# Patient Record
Sex: Male | Born: 1945
Health system: Southern US, Community
[De-identification: ages and names within clinical notes are randomized; demographics above are authoritative.]

## PROBLEM LIST (undated history)

## (undated) DIAGNOSIS — F419 Anxiety disorder, unspecified: Secondary | ICD-10-CM

## (undated) DIAGNOSIS — J45909 Unspecified asthma, uncomplicated: Secondary | ICD-10-CM

## (undated) DIAGNOSIS — H269 Unspecified cataract: Secondary | ICD-10-CM

## (undated) DIAGNOSIS — T7840XA Allergy, unspecified, initial encounter: Secondary | ICD-10-CM

## (undated) DIAGNOSIS — K219 Gastro-esophageal reflux disease without esophagitis: Secondary | ICD-10-CM

## (undated) DIAGNOSIS — M1990 Unspecified osteoarthritis, unspecified site: Secondary | ICD-10-CM

## (undated) DIAGNOSIS — E039 Hypothyroidism, unspecified: Secondary | ICD-10-CM

## (undated) DIAGNOSIS — I213 ST elevation (STEMI) myocardial infarction of unspecified site: Secondary | ICD-10-CM

## (undated) DIAGNOSIS — I2510 Atherosclerotic heart disease of native coronary artery without angina pectoris: Secondary | ICD-10-CM

## (undated) DIAGNOSIS — E785 Hyperlipidemia, unspecified: Secondary | ICD-10-CM

## (undated) HISTORY — DX: Gastro-esophageal reflux disease without esophagitis: K21.9

## (undated) HISTORY — DX: Unspecified cataract: H26.9

## (undated) HISTORY — PX: OTHER SURGICAL HISTORY: SHX169

## (undated) HISTORY — PX: UPPER GASTROINTESTINAL ENDOSCOPY: SHX188

## (undated) HISTORY — DX: Unspecified osteoarthritis, unspecified site: M19.90

## (undated) HISTORY — PX: POLYPECTOMY: SHX149

## (undated) HISTORY — DX: Anxiety disorder, unspecified: F41.9

## (undated) HISTORY — PX: EYE SURGERY: SHX253

## (undated) HISTORY — PX: SPINE SURGERY: SHX786

## (undated) HISTORY — DX: Unspecified asthma, uncomplicated: J45.909

## (undated) HISTORY — DX: Allergy, unspecified, initial encounter: T78.40XA

## (undated) HISTORY — DX: Hypothyroidism, unspecified: E03.9

## (undated) HISTORY — PX: TONSILLECTOMY: SUR1361

## (undated) HISTORY — PX: SHOULDER SURGERY: SHX246

---

## 2004-05-31 HISTORY — PX: COLONOSCOPY: SHX5424

## 2004-10-25 ENCOUNTER — Ambulatory Visit: Payer: Self-pay | Admitting: Family Medicine

## 2004-10-31 ENCOUNTER — Encounter: Admission: RE | Admit: 2004-10-31 | Discharge: 2004-10-31 | Payer: Self-pay | Admitting: Orthopaedic Surgery

## 2004-11-26 ENCOUNTER — Encounter: Admission: RE | Admit: 2004-11-26 | Discharge: 2005-02-18 | Payer: Self-pay | Admitting: Orthopaedic Surgery

## 2005-09-19 ENCOUNTER — Ambulatory Visit: Payer: Self-pay | Admitting: Family Medicine

## 2005-09-26 ENCOUNTER — Ambulatory Visit: Payer: Self-pay | Admitting: Family Medicine

## 2005-09-30 ENCOUNTER — Ambulatory Visit: Payer: Self-pay | Admitting: Family Medicine

## 2005-10-03 ENCOUNTER — Ambulatory Visit: Payer: Self-pay | Admitting: Family Medicine

## 2005-10-14 ENCOUNTER — Ambulatory Visit: Payer: Self-pay | Admitting: Gastroenterology

## 2005-10-22 ENCOUNTER — Encounter (INDEPENDENT_AMBULATORY_CARE_PROVIDER_SITE_OTHER): Payer: Self-pay | Admitting: *Deleted

## 2005-10-22 ENCOUNTER — Ambulatory Visit: Payer: Self-pay | Admitting: Gastroenterology

## 2005-11-20 ENCOUNTER — Ambulatory Visit: Payer: Self-pay | Admitting: Gastroenterology

## 2005-12-02 ENCOUNTER — Ambulatory Visit (HOSPITAL_COMMUNITY): Admission: RE | Admit: 2005-12-02 | Discharge: 2005-12-02 | Payer: Self-pay | Admitting: Gastroenterology

## 2005-12-16 ENCOUNTER — Ambulatory Visit: Payer: Self-pay | Admitting: Gastroenterology

## 2006-04-23 ENCOUNTER — Ambulatory Visit: Payer: Self-pay | Admitting: Gastroenterology

## 2007-03-11 DIAGNOSIS — E039 Hypothyroidism, unspecified: Secondary | ICD-10-CM | POA: Insufficient documentation

## 2007-03-11 DIAGNOSIS — F418 Other specified anxiety disorders: Secondary | ICD-10-CM | POA: Insufficient documentation

## 2007-03-11 DIAGNOSIS — F411 Generalized anxiety disorder: Secondary | ICD-10-CM

## 2007-03-11 DIAGNOSIS — F39 Unspecified mood [affective] disorder: Secondary | ICD-10-CM | POA: Insufficient documentation

## 2007-03-11 DIAGNOSIS — J309 Allergic rhinitis, unspecified: Secondary | ICD-10-CM | POA: Insufficient documentation

## 2007-03-24 ENCOUNTER — Ambulatory Visit: Payer: Self-pay | Admitting: Family Medicine

## 2007-03-24 LAB — CONVERTED CEMR LAB
ALT: 23 units/L (ref 0–53)
AST: 19 units/L (ref 0–37)
Alkaline Phosphatase: 71 units/L (ref 39–117)
Basophils Absolute: 0 10*3/uL (ref 0.0–0.1)
Basophils Relative: 0.6 % (ref 0.0–1.0)
Bilirubin Urine: NEGATIVE
CO2: 32 meq/L (ref 19–32)
Cholesterol: 172 mg/dL (ref 0–200)
Eosinophils Absolute: 0.1 10*3/uL (ref 0.0–0.6)
Eosinophils Relative: 2.4 % (ref 0.0–5.0)
GFR calc non Af Amer: 91 mL/min
HDL: 36.2 mg/dL — ABNORMAL LOW (ref >39.0)
Hemoglobin, Urine: NEGATIVE
Ketones, ur: NEGATIVE mg/dL
Leukocytes, UA: NEGATIVE
Lymphocytes Relative: 21.4 % (ref 12.0–46.0)
Monocytes Relative: 7.1 % (ref 3.0–11.0)
Neutro Abs: 3.5 10*3/uL (ref 1.4–7.7)
Nitrite: NEGATIVE
Platelets: 243 10*3/uL (ref 150–400)
Potassium: 4.3 meq/L (ref 3.5–5.1)
RDW: 12.2 % (ref 11.5–14.6)
TSH: 2.54 microintl units/mL (ref 0.35–5.50)
Total Bilirubin: 0.7 mg/dL (ref 0.3–1.2)
Total Protein, Urine: NEGATIVE mg/dL
Urine Glucose: NEGATIVE mg/dL
Urobilinogen, UA: 0.2 (ref 0.0–1.0)
VLDL: 15 mg/dL (ref 0–40)

## 2007-04-02 ENCOUNTER — Ambulatory Visit: Payer: Self-pay | Admitting: Family Medicine

## 2007-04-20 ENCOUNTER — Ambulatory Visit: Payer: Self-pay | Admitting: Family Medicine

## 2007-04-20 DIAGNOSIS — D235 Other benign neoplasm of skin of trunk: Secondary | ICD-10-CM

## 2007-04-20 DIAGNOSIS — C4490 Unspecified malignant neoplasm of skin, unspecified: Secondary | ICD-10-CM

## 2007-09-11 ENCOUNTER — Telehealth: Payer: Self-pay | Admitting: Family Medicine

## 2007-09-16 ENCOUNTER — Encounter: Payer: Self-pay | Admitting: Family Medicine

## 2008-01-04 ENCOUNTER — Encounter: Payer: Self-pay | Admitting: Family Medicine

## 2008-01-04 ENCOUNTER — Ambulatory Visit: Payer: Self-pay | Admitting: Family Medicine

## 2008-01-05 DIAGNOSIS — L03319 Cellulitis of trunk, unspecified: Secondary | ICD-10-CM

## 2008-01-05 DIAGNOSIS — L02219 Cutaneous abscess of trunk, unspecified: Secondary | ICD-10-CM

## 2008-01-09 ENCOUNTER — Telehealth: Payer: Self-pay | Admitting: Family Medicine

## 2008-02-11 ENCOUNTER — Telehealth: Payer: Self-pay | Admitting: Family Medicine

## 2008-06-23 ENCOUNTER — Telehealth: Payer: Self-pay | Admitting: Family Medicine

## 2009-02-14 ENCOUNTER — Ambulatory Visit: Payer: Self-pay | Admitting: Family Medicine

## 2009-02-14 LAB — CONVERTED CEMR LAB
Alkaline Phosphatase: 62 units/L (ref 39–117)
Bilirubin Urine: NEGATIVE
Calcium: 9.3 mg/dL (ref 8.4–10.5)
Chloride: 107 meq/L (ref 96–112)
Cholesterol: 189 mg/dL (ref 0–200)
Eosinophils Relative: 2.3 % (ref 0.0–5.0)
GFR calc non Af Amer: 79.87 mL/min (ref >60)
Glucose, Bld: 100 mg/dL — ABNORMAL HIGH (ref 70–99)
HCT: 41.8 % (ref 39.0–52.0)
Hemoglobin, Urine: NEGATIVE
Ketones, ur: NEGATIVE mg/dL
LDL Cholesterol: 129 mg/dL — ABNORMAL HIGH (ref 0–99)
Lymphocytes Relative: 24.8 % (ref 12.0–46.0)
Lymphs Abs: 1.3 10*3/uL (ref 0.7–4.0)
MCHC: 34.8 g/dL (ref 30.0–36.0)
Monocytes Absolute: 0.4 10*3/uL (ref 0.1–1.0)
Neutro Abs: 3.5 10*3/uL (ref 1.4–7.7)
Platelets: 220 10*3/uL (ref 150.0–400.0)
Potassium: 4.4 meq/L (ref 3.5–5.1)
Specific Gravity, Urine: 1.02 (ref 1.000–1.030)
Total Bilirubin: 0.6 mg/dL (ref 0.3–1.2)
Total Protein, Urine: NEGATIVE mg/dL
Triglycerides: 83 mg/dL (ref 0.0–149.0)
Urine Glucose: NEGATIVE mg/dL
Urobilinogen, UA: 0.2 (ref 0.0–1.0)
VLDL: 16.6 mg/dL (ref 0.0–40.0)
pH: 6 (ref 5.0–8.0)

## 2009-02-21 ENCOUNTER — Ambulatory Visit: Payer: Self-pay | Admitting: Family Medicine

## 2009-03-28 ENCOUNTER — Ambulatory Visit: Payer: Self-pay | Admitting: Family Medicine

## 2009-04-18 DIAGNOSIS — L820 Inflamed seborrheic keratosis: Secondary | ICD-10-CM

## 2009-05-12 ENCOUNTER — Telehealth: Payer: Self-pay | Admitting: Family Medicine

## 2009-07-05 ENCOUNTER — Encounter: Payer: Self-pay | Admitting: Family Medicine

## 2009-09-26 ENCOUNTER — Ambulatory Visit: Payer: Self-pay | Admitting: Family Medicine

## 2009-09-26 DIAGNOSIS — J45909 Unspecified asthma, uncomplicated: Secondary | ICD-10-CM | POA: Insufficient documentation

## 2009-10-05 ENCOUNTER — Telehealth: Payer: Self-pay | Admitting: Family Medicine

## 2009-11-03 ENCOUNTER — Ambulatory Visit: Payer: Self-pay | Admitting: Family Medicine

## 2009-11-03 ENCOUNTER — Telehealth: Payer: Self-pay | Admitting: Family Medicine

## 2009-11-09 ENCOUNTER — Ambulatory Visit: Payer: Self-pay | Admitting: Family Medicine

## 2009-11-09 DIAGNOSIS — K219 Gastro-esophageal reflux disease without esophagitis: Secondary | ICD-10-CM | POA: Insufficient documentation

## 2009-12-27 ENCOUNTER — Telehealth: Payer: Self-pay | Admitting: Family Medicine

## 2010-01-24 ENCOUNTER — Ambulatory Visit: Payer: Self-pay | Admitting: Family Medicine

## 2010-01-25 ENCOUNTER — Ambulatory Visit: Payer: Self-pay | Admitting: Family Medicine

## 2010-02-02 ENCOUNTER — Telehealth: Payer: Self-pay | Admitting: Family Medicine

## 2010-03-20 ENCOUNTER — Telehealth: Payer: Self-pay | Admitting: Family Medicine

## 2010-09-02 LAB — CONVERTED CEMR LAB
AST: 24 units/L (ref 0–37)
BUN: 21 mg/dL (ref 6–23)
Creatinine, Ser: 0.9 mg/dL (ref 0.4–1.5)
Eosinophils Absolute: 0.1 10*3/uL (ref 0.0–0.7)
Eosinophils Relative: 2.8 % (ref 0.0–5.0)
HDL: 41.9 mg/dL (ref >39.00)
LDL Cholesterol: 118 mg/dL — ABNORMAL HIGH (ref 0–99)
Lymphocytes Relative: 22.2 % (ref 12.0–46.0)
Lymphs Abs: 1.2 10*3/uL (ref 0.7–4.0)
MCHC: 34.3 g/dL (ref 30.0–36.0)
Monocytes Absolute: 0.4 10*3/uL (ref 0.1–1.0)
Monocytes Relative: 7.6 % (ref 3.0–12.0)
Neutrophils Relative %: 66.5 % (ref 43.0–77.0)
PSA: 1.42 ng/mL (ref 0.10–4.00)
Platelets: 248 10*3/uL (ref 150.0–400.0)
Potassium: 4.8 meq/L (ref 3.5–5.1)
Sodium: 143 meq/L (ref 135–145)
TSH: 2.94 microintl units/mL (ref 0.35–5.50)
Total Bilirubin: 0.8 mg/dL (ref 0.3–1.2)
Total Protein: 6.6 g/dL (ref 6.0–8.3)
Urobilinogen, UA: 0.2
VLDL: 15 mg/dL (ref 0.0–40.0)
WBC Urine, dipstick: NEGATIVE
WBC: 5.2 10*3/uL (ref 4.5–10.5)
pH: 6.5

## 2010-09-04 NOTE — Progress Notes (Signed)
Summary: FYI to Dr Tawanna Cooler  Phone Note Call from Patient Call back at [facility] Phone [phone]   Caller: Spouse----LIVE CALL Reason for Call: Talk to Doctor Summary of Call: Was seen 2 weeks ago and was given 3 rxs. The same 3 were sent to [facility]. [facility] mailed the rxs to the patient and now [facility] will not take them back. Wife said that they are stuck with extra meds and the extra charges. She just wanted to let Dr Tawanna Cooler know about this. Any ?, please call the wife. Thanks. Initial call taken by: Warnell Forester,  October 05, 2009 10:34 AM  Follow-up for Phone Call        [facility] please call [name]......... not the wife..........Marland Kitchen medications have a shelf life for one year plus okay to hold them.  He may need in the future Follow-up by: Roderick Pee MD,  October 05, 2009 12:47 PM  Additional Follow-up for Phone Call Additional follow up Details #1::        spoke with patient and he is aware Additional Follow-up by: Kern Reap CMA Duncan Dull),  October 05, 2009 5:10 PM

## 2010-09-04 NOTE — Assessment & Plan Note (Signed)
Summary: cough/cb   Vital Signs:  Patient profile:   65 year old male Weight:      223 pounds Temp:     98.7 degrees F oral BP sitting:   110 / 80  (left arm) Cuff size:   regular  Vitals Entered By: Kern Reap CMA Duncan Dull) (November 03, 2009 12:49 PM) CC: chest congestion   CC:  chest congestion.  History of Present Illness: Ryan [name] is a 65 year old male, who comes in today for reevaluation of asthma.  We saw him 6 weeks ago with a flare of his asthma.  At that time.  He also has some discolored sputum.  We started him on doxycycline 100 mg b.i.d., prednisone, 40 mg daily x 3 days with the taper and he markedly improved.  However, the cough has never gone completely away.  He has no fever, chills, sputum production, shortness of breath, et Karie Soda.  He also states he thinks his reflexes gotten worse.  He took 14 days and OTC Prilosec and is reflux got better, but it didn't seem to help the cough.  He states when he lies down at night.  It makes her cough worse therefore, there may be an element of reflux-induced cough.  Review of systems otherwise negative  Allergies: 1)  ! Penicillin 2)  ! * Ivp Dye 3)  ! Erythromycin  Past History:  Past medical, surgical, family and social histories (including risk factors) reviewed for relevance to current acute and chronic problems.  Past Medical History: Reviewed history from 04/02/2007 and no changes required. Allergic rhinitis Anxiety Hypothyroidism  Past Surgical History: Reviewed history from 03/11/2007 and no changes required. EDG-10/22/2005 Colonoscopy-05/31/2004  Family History: Reviewed history from 03/11/2007 and no changes required. Family History Diabetes 1st degree relative Family History Lung cancer Fam hx CHF  Social History: Reviewed history from 04/02/2007 and no changes required. Married Regular exercise-yes  Review of Systems      See HPI  Physical Exam  General:  Well-developed,well-nourished,in no  acute distress; alert,appropriate and cooperative throughout examination Head:  Normocephalic and atraumatic without obvious abnormalities. No apparent alopecia or balding. Eyes:  No corneal or conjunctival inflammation noted. EOMI. Perrla. Funduscopic exam benign, without hemorrhages, exudates or papilledema. Vision grossly normal. Ears:  External ear exam shows no significant lesions or deformities.  Otoscopic examination reveals clear canals, tympanic membranes are intact bilaterally without bulging, retraction, inflammation or discharge. Hearing is grossly normal bilaterally. Nose:  External nasal examination shows no deformity or inflammation. Nasal mucosa are pink and moist without lesions or exudates. Mouth:  Oral mucosa and oropharynx without lesions or exudates.  Teeth in good repair. Neck:  No deformities, masses, or tenderness noted. Lungs:  symmetrical breath sounds, late expiratory 1+ wheezing   Impression & Recommendations:  Problem # 1:  ASTHMA (ICD-493.90) Assessment Unchanged  His updated medication list for this problem includes:    Prednisone 20 Mg Tabs (Prednisone) ..... Uad    Qvar 40 Mcg/act Aers (Beclomethasone dipropionate) .Marland Kitchen... 2 ps two times a day  Orders: T-2 View CXR (71020TC)  Complete Medication List: 1)  Synthroid 88 Mcg Tabs (Levothyroxine sodium) .... Take 1 tablet by mouth once a day 2)  Aspirin 325 Mg Tabs (Aspirin) .... Take 1 tablet by mouth once a day 3)  Prednisone 20 Mg Tabs (Prednisone) .... Uad 4)  Doxycycline Hyclate 100 Mg Caps (Doxycycline hyclate) .... Take 1 tablet by mouth two times a day 5)  Qvar 40 Mcg/act Aers (Beclomethasone dipropionate) .Marland KitchenMarland KitchenMarland Kitchen  2 ps two times a day  Patient Instructions: 1)  take OTC Prilosec, one twice daily, nothing to eat or drink for 3 hours before bedtime, except water. 2)  Begin Qvar 2 puffs twice daily........ swish and spit with mouthwash after a use the inhaled steroid. 3)  Go  to the main office now for a  chest x-ray 4)  Return next Thursday for follow-up Prescriptions: QVAR 40 MCG/ACT AERS (BECLOMETHASONE DIPROPIONATE) 2 ps two times a day  #1 x 2   Entered and Authorized by:   Roderick Pee MD   Signed by:   Roderick Pee MD on 11/03/2009   Method used:   Print then Give to Patient   RxID:   [id number] QVAR 40 MCG/ACT AERS (BECLOMETHASONE DIPROPIONATE) 2 ps two times a day  #1 x 2   Entered and Authorized by:   Roderick Pee MD   Signed by:   Roderick Pee MD on 11/03/2009   Method used:   Electronically to        [facility]* (mail-order)             ,          Ph: [phone]       Fax: [phone]   RxID:   [id number]

## 2010-09-04 NOTE — Progress Notes (Signed)
Summary: shingles shot call  Phone Note Outgoing Call   Call placed by: Duard Brady LPN,  March 20, 2010 2:54 PM Call placed to: Patient Summary of Call: spoke with pt r/t shingles vaccice - need to check with ins co about covering the $290 cost. Will have to sign a wavier of liability. call by thursday 5pm to let us know. If we dont hear from him , will remove from list. KIK Initial call taken by: Duard Brady LPN,  March 20, 2010 2:56 PM  Follow-up for Phone Call        pt write out rx for the shingles vax. pt will pick up when ready. Follow-up by: Warnell Forester,  March 20, 2010 4:38 PM    New/Updated Medications: ZOSTAVAX 57846 UNT/0.65ML SOLR (ZOSTER VACCINE LIVE) use as directed Prescriptions: ZOSTAVAX 96295 UNT/0.65ML SOLR (ZOSTER VACCINE LIVE) use as directed  #1 x 0   Entered by:   Kern Reap CMA (AAMA)   Authorized by:   Roderick Pee MD   Signed by:   Kern Reap CMA (AAMA) on 03/21/2010   Method used:   Print then Give to Patient   RxID:   [id number]

## 2010-09-04 NOTE — Miscellaneous (Signed)
Summary: Consent for Mole Removal  Consent for Mole Removal   Imported By: Maryln Gottron 01/29/2010 13:36:26  _____________________________________________________________________  External Attachment:    Type:   Image     Comment:   External Document

## 2010-09-04 NOTE — Assessment & Plan Note (Signed)
Summary: CONGESTION // RS   Vital Signs:  Patient profile:   65 year old male Weight:      226 pounds Temp:     100.7 degrees F oral BP sitting:   142 / 92  (left arm) Cuff size:   regular  Vitals Entered By: Kern Reap CMA Duncan Dull) (September 26, 2009 12:02 PM)  Reason for Visit chest congestion, cough, chills  History of Present Illness: Ryan [name] is a 65 year old, married male, nonsmoker retired x 3 years, but has been around sick grandchildren two weeks ago, who comes in with a 3 day history of head congestion, sore throat, and cough.  Today, he started running fever.  He also feels like he is wheezing.  He said history of asthma in the past when he gets a bad cold.  Allergies: 1)  ! Penicillin 2)  ! * Ivp Dye 3)  ! Erythromycin  Past History:  Past medical, surgical, family and social histories (including risk factors) reviewed for relevance to current acute and chronic problems.  Past Medical History: Reviewed history from 04/02/2007 and no changes required. Allergic rhinitis Anxiety Hypothyroidism  Past Surgical History: Reviewed history from 03/11/2007 and no changes required. EDG-10/22/2005 Colonoscopy-05/31/2004  Family History: Reviewed history from 03/11/2007 and no changes required. Family History Diabetes 1st degree relative Family History Lung cancer Fam hx CHF  Social History: Reviewed history from 04/02/2007 and no changes required. Married Regular exercise-yes  Review of Systems      See HPI  Physical Exam  General:  Well-developed,well-nourished,in no acute distress; alert,appropriate and cooperative throughout examination Head:  Normocephalic and atraumatic without obvious abnormalities. No apparent alopecia or balding. Eyes:  No corneal or conjunctival inflammation noted. EOMI. Perrla. Funduscopic exam benign, without hemorrhages, exudates or papilledema. Vision grossly normal. Ears:  External ear exam shows no significant lesions or  deformities.  Otoscopic examination reveals clear canals, tympanic membranes are intact bilaterally without bulging, retraction, inflammation or discharge. Hearing is grossly normal bilaterally. Nose:  External nasal examination shows no deformity or inflammation. Nasal mucosa are pink and moist without lesions or exudates. Mouth:  Oral mucosa and oropharynx without lesions or exudates.  Teeth in good repair. Neck:  No deformities, masses, or tenderness noted. Chest Wall:  No deformities, masses, tenderness or gynecomastia noted. Lungs:  bilateral wheezing, symmetrical.  Breath sounds.  No crackles   Problems:  Medical Problems Added: 1)  Dx of Asthma  (ICD-493.90) 2)  Dx of Viral Infection-unspec  (ICD-079.99)  Impression & Recommendations:  Problem # 1:  ASTHMA (ICD-493.90) Assessment New  His updated medication list for this problem includes:    Prednisone 20 Mg Tabs (Prednisone) ..... Uad  Problem # 2:  VIRAL INFECTION-UNSPEC (ICD-079.99) Assessment: New  His updated medication list for this problem includes:    Aspirin 325 Mg Tabs (Aspirin) .Marland Kitchen... Take 1 tablet by mouth once a day    Hydromet 5-1.5 Mg/36ml Syrp (Hydrocodone-homatropine) .Marland Kitchen... 1 or 2 tsps three times a day as needed  Complete Medication List: 1)  Synthroid 88 Mcg Tabs (Levothyroxine sodium) .... Take 1 tablet by mouth once a day 2)  Aspirin 325 Mg Tabs (Aspirin) .... Take 1 tablet by mouth once a day 3)  Prednisone 20 Mg Tabs (Prednisone) .... Uad 4)  Doxycycline Hyclate 100 Mg Caps (Doxycycline hyclate) .... Take 1 tablet by mouth two times a day 5)  Hydromet 5-1.5 Mg/53ml Syrp (Hydrocodone-homatropine) .Marland Kitchen.. 1 or 2 tsps three times a day as needed  Patient Instructions: 1)  rest at home, drink, 30 ounces of water daily, run a vaporizer or humidifier in y  bedroom, take one or 2 teaspoons of Hydromet 3 times a day as needed for cough.  Begin doxycycline 100 mg b.i.d. and prednisone two tabs x 3 days, one x 3  days, half x 3 days, then half a tablet Monday, Wednesday, Friday, for a two-week taper.  Return p.r.n. Prescriptions: DOXYCYCLINE HYCLATE 100 MG CAPS (DOXYCYCLINE HYCLATE) Take 1 tablet by mouth two times a day  #20 x 0   Entered and Authorized by:   Roderick Pee MD   Signed by:   Roderick Pee MD on 09/26/2009   Method used:   Print then Give to Patient   RxID:   [id number] PREDNISONE 20 MG TABS (PREDNISONE) UAD  #30 x 0   Entered and Authorized by:   Roderick Pee MD   Signed by:   Roderick Pee MD on 09/26/2009   Method used:   Print then Give to Patient   RxID:   [id number] HYDROMET 5-1.5 MG/5ML SYRP (HYDROCODONE-HOMATROPINE) 1 or 2 tsps three times a day as needed  #8oz x 1   Entered and Authorized by:   Roderick Pee MD   Signed by:   Roderick Pee MD on 09/26/2009   Method used:   Print then Give to Patient   RxID:   [id number] DOXYCYCLINE HYCLATE 100 MG CAPS (DOXYCYCLINE HYCLATE) Take 1 tablet by mouth two times a day  #20 x 0   Entered and Authorized by:   Roderick Pee MD   Signed by:   Roderick Pee MD on 09/26/2009   Method used:   Electronically to        [facility]* (mail-order)             ,          Ph: [phone]       Fax: [phone]   RxID:   [id number] PREDNISONE 20 MG TABS (PREDNISONE) UAD  #30 x 0   Entered and Authorized by:   Roderick Pee MD   Signed by:   Roderick Pee MD on 09/26/2009   Method used:   Electronically to        [facility]* (mail-order)             ,          Ph: [phone]       Fax: [phone]   RxID:   [id number]

## 2010-09-04 NOTE — Assessment & Plan Note (Signed)
Summary: mole removal ok per doc/njr   Procedure Note Last Tetanus: Td (08/05/2005)  Mole Biopsy/Removal: Indication: suspicious lesion Consent signed: yes  Procedure # 1: elliptical incision with 2 mm margin    Size (in cm): 0.8 x 0.8    Region: anterior    Location: leg-lower-right    Instrument used: #15 blade    Anesthesia: 1% lidocaine w/epinephrine    Closure: cautery   Allergies: 1)  ! Penicillin 2)  ! * Ivp Dye 3)  ! Erythromycin   Complete Medication List: 1)  Synthroid 88 Mcg Tabs (Levothyroxine sodium) .... Take 1 tablet by mouth once a day 2)  Aspirin 325 Mg Tabs (Aspirin) .... Take 1 tablet by mouth once a day 3)  Prednisone 20 Mg Tabs (Prednisone) .... Uad 4)  Doxycycline Hyclate 100 Mg Caps (Doxycycline hyclate) .... Take 1 tablet by mouth two times a day 5)  Qvar 40 Mcg/act Aers (Beclomethasone dipropionate) .... 2 ps two times a day  Other Orders: Excise Malig lesion ([facility]) 0.6 - 1.0 cm (74259)

## 2010-09-04 NOTE — Progress Notes (Signed)
  Phone Note Outgoing Call   Summary of Call: called patient chest x-ray.  He normal except for peribronchial thickening consistent with his wheezing Initial call taken by: Roderick Pee MD,  November 03, 2009 5:34 PM

## 2010-09-04 NOTE — Assessment & Plan Note (Signed)
Summary: 6 day rov/njr   Vital Signs:  Patient profile:   65 year old male Weight:      220 pounds Temp:     98.5 degrees F oral BP sitting:   110 / 70  (left arm) Cuff size:   regular  Vitals Entered By: Kern Reap CMA Duncan Dull) (November 09, 2009 10:45 AM) CC: follow-up visit   CC:  follow-up visit.  History of Present Illness: Ryan [name] is a 65 year old, married male, nonsmoker, who comes in today for re-evaluation of asthma.  We had seen him about 6 weeks ago with a flare of his asthma.  We put him on oral prednisone, but it did not resolve his symptoms.  We switched to the inhaled steroid Qvar two puffs b.i.d. and started the anti-reflux program with Prilosec 20 mg b.i.d. and the other dietary restrictions.  He now says his symptoms are about 75% improved.  I therefore think a big part of his asthma with reflux.  He says on occasion.  He said a sensation of food getting stuck in his esophagus.  Allergies: 1)  ! Penicillin 2)  ! * Ivp Dye 3)  ! Erythromycin  Past History:  Past medical, surgical, family and social histories (including risk factors) reviewed for relevance to current acute and chronic problems.  Past Medical History: Reviewed history from 04/02/2007 and no changes required. Allergic rhinitis Anxiety Hypothyroidism  Past Surgical History: Reviewed history from 03/11/2007 and no changes required. EDG-10/22/2005 Colonoscopy-05/31/2004  Family History: Reviewed history from 03/11/2007 and no changes required. Family History Diabetes 1st degree relative Family History Lung cancer Fam hx CHF  Social History: Reviewed history from 04/02/2007 and no changes required. Married Regular exercise-yes  Review of Systems      See HPI  Physical Exam  General:  Well-developed,well-nourished,in no acute distress; alert,appropriate and cooperative throughout examination Head:  Normocephalic and atraumatic without obvious abnormalities. No apparent alopecia or  balding. Eyes:  No corneal or conjunctival inflammation noted. EOMI. Perrla. Funduscopic exam benign, without hemorrhages, exudates or papilledema. Vision grossly normal. Ears:  External ear exam shows no significant lesions or deformities.  Otoscopic examination reveals clear canals, tympanic membranes are intact bilaterally without bulging, retraction, inflammation or discharge. Hearing is grossly normal bilaterally. Nose:  External nasal examination shows no deformity or inflammation. Nasal mucosa are pink and moist without lesions or exudates. Mouth:  Oral mucosa and oropharynx without lesions or exudates.  Teeth in good repair. Lungs:  symmetrical breath sounds a very faint expiratory wheeze   Problems:  Medical Problems Added: 1)  Dx of Gerd  (ICD-530.81)  Impression & Recommendations:  Problem # 1:  ASTHMA (ICD-493.90) Assessment Improved  His updated medication list for this problem includes:    Prednisone 20 Mg Tabs (Prednisone) ..... Uad    Qvar 40 Mcg/act Aers (Beclomethasone dipropionate) .Marland Kitchen... 2 ps two times a day  Problem # 2:  GERD (ICD-530.81) Assessment: Improved  Complete Medication List: 1)  Synthroid 88 Mcg Tabs (Levothyroxine sodium) .... Take 1 tablet by mouth once a day 2)  Aspirin 325 Mg Tabs (Aspirin) .... Take 1 tablet by mouth once a day 3)  Prednisone 20 Mg Tabs (Prednisone) .... Uad 4)  Doxycycline Hyclate 100 Mg Caps (Doxycycline hyclate) .... Take 1 tablet by mouth two times a day 5)  Qvar 40 Mcg/act Aers (Beclomethasone dipropionate) .... 2 ps two times a day  Patient Instructions: 1)  continue the anti-reflux program. 2)  Use one puff of Qvar twice  a day for 3 weeks and stop. 3)  Call GI phone number [phone] request a consult from your gastroenterologist, question endoscopy

## 2010-09-04 NOTE — Progress Notes (Signed)
Summary: REFILL REQUEST (Synthroid)  Phone Note Refill Request Message from:  Patient's wife Eber Jones)  Refills Requested: Medication #1:  SYNTHROID 88 MCG  TABS Take 1 tablet by mouth once a day   Notes: [facility] [facility]   Pts wife adv that they have recently gone on Medicare and switched to [facility] (Pharmacy).... Pts wife is requesting that a new Rx for med (Synthroid) be sent to pharmacy.   Initial call taken by: Debbra Riding,  Dec 27, 2009 11:51 AM  Follow-up for Phone Call        Phone Call Completed----Pts wife was supposed to c/b with fax # for [facility].... However, no one called back to adv what fax # Rx needed to be sent to..... Rx has been prepared and will be left up front for pt to p/u.... Msg left at pts home # advising same.  Follow-up by: Debbra Riding,  Dec 28, 2009 10:34 AM    Prescriptions: SYNTHROID 88 MCG  TABS (LEVOTHYROXINE SODIUM) Take 1 tablet by mouth once a day  #90 Tablet x 3   Entered by:   Kern Reap CMA (AAMA)   Authorized by:   Roderick Pee MD   Signed by:   Kern Reap CMA (AAMA) on 12/28/2009   Method used:   Printed then faxed to ...       [facility]* (mail-order)             ,          Ph: [phone]       Fax: [phone]   RxID:   [id number]   Appended Document: REFILL REQUEST (Synthroid) Pts wife called back to advise that the fax # for [facility] is:  [phone]....Marland KitchenMarland KitchenPts ID# is  [id number].Marland Kitchen... Shipping address: [id number], [place], [place]  [address].  Appended Document: REFILL REQUEST (Synthroid) Faxed Rx to # provided by pts wife.....confirmation received.

## 2010-09-04 NOTE — Assessment & Plan Note (Signed)
Summary: emp--will fast//ccm   Vital Signs:  Patient profile:   65 year old male Height:      72.5 inches Weight:      209 pounds BMI:     28.06 Temp:     98.1 degrees F oral BP sitting:   120 / 84  (left arm) Cuff size:   regular  Vitals Entered By: Kern Reap CMA Duncan Dull) (January 24, 2010 9:15 AM) CC: cpx  Vision Screening:Left eye w/o correction: 20 / 25 Right Eye w/o correction: 20 / 20 Both eyes w/o correction:  20/ 20        Vision Entered By: Kern Reap CMA Duncan Dull) (January 24, 2010 9:20 AM)   CC:  cpx.  History of Present Illness: Ryan [name] is a 65 year old, married male, nonsmoker, who comes in today for evaluation of hypothyroidism, reflux esophagitis, and asthma, and general physical exam.  His hypothyroidism is treated with Synthroid 88 micrograms daily.  Will check TSH level today.  His asthma is treated withQvar 42 puffs b.i.d. however, is not taking his medication.  He feels well  he also takes Prilosec 20 mg OTC p.r.n. for reflux esophagitis and asymptomatic.  He gets routine eye care.  Dental care.  Colonoscopy normal when GI tetanus 2007 seasonal flu 2010 Pneumovax today.  Information given on shingles Here for Medicare AWV:  1.   Risk factors based on Past M, S, F history:..........Marland Kitchenreviewed no changes except is retired and they lived mostly at [employer] 2.   Physical Activities: ..walks daily 3.   Depression/mood: .........mood good.  No depression 4.   Hearing: .......Marland Kitchenhearing normal 5.   ADL's: .........Marland KitchenADLs normal.  No change 6.   Fall Risk: ..........none 7.   Home Safety: .............Marland Kitchenreviewed none 8.   Height, weight, &visual acuity:........Marland Kitchenheight weight, normal.  Vision 2020 bilaterally 9.   Counseling: ..........to continue his current medication and be sure there were sunscreen since he as exposed to a lot of sun damage 10.   Labs ordered based on risk factors: ..........done 11.           Referral  Coordination....................none 12.           Care Plan.......Marland Kitchenreviewed 13.            Cognitive Assessment ..........normal  Allergies: 1)  ! Penicillin 2)  ! * Ivp Dye 3)  ! Erythromycin  Past History:  Past medical, surgical, family and social histories (including risk factors) reviewed, and no changes noted (except as noted below).  Past Medical History: Reviewed history from 04/02/2007 and no changes required. Allergic rhinitis Anxiety Hypothyroidism  Past Surgical History: Reviewed history from 03/11/2007 and no changes required. EDG-10/22/2005 Colonoscopy-05/31/2004  Family History: Reviewed history from 03/11/2007 and no changes required. Family History Diabetes 1st degree relative Family History Lung cancer Fam hx CHF  Social History: Reviewed history from 04/02/2007 and no changes required. Married Regular exercise-yes  Review of Systems      See HPI  Physical Exam  General:  Well-developed,well-nourished,in no acute distress; alert,appropriate and cooperative throughout examination Head:  Normocephalic and atraumatic without obvious abnormalities. No apparent alopecia or balding. Eyes:  No corneal or conjunctival inflammation noted. EOMI. Perrla. Funduscopic exam benign, without hemorrhages, exudates or papilledema. Vision grossly normal. Ears:  External ear exam shows no significant lesions or deformities.  Otoscopic examination reveals clear canals, tympanic membranes are intact bilaterally without bulging, retraction, inflammation or discharge. Hearing is grossly normal bilaterally. Nose:  External nasal examination shows no deformity or inflammation.  Nasal mucosa are pink and moist without lesions or exudates. Mouth:  Oral mucosa and oropharynx without lesions or exudates.  Teeth in good repair. Neck:  No deformities, masses, or tenderness noted. Chest Wall:  No deformities, masses, tenderness or gynecomastia noted. Breasts:  No masses or  gynecomastia noted Lungs:  Normal respiratory effort, chest expands symmetrically. Lungs are clear to auscultation, no crackles or wheezes. Heart:  Normal rate and regular rhythm. S1 and S2 normal without gallop, murmur, click, rub or other extra sounds. Abdomen:  Bowel sounds positive,abdomen soft and non-tender without masses, organomegaly or hernias noted. Rectal:  No external abnormalities noted. Normal sphincter tone. No rectal masses or tenderness. Genitalia:  Testes bilaterally descended without nodularity, tenderness or masses. No scrotal masses or lesions. No penis lesions or urethral discharge. Prostate:  Prostate gland firm and smooth, no enlargement, nodularity, tenderness, mass, asymmetry or induration. Msk:  No deformity or scoliosis noted of thoracic or lumbar spine.   Pulses:  R and L carotid,radial,femoral,dorsalis pedis and posterior tibial pulses are full and equal bilaterally Extremities:  No clubbing, cyanosis, edema, or deformity noted with normal full range of motion of all joints.   Neurologic:  No cranial nerve deficits noted. Station and gait are normal. Plantar reflexes are down-going bilaterally. DTRs are symmetrical throughout. Sensory, motor and coordinative functions appear intact. Skin:  total body skin exam normal except for a red, irritated lesion right lower extremity .Marland Kitchen..remove ASAP Cervical Nodes:  No lymphadenopathy noted Axillary Nodes:  No palpable lymphadenopathy Inguinal Nodes:  No significant adenopathy Psych:  Cognition and judgment appear intact. Alert and cooperative with normal attention span and concentration. No apparent delusions, illusions, hallucinations   Impression & Recommendations:  Problem # 1:  ROUTINE GENERAL MEDICAL EXAM@HEALTH  CARE FACL (ICD-V70.0) Assessment Unchanged  Orders: Prescription Created Electronically [phone]) First annual wellness visit with prevention plan  (U0454) UA Dipstick w/o Micro (automated)   (81003) Venipuncture (09811) EKG w/ Interpretation (93000) TLB-Lipid Panel (80061-LIPID) TLB-BMP (Basic Metabolic Panel-BMET) (80048-METABOL) TLB-CBC Platelet - w/Differential (85025-CBCD) TLB-Hepatic/Liver Function Pnl (80076-HEPATIC) TLB-TSH (Thyroid Stimulating Hormone) (84443-TSH) TLB-PSA (Prostate Specific Antigen) (84153-PSA)  Problem # 2:  GERD (ICD-530.81) Assessment: Improved  Orders: Prescription Created Electronically [id number]) First annual wellness visit with prevention plan  (G9562) UA Dipstick w/o Micro (automated)  (81003) Venipuncture (13086) EKG w/ Interpretation (93000) TLB-Lipid Panel (80061-LIPID) TLB-BMP (Basic Metabolic Panel-BMET) (80048-METABOL) TLB-CBC Platelet - w/Differential (85025-CBCD) TLB-Hepatic/Liver Function Pnl (80076-HEPATIC) TLB-TSH (Thyroid Stimulating Hormone) (84443-TSH) TLB-PSA (Prostate Specific Antigen) (84153-PSA)  Problem # 3:  ASTHMA (ICD-493.90) Assessment: Improved  His updated medication list for this problem includes:    Prednisone 20 Mg Tabs (Prednisone) ..... Uad    Qvar 40 Mcg/act Aers (Beclomethasone dipropionate) .Marland Kitchen... 2 ps two times a day  Orders: Prescription Created Electronically [phone]) First annual wellness visit with prevention plan  (N6295) UA Dipstick w/o Micro (automated)  (81003) Venipuncture (28413) TLB-Lipid Panel (80061-LIPID) TLB-BMP (Basic Metabolic Panel-BMET) (80048-METABOL) TLB-CBC Platelet - w/Differential (85025-CBCD) TLB-Hepatic/Liver Function Pnl (80076-HEPATIC) TLB-TSH (Thyroid Stimulating Hormone) (84443-TSH) TLB-PSA (Prostate Specific Antigen) (84153-PSA)  Problem # 4:  HYPOTHYROIDISM (ICD-244.9) Assessment: Improved  His updated medication list for this problem includes:    Synthroid 88 Mcg Tabs (Levothyroxine sodium) .Marland Kitchen... Take 1 tablet by mouth once a day  Orders: Prescription Created Electronically [phone]) First annual wellness visit with prevention plan  (U2725) UA Dipstick w/o  Micro (automated)  (81003) Venipuncture (36644) EKG w/ Interpretation (93000) TLB-Lipid Panel (80061-LIPID) TLB-BMP (Basic Metabolic Panel-BMET) (80048-METABOL) TLB-CBC Platelet - w/Differential (85025-CBCD) TLB-Hepatic/Liver  Function Pnl (80076-HEPATIC) TLB-TSH (Thyroid Stimulating Hormone) (84443-TSH) TLB-PSA (Prostate Specific Antigen) (84153-PSA)  Complete Medication List: 1)  Synthroid 88 Mcg Tabs (Levothyroxine sodium) .... Take 1 tablet by mouth once a day 2)  Aspirin 325 Mg Tabs (Aspirin) .... Take 1 tablet by mouth once a day 3)  Prednisone 20 Mg Tabs (Prednisone) .... Uad 4)  Doxycycline Hyclate 100 Mg Caps (Doxycycline hyclate) .... Take 1 tablet by mouth two times a day 5)  Qvar 40 Mcg/act Aers (Beclomethasone dipropionate) .... 2 ps two times a day  Other Orders: Pneumococcal Vaccine (16109) Admin 1st Vaccine (60454)  Patient Instructions: 1)  return Thursday afternoon for removal of the lesion on the right lower extremity .......... 4:30 2)  Please schedule a follow-up appointment in 1 year. 3)  Schedule a colonoscopy/sigmoidoscopy to help detect colon cancer. 4)  Take an Aspirin every day. Prescriptions: SYNTHROID 88 MCG  TABS (LEVOTHYROXINE SODIUM) Take 1 tablet by mouth once a day  #100 x 3   Entered and Authorized by:   Roderick Pee MD   Signed by:   Roderick Pee MD on 01/24/2010   Method used:   Print then Give to Patient   RxID:   [id number]    Immunization History:  Influenza Immunization History:    Influenza:  historical (05/05/2009)  Immunizations Administered:  Pneumonia Vaccine:    Vaccine Type: Pneumovax    Site: left deltoid    Mfr: Merck    Dose: 0.5 ml    Route: IM    Given by: Kern Reap CMA (AAMA)    Exp. Date: 05/30/2011    Lot #: [id number]    Physician counseled: yes   Laboratory Results   Urine Tests  Date/Time Recieved: January 24, 2010 11:03 AM  Date/Time Reported: January 24, 2010 11:03 AM   Routine Urinalysis    Color: yellow Appearance: Clear Glucose: negative   (Normal Range: Negative) Bilirubin: negative   (Normal Range: Negative) Ketone: negative   (Normal Range: Negative) Spec. Gravity: 1.025   (Normal Range: 1.003-1.035) Blood: negative   (Normal Range: Negative) pH: 6.5   (Normal Range: 5.0-8.0) Protein: trace   (Normal Range: Negative) Urobilinogen: 0.2   (Normal Range: 0-1) Nitrite: negative   (Normal Range: Negative) Leukocyte Esterace: negative   (Normal Range: Negative)    Comments: Wynona Canes, CMA  January 24, 2010 11:03 AM

## 2010-09-04 NOTE — Progress Notes (Signed)
Summary: Pathology results  Phone Note Call from Patient   Caller: Patient Call For: Roderick Pee MD Summary of Call: Please call pt. regarding pathology results. [phone] Initial call taken by: Lynann Beaver CMA,  February 02, 2010 1:45 PM  Follow-up for Phone Call        spoke with patient  Follow-up by: Kern Reap CMA Duncan Dull),  February 07, 2010 1:08 PM  Additional Follow-up for Phone Call Additional follow up Details #1::        scc ref to bob goodrich Additional Follow-up by: Roderick Pee MD,  February 08, 2010 10:17 AM

## 2010-12-07 ENCOUNTER — Telehealth: Payer: Self-pay | Admitting: *Deleted

## 2010-12-07 MED ORDER — LEVOTHYROXINE SODIUM 88 MCG PO TABS: 88.0000 ug | ORAL_TABLET | Freq: Every day | ORAL | Status: DC

## 2010-12-07 NOTE — Telephone Encounter (Signed)
Pt is in [place] and needs  generic Synthroid called to [name]  [phone]

## 2010-12-18 NOTE — Assessment & Plan Note (Signed)
[facility]                                 ON-CALL NOTE   NAME:[name], Ryan. [name]                   MRN:          [id number]  DATE:01/04/2008                            DOB:          [birth year]    Phone# [phone]   HISTORY OF PRESENT ILLNESS:  The patient was seen by Dr. Tawanna Cooler today and  was placed on Keflex.  Pharmacy flat says to caution or let doctor know  if you are allergic to penicillin.  He is allergic to penicillin, but  today he did not discuss with Dr. Tawanna Cooler today.  Wonders if it is okay to  take as his past reaction to penicillin was in the remote past which  sounds like rash and hives but no respiratory difficulty or anaphylaxis.  Told him the risk of Keflex would be very low with cross reaction, but  be cautious and call back if he has any.     Neta Mends. Panosh, MD  Electronically Signed    WKP/MedQ  DD: 01/04/2008  DT: 01/05/2008  Job #: [id number]

## 2010-12-21 NOTE — Assessment & Plan Note (Signed)
[facility]                           GASTROENTEROLOGY OFFICE NOTE   JACQUES, FIFE                    MRN:          [id number]  DATE:04/23/2006                            DOB:          Jun 15, 1946    PROBLEM:  Abdominal pain.   HISTORY OF PRESENT ILLNESS:  Mr. Schuenemann has returned for scheduled GI  follow up. He continues to have very intermittent mild discomfort in his  right abdomen. It seems to be worsened in certain positions including  crossing his legs or bending over. It is unrelated to eating or bowel  movements. Clinoril and hyoscyamine were unsuccessful in relieving the pain.  He denies nausea, change in bowel habits, fever, melena or hematochezia. He  denies dysuria or urinary frequency.   PHYSICAL EXAMINATION:  VITAL SIGNS:  Pulse 64, blood pressure 122/80, weight  is 220.  There is no CVA tenderness.  ABDOMINAL EXAM:  He has point tenderness in the  right periumbilical area approximately 3 cm to the right of the umbilicus.  Tenderness increases with abdominal wall flexion. There are no abdominal  masses or organomegaly.   IMPRESSION:  Musculoskeletal pain.   RECOMMENDATIONS:  Local heat and NSAIDs. No further GI work up.                                   Barbette Hair. Arlyce Dice, MD,FACG   RDK/MedQ  DD:  04/23/2006  DT:  04/24/2006  Job #:  [id number]   cc:   Tinnie Gens A. Tawanna Cooler, MD

## 2011-02-01 ENCOUNTER — Other Ambulatory Visit: Payer: Self-pay | Admitting: Family Medicine

## 2011-02-14 ENCOUNTER — Encounter: Payer: Self-pay | Admitting: Family Medicine

## 2011-02-19 ENCOUNTER — Encounter: Payer: Self-pay | Admitting: Family Medicine

## 2011-02-19 ENCOUNTER — Ambulatory Visit (INDEPENDENT_AMBULATORY_CARE_PROVIDER_SITE_OTHER): Payer: Medicare Other | Admitting: Family Medicine

## 2011-02-19 VITALS — BP 110/80 | Temp 98.2°F | Ht 72.25 in | Wt 220.0 lb

## 2011-02-19 DIAGNOSIS — J309 Allergic rhinitis, unspecified: Secondary | ICD-10-CM

## 2011-02-19 DIAGNOSIS — Z125 Encounter for screening for malignant neoplasm of prostate: Secondary | ICD-10-CM

## 2011-02-19 DIAGNOSIS — J45909 Unspecified asthma, uncomplicated: Secondary | ICD-10-CM

## 2011-02-19 DIAGNOSIS — Z0000 Encounter for general adult medical examination without abnormal findings: Secondary | ICD-10-CM

## 2011-02-19 DIAGNOSIS — E039 Hypothyroidism, unspecified: Secondary | ICD-10-CM

## 2011-02-19 DIAGNOSIS — Z13220 Encounter for screening for lipoid disorders: Secondary | ICD-10-CM

## 2011-02-19 DIAGNOSIS — Z136 Encounter for screening for cardiovascular disorders: Secondary | ICD-10-CM

## 2011-02-19 DIAGNOSIS — M25519 Pain in unspecified shoulder: Secondary | ICD-10-CM

## 2011-02-19 LAB — CBC WITH DIFFERENTIAL/PLATELET
Basophils Relative: 0.7 % (ref 0.0–3.0)
HCT: 43.8 % (ref 39.0–52.0)
Lymphs Abs: 1.1 10*3/uL (ref 0.7–4.0)
MCV: 95.5 fl (ref 78.0–100.0)
Neutrophils Relative %: 72 % (ref 43.0–77.0)
Platelets: 250 10*3/uL (ref 150.0–400.0)
RBC: 4.58 Mil/uL (ref 4.22–5.81)
RDW: 12.8 % (ref 11.5–14.6)

## 2011-02-19 LAB — BASIC METABOLIC PANEL
CO2: 30 mEq/L (ref 19–32)
Calcium: 9 mg/dL (ref 8.4–10.5)
Chloride: 106 mEq/L (ref 96–112)
Creatinine, Ser: 0.9 mg/dL (ref 0.4–1.5)
GFR: 87.39 mL/min (ref >60.00)
Glucose, Bld: 98 mg/dL (ref 70–99)
Sodium: 139 mEq/L (ref 135–145)

## 2011-02-19 LAB — TSH: TSH: 2.46 u[IU]/mL (ref 0.35–5.50)

## 2011-02-19 LAB — LIPID PANEL
HDL: 40.4 mg/dL (ref >39.00)
Total CHOL/HDL Ratio: 4
Triglycerides: 64 mg/dL (ref 0.0–149.0)
VLDL: 12.8 mg/dL (ref 0.0–40.0)

## 2011-02-19 LAB — HEPATIC FUNCTION PANEL
ALT: 24 U/L (ref 0–53)
Albumin: 4.1 g/dL (ref 3.5–5.2)
Alkaline Phosphatase: 69 U/L (ref 39–117)
Bilirubin, Direct: 0.1 mg/dL (ref 0.0–0.3)
Total Protein: 6.4 g/dL (ref 6.0–8.3)

## 2011-02-19 LAB — POCT URINALYSIS DIPSTICK
Nitrite, UA: NEGATIVE
Spec Grav, UA: 1.02
Urobilinogen, UA: 1

## 2011-02-19 LAB — PSA: PSA: 1.56 ng/mL (ref 0.10–4.00)

## 2011-02-19 MED ORDER — BECLOMETHASONE DIPROPIONATE 40 MCG/ACT IN AERS: 2.0000 | INHALATION_SPRAY | Freq: Two times a day (BID) | RESPIRATORY_TRACT | Status: DC

## 2011-02-19 MED ORDER — LEVOTHYROXINE SODIUM 88 MCG PO TABS: 88.0000 ug | ORAL_TABLET | Freq: Every day | ORAL | Status: DC

## 2011-02-19 NOTE — Progress Notes (Signed)
  Subjective:    Patient ID: Ryan [name], male    DOB: 1946/04/22, 65 y.o.   MRN: [id number]  HPI Ryan [name] is a 64 year old, married male, nonsmoker, who comes in today for Medicare wellness examination because of a history of underlying allergic rhinitis, asthma, hypothyroidism, and a new problem of bilateral shoulder pain.  He takes an inhaled steroid, two puffs b.i.d. To prevent asthma.  He takes Synthroid 80 mcg daily check TSH level today.  He got bilateral shoulder pain.  His been seen in the past, but or so and have him injected to recommend he go back and see him again.  He gets routine eye care, dental care, hearing normal, colonoscopy, normal, activities of daily living.  Normal is not walking every day.  Encouraged to walk.  Home health safety reviewed.  No issues identified.  No guns in the house.  He does have a healthcare power of attorney and living well.  Cognitive function, normal.  Shingles 2011, tetanus, 2007, Pneumovax 2011.   Review of Systems  Constitutional: Negative.   HENT: Negative.   Eyes: Negative.   Respiratory: Negative.   Cardiovascular: Negative.   Gastrointestinal: Negative.   Genitourinary: Negative.   Musculoskeletal: Negative.   Skin: Negative.   Neurological: Negative.   Hematological: Negative.   Psychiatric/Behavioral: Negative.        Objective:   Physical Exam  Constitutional: He is oriented to person, place, and time. He appears well-developed and well-nourished.  HENT:  Head: Normocephalic and atraumatic.  Right Ear: External ear normal.  Left Ear: External ear normal.  Nose: Nose normal.  Mouth/Throat: Oropharynx is clear and moist.  Eyes: Conjunctivae and EOM are normal. Pupils are equal, round, and reactive to light.  Neck: Normal range of motion. Neck supple. No JVD present. No tracheal deviation present. No thyromegaly present.  Cardiovascular: Normal rate, regular rhythm, normal heart sounds and intact distal pulses.  Exam  reveals no gallop and no friction rub.   No murmur heard. Pulmonary/Chest: Effort normal and breath sounds normal. No stridor. No respiratory distress. He has no wheezes. He has no rales. He exhibits no tenderness.  Abdominal: Soft. Bowel sounds are normal. He exhibits no distension and no mass. There is no tenderness. There is no rebound and no guarding.  Genitourinary: Rectum normal, prostate normal and penis normal. Guaiac negative stool. No penile tenderness.  Musculoskeletal: Normal range of motion. He exhibits no edema and no tenderness.  Lymphadenopathy:    He has no cervical adenopathy.  Neurological: He is alert and oriented to person, place, and time. He has normal reflexes. No cranial nerve deficit. He exhibits normal muscle tone.  Skin: Skin is warm and dry. No rash noted. No erythema. No pallor.  Psychiatric: He has a normal mood and affect. His behavior is normal. Judgment and thought content normal.          Assessment & Plan:  Healthy male.  Bilateral shoulder pain referred to Dr. Cleophas Dunker.  Hypothyroidism.  Continue Synthroid.  Asthma.  Continue Qvar two puffs b.i.d., and a baby aspirin daily.  Return in one year, sooner for any problems

## 2011-02-19 NOTE — Patient Instructions (Signed)
Continue your current medications.  Follow-up one year, sooner if any problems.  I would recommend Dr. Norlene Campbell, orthopedist, for your shoulders

## 2011-06-04 ENCOUNTER — Encounter: Payer: Self-pay | Admitting: Gastroenterology

## 2011-08-07 ENCOUNTER — Other Ambulatory Visit: Payer: Self-pay | Admitting: *Deleted

## 2011-08-07 DIAGNOSIS — M25519 Pain in unspecified shoulder: Secondary | ICD-10-CM | POA: Diagnosis not present

## 2011-08-07 DIAGNOSIS — M67919 Unspecified disorder of synovium and tendon, unspecified shoulder: Secondary | ICD-10-CM | POA: Diagnosis not present

## 2011-08-07 DIAGNOSIS — M25619 Stiffness of unspecified shoulder, not elsewhere classified: Secondary | ICD-10-CM | POA: Diagnosis not present

## 2011-08-07 DIAGNOSIS — M75110 Incomplete rotator cuff tear or rupture of unspecified shoulder, not specified as traumatic: Secondary | ICD-10-CM | POA: Diagnosis not present

## 2011-08-07 DIAGNOSIS — M719 Bursopathy, unspecified: Secondary | ICD-10-CM | POA: Diagnosis not present

## 2011-08-07 DIAGNOSIS — E039 Hypothyroidism, unspecified: Secondary | ICD-10-CM

## 2011-08-07 MED ORDER — LEVOTHYROXINE SODIUM 88 MCG PO TABS: 88.0000 ug | ORAL_TABLET | Freq: Every day | ORAL | Status: DC

## 2011-08-09 DIAGNOSIS — M67919 Unspecified disorder of synovium and tendon, unspecified shoulder: Secondary | ICD-10-CM | POA: Diagnosis not present

## 2011-08-09 DIAGNOSIS — M25619 Stiffness of unspecified shoulder, not elsewhere classified: Secondary | ICD-10-CM | POA: Diagnosis not present

## 2011-08-09 DIAGNOSIS — M75110 Incomplete rotator cuff tear or rupture of unspecified shoulder, not specified as traumatic: Secondary | ICD-10-CM | POA: Diagnosis not present

## 2011-08-09 DIAGNOSIS — M25519 Pain in unspecified shoulder: Secondary | ICD-10-CM | POA: Diagnosis not present

## 2011-08-12 DIAGNOSIS — M75110 Incomplete rotator cuff tear or rupture of unspecified shoulder, not specified as traumatic: Secondary | ICD-10-CM | POA: Diagnosis not present

## 2011-08-12 DIAGNOSIS — M25519 Pain in unspecified shoulder: Secondary | ICD-10-CM | POA: Diagnosis not present

## 2011-08-12 DIAGNOSIS — M719 Bursopathy, unspecified: Secondary | ICD-10-CM | POA: Diagnosis not present

## 2011-08-12 DIAGNOSIS — M67919 Unspecified disorder of synovium and tendon, unspecified shoulder: Secondary | ICD-10-CM | POA: Diagnosis not present

## 2011-08-12 DIAGNOSIS — M25619 Stiffness of unspecified shoulder, not elsewhere classified: Secondary | ICD-10-CM | POA: Diagnosis not present

## 2011-08-14 DIAGNOSIS — M25619 Stiffness of unspecified shoulder, not elsewhere classified: Secondary | ICD-10-CM | POA: Diagnosis not present

## 2011-08-14 DIAGNOSIS — M25519 Pain in unspecified shoulder: Secondary | ICD-10-CM | POA: Diagnosis not present

## 2011-08-14 DIAGNOSIS — M67919 Unspecified disorder of synovium and tendon, unspecified shoulder: Secondary | ICD-10-CM | POA: Diagnosis not present

## 2011-08-14 DIAGNOSIS — M75110 Incomplete rotator cuff tear or rupture of unspecified shoulder, not specified as traumatic: Secondary | ICD-10-CM | POA: Diagnosis not present

## 2011-08-16 DIAGNOSIS — M75110 Incomplete rotator cuff tear or rupture of unspecified shoulder, not specified as traumatic: Secondary | ICD-10-CM | POA: Diagnosis not present

## 2011-08-16 DIAGNOSIS — M25519 Pain in unspecified shoulder: Secondary | ICD-10-CM | POA: Diagnosis not present

## 2011-08-16 DIAGNOSIS — M67919 Unspecified disorder of synovium and tendon, unspecified shoulder: Secondary | ICD-10-CM | POA: Diagnosis not present

## 2011-08-16 DIAGNOSIS — M25619 Stiffness of unspecified shoulder, not elsewhere classified: Secondary | ICD-10-CM | POA: Diagnosis not present

## 2011-09-12 ENCOUNTER — Ambulatory Visit (INDEPENDENT_AMBULATORY_CARE_PROVIDER_SITE_OTHER): Payer: Medicare Other | Admitting: Family Medicine

## 2011-09-12 ENCOUNTER — Encounter: Payer: Self-pay | Admitting: Family Medicine

## 2011-09-12 VITALS — BP 140/80 | Temp 98.7°F | Wt 220.0 lb

## 2011-09-12 DIAGNOSIS — E039 Hypothyroidism, unspecified: Secondary | ICD-10-CM

## 2011-09-12 DIAGNOSIS — J45909 Unspecified asthma, uncomplicated: Secondary | ICD-10-CM | POA: Diagnosis not present

## 2011-09-12 MED ORDER — HYDROCODONE-HOMATROPINE 5-1.5 MG/5ML PO SYRP: ORAL_SOLUTION | ORAL | Status: DC

## 2011-09-12 MED ORDER — PREDNISONE 20 MG PO TABS: ORAL_TABLET | ORAL | Status: DC

## 2011-09-12 MED ORDER — SYNTHROID 88 MCG PO TABS: 88.0000 ug | ORAL_TABLET | Freq: Every day | ORAL | Status: DC

## 2011-09-12 NOTE — Patient Instructions (Signed)
Drink lots of water  Run  vaporizer in y  bedroom at night  Prednisone as directed  Hydromet as directed  Return when necessary

## 2011-09-12 NOTE — Progress Notes (Signed)
  Subjective:    Patient ID: Ryan [name], male    DOB: 04-12-1946, 66 y.o.   MRN: [id number]  HPI Ryan [name] is a 66 year old married male who comes in today accompanied by his wife/????????? for evaluation of a cold  He's had a cold for about a week to 10 days. He feels now tightness in the chest. He's had a history of asthma in the past when he gets a bad cold. No fever no sputum production    Review of Systems    general and pulmonary review of systems otherwise negative Objective:   Physical Exam Well-developed well-nourished male in no acute distress HEENT negative neck was supple no adenopathy lungs are clear except for symmetrical leg mild expiratory wheezing on forced expiration       Assessment & Plan:  Viral syndrome with secondary asthma plan prednisone burst and taper return when necessary

## 2011-10-02 DIAGNOSIS — M542 Cervicalgia: Secondary | ICD-10-CM | POA: Diagnosis not present

## 2011-10-03 DIAGNOSIS — M542 Cervicalgia: Secondary | ICD-10-CM | POA: Diagnosis not present

## 2011-10-08 DIAGNOSIS — M542 Cervicalgia: Secondary | ICD-10-CM | POA: Diagnosis not present

## 2011-10-17 DIAGNOSIS — M542 Cervicalgia: Secondary | ICD-10-CM | POA: Diagnosis not present

## 2011-10-23 DIAGNOSIS — M542 Cervicalgia: Secondary | ICD-10-CM | POA: Diagnosis not present

## 2011-10-25 DIAGNOSIS — M542 Cervicalgia: Secondary | ICD-10-CM | POA: Diagnosis not present

## 2011-10-29 DIAGNOSIS — M542 Cervicalgia: Secondary | ICD-10-CM | POA: Diagnosis not present

## 2012-02-03 ENCOUNTER — Other Ambulatory Visit: Payer: Self-pay | Admitting: Dermatology

## 2012-02-03 DIAGNOSIS — L821 Other seborrheic keratosis: Secondary | ICD-10-CM | POA: Diagnosis not present

## 2012-02-03 DIAGNOSIS — L820 Inflamed seborrheic keratosis: Secondary | ICD-10-CM | POA: Diagnosis not present

## 2012-02-03 DIAGNOSIS — D1801 Hemangioma of skin and subcutaneous tissue: Secondary | ICD-10-CM | POA: Diagnosis not present

## 2012-02-03 DIAGNOSIS — D239 Other benign neoplasm of skin, unspecified: Secondary | ICD-10-CM | POA: Diagnosis not present

## 2012-04-07 ENCOUNTER — Telehealth: Payer: Self-pay | Admitting: Family Medicine

## 2012-04-07 DIAGNOSIS — J45909 Unspecified asthma, uncomplicated: Secondary | ICD-10-CM

## 2012-04-07 MED ORDER — HYDROCODONE-HOMATROPINE 5-1.5 MG/5ML PO SYRP: ORAL_SOLUTION | ORAL | Status: DC

## 2012-04-07 NOTE — Telephone Encounter (Signed)
ok 

## 2012-04-07 NOTE — Telephone Encounter (Signed)
Rx called in.  Wife is aware

## 2012-04-07 NOTE — Telephone Encounter (Signed)
Patient spouse called stating [place] and her husband is having respiratory issues and would like a refill of his HYCODAN) 5-1.5 MG/5ML syrup [[id number]]  Called into [facility] on [address], [place] ph [phone]. Please advise/assist.

## 2012-04-29 ENCOUNTER — Ambulatory Visit (INDEPENDENT_AMBULATORY_CARE_PROVIDER_SITE_OTHER): Payer: Medicare Other | Admitting: Family Medicine

## 2012-04-29 ENCOUNTER — Encounter: Payer: Self-pay | Admitting: Family Medicine

## 2012-04-29 VITALS — BP 120/80 | Temp 98.3°F | Ht 72.5 in | Wt 216.0 lb

## 2012-04-29 DIAGNOSIS — N401 Enlarged prostate with lower urinary tract symptoms: Secondary | ICD-10-CM

## 2012-04-29 DIAGNOSIS — Z23 Encounter for immunization: Secondary | ICD-10-CM | POA: Diagnosis not present

## 2012-04-29 DIAGNOSIS — E039 Hypothyroidism, unspecified: Secondary | ICD-10-CM | POA: Diagnosis not present

## 2012-04-29 DIAGNOSIS — E785 Hyperlipidemia, unspecified: Secondary | ICD-10-CM | POA: Diagnosis not present

## 2012-04-29 DIAGNOSIS — Z0000 Encounter for general adult medical examination without abnormal findings: Secondary | ICD-10-CM | POA: Diagnosis not present

## 2012-04-29 DIAGNOSIS — J309 Allergic rhinitis, unspecified: Secondary | ICD-10-CM | POA: Diagnosis not present

## 2012-04-29 DIAGNOSIS — J45909 Unspecified asthma, uncomplicated: Secondary | ICD-10-CM | POA: Diagnosis not present

## 2012-04-29 DIAGNOSIS — R351 Nocturia: Secondary | ICD-10-CM

## 2012-04-29 LAB — CBC WITH DIFFERENTIAL/PLATELET
Basophils Absolute: 0.1 10*3/uL (ref 0.0–0.1)
Eosinophils Absolute: 0.1 10*3/uL (ref 0.0–0.7)
MCHC: 33 g/dL (ref 30.0–36.0)
MCV: 94.8 fl (ref 78.0–100.0)
Monocytes Absolute: 0.5 10*3/uL (ref 0.1–1.0)
Neutrophils Relative %: 70.2 % (ref 43.0–77.0)
Platelets: 259 10*3/uL (ref 150.0–400.0)
RDW: 13.1 % (ref 11.5–14.6)

## 2012-04-29 LAB — HEPATIC FUNCTION PANEL
AST: 20 U/L (ref 0–37)
Alkaline Phosphatase: 79 U/L (ref 39–117)
Bilirubin, Direct: 0.1 mg/dL (ref 0.0–0.3)
Total Bilirubin: 0.9 mg/dL (ref 0.3–1.2)

## 2012-04-29 LAB — POCT URINALYSIS DIPSTICK
Leukocytes, UA: NEGATIVE
Protein, UA: NEGATIVE
Spec Grav, UA: 1.02
Urobilinogen, UA: 0.2

## 2012-04-29 LAB — LIPID PANEL
Total CHOL/HDL Ratio: 5
Triglycerides: 93 mg/dL (ref 0.0–149.0)

## 2012-04-29 LAB — BASIC METABOLIC PANEL
CO2: 29 mEq/L (ref 19–32)
Calcium: 9.5 mg/dL (ref 8.4–10.5)
Creatinine, Ser: 1 mg/dL (ref 0.4–1.5)

## 2012-04-29 LAB — TSH: TSH: 3.43 u[IU]/mL (ref 0.35–5.50)

## 2012-04-29 MED ORDER — PREDNISONE 20 MG PO TABS: ORAL_TABLET | ORAL | Status: DC

## 2012-04-29 MED ORDER — SYNTHROID 88 MCG PO TABS: 88.0000 ug | ORAL_TABLET | Freq: Every day | ORAL | Status: DC

## 2012-04-29 MED ORDER — HYDROCODONE-HOMATROPINE 5-1.5 MG/5ML PO SYRP: ORAL_SOLUTION | ORAL | Status: DC

## 2012-04-29 NOTE — Patient Instructions (Addendum)
Continue your thyroid medication daily  Zyrtec 10 mg plain at bedtime for allergic rhinitis  Short course of prednisone and cough syrup when necessary for wheezing  If however your symptoms get worse I would recommend a consult with Dr. Arlyss Gandy immunologist.  OTC saw palmetto one daily for BPH symptoms. It also helps to stay off of caffeine  Return one year sooner if any problems

## 2012-04-29 NOTE — Progress Notes (Signed)
  Subjective:    Patient ID: Ryan [name], male    DOB: 1945/09/30, 66 y.o.   MRN: [id number]  HPI Ryan [name] is a 66 year old married male nonsmoker who comes in today for a Medicare wellness examination because of a history of allergic rhinitis, asthma, hypothyroidism,  In the last year he's had 4 episodes worries had to take prednisone because of wheezing. Has a history of allergic rhinitis and asthma and was on allergy shots years ago. He also has a number of food allergies.  He takes Synthroid 88 mcg daily for hypothyroidism  He gets routine eye care, dental care, colonoscopy and GI, tetanus 2007, Pneumovax 2011, seasonal flu shot today, information given on shingles  He had surgery on his shoulder in November did well no complications. He does have some BPH with nocturia.   Review of Systems  Constitutional: Negative.   HENT: Negative.   Eyes: Negative.   Respiratory: Negative.   Cardiovascular: Negative.   Gastrointestinal: Negative.   Genitourinary: Negative.   Musculoskeletal: Negative.   Skin: Negative.   Neurological: Negative.   Hematological: Negative.   Psychiatric/Behavioral: Negative.        Objective:   Physical Exam  Constitutional: He is oriented to person, place, and time. He appears well-developed and well-nourished.  HENT:  Head: Normocephalic and atraumatic.  Right Ear: External ear normal.  Left Ear: External ear normal.  Nose: Nose normal.  Mouth/Throat: Oropharynx is clear and moist.  Eyes: Conjunctivae normal and EOM are normal. Pupils are equal, round, and reactive to light.  Neck: Normal range of motion. Neck supple. No JVD present. No tracheal deviation present. No thyromegaly present.  Cardiovascular: Normal rate, regular rhythm, normal heart sounds and intact distal pulses.  Exam reveals no gallop and no friction rub.   No murmur heard. Pulmonary/Chest: Effort normal and breath sounds normal. No stridor. No respiratory distress. He has no  wheezes. He has no rales. He exhibits no tenderness.  Abdominal: Soft. Bowel sounds are normal. He exhibits no distension and no mass. There is no tenderness. There is no rebound and no guarding.  Genitourinary: Rectum normal and penis normal. Guaiac negative stool. No penile tenderness.       Symmetrical 2+ BPH  Musculoskeletal: Normal range of motion. He exhibits no edema and no tenderness.  Lymphadenopathy:    He has no cervical adenopathy.  Neurological: He is alert and oriented to person, place, and time. He has normal reflexes. No cranial nerve deficit. He exhibits normal muscle tone.  Skin: Skin is warm and dry. No rash noted. No erythema. No pallor.  Psychiatric: He has a normal mood and affect. His behavior is normal. Judgment and thought content normal.          Assessment & Plan:  Healthy male  Hypothyroidism continue Synthroid check labs  Allergic rhinitis and asthma recurrent plan Zyrtec each bedtime prednisone once or twice yearly when necessary  BPH with outlet instruction check PSA reassured  Status post surgery on his shoulder.  Chronic sun damage followed by dermatology recent basal cell removed from his leg

## 2012-06-17 DIAGNOSIS — H2510 Age-related nuclear cataract, unspecified eye: Secondary | ICD-10-CM | POA: Diagnosis not present

## 2012-06-17 DIAGNOSIS — H43819 Vitreous degeneration, unspecified eye: Secondary | ICD-10-CM | POA: Diagnosis not present

## 2012-06-17 DIAGNOSIS — H02839 Dermatochalasis of unspecified eye, unspecified eyelid: Secondary | ICD-10-CM | POA: Diagnosis not present

## 2012-08-11 ENCOUNTER — Ambulatory Visit: Payer: No Typology Code available for payment source | Admitting: Family Medicine

## 2012-08-14 ENCOUNTER — Other Ambulatory Visit: Payer: Self-pay | Admitting: Dermatology

## 2012-08-14 DIAGNOSIS — D485 Neoplasm of uncertain behavior of skin: Secondary | ICD-10-CM | POA: Diagnosis not present

## 2012-08-14 DIAGNOSIS — Z85828 Personal history of other malignant neoplasm of skin: Secondary | ICD-10-CM | POA: Diagnosis not present

## 2012-08-14 DIAGNOSIS — L819 Disorder of pigmentation, unspecified: Secondary | ICD-10-CM | POA: Diagnosis not present

## 2012-08-14 DIAGNOSIS — L570 Actinic keratosis: Secondary | ICD-10-CM | POA: Diagnosis not present

## 2012-08-14 DIAGNOSIS — D1801 Hemangioma of skin and subcutaneous tissue: Secondary | ICD-10-CM | POA: Diagnosis not present

## 2012-08-14 DIAGNOSIS — L821 Other seborrheic keratosis: Secondary | ICD-10-CM | POA: Diagnosis not present

## 2012-08-28 ENCOUNTER — Telehealth: Payer: Self-pay | Admitting: Gastroenterology

## 2012-08-28 NOTE — Telephone Encounter (Signed)
Left message for pt to call back.  Discussed with pt that he is not due until 2015 and he is not overdue for his colon. Pt states that mychart stated he was overdue and he just wanted to make sure.

## 2013-03-09 DIAGNOSIS — L819 Disorder of pigmentation, unspecified: Secondary | ICD-10-CM | POA: Diagnosis not present

## 2013-03-09 DIAGNOSIS — Z85828 Personal history of other malignant neoplasm of skin: Secondary | ICD-10-CM | POA: Diagnosis not present

## 2013-03-09 DIAGNOSIS — L570 Actinic keratosis: Secondary | ICD-10-CM | POA: Diagnosis not present

## 2013-03-09 DIAGNOSIS — L821 Other seborrheic keratosis: Secondary | ICD-10-CM | POA: Diagnosis not present

## 2013-04-13 ENCOUNTER — Encounter: Payer: Self-pay | Admitting: Gastroenterology

## 2013-05-13 ENCOUNTER — Ambulatory Visit (INDEPENDENT_AMBULATORY_CARE_PROVIDER_SITE_OTHER): Payer: Medicare Other | Admitting: Family Medicine

## 2013-05-13 ENCOUNTER — Encounter: Payer: Self-pay | Admitting: Family Medicine

## 2013-05-13 VITALS — BP 110/80 | HR 64 | Temp 98.5°F | Ht 72.25 in | Wt 220.0 lb

## 2013-05-13 DIAGNOSIS — L820 Inflamed seborrheic keratosis: Secondary | ICD-10-CM | POA: Diagnosis not present

## 2013-05-13 DIAGNOSIS — C4490 Unspecified malignant neoplasm of skin, unspecified: Secondary | ICD-10-CM | POA: Diagnosis not present

## 2013-05-13 DIAGNOSIS — Z23 Encounter for immunization: Secondary | ICD-10-CM

## 2013-05-13 DIAGNOSIS — E039 Hypothyroidism, unspecified: Secondary | ICD-10-CM

## 2013-05-13 DIAGNOSIS — J309 Allergic rhinitis, unspecified: Secondary | ICD-10-CM

## 2013-05-13 DIAGNOSIS — Z125 Encounter for screening for malignant neoplasm of prostate: Secondary | ICD-10-CM

## 2013-05-13 DIAGNOSIS — Z0000 Encounter for general adult medical examination without abnormal findings: Secondary | ICD-10-CM

## 2013-05-13 DIAGNOSIS — J45909 Unspecified asthma, uncomplicated: Secondary | ICD-10-CM

## 2013-05-13 LAB — LIPID PANEL
HDL: 48 mg/dL (ref >39.00)
Total CHOL/HDL Ratio: 4
Triglycerides: 82 mg/dL (ref 0.0–149.0)
VLDL: 16.4 mg/dL (ref 0.0–40.0)

## 2013-05-13 LAB — POCT URINALYSIS DIPSTICK
Bilirubin, UA: NEGATIVE
Blood, UA: NEGATIVE
Ketones, UA: NEGATIVE
Leukocytes, UA: NEGATIVE
Nitrite, UA: NEGATIVE
Protein, UA: NEGATIVE
Urobilinogen, UA: 0.2
pH, UA: 7

## 2013-05-13 LAB — HEPATIC FUNCTION PANEL
ALT: 26 U/L (ref 0–53)
AST: 19 U/L (ref 0–37)
Alkaline Phosphatase: 68 U/L (ref 39–117)
Bilirubin, Direct: 0.1 mg/dL (ref 0.0–0.3)
Total Bilirubin: 0.7 mg/dL (ref 0.3–1.2)
Total Protein: 6.7 g/dL (ref 6.0–8.3)

## 2013-05-13 LAB — BASIC METABOLIC PANEL
BUN: 19 mg/dL (ref 6–23)
CO2: 28 mEq/L (ref 19–32)
Chloride: 102 mEq/L (ref 96–112)
Creatinine, Ser: 0.9 mg/dL (ref 0.4–1.5)
Glucose, Bld: 96 mg/dL (ref 70–99)
Potassium: 5 mEq/L (ref 3.5–5.1)

## 2013-05-13 LAB — CBC WITH DIFFERENTIAL/PLATELET
Basophils Absolute: 0 10*3/uL (ref 0.0–0.1)
Eosinophils Absolute: 0.1 10*3/uL (ref 0.0–0.7)
Eosinophils Relative: 1.2 % (ref 0.0–5.0)
HCT: 44.2 % (ref 39.0–52.0)
Hemoglobin: 14.9 g/dL (ref 13.0–17.0)
Lymphs Abs: 1.3 10*3/uL (ref 0.7–4.0)
MCHC: 33.6 g/dL (ref 30.0–36.0)
MCV: 92.7 fl (ref 78.0–100.0)
Monocytes Absolute: 0.5 10*3/uL (ref 0.1–1.0)
Monocytes Relative: 7.4 % (ref 3.0–12.0)
Neutro Abs: 4.9 10*3/uL (ref 1.4–7.7)
Neutrophils Relative %: 72.1 % (ref 43.0–77.0)
Platelets: 291 10*3/uL (ref 150.0–400.0)
RDW: 13.1 % (ref 11.5–14.6)
WBC: 6.7 10*3/uL (ref 4.5–10.5)

## 2013-05-13 MED ORDER — SYNTHROID 88 MCG PO TABS: 88.0000 ug | ORAL_TABLET | Freq: Every day | ORAL | Status: DC

## 2013-05-13 NOTE — Progress Notes (Signed)
Subjective:    Patient ID: Ryan [name], male    DOB: 1946/07/31, 67 y.o.   MRN: [id number]  HPI Mr. Nied is a 67 year old married male nonsmoker who comes in today for a Medicare wellness examination  6 he takes Synthroid 88 mcg daily because of a history of hypothyroidism  He takes one aspirin tablet daily  He is albuterol when necessary he has mild intermittent asthma and your albuterol when necessary works well. He uses no more than one canister and 12 month period time 6.  Cognitive function normal he walks on a regular basis home health safety reviewed no issues identified, no guns in the house, he does have a health care power of attorney and living well  He does have light skin and light eyes she's had skin cancer. He sees his dermatologist Dr. Yetta Barre about every 6 months.  He gets routine eye care, dental care, colonoscopy and GI, vaccinations up-to-date for seasonal flu shot given today. Information given on shingles  He does have to with his bowels. He only has about once a week. This is a long-standing problem and says is normal for him over he's been having episodes of feeling bloated recently he's had a recent colonoscopy which is normal. His appointment to see Dr. Oscar La on the 13th of this month  My recommendation would be take some type about program that begins having a bowel movement at least once daily we'll defer to Dr. Oscar La   Review of Systems  Constitutional: Negative.   HENT: Negative.   Eyes: Negative.   Respiratory: Negative.   Cardiovascular: Negative.   Gastrointestinal: Negative.   Genitourinary: Negative.   Musculoskeletal: Negative.   Skin: Negative.   Neurological: Negative.   Psychiatric/Behavioral: Negative.        Objective:   Physical Exam  Constitutional: He is oriented to person, place, and time. He appears well-developed and well-nourished.  HENT:  Head: Normocephalic and atraumatic.  Right Ear: External ear normal.  Left  Ear: External ear normal.  Nose: Nose normal.  Mouth/Throat: Oropharynx is clear and moist.  Eyes: Conjunctivae and EOM are normal. Pupils are equal, round, and reactive to light.  Neck: Normal range of motion. Neck supple. No JVD present. No tracheal deviation present. No thyromegaly present.  Cardiovascular: Normal rate, regular rhythm, normal heart sounds and intact distal pulses.  Exam reveals no gallop and no friction rub.   No murmur heard. No carotid aorta bruits peripheral pulses 2+ and symmetrical  Pulmonary/Chest: Effort normal and breath sounds normal. No stridor. No respiratory distress. He has no wheezes. He has no rales. He exhibits no tenderness.  Abdominal: Soft. Bowel sounds are normal. He exhibits no distension and no mass. There is no tenderness. There is no rebound and no guarding.  Genitourinary: Rectum normal, prostate normal and penis normal. Guaiac negative stool. No penile tenderness.  Musculoskeletal: Normal range of motion. He exhibits no edema and no tenderness.  Lymphadenopathy:    He has no cervical adenopathy.  Neurological: He is alert and oriented to person, place, and time. He has normal reflexes. No cranial nerve deficit. He exhibits normal muscle tone.  Skin: Skin is warm and dry. No rash noted. No erythema. No pallor.  Psychiatric: He has a normal mood and affect. His behavior is normal. Judgment and thought content normal.          Assessment & Plan:  Healthy male  Hypothyroidism check labs  History of light skin and light eyes and  skin cancer meticulous sun prevention followup by dermatology  Constipation consult with Dr. Oscar La I would recommend he start a bowel program so that he has a bowel movement at least once daily. Would defer to Dr. Oscar La  Occasional intermittent mild asthma albuterol when necessary

## 2013-05-13 NOTE — Patient Instructions (Signed)
Continue current medications. Consult with Dr. Arlyce Dice about about program severe having a bowel movement at least once daily  Return in one year sooner if any problems  Consult with your insurance company to find out where you get your cheapest shingles vaccine

## 2013-05-17 ENCOUNTER — Ambulatory Visit (INDEPENDENT_AMBULATORY_CARE_PROVIDER_SITE_OTHER): Payer: Medicare Other | Admitting: Gastroenterology

## 2013-05-17 ENCOUNTER — Encounter: Payer: Self-pay | Admitting: Gastroenterology

## 2013-05-17 VITALS — BP 120/78 | HR 60 | Ht 71.26 in | Wt 220.0 lb

## 2013-05-17 DIAGNOSIS — K5900 Constipation, unspecified: Secondary | ICD-10-CM | POA: Insufficient documentation

## 2013-05-17 NOTE — Patient Instructions (Signed)

## 2013-05-17 NOTE — Progress Notes (Signed)
History of Present Illness: This 67 year old white male referred for evaluation of constipation.  Over the past 3-4 years she developed constipation characterized by a bowel movement every 5-7 days.  When he has a bowel movement it initially is hard following several soft stools.  He has predictable abdominal discomfort following this.  There is no history of rectal bleeding.  He occasionally has rectal discomfort.  Last colonoscopy 2005 was normal.    Past Medical History  Diagnosis Date  . Allergy   . Anxiety   . Hypothyroidism   . Arthritis   . Asthma    Past Surgical History  Procedure Laterality Date  . Electrocardiogram    . Colonoscopy  10.27.2005  . Shoulder surgery Right   . Tonsillectomy     family history includes Diabetes in his mother; Diverticulosis in his mother and sister; Heart disease in his sister; Heart failure in his mother; Irritable bowel syndrome in his mother and sister; Lung cancer in his father. Current Outpatient Prescriptions  Medication Sig Dispense Refill  . aspirin 325 MG tablet Take 325 mg by mouth daily.        Marland Kitchen FISH OIL-KRILL OIL PO Take 1 tablet by mouth daily.      . Multiple Vitamin (MULTIVITAMIN) tablet Take 1 tablet by mouth daily.      Marland Kitchen SYNTHROID 88 MCG tablet Take 1 tablet (88 mcg total) by mouth daily.  100 tablet  3   No current facility-administered medications for this visit.   Allergies as of 05/17/2013 - Review Complete 05/17/2013  Allergen Reaction Noted  . Erythromycin    . Penicillins      reports that he has never smoked. He has never used smokeless tobacco. He reports that he drinks alcohol. He reports that he does not use illicit drugs.     Review of Systems: Pertinent positive and negative review of systems were noted in the above HPI section. All other review of systems were otherwise negative.  Vital signs were reviewed in today's medical record Physical Exam: General: Well developed , well nourished, no acute  distress Skin: anicteric Head: Normocephalic and atraumatic Eyes:  sclerae anicteric, EOMI Ears: Normal auditory acuity Mouth: No deformity or lesions Neck: Supple, no masses or thyromegaly Lungs: Clear throughout to auscultation Heart: Regular rate and rhythm; no murmurs, rubs or bruits Abdomen: Soft, non tender and non distended. No masses, hepatosplenomegaly or hernias noted. Normal Bowel sounds Rectal:deferred Musculoskeletal: Symmetrical with no gross deformities  Skin: No lesions on visible extremities Pulses:  Normal pulses noted Extremities: No clubbing, cyanosis, edema or deformities noted Neurological: Alert oriented x 4, grossly nonfocal Cervical Nodes:  No significant cervical adenopathy Inguinal Nodes: No significant inguinal adenopathy Psychological:  Alert and cooperative. Normal mood and affect

## 2013-05-17 NOTE — Assessment & Plan Note (Signed)
Constipation is probably functional.  There has been a change over the past 3-4 years raising the question of a structural abnormality.  Recommendations #1 fiber supplementation daily; if unsuccessful will try Linzess #2 colonoscopy

## 2013-05-21 ENCOUNTER — Telehealth: Payer: Self-pay | Admitting: Gastroenterology

## 2013-05-21 MED ORDER — NA SULFATE-K SULFATE-MG SULF 17.5-3.13-1.6 GM/177ML PO SOLN: 1.0000 | Freq: Once | ORAL | Status: DC

## 2013-05-21 NOTE — Telephone Encounter (Signed)
Called pt to inform suprep just sent

## 2013-05-26 ENCOUNTER — Encounter: Payer: Self-pay | Admitting: Gastroenterology

## 2013-05-26 ENCOUNTER — Ambulatory Visit (AMBULATORY_SURGERY_CENTER): Payer: Medicare Other | Admitting: Gastroenterology

## 2013-05-26 VITALS — BP 119/76 | HR 49 | Temp 97.0°F | Resp 15 | Ht 71.0 in | Wt 220.0 lb

## 2013-05-26 DIAGNOSIS — R198 Other specified symptoms and signs involving the digestive system and abdomen: Secondary | ICD-10-CM | POA: Diagnosis not present

## 2013-05-26 DIAGNOSIS — K5900 Constipation, unspecified: Secondary | ICD-10-CM

## 2013-05-26 DIAGNOSIS — J45909 Unspecified asthma, uncomplicated: Secondary | ICD-10-CM | POA: Diagnosis not present

## 2013-05-26 DIAGNOSIS — K648 Other hemorrhoids: Secondary | ICD-10-CM | POA: Diagnosis not present

## 2013-05-26 DIAGNOSIS — D126 Benign neoplasm of colon, unspecified: Secondary | ICD-10-CM

## 2013-05-26 DIAGNOSIS — E039 Hypothyroidism, unspecified: Secondary | ICD-10-CM | POA: Diagnosis not present

## 2013-05-26 MED ORDER — SODIUM CHLORIDE 0.9 % IV SOLN: 500.0000 mL | INTRAVENOUS | Status: DC

## 2013-05-26 NOTE — Patient Instructions (Signed)
Colon polyps removed today, hemorrhoids seen. Handouts given on polyps,hemorrhoids, and high fiber diet.  Try to follow high fiber diet with liberal fluids intake. Resume current medications. Call us with any questions or concerns. Thank you!!  YOU HAD AN ENDOSCOPIC PROCEDURE TODAY AT THE [facility]: Refer to the procedure report that was given to you for any specific questions about what was found during the examination.  If the procedure report does not answer your questions, please call your gastroenterologist to clarify.  If you requested that your care partner not be given the details of your procedure findings, then the procedure report has been included in a sealed envelope for you to review at your convenience later.  YOU SHOULD EXPECT: Some feelings of bloating in the abdomen. Passage of more gas than usual.  Walking can help get rid of the air that was put into your GI tract during the procedure and reduce the bloating. If you had a lower endoscopy (such as a colonoscopy or flexible sigmoidoscopy) you may notice spotting of blood in your stool or on the toilet paper. If you underwent a bowel prep for your procedure, then you may not have a normal bowel movement for a few days.  DIET: Your first meal following the procedure should be a light meal and then it is ok to progress to your normal diet.  A half-sandwich or bowl of soup is an example of a good first meal.  Heavy or fried foods are harder to digest and may make you feel nauseous or bloated.  Likewise meals heavy in dairy and vegetables can cause extra gas to form and this can also increase the bloating.  Drink plenty of fluids but you should avoid alcoholic beverages for 24 hours.  ACTIVITY: Your care partner should take you home directly after the procedure.  You should plan to take it easy, moving slowly for the rest of the day.  You can resume normal activity the day after the procedure however you should NOT DRIVE or use  heavy machinery for 24 hours (because of the sedation medicines used during the test).    SYMPTOMS TO REPORT IMMEDIATELY: A gastroenterologist can be reached at any hour.  During normal business hours, 8:30 AM to 5:00 PM Monday through Friday, call [phone].  After hours and on weekends, please call the GI answering service at [phone] who will take a message and have the physician on call contact you.   Following lower endoscopy (colonoscopy or flexible sigmoidoscopy):  Excessive amounts of blood in the stool  Significant tenderness or worsening of abdominal pains  Swelling of the abdomen that is new, acute  Fever of 100F or higher  Following upper endoscopy (EGD)  Vomiting of blood or coffee ground material  New chest pain or pain under the shoulder blades  Painful or persistently difficult swallowing  New shortness of breath  Fever of 100F or higher  Black, tarry-looking stools  FOLLOW UP: If any biopsies were taken you will be contacted by phone or by letter within the next 1-3 weeks.  Call your gastroenterologist if you have not heard about the biopsies in 3 weeks.  Our staff will call the home number listed on your records the next business day following your procedure to check on you and address any questions or concerns that you may have at that time regarding the information given to you following your procedure. This is a courtesy call and so if there is no  answer at the home number and we have not heard from you through the emergency physician on call, we will assume that you have returned to your regular daily activities without incident.  SIGNATURES/CONFIDENTIALITY: You and/or your care partner have signed paperwork which will be entered into your electronic medical record.  These signatures attest to the fact that that the information above on your After Visit Summary has been reviewed and is understood.  Full responsibility of the confidentiality of this  discharge information lies with you and/or your care-partner.

## 2013-05-26 NOTE — Progress Notes (Signed)
Called to room to assist during endoscopic procedure.  Patient ID and intended procedure confirmed with present staff. Received instructions for my participation in the procedure from the performing physician.  

## 2013-05-26 NOTE — Op Note (Signed)
[facility] [address] [place], [address]   COLONOSCOPY PROCEDURE REPORT  PATIENT: Ryan, [name]  MR#: [id number] BIRTHDATE: 1945/08/26 , 67  yrs. old GENDER: Male ENDOSCOPIST: Louis Meckel, MD REFERRED BY: PROCEDURE DATE:  05/26/2013 PROCEDURE:   Colonoscopy with snare polypectomy and Colonoscopy with cold biopsy polypectomy First Screening Colonoscopy - Avg.  risk and is 50 yrs.  old or older - No.  Prior Negative Screening - Now for repeat screening. 10 or more years since last screening  History of Adenoma - Now for follow-up colonoscopy & has been > or = to 3 yrs.  N/A  Polyps Removed Today? Yes. ASA CLASS:   Class II INDICATIONS:Change in bowel habits. MEDICATIONS: MAC sedation, administered by CRNA and propofol (Diprivan) 400mg  IV  DESCRIPTION OF PROCEDURE:   After the risks benefits and alternatives of the procedure were thoroughly explained, informed consent was obtained.  A digital rectal exam revealed no abnormalities of the rectum.   The LB [id number]  endoscope was introduced through the anus and advanced to the cecum, which was identified by both the appendix and ileocecal valve. No adverse events experienced.   The quality of the prep was Suprep adequate The instrument was then slowly withdrawn as the colon was fully examined.      COLON FINDINGS: A sessile polyp measuring 2 mm in size was found at the cecum.  A polypectomy was performed with cold forceps.   A flat polyp measuring 3 mm in size was found in the sigmoid colon.  A polypectomy was performed with a cold snare.  The resection was complete and the polyp tissue was completely retrieved.   Internal hemorrhoids were found.  Retroflexed views revealed no abnormalities. The time to cecum=9 minutes 56 seconds.  Withdrawal time=16 minutes .5 seconds.  The scope was withdrawn and the procedure completed. COMPLICATIONS: There were no complications.  ENDOSCOPIC  IMPRESSION: 1.   Sessile polyp measuring 2 mm in size was found at the cecum; polypectomy was performed with cold forceps 2.   Flat polyp measuring 3 mm in size was found in the sigmoid colon; polypectomy was performed with a cold snare 3.   Internal hemorrhoids  RECOMMENDATIONS: 1.  If the polyp(s) removed today are proven to be adenomatous (pre-cancerous) polyps, you will need a repeat colonoscopy in 5 years.  Otherwise you should continue to follow colorectal cancer screening guidelines for "routine risk" patients with colonoscopy in 10 years.  You will receive a letter within 1-2 weeks with the results of your biopsy as well as final recommendations.  Please call my office if you have not received a letter after 3 weeks. 2.  High fiber diet with liberal fluid intake.   eSigned:  Louis Meckel, MD 05/26/2013 9:06 AM   cc: Roderick Pee, MD and Ancil Boozer MD   PATIENT NAME:  Ryan, [name] MR#: [id number]

## 2013-05-26 NOTE — Progress Notes (Signed)
Report to pacu RN, vss, bbs=clear, Patient preop 0800 had 3 puffs of inaler for c/o asthma, bbs=clear. Dr. Arlyce Dice at AM meeting will be here [phone]

## 2013-05-26 NOTE — Progress Notes (Signed)
Patient did not experience any of the following events: a burn prior to discharge; a fall within the facility; wrong site/side/patient/procedure/implant event; or a hospital transfer or hospital admission upon discharge from the facility. (G8907) Patient did not have preoperative order for IV antibiotic SSI prophylaxis. (G8918)  

## 2013-05-27 ENCOUNTER — Telehealth: Payer: Self-pay

## 2013-05-27 NOTE — Telephone Encounter (Signed)
  Follow up Call-  Call back number 05/26/2013  Post procedure Call Back phone  # [phone]  Permission to leave phone message Yes     Patient questions:  Do you have a fever, pain , or abdominal swelling? no Pain Score  0 *  Have you tolerated food without any problems? yes  Have you been able to return to your normal activities? yes  Do you have any questions about your discharge instructions: Diet   no Medications  no Follow up visit  no  Do you have questions or concerns about your Care? no  Actions: * If pain score is 4 or above: No action needed, pain <4.  Per the pt, "everyone was great yesterday".  No problems noted. Maw

## 2013-05-28 ENCOUNTER — Ambulatory Visit (INDEPENDENT_AMBULATORY_CARE_PROVIDER_SITE_OTHER): Payer: Medicare Other | Admitting: *Deleted

## 2013-05-28 DIAGNOSIS — Z23 Encounter for immunization: Secondary | ICD-10-CM | POA: Diagnosis not present

## 2013-05-28 DIAGNOSIS — Z2911 Encounter for prophylactic immunotherapy for respiratory syncytial virus (RSV): Secondary | ICD-10-CM

## 2013-06-01 ENCOUNTER — Encounter: Payer: Self-pay | Admitting: Gastroenterology

## 2013-07-14 DIAGNOSIS — H2589 Other age-related cataract: Secondary | ICD-10-CM | POA: Diagnosis not present

## 2013-07-14 DIAGNOSIS — H43819 Vitreous degeneration, unspecified eye: Secondary | ICD-10-CM | POA: Diagnosis not present

## 2013-07-14 DIAGNOSIS — H5319 Other subjective visual disturbances: Secondary | ICD-10-CM | POA: Diagnosis not present

## 2013-07-20 DIAGNOSIS — H43819 Vitreous degeneration, unspecified eye: Secondary | ICD-10-CM | POA: Diagnosis not present

## 2013-07-20 DIAGNOSIS — H2589 Other age-related cataract: Secondary | ICD-10-CM | POA: Diagnosis not present

## 2013-07-21 ENCOUNTER — Other Ambulatory Visit: Payer: Self-pay | Admitting: Family Medicine

## 2013-07-21 NOTE — Telephone Encounter (Signed)
Wife states that pt needs refill of HYDROcodone-homatropine [facility]) They are traveling to new [place].  Pt is NOT sick, but they want to take this med w/ them just in case. pls advise

## 2013-08-03 ENCOUNTER — Telehealth: Payer: Self-pay | Admitting: Family Medicine

## 2013-08-03 NOTE — Telephone Encounter (Signed)
Patient Information:  Caller Name: Ryan [name]  Phone: [phone]  Patient: Ryan, [name]  Gender: Male  DOB: 06/23/1946  Age: 67 Years  PCP: Kelle Darting [facility])  Office Follow Up:  Does the office need to follow up with this patient?: Yes  Instructions For The Office: No appts. available at any [place] location. Spouse states patient declines to be seen in the Urgent Care. Please return call to patient at [phone] regarding work in appt.  Patient uses Transport planner at [facility] at [phone].  RN Note:  Spouse states patient developed sore throat, fever, cough, chest congestion. Onset 08/02/13. Patient had Aspirin 650mg . at 1100 08/03/13. Temp. 100.8 orally at 1200 08/03/13. Patient taking fluids well. Patient has been taking Hydrocodone for cough with improvement. Patient is expectorating green sputum. No wheezing noted. Spouse states she called office 12/29/214 and patient was advised to go to Urgent Care. Spouse states patient declined to be seen in the Urgent Care 08/02/13.  Care advice given per guidelines. Call back parameters reviewed. Spouse verbalizes understanding.  No appts. available at any [place] location. Spouse states patient declines to be seen in the Urgent Care. Please return call to patient at [phone] regarding work in appt.  Patient uses Transport planner at [facility] at [phone].  Symptoms  Reason For Call & Symptoms: Sore Throat, cough, chest congestion, fever  Reviewed Health History In EMR: Yes  Reviewed Medications In EMR: Yes  Reviewed Allergies In EMR: Yes  Reviewed Surgeries / Procedures: Yes  Date of Onset of Symptoms: 08/02/2013  Treatments Tried: Hydrocodone, ASA  Treatments Tried Worked: Yes  Any Fever: Yes  Fever Taken: Oral  Fever Time Of Reading: 12:00:00  Fever Last Reading: 100.8  Guideline(s) Used:  Cough  Disposition Per Guideline:   Go to Office Now  Reason For Disposition Reached:   Fever >  100.5 F (38.1 C) and over 46 years of age  Advice Given:  Coughing Spasms:  Drink warm fluids. Inhale warm mist (Reason: both relax the airway and loosen up the phlegm).  Suck on cough drops or hard candy to coat the irritated throat.  Prevent Dehydration:  Drink adequate liquids.  This will help soothe an irritated or dry throat and loosen up the phlegm.  Avoid Tobacco Smoke:  Smoking or being exposed to smoke makes coughs much worse.  Call Back If:  Difficulty breathing  You become worse.  Patient Will Follow Care Advice:  YES

## 2013-08-04 NOTE — Telephone Encounter (Signed)
Spoke with wife and she states that "he is on the upswing.  I believe we are doing better".

## 2013-09-01 DIAGNOSIS — H5319 Other subjective visual disturbances: Secondary | ICD-10-CM | POA: Diagnosis not present

## 2013-09-01 DIAGNOSIS — H2589 Other age-related cataract: Secondary | ICD-10-CM | POA: Diagnosis not present

## 2013-09-01 DIAGNOSIS — H43819 Vitreous degeneration, unspecified eye: Secondary | ICD-10-CM | POA: Diagnosis not present

## 2014-03-15 DIAGNOSIS — L820 Inflamed seborrheic keratosis: Secondary | ICD-10-CM | POA: Diagnosis not present

## 2014-03-15 DIAGNOSIS — L739 Follicular disorder, unspecified: Secondary | ICD-10-CM | POA: Diagnosis not present

## 2014-03-15 DIAGNOSIS — L908 Other atrophic disorders of skin: Secondary | ICD-10-CM | POA: Diagnosis not present

## 2014-03-15 DIAGNOSIS — Z85828 Personal history of other malignant neoplasm of skin: Secondary | ICD-10-CM | POA: Diagnosis not present

## 2014-03-15 DIAGNOSIS — D1801 Hemangioma of skin and subcutaneous tissue: Secondary | ICD-10-CM | POA: Diagnosis not present

## 2014-03-15 DIAGNOSIS — L819 Disorder of pigmentation, unspecified: Secondary | ICD-10-CM | POA: Diagnosis not present

## 2014-03-15 DIAGNOSIS — L570 Actinic keratosis: Secondary | ICD-10-CM | POA: Diagnosis not present

## 2014-03-15 DIAGNOSIS — L821 Other seborrheic keratosis: Secondary | ICD-10-CM | POA: Diagnosis not present

## 2014-03-15 DIAGNOSIS — L918 Other hypertrophic disorders of the skin: Secondary | ICD-10-CM | POA: Diagnosis not present

## 2014-04-30 DIAGNOSIS — Z23 Encounter for immunization: Secondary | ICD-10-CM | POA: Diagnosis not present

## 2014-05-17 ENCOUNTER — Encounter: Payer: Self-pay | Admitting: Family Medicine

## 2014-05-17 ENCOUNTER — Ambulatory Visit (INDEPENDENT_AMBULATORY_CARE_PROVIDER_SITE_OTHER): Payer: Medicare Other | Admitting: Family Medicine

## 2014-05-17 ENCOUNTER — Other Ambulatory Visit: Payer: Self-pay | Admitting: *Deleted

## 2014-05-17 VITALS — BP 110/80 | Temp 97.5°F | Ht 72.25 in | Wt 220.0 lb

## 2014-05-17 DIAGNOSIS — N401 Enlarged prostate with lower urinary tract symptoms: Secondary | ICD-10-CM | POA: Insufficient documentation

## 2014-05-17 DIAGNOSIS — R351 Nocturia: Secondary | ICD-10-CM

## 2014-05-17 DIAGNOSIS — E038 Other specified hypothyroidism: Secondary | ICD-10-CM

## 2014-05-17 DIAGNOSIS — E031 Congenital hypothyroidism without goiter: Secondary | ICD-10-CM | POA: Diagnosis not present

## 2014-05-17 DIAGNOSIS — E039 Hypothyroidism, unspecified: Secondary | ICD-10-CM

## 2014-05-17 DIAGNOSIS — J4520 Mild intermittent asthma, uncomplicated: Secondary | ICD-10-CM

## 2014-05-17 DIAGNOSIS — E032 Hypothyroidism due to medicaments and other exogenous substances: Secondary | ICD-10-CM

## 2014-05-17 DIAGNOSIS — Z23 Encounter for immunization: Secondary | ICD-10-CM

## 2014-05-17 DIAGNOSIS — E033 Postinfectious hypothyroidism: Secondary | ICD-10-CM

## 2014-05-17 DIAGNOSIS — J302 Other seasonal allergic rhinitis: Secondary | ICD-10-CM

## 2014-05-17 DIAGNOSIS — L820 Inflamed seborrheic keratosis: Secondary | ICD-10-CM

## 2014-05-17 LAB — CBC WITH DIFFERENTIAL/PLATELET
BASOS ABS: 0 10*3/uL (ref 0.0–0.1)
Basophils Relative: 0.4 % (ref 0.0–3.0)
Eosinophils Absolute: 0.1 10*3/uL (ref 0.0–0.7)
Eosinophils Relative: 1.7 % (ref 0.0–5.0)
HCT: 45.5 % (ref 39.0–52.0)
Hemoglobin: 14.9 g/dL (ref 13.0–17.0)
LYMPHS PCT: 22.5 % (ref 12.0–46.0)
Lymphs Abs: 1.4 10*3/uL (ref 0.7–4.0)
MCHC: 32.8 g/dL (ref 30.0–36.0)
MCV: 94.2 fl (ref 78.0–100.0)
Monocytes Absolute: 0.5 10*3/uL (ref 0.1–1.0)
Monocytes Relative: 7.7 % (ref 3.0–12.0)
NEUTROS ABS: 4.2 10*3/uL (ref 1.4–7.7)
Neutrophils Relative %: 67.7 % (ref 43.0–77.0)
PLATELETS: 270 10*3/uL (ref 150.0–400.0)
RBC: 4.82 Mil/uL (ref 4.22–5.81)
RDW: 13 % (ref 11.5–15.5)
WBC: 6.2 10*3/uL (ref 4.0–10.5)

## 2014-05-17 LAB — LIPID PANEL
CHOLESTEROL: 209 mg/dL — AB (ref 0–200)
HDL: 39.2 mg/dL (ref >39.00)
LDL Cholesterol: 150 mg/dL — ABNORMAL HIGH (ref 0–99)
NonHDL: 169.8
TRIGLYCERIDES: 99 mg/dL (ref 0.0–149.0)
Total CHOL/HDL Ratio: 5
VLDL: 19.8 mg/dL (ref 0.0–40.0)

## 2014-05-17 LAB — BASIC METABOLIC PANEL
BUN: 26 mg/dL — AB (ref 6–23)
CALCIUM: 9.7 mg/dL (ref 8.4–10.5)
CO2: 26 mEq/L (ref 19–32)
Chloride: 103 mEq/L (ref 96–112)
Creatinine, Ser: 1.1 mg/dL (ref 0.4–1.5)
GFR: 73.49 mL/min (ref >60.00)
Glucose, Bld: 90 mg/dL (ref 70–99)
Potassium: 5.5 mEq/L — ABNORMAL HIGH (ref 3.5–5.1)
SODIUM: 139 meq/L (ref 135–145)

## 2014-05-17 LAB — POCT URINALYSIS DIPSTICK
Bilirubin, UA: NEGATIVE
Blood, UA: NEGATIVE
GLUCOSE UA: NEGATIVE
Ketones, UA: NEGATIVE
Leukocytes, UA: NEGATIVE
NITRITE UA: NEGATIVE
Spec Grav, UA: 1.02
UROBILINOGEN UA: 1
pH, UA: 6.5

## 2014-05-17 LAB — HEPATIC FUNCTION PANEL
ALK PHOS: 65 U/L (ref 39–117)
ALT: 21 U/L (ref 0–53)
AST: 19 U/L (ref 0–37)
Albumin: 3.6 g/dL (ref 3.5–5.2)
Bilirubin, Direct: 0.1 mg/dL (ref 0.0–0.3)
Total Bilirubin: 0.9 mg/dL (ref 0.2–1.2)
Total Protein: 7.1 g/dL (ref 6.0–8.3)

## 2014-05-17 LAB — TSH: TSH: 4.49 u[IU]/mL (ref 0.35–4.50)

## 2014-05-17 LAB — PSA: PSA: 2.62 ng/mL (ref 0.10–4.00)

## 2014-05-17 MED ORDER — SYNTHROID 88 MCG PO TABS: 88.0000 ug | ORAL_TABLET | Freq: Every day | ORAL | Status: DC

## 2014-05-17 NOTE — Patient Instructions (Signed)
Continue thyroid medication daily  Begin a walking or swimming program daily  If you want to pursue treatment for your back pain please call and we will get you set up to see Marlowe Kays [place]ree physical therapist  Return in one year sooner if any problems  Labs today

## 2014-05-17 NOTE — Progress Notes (Signed)
   Subjective:    Patient ID: Ryan [name], male    DOB: 1946/03/13, 68 y.o.   MRN: [id number]  HPI Ryan [name] is a 68 year old married male nonsmoker who comes in today for evaluation of 3 problems  He has a history of hypothyroidism and takes Synthroid 88 mcg daily. He feels like he has no energy. He is taking his medication daily. However his not walking or doing any exercise except playing golf intermittently.  He's had problems with his back for many many years. I recommend we begin a program starting with physical therapy he wants to think about it  He takes albuterol when necessary for asthma which is only once or twice a year when he gets a bad cold.  He gets routine eye care, dental care, colonoscopy and GI, and update of vaccinations by Apolonio Schneiders  Cognitive function normal he does not exercise home health safety reviewed no issues identified, no guns in the house, he does have a health care power of attorney and living will   Review of Systems  Constitutional: Negative.   HENT: Negative.   Eyes: Negative.   Respiratory: Negative.   Cardiovascular: Negative.   Gastrointestinal: Negative.   Endocrine: Negative.   Genitourinary: Negative.   Musculoskeletal: Negative.   Skin: Negative.   Allergic/Immunologic: Negative.   Neurological: Negative.   Hematological: Negative.   Psychiatric/Behavioral: Negative.        Objective:   Physical Exam  Nursing note and vitals reviewed. Constitutional: He is oriented to person, place, and time. He appears well-developed and well-nourished.  HENT:  Head: Normocephalic and atraumatic.  Right Ear: External ear normal.  Left Ear: External ear normal.  Nose: Nose normal.  Mouth/Throat: Oropharynx is clear and moist.  Eyes: Conjunctivae and EOM are normal. Pupils are equal, round, and reactive to light.  Neck: Normal range of motion. Neck supple. No JVD present. No tracheal deviation present. No thyromegaly present.    Cardiovascular: Normal rate, regular rhythm, normal heart sounds and intact distal pulses.  Exam reveals no gallop and no friction rub.   No murmur heard. No carotid nor aortic bruits peripheral pulses 2+ and symmetrical  Pulmonary/Chest: Effort normal and breath sounds normal. No stridor. No respiratory distress. He has no wheezes. He has no rales. He exhibits no tenderness.  Abdominal: Soft. Bowel sounds are normal. He exhibits no distension and no mass. There is no tenderness. There is no rebound and no guarding.  Genitourinary: Rectum normal and penis normal. Guaiac negative stool. No penile tenderness.  2+ symmetrical nonnodular BPH  Musculoskeletal: Normal range of motion. He exhibits no edema and no tenderness.  Lymphadenopathy:    He has no cervical adenopathy.  Neurological: He is alert and oriented to person, place, and time. He has normal reflexes. No cranial nerve deficit. He exhibits normal muscle tone.  Skin: Skin is warm and dry. No rash noted. No erythema. No pallor.  Total body skin exam normal  Psychiatric: He has a normal mood and affect. His behavior is normal. Judgment and thought content normal.          Assessment & Plan:  Healthy male  Hypothyroidism,,,,,,,, continue Synthroid 88 mcg check labs,,,,,,,,,,,, intermittent asthma albuterol when necessary  Chronic low back pain,,,,,,,,,, recommended treatment program beginning with physical therapy patient will think about it,

## 2014-05-17 NOTE — Progress Notes (Signed)
Pre visit review using our clinic review tool, if applicable. No additional management support is needed unless otherwise documented below in the visit note. 

## 2014-06-07 ENCOUNTER — Encounter: Payer: Medicare Other | Admitting: Family Medicine

## 2014-09-26 DIAGNOSIS — H01025 Squamous blepharitis left lower eyelid: Secondary | ICD-10-CM | POA: Diagnosis not present

## 2014-09-26 DIAGNOSIS — H01024 Squamous blepharitis left upper eyelid: Secondary | ICD-10-CM | POA: Diagnosis not present

## 2014-09-26 DIAGNOSIS — H02831 Dermatochalasis of right upper eyelid: Secondary | ICD-10-CM | POA: Diagnosis not present

## 2014-09-26 DIAGNOSIS — H02834 Dermatochalasis of left upper eyelid: Secondary | ICD-10-CM | POA: Diagnosis not present

## 2014-09-26 DIAGNOSIS — H01022 Squamous blepharitis right lower eyelid: Secondary | ICD-10-CM | POA: Diagnosis not present

## 2014-09-26 DIAGNOSIS — H01021 Squamous blepharitis right upper eyelid: Secondary | ICD-10-CM | POA: Diagnosis not present

## 2014-09-26 DIAGNOSIS — H25813 Combined forms of age-related cataract, bilateral: Secondary | ICD-10-CM | POA: Diagnosis not present

## 2015-03-07 DIAGNOSIS — M5442 Lumbago with sciatica, left side: Secondary | ICD-10-CM | POA: Diagnosis not present

## 2015-03-07 DIAGNOSIS — M9903 Segmental and somatic dysfunction of lumbar region: Secondary | ICD-10-CM | POA: Diagnosis not present

## 2015-03-07 DIAGNOSIS — M5136 Other intervertebral disc degeneration, lumbar region: Secondary | ICD-10-CM | POA: Diagnosis not present

## 2015-03-07 DIAGNOSIS — M5032 Other cervical disc degeneration, mid-cervical region: Secondary | ICD-10-CM | POA: Diagnosis not present

## 2015-03-07 DIAGNOSIS — M9901 Segmental and somatic dysfunction of cervical region: Secondary | ICD-10-CM | POA: Diagnosis not present

## 2015-03-07 DIAGNOSIS — M9902 Segmental and somatic dysfunction of thoracic region: Secondary | ICD-10-CM | POA: Diagnosis not present

## 2015-03-08 DIAGNOSIS — M5442 Lumbago with sciatica, left side: Secondary | ICD-10-CM | POA: Diagnosis not present

## 2015-03-08 DIAGNOSIS — M9901 Segmental and somatic dysfunction of cervical region: Secondary | ICD-10-CM | POA: Diagnosis not present

## 2015-03-08 DIAGNOSIS — M9902 Segmental and somatic dysfunction of thoracic region: Secondary | ICD-10-CM | POA: Diagnosis not present

## 2015-03-08 DIAGNOSIS — M5032 Other cervical disc degeneration, mid-cervical region: Secondary | ICD-10-CM | POA: Diagnosis not present

## 2015-03-08 DIAGNOSIS — M5136 Other intervertebral disc degeneration, lumbar region: Secondary | ICD-10-CM | POA: Diagnosis not present

## 2015-03-08 DIAGNOSIS — M9903 Segmental and somatic dysfunction of lumbar region: Secondary | ICD-10-CM | POA: Diagnosis not present

## 2015-04-17 DIAGNOSIS — L821 Other seborrheic keratosis: Secondary | ICD-10-CM | POA: Diagnosis not present

## 2015-04-17 DIAGNOSIS — Z85828 Personal history of other malignant neoplasm of skin: Secondary | ICD-10-CM | POA: Diagnosis not present

## 2015-04-17 DIAGNOSIS — L820 Inflamed seborrheic keratosis: Secondary | ICD-10-CM | POA: Diagnosis not present

## 2015-04-17 DIAGNOSIS — L812 Freckles: Secondary | ICD-10-CM | POA: Diagnosis not present

## 2015-04-17 DIAGNOSIS — D1801 Hemangioma of skin and subcutaneous tissue: Secondary | ICD-10-CM | POA: Diagnosis not present

## 2015-04-17 DIAGNOSIS — L570 Actinic keratosis: Secondary | ICD-10-CM | POA: Diagnosis not present

## 2015-04-17 DIAGNOSIS — L720 Epidermal cyst: Secondary | ICD-10-CM | POA: Diagnosis not present

## 2015-04-17 DIAGNOSIS — L919 Hypertrophic disorder of the skin, unspecified: Secondary | ICD-10-CM | POA: Diagnosis not present

## 2015-05-26 DIAGNOSIS — Z23 Encounter for immunization: Secondary | ICD-10-CM | POA: Diagnosis not present

## 2015-05-30 ENCOUNTER — Encounter: Payer: No Typology Code available for payment source | Admitting: Family Medicine

## 2015-06-05 ENCOUNTER — Ambulatory Visit (INDEPENDENT_AMBULATORY_CARE_PROVIDER_SITE_OTHER): Payer: Medicare Other | Admitting: Family Medicine

## 2015-06-05 ENCOUNTER — Encounter: Payer: Self-pay | Admitting: Family Medicine

## 2015-06-05 VITALS — BP 120/88 | Temp 98.1°F | Ht 72.5 in | Wt 224.1 lb

## 2015-06-05 DIAGNOSIS — E032 Hypothyroidism due to medicaments and other exogenous substances: Secondary | ICD-10-CM

## 2015-06-05 DIAGNOSIS — Z0000 Encounter for general adult medical examination without abnormal findings: Secondary | ICD-10-CM | POA: Diagnosis not present

## 2015-06-05 DIAGNOSIS — L820 Inflamed seborrheic keratosis: Secondary | ICD-10-CM | POA: Diagnosis not present

## 2015-06-05 DIAGNOSIS — N401 Enlarged prostate with lower urinary tract symptoms: Secondary | ICD-10-CM

## 2015-06-05 DIAGNOSIS — E039 Hypothyroidism, unspecified: Secondary | ICD-10-CM

## 2015-06-05 DIAGNOSIS — R351 Nocturia: Secondary | ICD-10-CM | POA: Diagnosis not present

## 2015-06-05 DIAGNOSIS — J302 Other seasonal allergic rhinitis: Secondary | ICD-10-CM | POA: Diagnosis not present

## 2015-06-05 DIAGNOSIS — E038 Other specified hypothyroidism: Secondary | ICD-10-CM

## 2015-06-05 DIAGNOSIS — Z23 Encounter for immunization: Secondary | ICD-10-CM | POA: Diagnosis not present

## 2015-06-05 LAB — POCT URINALYSIS DIPSTICK
Bilirubin, UA: NEGATIVE
Blood, UA: NEGATIVE
Glucose, UA: NEGATIVE
Ketones, UA: NEGATIVE
LEUKOCYTES UA: NEGATIVE
NITRITE UA: NEGATIVE
PH UA: 7
Spec Grav, UA: 1.02
UROBILINOGEN UA: 1

## 2015-06-05 LAB — BASIC METABOLIC PANEL
BUN: 20 mg/dL (ref 6–23)
CHLORIDE: 103 meq/L (ref 96–112)
CO2: 30 mEq/L (ref 19–32)
Calcium: 10.3 mg/dL (ref 8.4–10.5)
Creatinine, Ser: 0.95 mg/dL (ref 0.40–1.50)
GFR: 83.14 mL/min (ref >60.00)
Glucose, Bld: 100 mg/dL — ABNORMAL HIGH (ref 70–99)
POTASSIUM: 5.2 meq/L — AB (ref 3.5–5.1)
Sodium: 141 mEq/L (ref 135–145)

## 2015-06-05 LAB — CBC WITH DIFFERENTIAL/PLATELET
BASOS ABS: 0 10*3/uL (ref 0.0–0.1)
Basophils Relative: 0.6 % (ref 0.0–3.0)
EOS ABS: 0.1 10*3/uL (ref 0.0–0.7)
EOS PCT: 1.6 % (ref 0.0–5.0)
HCT: 45.5 % (ref 39.0–52.0)
HEMOGLOBIN: 15.1 g/dL (ref 13.0–17.0)
Lymphocytes Relative: 14.8 % (ref 12.0–46.0)
Lymphs Abs: 1 10*3/uL (ref 0.7–4.0)
MCHC: 33.2 g/dL (ref 30.0–36.0)
MCV: 94.4 fl (ref 78.0–100.0)
MONO ABS: 0.4 10*3/uL (ref 0.1–1.0)
Monocytes Relative: 6.5 % (ref 3.0–12.0)
Neutro Abs: 5.1 10*3/uL (ref 1.4–7.7)
Neutrophils Relative %: 76.5 % (ref 43.0–77.0)
Platelets: 289 10*3/uL (ref 150.0–400.0)
RBC: 4.82 Mil/uL (ref 4.22–5.81)
RDW: 12.9 % (ref 11.5–15.5)
WBC: 6.7 10*3/uL (ref 4.0–10.5)

## 2015-06-05 LAB — HEPATIC FUNCTION PANEL
ALBUMIN: 4.1 g/dL (ref 3.5–5.2)
ALK PHOS: 73 U/L (ref 39–117)
ALT: 23 U/L (ref 0–53)
AST: 15 U/L (ref 0–37)
BILIRUBIN DIRECT: 0.1 mg/dL (ref 0.0–0.3)
BILIRUBIN TOTAL: 0.6 mg/dL (ref 0.2–1.2)
TOTAL PROTEIN: 6.8 g/dL (ref 6.0–8.3)

## 2015-06-05 LAB — LIPID PANEL
CHOLESTEROL: 217 mg/dL — AB (ref 0–200)
HDL: 49.4 mg/dL (ref >39.00)
LDL CALC: 144 mg/dL — AB (ref 0–99)
NonHDL: 167.47
Total CHOL/HDL Ratio: 4
Triglycerides: 117 mg/dL (ref 0.0–149.0)
VLDL: 23.4 mg/dL (ref 0.0–40.0)

## 2015-06-05 LAB — TSH: TSH: 4.66 u[IU]/mL — AB (ref 0.35–4.50)

## 2015-06-05 LAB — PSA: PSA: 2.88 ng/mL (ref 0.10–4.00)

## 2015-06-05 MED ORDER — HYDROCODONE-HOMATROPINE 5-1.5 MG/5ML PO SYRP: 5.0000 mL | ORAL_SOLUTION | Freq: Three times a day (TID) | ORAL | Status: DC | PRN

## 2015-06-05 MED ORDER — SYNTHROID 88 MCG PO TABS: 88.0000 ug | ORAL_TABLET | Freq: Every day | ORAL | Status: DC

## 2015-06-05 NOTE — Progress Notes (Signed)
   Subjective:    Patient ID: Ryan [name], male    DOB: 08-Jan-1946, 69 y.o.   MRN: [id number]  HPI Mr. Sarli is a 69 year old married male nonsmoker who comes in today for general physical examination because of a history of hypothyroidism  He takes Synthroid 88 g daily we'll check labs today  He gets routine eye care, dental care, colonoscopy 2014 showed some polyps. He was advised to go back every 5 years  Vaccination history up-to-date Pneumovax 13 given today  Cognitive function normal he walks daily home health safety reviewed no issues identified, no guns in the house, he does have a healthcare power of attorney and living well  For the past 5 years she's had trouble with left-sided back pain. He describes as sharp and radiates down the dorsum of his left foot. He's been to orthopedics and chiropractic with no improvement. I recommend he see a neurosurgeon.   Review of Systems  Constitutional: Negative.   HENT: Negative.   Eyes: Negative.   Respiratory: Negative.   Cardiovascular: Negative.   Gastrointestinal: Negative.   Endocrine: Negative.   Genitourinary: Negative.   Musculoskeletal: Negative.   Skin: Negative.   Allergic/Immunologic: Negative.   Neurological: Negative.   Hematological: Negative.   Psychiatric/Behavioral: Negative.        Objective:   Physical Exam  Constitutional: He is oriented to person, place, and time. He appears well-developed and well-nourished.  HENT:  Head: Normocephalic and atraumatic.  Right Ear: External ear normal.  Left Ear: External ear normal.  Nose: Nose normal.  Mouth/Throat: Oropharynx is clear and moist.  Eyes: Conjunctivae and EOM are normal. Pupils are equal, round, and reactive to light.  Neck: Normal range of motion. Neck supple. No JVD present. No tracheal deviation present. No thyromegaly present.  Cardiovascular: Normal rate, regular rhythm, normal heart sounds and intact distal pulses.  Exam reveals no gallop  and no friction rub.   No murmur heard. No carotid neurologic bruits for full pulses 2+ and symmetrical  Pulmonary/Chest: Effort normal and breath sounds normal. No stridor. No respiratory distress. He has no wheezes. He has no rales. He exhibits no tenderness.  Abdominal: Soft. Bowel sounds are normal. He exhibits no distension and no mass. There is no tenderness. There is no rebound and no guarding.  Genitourinary: Rectum normal. Guaiac negative stool. No penile tenderness.  2+ nonnodular BPH  Musculoskeletal: Normal range of motion. He exhibits no edema or tenderness.  Lymphadenopathy:    He has no cervical adenopathy.  Neurological: He is alert and oriented to person, place, and time. He has normal reflexes. No cranial nerve deficit. He exhibits normal muscle tone.  Skin: Skin is warm and dry. No rash noted. No erythema. No pallor.  Psychiatric: He has a normal mood and affect. His behavior is normal. Judgment and thought content normal.  Nursing note and vitals reviewed.         Assessment & Plan:  Healthy male  Hypothyroidism..... Renew medication check labs  BPH with nocturia....... check labs  Lumbar disc disease..........Marland Kitchen refer to neurosurgery for further evaluation

## 2015-06-05 NOTE — Patient Instructions (Signed)
Continue current medications  Call the neurosurgical office,,,,,,,, asked to see Dr. Ellene Route or Dr. Cyndy Freeze for evaluation of your back pain  [facility]......... and [place]..... Are 2 new adult nurse practitioner's who will be taking over for me.   When you schedule your physical exam in July make it with one of those 2 folks

## 2015-06-05 NOTE — Progress Notes (Signed)
Pre visit review using our clinic review tool, if applicable. No additional management support is needed unless otherwise documented below in the visit note. 

## 2015-08-25 ENCOUNTER — Other Ambulatory Visit: Payer: Self-pay | Admitting: Neurosurgery

## 2015-08-25 DIAGNOSIS — M5416 Radiculopathy, lumbar region: Secondary | ICD-10-CM

## 2015-08-25 DIAGNOSIS — Z6830 Body mass index (BMI) 30.0-30.9, adult: Secondary | ICD-10-CM | POA: Diagnosis not present

## 2015-08-25 DIAGNOSIS — M545 Low back pain: Secondary | ICD-10-CM | POA: Diagnosis not present

## 2015-08-29 ENCOUNTER — Ambulatory Visit
Admission: RE | Admit: 2015-08-29 | Discharge: 2015-08-29 | Disposition: A | Discharge status: Home / Self Care | Payer: Medicare Other | Source: Ambulatory Visit | Attending: Neurosurgery | Admitting: Neurosurgery

## 2015-08-29 DIAGNOSIS — M5416 Radiculopathy, lumbar region: Secondary | ICD-10-CM

## 2015-08-29 DIAGNOSIS — M4806 Spinal stenosis, lumbar region: Secondary | ICD-10-CM | POA: Diagnosis not present

## 2015-09-14 DIAGNOSIS — M5416 Radiculopathy, lumbar region: Secondary | ICD-10-CM | POA: Diagnosis not present

## 2015-09-14 DIAGNOSIS — M545 Low back pain: Secondary | ICD-10-CM | POA: Diagnosis not present

## 2015-09-14 DIAGNOSIS — Z6831 Body mass index (BMI) 31.0-31.9, adult: Secondary | ICD-10-CM | POA: Diagnosis not present

## 2015-10-02 DIAGNOSIS — H25813 Combined forms of age-related cataract, bilateral: Secondary | ICD-10-CM | POA: Diagnosis not present

## 2015-10-02 DIAGNOSIS — H01024 Squamous blepharitis left upper eyelid: Secondary | ICD-10-CM | POA: Diagnosis not present

## 2015-10-02 DIAGNOSIS — H01022 Squamous blepharitis right lower eyelid: Secondary | ICD-10-CM | POA: Diagnosis not present

## 2015-10-02 DIAGNOSIS — H01021 Squamous blepharitis right upper eyelid: Secondary | ICD-10-CM | POA: Diagnosis not present

## 2015-10-02 DIAGNOSIS — H04123 Dry eye syndrome of bilateral lacrimal glands: Secondary | ICD-10-CM | POA: Diagnosis not present

## 2015-10-02 DIAGNOSIS — H01025 Squamous blepharitis left lower eyelid: Secondary | ICD-10-CM | POA: Diagnosis not present

## 2016-02-20 DIAGNOSIS — H01024 Squamous blepharitis left upper eyelid: Secondary | ICD-10-CM | POA: Diagnosis not present

## 2016-02-20 DIAGNOSIS — H01025 Squamous blepharitis left lower eyelid: Secondary | ICD-10-CM | POA: Diagnosis not present

## 2016-02-20 DIAGNOSIS — H01021 Squamous blepharitis right upper eyelid: Secondary | ICD-10-CM | POA: Diagnosis not present

## 2016-02-20 DIAGNOSIS — H04123 Dry eye syndrome of bilateral lacrimal glands: Secondary | ICD-10-CM | POA: Diagnosis not present

## 2016-02-20 DIAGNOSIS — H01022 Squamous blepharitis right lower eyelid: Secondary | ICD-10-CM | POA: Diagnosis not present

## 2016-02-20 DIAGNOSIS — H43393 Other vitreous opacities, bilateral: Secondary | ICD-10-CM | POA: Diagnosis not present

## 2016-02-20 DIAGNOSIS — H25813 Combined forms of age-related cataract, bilateral: Secondary | ICD-10-CM | POA: Diagnosis not present

## 2016-02-26 DIAGNOSIS — H2511 Age-related nuclear cataract, right eye: Secondary | ICD-10-CM | POA: Diagnosis not present

## 2016-02-26 DIAGNOSIS — H2512 Age-related nuclear cataract, left eye: Secondary | ICD-10-CM | POA: Diagnosis not present

## 2016-02-26 DIAGNOSIS — H269 Unspecified cataract: Secondary | ICD-10-CM | POA: Diagnosis not present

## 2016-03-04 DIAGNOSIS — H2512 Age-related nuclear cataract, left eye: Secondary | ICD-10-CM | POA: Diagnosis not present

## 2016-03-05 HISTORY — PX: OTHER SURGICAL HISTORY: SHX169

## 2016-03-11 DIAGNOSIS — H2512 Age-related nuclear cataract, left eye: Secondary | ICD-10-CM | POA: Diagnosis not present

## 2016-03-11 DIAGNOSIS — H25012 Cortical age-related cataract, left eye: Secondary | ICD-10-CM | POA: Diagnosis not present

## 2016-04-03 ENCOUNTER — Telehealth: Payer: Self-pay | Admitting: Family Medicine

## 2016-04-03 NOTE — Telephone Encounter (Signed)
Pt needs hep c blood work is this ok please put order in

## 2016-04-05 NOTE — Telephone Encounter (Signed)
Will verify with Dr.Todd and contact pt accordingly.

## 2016-04-09 NOTE — Telephone Encounter (Signed)
Called and spoke with pt. Pt expressed that he really needs a physical exam. I explained to him that Dr.Todd's schedule is full until next year. I informed pt that he can wait and be seen by Dr. Sherren Mocha or to make an appointment to establish with a new PCP her in the office. Pt verbalized understanding and will contact the office upon his return to [place].

## 2016-04-16 DIAGNOSIS — Z23 Encounter for immunization: Secondary | ICD-10-CM | POA: Diagnosis not present

## 2016-04-18 ENCOUNTER — Encounter: Payer: Self-pay | Admitting: Emergency Medicine

## 2016-04-30 DIAGNOSIS — L812 Freckles: Secondary | ICD-10-CM | POA: Diagnosis not present

## 2016-04-30 DIAGNOSIS — Z85828 Personal history of other malignant neoplasm of skin: Secondary | ICD-10-CM | POA: Diagnosis not present

## 2016-04-30 DIAGNOSIS — L821 Other seborrheic keratosis: Secondary | ICD-10-CM | POA: Diagnosis not present

## 2016-04-30 DIAGNOSIS — D485 Neoplasm of uncertain behavior of skin: Secondary | ICD-10-CM | POA: Diagnosis not present

## 2016-04-30 DIAGNOSIS — D1801 Hemangioma of skin and subcutaneous tissue: Secondary | ICD-10-CM | POA: Diagnosis not present

## 2016-04-30 DIAGNOSIS — C44519 Basal cell carcinoma of skin of other part of trunk: Secondary | ICD-10-CM | POA: Diagnosis not present

## 2016-04-30 DIAGNOSIS — L570 Actinic keratosis: Secondary | ICD-10-CM | POA: Diagnosis not present

## 2016-06-27 ENCOUNTER — Other Ambulatory Visit: Payer: Self-pay | Admitting: Family Medicine

## 2016-06-27 DIAGNOSIS — J302 Other seasonal allergic rhinitis: Secondary | ICD-10-CM

## 2016-06-27 DIAGNOSIS — E038 Other specified hypothyroidism: Secondary | ICD-10-CM

## 2016-06-27 DIAGNOSIS — E039 Hypothyroidism, unspecified: Secondary | ICD-10-CM

## 2016-06-27 DIAGNOSIS — Z23 Encounter for immunization: Secondary | ICD-10-CM

## 2016-06-27 DIAGNOSIS — L820 Inflamed seborrheic keratosis: Secondary | ICD-10-CM

## 2016-06-27 DIAGNOSIS — E032 Hypothyroidism due to medicaments and other exogenous substances: Secondary | ICD-10-CM

## 2016-08-28 ENCOUNTER — Encounter: Payer: Self-pay | Admitting: Family Medicine

## 2016-08-28 ENCOUNTER — Ambulatory Visit (INDEPENDENT_AMBULATORY_CARE_PROVIDER_SITE_OTHER): Payer: Medicare Other | Admitting: Family Medicine

## 2016-08-28 VITALS — BP 128/90 | HR 60 | Temp 97.8°F | Ht 72.0 in | Wt 226.2 lb

## 2016-08-28 DIAGNOSIS — Z23 Encounter for immunization: Secondary | ICD-10-CM

## 2016-08-28 DIAGNOSIS — J301 Allergic rhinitis due to pollen: Secondary | ICD-10-CM

## 2016-08-28 DIAGNOSIS — E038 Other specified hypothyroidism: Secondary | ICD-10-CM

## 2016-08-28 DIAGNOSIS — Z85828 Personal history of other malignant neoplasm of skin: Secondary | ICD-10-CM | POA: Insufficient documentation

## 2016-08-28 DIAGNOSIS — E039 Hypothyroidism, unspecified: Secondary | ICD-10-CM

## 2016-08-28 DIAGNOSIS — N401 Enlarged prostate with lower urinary tract symptoms: Secondary | ICD-10-CM | POA: Diagnosis not present

## 2016-08-28 DIAGNOSIS — L820 Inflamed seborrheic keratosis: Secondary | ICD-10-CM

## 2016-08-28 DIAGNOSIS — R351 Nocturia: Secondary | ICD-10-CM

## 2016-08-28 DIAGNOSIS — J302 Other seasonal allergic rhinitis: Secondary | ICD-10-CM

## 2016-08-28 DIAGNOSIS — E032 Hypothyroidism due to medicaments and other exogenous substances: Secondary | ICD-10-CM

## 2016-08-28 LAB — CBC WITH DIFFERENTIAL/PLATELET
BASOS ABS: 0 10*3/uL (ref 0.0–0.1)
Basophils Relative: 0.5 % (ref 0.0–3.0)
EOS ABS: 0.1 10*3/uL (ref 0.0–0.7)
Eosinophils Relative: 1.5 % (ref 0.0–5.0)
HEMATOCRIT: 44.9 % (ref 39.0–52.0)
HEMOGLOBIN: 15.1 g/dL (ref 13.0–17.0)
LYMPHS PCT: 17.4 % (ref 12.0–46.0)
Lymphs Abs: 1.1 10*3/uL (ref 0.7–4.0)
MCHC: 33.6 g/dL (ref 30.0–36.0)
MCV: 92.8 fl (ref 78.0–100.0)
Monocytes Absolute: 0.4 10*3/uL (ref 0.1–1.0)
Monocytes Relative: 7.1 % (ref 3.0–12.0)
Neutro Abs: 4.7 10*3/uL (ref 1.4–7.7)
Neutrophils Relative %: 73.5 % (ref 43.0–77.0)
Platelets: 274 10*3/uL (ref 150.0–400.0)
RBC: 4.84 Mil/uL (ref 4.22–5.81)
RDW: 13.1 % (ref 11.5–15.5)
WBC: 6.4 10*3/uL (ref 4.0–10.5)

## 2016-08-28 LAB — POCT URINALYSIS DIPSTICK
Blood, UA: NEGATIVE
GLUCOSE UA: NEGATIVE
Ketones, UA: NEGATIVE
LEUKOCYTES UA: NEGATIVE
NITRITE UA: NEGATIVE
Spec Grav, UA: 1.02
UROBILINOGEN UA: 1
pH, UA: 7

## 2016-08-28 LAB — BASIC METABOLIC PANEL
BUN: 20 mg/dL (ref 6–23)
CHLORIDE: 103 meq/L (ref 96–112)
CO2: 30 meq/L (ref 19–32)
Calcium: 9.6 mg/dL (ref 8.4–10.5)
Creatinine, Ser: 1.02 mg/dL (ref 0.40–1.50)
GFR: 76.33 mL/min (ref >60.00)
Glucose, Bld: 96 mg/dL (ref 70–99)
Potassium: 4.8 mEq/L (ref 3.5–5.1)
SODIUM: 138 meq/L (ref 135–145)

## 2016-08-28 LAB — HEPATIC FUNCTION PANEL
ALT: 22 U/L (ref 0–53)
AST: 15 U/L (ref 0–37)
Albumin: 4.2 g/dL (ref 3.5–5.2)
Alkaline Phosphatase: 70 U/L (ref 39–117)
Bilirubin, Direct: 0.1 mg/dL (ref 0.0–0.3)
TOTAL PROTEIN: 6.8 g/dL (ref 6.0–8.3)
Total Bilirubin: 0.6 mg/dL (ref 0.2–1.2)

## 2016-08-28 LAB — LIPID PANEL
CHOL/HDL RATIO: 5
Cholesterol: 222 mg/dL — ABNORMAL HIGH (ref 0–200)
HDL: 44.4 mg/dL (ref >39.00)
LDL CALC: 157 mg/dL — AB (ref 0–99)
NonHDL: 177.28
Triglycerides: 103 mg/dL (ref 0.0–149.0)
VLDL: 20.6 mg/dL (ref 0.0–40.0)

## 2016-08-28 LAB — PSA: PSA: 2.22 ng/mL (ref 0.10–4.00)

## 2016-08-28 LAB — TSH: TSH: 4.63 u[IU]/mL — AB (ref 0.35–4.50)

## 2016-08-28 MED ORDER — TAMSULOSIN HCL 0.4 MG PO CAPS: 0.4000 mg | ORAL_CAPSULE | Freq: Every day | ORAL | 4 refills | Status: DC

## 2016-08-28 MED ORDER — SYNTHROID 88 MCG PO TABS: 88.0000 ug | ORAL_TABLET | Freq: Every day | ORAL | 4 refills | Status: DC

## 2016-08-28 MED ORDER — ALBUTEROL SULFATE HFA 108 (90 BASE) MCG/ACT IN AERS: 2.0000 | INHALATION_SPRAY | Freq: Four times a day (QID) | RESPIRATORY_TRACT | 1 refills | Status: DC | PRN

## 2016-08-28 NOTE — Patient Instructions (Signed)
Continue current medications  Sugar free diet  Walk 30 minutes daily  If you decide you would like that hip evaluated I would recommend Dr. Adriana Mccallum or Dr. Pilar Plate a,,,,,,,, at [place] 400 mg twice daily with food

## 2016-08-28 NOTE — Progress Notes (Signed)
Ryan [name] is a delightful 71 year old married male nonsmoker who comes in today for evaluation of a few issues  He has a history of hypothyroidism and takes Synthroid 88 g daily. Labs today to check TSH  He takes an aspirin daily for cardiac protection  He has a history of allergic rhinitis and takes over-the-counter antihistamine when necessary and albuterol when necessary when he has contact with something make some wheeze. Using it's allergy. In 2017 he used albuterol for 2 or 3 days and in the entire 12 month period of time. Therefore I don't take any further evaluation is necessary  He gets routine eye care, dental care, colonoscopy 2014 showed some polyps. He's due to go back in 2019.  He's had pain in his right anterior groin for the last 6 months. He states winging a golf club doesn't bother him but walking does. He's been to see neurosurgery in the past and was told he had spinal stenosis and bulging disks. With time and physical therapy that has seemed to gotten better.  Vaccinations he is doing Pneumovax 23 and a tetanus booster.  He's got BPH and nocturia 2. His urinary stream is getting weaker. We discussed beginning Flomax.  Cognitive function normal he walks daily home health safety reviewed no issues identified, no guns in the house, he does have a healthcare power of attorney and living well  Social history he is married lives here in [place].  He's had a history of skin cancer.  Weight is 226 pounds. We advised to continue daily walking but: Sugar free.  14 point review of systems she's you otherwise negative  EKG was done because of his age. EKG was unchanged from previous cardiograms  BP 128/90 (BP Location: Left Arm, Patient Position: Sitting, Cuff Size: Normal)   Pulse 60   Temp 97.8 F (36.6 C) (Oral)   Ht 6' (1.829 m)   Wt 226 lb 3.2 oz (102.6 kg)   BMI 30.68 kg/m  Examination of the HEENT were negative neck was supple no  adenopathy thyroid normal no carotid bruits cardiopulmonary exam normal abdominal exam normal genitalia normal circumcised male. Rectal normal stool guaiac-negative prostate smooth nonnodular 1-2+ symmetrical BPH  Extremities normal skin normal peripheral pulses normal  Orthopedic examination shows 75 of external rotation left hip only 15 on the right and also external rotation elicits severe pain.  #1 hypothyroidism........... continue Synthroid  #2 degenerative joint disease right hip....... discussed options....... patient elects conservative therapy at this time which is exercise and Motrin 400 mg twice a day. Declines orthopedic consult at this juncture  #3 allergic rhinitis........ continue over-the-counter medicine  #4 occasional allergy asthma..... Albuterol when necessary  #5 history of skin cancer....... normal skin exam today  #6 BPH.......Marland Kitchen begin Flomax.

## 2016-08-28 NOTE — Progress Notes (Signed)
Pre visit review using our clinic review tool, if applicable. No additional management support is needed unless otherwise documented below in the visit note. 

## 2016-12-08 ENCOUNTER — Inpatient Hospital Stay (HOSPITAL_COMMUNITY)
Admission: EM | Admit: 2016-12-08 | Discharge: 2016-12-10 | DRG: 247 | Disposition: A | Discharge status: Home / Self Care | Payer: Medicare Other | Attending: Cardiovascular Disease | Admitting: Cardiovascular Disease

## 2016-12-08 ENCOUNTER — Emergency Department (HOSPITAL_COMMUNITY): Payer: Medicare Other

## 2016-12-08 ENCOUNTER — Inpatient Hospital Stay (HOSPITAL_COMMUNITY)
Admission: EM | Disposition: A | Discharge status: Home / Self Care | Payer: Self-pay | Source: Home / Self Care | Attending: Cardiovascular Disease

## 2016-12-08 ENCOUNTER — Encounter (HOSPITAL_COMMUNITY): Payer: Self-pay | Admitting: Nurse Practitioner

## 2016-12-08 DIAGNOSIS — E7800 Pure hypercholesterolemia, unspecified: Secondary | ICD-10-CM

## 2016-12-08 DIAGNOSIS — J45909 Unspecified asthma, uncomplicated: Secondary | ICD-10-CM | POA: Diagnosis present

## 2016-12-08 DIAGNOSIS — Z955 Presence of coronary angioplasty implant and graft: Secondary | ICD-10-CM

## 2016-12-08 DIAGNOSIS — R072 Precordial pain: Secondary | ICD-10-CM | POA: Diagnosis not present

## 2016-12-08 DIAGNOSIS — I2510 Atherosclerotic heart disease of native coronary artery without angina pectoris: Secondary | ICD-10-CM

## 2016-12-08 DIAGNOSIS — Z91041 Radiographic dye allergy status: Secondary | ICD-10-CM

## 2016-12-08 DIAGNOSIS — Z9089 Acquired absence of other organs: Secondary | ICD-10-CM

## 2016-12-08 DIAGNOSIS — Z8249 Family history of ischemic heart disease and other diseases of the circulatory system: Secondary | ICD-10-CM | POA: Diagnosis not present

## 2016-12-08 DIAGNOSIS — F419 Anxiety disorder, unspecified: Secondary | ICD-10-CM | POA: Diagnosis present

## 2016-12-08 DIAGNOSIS — I2121 ST elevation (STEMI) myocardial infarction involving left circumflex coronary artery: Secondary | ICD-10-CM | POA: Diagnosis not present

## 2016-12-08 DIAGNOSIS — R001 Bradycardia, unspecified: Secondary | ICD-10-CM | POA: Diagnosis present

## 2016-12-08 DIAGNOSIS — Z801 Family history of malignant neoplasm of trachea, bronchus and lung: Secondary | ICD-10-CM

## 2016-12-08 DIAGNOSIS — Z881 Allergy status to other antibiotic agents status: Secondary | ICD-10-CM | POA: Diagnosis not present

## 2016-12-08 DIAGNOSIS — Z9841 Cataract extraction status, right eye: Secondary | ICD-10-CM

## 2016-12-08 DIAGNOSIS — Z9842 Cataract extraction status, left eye: Secondary | ICD-10-CM

## 2016-12-08 DIAGNOSIS — E039 Hypothyroidism, unspecified: Secondary | ICD-10-CM | POA: Diagnosis present

## 2016-12-08 DIAGNOSIS — Z880 Allergy status to penicillin: Secondary | ICD-10-CM

## 2016-12-08 DIAGNOSIS — M1990 Unspecified osteoarthritis, unspecified site: Secondary | ICD-10-CM | POA: Diagnosis present

## 2016-12-08 DIAGNOSIS — Z7982 Long term (current) use of aspirin: Secondary | ICD-10-CM | POA: Diagnosis not present

## 2016-12-08 DIAGNOSIS — Z833 Family history of diabetes mellitus: Secondary | ICD-10-CM | POA: Diagnosis not present

## 2016-12-08 DIAGNOSIS — Z9889 Other specified postprocedural states: Secondary | ICD-10-CM

## 2016-12-08 DIAGNOSIS — Z85828 Personal history of other malignant neoplasm of skin: Secondary | ICD-10-CM | POA: Diagnosis not present

## 2016-12-08 DIAGNOSIS — I213 ST elevation (STEMI) myocardial infarction of unspecified site: Secondary | ICD-10-CM

## 2016-12-08 DIAGNOSIS — Z79899 Other long term (current) drug therapy: Secondary | ICD-10-CM | POA: Diagnosis not present

## 2016-12-08 DIAGNOSIS — Z79890 Hormone replacement therapy (postmenopausal): Secondary | ICD-10-CM | POA: Diagnosis not present

## 2016-12-08 DIAGNOSIS — I2119 ST elevation (STEMI) myocardial infarction involving other coronary artery of inferior wall: Secondary | ICD-10-CM | POA: Diagnosis not present

## 2016-12-08 DIAGNOSIS — Z79891 Long term (current) use of opiate analgesic: Secondary | ICD-10-CM

## 2016-12-08 DIAGNOSIS — N400 Enlarged prostate without lower urinary tract symptoms: Secondary | ICD-10-CM | POA: Diagnosis present

## 2016-12-08 DIAGNOSIS — R0789 Other chest pain: Secondary | ICD-10-CM | POA: Diagnosis not present

## 2016-12-08 DIAGNOSIS — R079 Chest pain, unspecified: Secondary | ICD-10-CM | POA: Diagnosis present

## 2016-12-08 HISTORY — DX: Atherosclerotic heart disease of native coronary artery without angina pectoris: I25.10

## 2016-12-08 HISTORY — DX: ST elevation (STEMI) myocardial infarction of unspecified site: I21.3

## 2016-12-08 HISTORY — PX: LEFT HEART CATH AND CORONARY ANGIOGRAPHY: CATH118249

## 2016-12-08 LAB — CBC
HCT: 43.4 % (ref 39.0–52.0)
HEMATOCRIT: 41.7 % (ref 39.0–52.0)
HEMOGLOBIN: 14.8 g/dL (ref 13.0–17.0)
Hemoglobin: 14.2 g/dL (ref 13.0–17.0)
MCH: 31.5 pg (ref 26.0–34.0)
MCH: 31.3 pg (ref 26.0–34.0)
MCHC: 34.1 g/dL (ref 30.0–36.0)
MCHC: 34.1 g/dL (ref 30.0–36.0)
MCV: 92.3 fL (ref 78.0–100.0)
MCV: 91.9 fL (ref 78.0–100.0)
PLATELETS: 295 10*3/uL (ref 150–400)
PLATELETS: 274 10*3/uL (ref 150–400)
RBC: 4.7 MIL/uL (ref 4.22–5.81)
RBC: 4.54 MIL/uL (ref 4.22–5.81)
RDW: 12.7 % (ref 11.5–15.5)
RDW: 12.8 % (ref 11.5–15.5)
WBC: 9.3 10*3/uL (ref 4.0–10.5)
WBC: 11.4 10*3/uL — AB (ref 4.0–10.5)

## 2016-12-08 LAB — BASIC METABOLIC PANEL
ANION GAP: 6 (ref 5–15)
BUN: 24 mg/dL — ABNORMAL HIGH (ref 6–20)
CHLORIDE: 104 mmol/L (ref 101–111)
CO2: 28 mmol/L (ref 22–32)
CREATININE: 1.12 mg/dL (ref 0.61–1.24)
Calcium: 9.2 mg/dL (ref 8.9–10.3)
GFR calc non Af Amer: >60 mL/min (ref >60)
Glucose, Bld: 112 mg/dL — ABNORMAL HIGH (ref 65–99)
Potassium: 3.9 mmol/L (ref 3.5–5.1)
SODIUM: 138 mmol/L (ref 135–145)

## 2016-12-08 LAB — APTT: aPTT: 26 seconds (ref 24–36)

## 2016-12-08 LAB — I-STAT TROPONIN, ED: TROPONIN I, POC: 1.15 ng/mL — AB (ref 0.00–0.08)

## 2016-12-08 LAB — PROTIME-INR
INR: 0.92
PROTHROMBIN TIME: 12.4 s (ref 11.4–15.2)

## 2016-12-08 LAB — TROPONIN I: Troponin I: 1.01 ng/mL (ref <0.03)

## 2016-12-08 SURGERY — LEFT HEART CATH AND CORONARY ANGIOGRAPHY
Anesthesia: LOCAL

## 2016-12-08 MED ORDER — LEVOTHYROXINE SODIUM 88 MCG PO TABS
88.0000 ug | ORAL_TABLET | Freq: Every day | ORAL | Status: DC
  Administered 2016-12-09 – 2016-12-10 (×2): 88 ug via ORAL
  Filled 2016-12-08 (×2): qty 1

## 2016-12-08 MED ORDER — MIDAZOLAM HCL 2 MG/2ML IJ SOLN
INTRAMUSCULAR | Status: AC
  Filled 2016-12-08: qty 2

## 2016-12-08 MED ORDER — LIDOCAINE HCL (PF) 1 % IJ SOLN
INTRAMUSCULAR | Status: AC
  Filled 2016-12-08: qty 30

## 2016-12-08 MED ORDER — TIROFIBAN HCL IN NACL 5-0.9 MG/100ML-% IV SOLN
INTRAVENOUS | Status: AC
  Filled 2016-12-08: qty 100

## 2016-12-08 MED ORDER — MIDAZOLAM HCL 2 MG/2ML IJ SOLN
INTRAMUSCULAR | Status: DC | PRN
  Administered 2016-12-08: 2 mg via INTRAVENOUS

## 2016-12-08 MED ORDER — SODIUM CHLORIDE 0.9 % IV SOLN
INTRAVENOUS | Status: DC
  Administered 2016-12-08: 21:00:00 via INTRAVENOUS

## 2016-12-08 MED ORDER — METHYLPREDNISOLONE SODIUM SUCC 125 MG IJ SOLR
INTRAMUSCULAR | Status: DC | PRN
  Administered 2016-12-08: 125 mg via INTRAVENOUS

## 2016-12-08 MED ORDER — FENTANYL CITRATE (PF) 100 MCG/2ML IJ SOLN
INTRAMUSCULAR | Status: DC | PRN
  Administered 2016-12-08: 25 ug via INTRAVENOUS

## 2016-12-08 MED ORDER — ASPIRIN EC 81 MG PO TBEC
81.0000 mg | DELAYED_RELEASE_TABLET | Freq: Every day | ORAL | Status: DC
  Administered 2016-12-09 – 2016-12-10 (×2): 81 mg via ORAL
  Filled 2016-12-08 (×2): qty 1

## 2016-12-08 MED ORDER — NITROGLYCERIN 1 MG/10 ML FOR IR/CATH LAB
INTRA_ARTERIAL | Status: AC
  Filled 2016-12-08: qty 10

## 2016-12-08 MED ORDER — SODIUM CHLORIDE 0.9% FLUSH
3.0000 mL | Freq: Two times a day (BID) | INTRAVENOUS | Status: DC
  Administered 2016-12-09 (×2): 3 mL via INTRAVENOUS

## 2016-12-08 MED ORDER — LABETALOL HCL 5 MG/ML IV SOLN: 10.0000 mg | INTRAVENOUS | Status: AC | PRN

## 2016-12-08 MED ORDER — DIPHENHYDRAMINE HCL 50 MG/ML IJ SOLN
INTRAMUSCULAR | Status: AC
  Filled 2016-12-08: qty 1

## 2016-12-08 MED ORDER — ATORVASTATIN CALCIUM 80 MG PO TABS
80.0000 mg | ORAL_TABLET | Freq: Every day | ORAL | Status: DC
Start: 2016-12-08 — End: 2016-12-10
  Administered 2016-12-08 – 2016-12-09 (×2): 80 mg via ORAL
  Filled 2016-12-08 (×2): qty 1

## 2016-12-08 MED ORDER — ONDANSETRON HCL 4 MG/2ML IJ SOLN: 4.0000 mg | Freq: Four times a day (QID) | INTRAMUSCULAR | Status: DC | PRN

## 2016-12-08 MED ORDER — HEPARIN (PORCINE) IN NACL 2-0.9 UNIT/ML-% IJ SOLN
INTRAMUSCULAR | Status: DC | PRN
  Administered 2016-12-08: 1000 mL

## 2016-12-08 MED ORDER — IOPAMIDOL (ISOVUE-370) INJECTION 76%
INTRAVENOUS | Status: DC | PRN
  Administered 2016-12-08: 135 mL via INTRA_ARTERIAL

## 2016-12-08 MED ORDER — METHYLPREDNISOLONE SODIUM SUCC 125 MG IJ SOLR
INTRAMUSCULAR | Status: AC
  Filled 2016-12-08: qty 2

## 2016-12-08 MED ORDER — FENTANYL CITRATE (PF) 100 MCG/2ML IJ SOLN
INTRAMUSCULAR | Status: AC
  Filled 2016-12-08: qty 2

## 2016-12-08 MED ORDER — TAMSULOSIN HCL 0.4 MG PO CAPS
0.4000 mg | ORAL_CAPSULE | Freq: Every day | ORAL | Status: DC
  Administered 2016-12-09 – 2016-12-10 (×2): 0.4 mg via ORAL
  Filled 2016-12-08 (×2): qty 1

## 2016-12-08 MED ORDER — TICAGRELOR 90 MG PO TABS
ORAL_TABLET | ORAL | Status: AC
  Filled 2016-12-08: qty 2

## 2016-12-08 MED ORDER — HEPARIN SODIUM (PORCINE) 1000 UNIT/ML IJ SOLN
INTRAMUSCULAR | Status: DC | PRN
  Administered 2016-12-08: 6000 [IU] via INTRAVENOUS
  Administered 2016-12-08: 2000 [IU] via INTRAVENOUS

## 2016-12-08 MED ORDER — ENOXAPARIN SODIUM 40 MG/0.4ML ~~LOC~~ SOLN
40.0000 mg | SUBCUTANEOUS | Status: DC
  Administered 2016-12-09: 40 mg via SUBCUTANEOUS
  Filled 2016-12-08 (×2): qty 0.4

## 2016-12-08 MED ORDER — TICAGRELOR 90 MG PO TABS
ORAL_TABLET | ORAL | Status: DC | PRN
  Administered 2016-12-08: 180 mg via ORAL

## 2016-12-08 MED ORDER — ACETAMINOPHEN 325 MG PO TABS
650.0000 mg | ORAL_TABLET | ORAL | Status: DC | PRN
  Administered 2016-12-09: 650 mg via ORAL
  Filled 2016-12-08: qty 2

## 2016-12-08 MED ORDER — NITROGLYCERIN 0.4 MG SL SUBL
SUBLINGUAL_TABLET | SUBLINGUAL | Status: AC
  Administered 2016-12-08: 0.4 mg via SUBLINGUAL
  Filled 2016-12-08: qty 1

## 2016-12-08 MED ORDER — TIROFIBAN HCL IN NACL 5-0.9 MG/100ML-% IV SOLN
0.1500 ug/kg/min | INTRAVENOUS | Status: AC
  Administered 2016-12-08: 0.15 ug/kg/min via INTRAVENOUS

## 2016-12-08 MED ORDER — METOPROLOL TARTRATE 12.5 MG HALF TABLET
12.5000 mg | ORAL_TABLET | Freq: Two times a day (BID) | ORAL | Status: DC
  Administered 2016-12-08: 12.5 mg via ORAL
  Filled 2016-12-08: qty 1

## 2016-12-08 MED ORDER — SODIUM CHLORIDE 0.9 % IV SOLN
INTRAVENOUS | Status: DC | PRN
  Administered 2016-12-08: 10 mL/h via INTRAVENOUS

## 2016-12-08 MED ORDER — NITROGLYCERIN 1 MG/10 ML FOR IR/CATH LAB
INTRA_ARTERIAL | Status: DC | PRN
  Administered 2016-12-08: 150 ug via INTRACORONARY

## 2016-12-08 MED ORDER — HEPARIN (PORCINE) IN NACL 2-0.9 UNIT/ML-% IJ SOLN
INTRAMUSCULAR | Status: DC | PRN
  Administered 2016-12-08: 21:00:00 via INTRA_ARTERIAL

## 2016-12-08 MED ORDER — HEPARIN (PORCINE) IN NACL 2-0.9 UNIT/ML-% IJ SOLN
INTRAMUSCULAR | Status: AC
  Filled 2016-12-08: qty 1000

## 2016-12-08 MED ORDER — HYDRALAZINE HCL 20 MG/ML IJ SOLN: 5.0000 mg | INTRAMUSCULAR | Status: AC | PRN

## 2016-12-08 MED ORDER — TIROFIBAN (AGGRASTAT) BOLUS VIA INFUSION
INTRAVENOUS | Status: DC | PRN
  Administered 2016-12-08: 2495 ug via INTRAVENOUS

## 2016-12-08 MED ORDER — SODIUM CHLORIDE 0.9% FLUSH: 3.0000 mL | INTRAVENOUS | Status: DC | PRN

## 2016-12-08 MED ORDER — SODIUM CHLORIDE 0.9 % WEIGHT BASED INFUSION
1.0000 mL/kg/h | INTRAVENOUS | Status: AC
  Administered 2016-12-08: 1 mL/kg/h via INTRAVENOUS

## 2016-12-08 MED ORDER — NITROGLYCERIN 0.4 MG SL SUBL
0.4000 mg | SUBLINGUAL_TABLET | SUBLINGUAL | Status: DC | PRN
  Administered 2016-12-08 (×4): 0.4 mg via SUBLINGUAL
  Filled 2016-12-08: qty 1

## 2016-12-08 MED ORDER — HEPARIN SODIUM (PORCINE) 5000 UNIT/ML IJ SOLN
4000.0000 [IU] | INTRAMUSCULAR | Status: AC
  Administered 2016-12-08: 4000 [IU] via INTRAVENOUS
  Filled 2016-12-08: qty 0.8

## 2016-12-08 MED ORDER — IOPAMIDOL (ISOVUE-370) INJECTION 76%
INTRAVENOUS | Status: AC
  Filled 2016-12-08: qty 50

## 2016-12-08 MED ORDER — DIPHENHYDRAMINE HCL 50 MG/ML IJ SOLN
INTRAMUSCULAR | Status: DC | PRN
  Administered 2016-12-08: 25 mg via INTRAVENOUS

## 2016-12-08 MED ORDER — TICAGRELOR 90 MG PO TABS
90.0000 mg | ORAL_TABLET | Freq: Two times a day (BID) | ORAL | Status: DC
  Administered 2016-12-09 – 2016-12-10 (×3): 90 mg via ORAL
  Filled 2016-12-08 (×3): qty 1

## 2016-12-08 MED ORDER — TIROFIBAN HCL IN NACL 5-0.9 MG/100ML-% IV SOLN: INTRAVENOUS | Status: DC | PRN

## 2016-12-08 MED ORDER — SODIUM CHLORIDE 0.9 % IV SOLN: 250.0000 mL | INTRAVENOUS | Status: DC | PRN

## 2016-12-08 SURGICAL SUPPLY — 16 items
BALLN MOZEC 2.50X14 (BALLOONS) ×2
BALLOON MOZEC 2.50X14 (BALLOONS) ×1 IMPLANT
CATH 5FR JL3.5 JR4 ANG PIG MP (CATHETERS) ×2 IMPLANT
CATH LAUNCHER 6FR EBU3.5 (CATHETERS) ×2 IMPLANT
DEVICE RAD COMP TR BAND LRG (VASCULAR PRODUCTS) ×2 IMPLANT
GLIDESHEATH SLEND SS 6F .021 (SHEATH) ×2 IMPLANT
GUIDEWIRE INQWIRE 1.5J.035X260 (WIRE) ×1 IMPLANT
INQWIRE 1.5J .035X260CM (WIRE) ×2
KIT ENCORE 26 ADVANTAGE (KITS) ×2 IMPLANT
KIT HEART LEFT (KITS) ×2 IMPLANT
PACK CARDIAC CATHETERIZATION (CUSTOM PROCEDURE TRAY) ×2 IMPLANT
STENT PROMUS PREM MR 3.5X24 (Permanent Stent) ×2 IMPLANT
SYR MEDRAD MARK V 150ML (SYRINGE) ×2 IMPLANT
TRANSDUCER W/STOPCOCK (MISCELLANEOUS) ×2 IMPLANT
TUBING CIL FLEX 10 FLL-RA (TUBING) ×2 IMPLANT
WIRE COUGAR XT STRL 190CM (WIRE) ×2 IMPLANT

## 2016-12-08 NOTE — H&P (Signed)
History and Physical  Patient ID: Ryan [name] MRN: [id number], SOB: 1946/04/05 71 y.o. Date of Encounter: 12/08/2016, 9:00 PM  Primary Physician: Dorena Cookey, MD Primary Cardiologist: new  Chief Complaint: Indigestion/chest pain  HPI: 71 y.o. male who presented to [facility] on 12/08/2016 transferred from [facility] as a Code STEMI. The patient has no past cardiac problems.  The patient experienced a feeling of indigestion and heartburn about 72 hours ago. This lasted for a few hours. He had a more severe episode this evening prompted him to go to the emergency room by private vehicle. On arrival here he still complains of 4/10 chest discomfort. This feels like a sensation of heartburn radiating to the neck. There is no associated shortness of breath, diaphoresis, nausea, or vomiting. He has no past history of similar symptoms.  Past Medical History:  Diagnosis Date  . Allergy   . Anxiety   . Arthritis   . Asthma   . Cataract   . Hypothyroidism      Surgical History:  Past Surgical History:  Procedure Laterality Date  . cataract Bilateral 03/2016  . COLONOSCOPY  10.27.2005  . SHOULDER SURGERY Right   . TONSILLECTOMY       Home Meds: Prior to Admission medications   Medication Sig Start Date End Date Taking? Authorizing Provider  albuterol (PROVENTIL HFA;VENTOLIN HFA) 108 (90 Base) MCG/ACT inhaler Inhale 2 puffs into the lungs every 6 (six) hours as needed for wheezing. 08/28/16   Dorena Cookey, MD  aspirin 325 MG tablet Take 325 mg by mouth daily.      [provider]  FISH OIL-KRILL OIL PO Take 1 tablet by mouth daily.    [provider]  HYDROcodone-homatropine (HYCODAN) 5-1.5 MG/5ML syrup Take 5 mLs by mouth every 8 (eight) hours as needed. 06/05/15   Dorena Cookey, MD  Multiple Vitamin (MULTIVITAMIN) tablet Take 1 tablet by mouth daily.    [provider]  SYNTHROID 88 MCG tablet Take 1 tablet (88 mcg total) by mouth  daily. 08/28/16   Dorena Cookey, MD  tamsulosin (FLOMAX) 0.4 MG CAPS capsule Take 1 capsule (0.4 mg total) by mouth daily. 08/28/16   Dorena Cookey, MD    Allergies:  Allergies  Allergen Reactions  . Erythromycin     REACTION: Nausea Vomiting  . Penicillins     REACTION: Hives    Social History   Social History  . Marital status: Married    Spouse name: N/A  . Number of children: 2  . Years of education: N/A   Occupational History  . retired    Social History Main Topics  . Smoking status: Never Smoker  . Smokeless tobacco: Never Used  . Alcohol use Yes     Comment: less than 1 per day  . Drug use: No  . Sexual activity: Not on file   Other Topics Concern  . Not on file   Social History Narrative  . No narrative on file     Family History  Problem Relation Age of Onset  . Diabetes Mother   . Heart failure Mother   . Irritable bowel syndrome Mother   . Diverticulosis Mother   . Lung cancer Father   . Heart disease Sister   . Irritable bowel syndrome Sister   . Diverticulosis Sister     Review of Systems: General: negative for chills, fever, night sweats or weight changes.  ENT: negative for rhinorrhea  or epistaxis Cardiovascular: see HPI Dermatological: negative for rash Respiratory: negative for cough. Positive for wheezing GI: negative for nausea, vomiting, diarrhea, bright red blood per rectum, melena, or hematemesis GU: no hematuria, urgency, or frequency Neurologic: negative for visual changes, syncope, headache, or dizziness Heme: no easy bruising or bleeding Endo: negative for excessive thirst, thyroid disorder, or flushing Musculoskeletal: negative for joint pain or swelling, negative for myalgias All other systems reviewed and are otherwise negative except as noted above.  Physical Exam: Blood pressure (!) 156/74, pulse 73, temperature 98.7 F (37.1 C), temperature source Oral, resp. rate (!) 22, height 6' (1.829 m), weight 220 lb (99.8  kg), SpO2 93 %. General: Well developed, well nourished, alert and oriented, in no acute distress. HEENT: Normocephalic, atraumatic, sclera anicteric Neck: Supple. Carotids 2+ without bruits. JVP normal Lungs: Clear bilaterally to auscultation without wheezes, rales, or rhonchi. Breathing is unlabored. Heart: RRR with normal S1 and S2. No murmurs, rubs, or gallops appreciated. Abdomen: Soft, non-tender, non-distended with normoactive bowel sounds. No hepatomegaly. No rebound/guarding. No obvious abdominal masses. Back: No CVA tenderness Msk:  Strength and tone appear normal for age. Extremities: No clubbing, cyanosis, or edema.  Distal pedal pulses are 2+ and equal bilaterally. Neuro: CNII-XII intact, moves all extremities spontaneously. Psych:  Responds to questions appropriately with a normal affect. Skin: warm and dry without rash   Labs:   Lab Results  Component Value Date   WBC 9.3 12/08/2016   HGB 14.8 12/08/2016   HCT 43.4 12/08/2016   MCV 92.3 12/08/2016   PLT 295 12/08/2016    Recent Labs Lab 12/08/16 1956  NA 138  K 3.9  CL 104  CO2 28  BUN 24*  CREATININE 1.12  CALCIUM 9.2  GLUCOSE 112*   No results for input(s): CKTOTAL, CKMB, TROPONINI in the last 72 hours. Lab Results  Component Value Date   CHOL 222 (H) 08/28/2016   HDL 44.40 08/28/2016   LDLCALC 157 (H) 08/28/2016   TRIG 103.0 08/28/2016   No results found for: DDIMER  Radiology/Studies:  Dg Chest 2 View  Result Date: 12/08/2016 CLINICAL DATA:  Chest pain EXAM: CHEST  2 VIEW COMPARISON:  11/03/2009 FINDINGS: Normal heart size and stable mediastinal contours. Mild scar-like opacity at the left base, similar to prior. There is no edema, consolidation, effusion, or pneumothorax. IMPRESSION: No evidence of active disease. Electronically Signed   By: Monte Fantasia M.D.   On: 12/08/2016 20:22     EKG: NSR with inferoposterior injury, acute  CARDIAC STUDIES: pending  ASSESSMENT AND PLAN:  1. Acute  inferoposterior STEMI: borderline ST segments but typical history, elevated troponin, and injury pattern consistent with this diagnosis. Patient received heparin 4000 units intravenously and aspirin 324 mg. Plan emergency cardiac catheterization and primary PCI. Emergency implied consent is obtained. I reviewed the risks, indications, and alternatives with the patient at length.  2. Contrast allergy: remote reaction but required epi injection with IVP dye. Will premedicate with solumedrol 125 mg IV and benadryl 25 mg IV.    Deatra Gurinder MD 12/08/2016, 9:00 PM

## 2016-12-08 NOTE — ED Notes (Signed)
EKG given to Dr. Long 

## 2016-12-08 NOTE — ED Notes (Signed)
[facility] here for transport to [facility]

## 2016-12-08 NOTE — ED Triage Notes (Signed)
Pt is c/o 6/10 chest pain with a sudden onset about an hour ago. He is pin pointing it to the mid chest and states he was eating his dinner salad with blue cheese dressing when the pain started. Adds that he noticed that his BP was in the 366'Y systolic when he took at home and he does not have a known hx of HTN.

## 2016-12-08 NOTE — Progress Notes (Signed)
CRITICAL VALUE ALERT  Critical value received:  Troponin 1.01  Date of notification:  12/08/16  Time of notification:  2135  Critical value read back: yes  Nurse who received alert:  Cedric Fishman RN, [facility]  MD notified (1st page):  Lab tried calling RN scrubbed in cath lab  Time of first page:   MD notified (2nd page):  Time of second page:  Responding MD:    Time MD responded:

## 2016-12-08 NOTE — ED Provider Notes (Signed)
Emergency Department Provider Note   I have reviewed the triage vital signs and the nursing notes.   HISTORY  Chief Complaint Chest Pain   HPI Ryan [name] is a 71 y.o. male presents to the ED by private vehicle for "indigestion" pain for the last 1.5 hours. 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 Patient denies any history of similar pain or ACS. Does have a family history of ACS but no other risk factors. Pain is moderate, nagging, persistent, and non-exertional. Also notes some belching with pain. No dyspnea or diaphoresis.   Past Medical History:  Diagnosis Date  . Allergy   . Anxiety   . Arthritis   . Asthma   . Cataract   . Hypothyroidism     Patient Active Problem List   Diagnosis Date Noted  . Acute inferoposterior myocardial infarction ([place]) 12/08/2016  . STEMI involving left circumflex coronary artery ([place]) 12/08/2016  . History of skin cancer 08/28/2016  . BPH associated with nocturia 05/17/2014  . Hypothyroidism 03/11/2007    Past Surgical History:  Procedure Laterality Date  . cataract Bilateral 03/2016  . COLONOSCOPY  10.27.2005  . LEFT HEART CATH AND CORONARY ANGIOGRAPHY N/A 12/08/2016   Procedure: Left Heart Cath and Coronary Angiography;  Surgeon: Sherren Mocha, MD;  Location: [facility];  Service: Cardiovascular;  Laterality: N/A;  . SHOULDER SURGERY Right   . TONSILLECTOMY        Allergies Erythromycin and Penicillins  Family History  Problem Relation Age of Onset  . Diabetes Mother   . Heart failure Mother   . Irritable bowel syndrome Mother   . Diverticulosis Mother   . Lung cancer Father   . Heart disease Sister   . Irritable bowel syndrome Sister   . Diverticulosis Sister     Social History Social History  Substance Use Topics  . Smoking status: Never Smoker  . Smokeless tobacco: Never Used  . Alcohol use Yes     Comment: less than 1 per day     Review of Systems  Constitutional: No fever/chills Eyes: No visual changes. ENT: No sore throat. Cardiovascular: Positive chest pain. Respiratory: Denies shortness of breath. Gastrointestinal: No abdominal pain.  No nausea, no vomiting.  No diarrhea.  No constipation. Genitourinary: Negative for dysuria. Musculoskeletal: Negative for back pain. Skin: Negative for rash. Neurological: Negative for headaches, focal weakness or numbness.  10-point ROS otherwise negative.  ____________________________________________   PHYSICAL EXAM:  VITAL SIGNS: ED Triage Vitals  Enc Vitals Group     BP 12/08/16 1952 (!) 163/97     Pulse Rate 12/08/16 1952 (!) 56     Resp 12/08/16 1952 18     Temp 12/08/16 1952 98.7 F (37.1 C)     Temp Source 12/08/16 1952 Oral     SpO2 12/08/16 1952 98 %     Weight 12/08/16 1953 220 lb (99.8 kg)     Height 12/08/16 1953 6' (1.829 m)     Pain Score 12/08/16 1952 6   Constitutional: Alert and oriented. Well appearing and in no acute distress. Eyes: Conjunctivae are normal.  Head: Atraumatic. Nose: No congestion/rhinnorhea. Mouth/Throat: Mucous membranes are moist. Neck: No stridor.  Cardiovascular: Normal rate, regular rhythm. Good peripheral circulation. Grossly normal heart sounds.   Respiratory: Normal respiratory effort.  No retractions. Lungs CTAB. Gastrointestinal: Soft and nontender. No distention.  Musculoskeletal: No lower extremity tenderness nor edema. No gross  deformities of extremities. Neurologic:  Normal speech and language. No gross focal neurologic deficits are appreciated.  Skin:  Skin is warm, dry and intact. No rash noted.  ____________________________________________   LABS (all labs ordered are listed, but only abnormal results are displayed)  Labs Reviewed  BASIC METABOLIC PANEL - Abnormal; Notable for the following:       Result Value   Glucose, Bld 112 (*)    BUN 24 (*)    All other components within normal limits   TROPONIN I - Abnormal; Notable for the following:    Troponin I 1.01 (*)    All other components within normal limits  LIPID PANEL - Abnormal; Notable for the following:    Cholesterol 206 (*)    LDL Cholesterol 144 (*)    All other components within normal limits  CBC - Abnormal; Notable for the following:    WBC 11.4 (*)    All other components within normal limits  TROPONIN I - Abnormal; Notable for the following:    Troponin I 0.97 (*)    All other components within normal limits  TROPONIN I - Abnormal; Notable for the following:    Troponin I 2.32 (*)    All other components within normal limits  BASIC METABOLIC PANEL - Abnormal; Notable for the following:    Glucose, Bld 172 (*)    All other components within normal limits  I-STAT TROPOININ, ED - Abnormal; Notable for the following:    Troponin i, poc 1.15 (*)    All other components within normal limits  MRSA PCR SCREENING  CBC  PROTIME-INR  APTT  CREATININE, SERUM  CBC  TROPONIN I   ____________________________________________  EKG   EKG Interpretation  Date/Time:  Sunday Dec 08 2016 19:47:40 EDT Ventricular Rate:  59 PR Interval:    QRS Duration: 100 QT Interval:  440 QTC Calculation: 436 R Axis:   -31 Text Interpretation:  Sinus rhythm Abnormal R-wave progression, late transition Inferior infarct, old No STEMI. No old for comparison Confirmed by Ying Blankenhorn MD, Edoardo Laforte [phone]) on 12/08/2016 8:18:40 PM       ____________________________________________  RADIOLOGY  Dg Chest 2 View  Result Date: 12/08/2016 CLINICAL DATA:  Chest pain EXAM: CHEST  2 VIEW COMPARISON:  11/03/2009 FINDINGS: Normal heart size and stable mediastinal contours. Mild scar-like opacity at the left base, similar to prior. There is no edema, consolidation, effusion, or pneumothorax. IMPRESSION: No evidence of active disease. Electronically Signed   By: Monte Fantasia M.D.   On: 12/08/2016 20:22     ____________________________________________   PROCEDURES  Procedure(s) performed:   Procedures  CRITICAL CARE Performed by: Margette Fast Total critical care time: 30 minutes Critical care time was exclusive of separately billable procedures and treating other patients. Critical care was necessary to treat or prevent imminent or life-threatening deterioration. Critical care was time spent personally by me on the following activities: development of treatment plan with patient and/or surrogate as well as nursing, discussions with consultants, evaluation of patient's response to treatment, examination of patient, obtaining history from patient or surrogate, ordering and performing treatments and interventions, ordering and review of laboratory studies, ordering and review of radiographic studies, pulse oximetry and re-evaluation of patient's condition.  Nanda Quinton, MD Emergency Medicine  ____________________________________________   INITIAL IMPRESSION / ASSESSMENT AND PLAN / ED COURSE  Pertinent labs & imaging results that were available during my care of the patient were reviewed by me and considered in my medical decision making (  see chart for details).  Patient arrives to the ED with indigestion type pain with elevated troponin. Evaluated immediately with continued pain and repeat EKG. Repeat EKG concerning for slight ST elevation in the inferior leads and possible ST depression in V1 not appreciated on arrival EKG. CODE STEMI was activated and the patient was started on Heparin. He took ASA 325 mg PTA. Will give Nitro for CP and discuss EKG and presentation with STEMI provider on call.   8:34 PM Spoke with Dr. Burt Knack regarding STEMI call out. Will activate STEMI and cath emergently. Advises heparin and immediate transfer to [facility].   08:45 PM Awaiting transport. Patient pain improved after SL nitroglycerine. Some bradycardia noted after Nitro. Normal BP. No pacing indication at  this time. Patient remains awake and alert. Discussed plan for transfer for cath lab in detail.   ____________________________________________  FINAL CLINICAL IMPRESSION(S) / ED DIAGNOSES  Final diagnoses:  ST elevation myocardial infarction (STEMI), unspecified artery (HCC)  Precordial chest pain  Bradycardia     MEDICATIONS GIVEN DURING THIS VISIT:  Medications  0.9 %  sodium chloride infusion ( Intravenous New Bag/Given 12/08/16 2036)  nitroGLYCERIN (NITROSTAT) SL tablet 0.4 mg (0.4 mg Sublingual Given 12/08/16 2313)  levothyroxine (SYNTHROID, LEVOTHROID) tablet 88 mcg (88 mcg Oral Given 12/09/16 1002)  tamsulosin (FLOMAX) capsule 0.4 mg (0.4 mg Oral Given 12/09/16 1003)  aspirin EC tablet 81 mg (81 mg Oral Given 12/09/16 1003)  acetaminophen (TYLENOL) tablet 650 mg (not administered)  ondansetron (ZOFRAN) injection 4 mg (not administered)  enoxaparin (LOVENOX) injection 40 mg (40 mg Subcutaneous Given 12/09/16 1003)  ticagrelor (BRILINTA) tablet 90 mg (90 mg Oral Given 12/09/16 1002)  atorvastatin (LIPITOR) tablet 80 mg (80 mg Oral Given 12/08/16 2316)  labetalol (NORMODYNE,TRANDATE) injection 10 mg (not administered)  hydrALAZINE (APRESOLINE) injection 5 mg (not administered)  0.9% sodium chloride infusion (0 mL/kg/hr  99.8 kg Intravenous Stopped 12/09/16 0849)  sodium chloride flush (NS) 0.9 % injection 3 mL (3 mLs Intravenous Given 12/09/16 0800)  sodium chloride flush (NS) 0.9 % injection 3 mL (not administered)  0.9 %  sodium chloride infusion (not administered)  tirofiban (AGGRASTAT) infusion 50 mcg/mL 100 mL (0 mcg/kg/min  101.7 kg Intravenous Stopped 12/09/16 0530)  heparin injection 4,000 Units (4,000 Units Intravenous Given 12/08/16 2036)     NEW OUTPATIENT MEDICATIONS STARTED DURING THIS VISIT:  None   Note:  This document was prepared using Dragon voice recognition software and may include unintentional dictation errors.  Nanda Quinton, MD Emergency Medicine   Mikena Masoner, [facility],  MD 12/09/16 1020

## 2016-12-09 ENCOUNTER — Encounter (HOSPITAL_COMMUNITY): Payer: Self-pay | Admitting: Cardiovascular Disease

## 2016-12-09 LAB — CBC
HCT: 42.6 % (ref 39.0–52.0)
HEMOGLOBIN: 14.5 g/dL (ref 13.0–17.0)
MCH: 30.9 pg (ref 26.0–34.0)
MCHC: 34 g/dL (ref 30.0–36.0)
MCV: 90.8 fL (ref 78.0–100.0)
PLATELETS: 281 10*3/uL (ref 150–400)
RBC: 4.69 MIL/uL (ref 4.22–5.81)
RDW: 12.6 % (ref 11.5–15.5)
WBC: 10.1 10*3/uL (ref 4.0–10.5)

## 2016-12-09 LAB — CREATININE, SERUM
Creatinine, Ser: 1.04 mg/dL (ref 0.61–1.24)
GFR calc Af Amer: >60 mL/min (ref >60)
GFR calc non Af Amer: >60 mL/min (ref >60)

## 2016-12-09 LAB — BASIC METABOLIC PANEL
ANION GAP: 8 (ref 5–15)
BUN: 20 mg/dL (ref 6–20)
CO2: 22 mmol/L (ref 22–32)
CREATININE: 0.94 mg/dL (ref 0.61–1.24)
Calcium: 8.9 mg/dL (ref 8.9–10.3)
Chloride: 107 mmol/L (ref 101–111)
Glucose, Bld: 172 mg/dL — ABNORMAL HIGH (ref 65–99)
Potassium: 3.7 mmol/L (ref 3.5–5.1)
SODIUM: 137 mmol/L (ref 135–145)

## 2016-12-09 LAB — LIPID PANEL
CHOL/HDL RATIO: 4.7 ratio
Cholesterol: 206 mg/dL — ABNORMAL HIGH (ref 0–200)
HDL: 44 mg/dL (ref >40)
LDL CALC: 144 mg/dL — AB (ref 0–99)
Triglycerides: 92 mg/dL (ref <150)
VLDL: 18 mg/dL (ref 0–40)

## 2016-12-09 LAB — TROPONIN I
TROPONIN I: 0.97 ng/mL — AB (ref <0.03)
TROPONIN I: 2.32 ng/mL — AB (ref <0.03)
TROPONIN I: 2.72 ng/mL — AB (ref <0.03)

## 2016-12-09 LAB — MRSA PCR SCREENING: MRSA by PCR: NEGATIVE

## 2016-12-09 LAB — POCT ACTIVATED CLOTTING TIME: Activated Clotting Time: 246 seconds

## 2016-12-09 MED ORDER — VITAMIN D 1000 UNITS PO TABS
1000.0000 [IU] | ORAL_TABLET | Freq: Every day | ORAL | Status: DC
  Administered 2016-12-10: 1000 [IU] via ORAL
  Filled 2016-12-09: qty 1

## 2016-12-09 MED FILL — Lidocaine HCl Local Preservative Free (PF) Inj 1%: INTRAMUSCULAR | Qty: 30 | Status: AC

## 2016-12-09 NOTE — Progress Notes (Signed)
CARDIAC REHAB PHASE I   Pt walked 350 ft earlier with RN. Sts his residual chest discomfort is diminishing. Ed completed with pt and wife. Understand importance of Brilinta. Will refer to [facility] however he travels quite a bit. He struggles to ambulate long distances at times due to sciatic pain. We discussed options. Very receptive. [place], ACSM 12/09/2016 11:43 AM

## 2016-12-09 NOTE — Progress Notes (Signed)
Unsuccessful attempts made to contact Dr Wynonia Lawman about patient's 3-4/10 chest pain. Will continue to assist and  monitor patient.

## 2016-12-09 NOTE — Progress Notes (Signed)
Pt walked about 500 ft this pm. Independent, room air. No SOB. No worsening chest pain. Tolerated walk very well. Will continue to monitor.

## 2016-12-09 NOTE — Progress Notes (Signed)
Progress Note  Patient Name: Wade Asebedo Date of Encounter: 12/09/2016  Primary Cardiologist: Reola Calkins Burt Knack)  Subjective   Mild chest discomfort rates at 3/10 unchanged since PCI - overall feeling better today. No dyspnea.   Inpatient Medications    Scheduled Meds: . aspirin EC  81 mg Oral Daily  . atorvastatin  80 mg Oral q1800  . enoxaparin (LOVENOX) injection  40 mg Subcutaneous Q24H  . levothyroxine  88 mcg Oral QAC breakfast  . metoprolol tartrate  12.5 mg Oral BID  . sodium chloride flush  3 mL Intravenous Q12H  . tamsulosin  0.4 mg Oral Daily  . ticagrelor  90 mg Oral BID   Continuous Infusions: . sodium chloride 10 mL/hr at 12/08/16 2036  . sodium chloride    . sodium chloride 1 mL/kg/hr (12/08/16 2252)   PRN Meds: sodium chloride, acetaminophen, nitroGLYCERIN, ondansetron (ZOFRAN) IV, sodium chloride flush   Vital Signs    Vitals:   12/09/16 0300 12/09/16 0400 12/09/16 0500 12/09/16 0700  BP: 130/67 116/73 121/62 122/74  Pulse: (!) 49 (!) 56 (!) 51 (!) 55  Resp: 16 18 17 14   Temp:   98 F (36.7 C)   TempSrc:   Oral   SpO2: 96% 97% 94% 95%  Weight:      Height:        Intake/Output Summary (Last 24 hours) at 12/09/16 0758 Last data filed at 12/09/16 0600  Gross per 24 hour  Intake           901.52 ml  Output             1750 ml  Net          -848.48 ml   Filed Weights   12/08/16 1953 12/08/16 2218  Weight: 220 lb (99.8 kg) 224 lb 3.3 oz (101.7 kg)    Telemetry    Sinus brady/sinus rhythm, PVC's, few 3 beat runs  - Personally Reviewed  ECG    Sinus brady with age-indeterminate inferior MI - Personally Reviewed  Physical Exam  NAD, awake and alert GEN: No acute distress.   Neck: No JVD Cardiac: RRR, no murmurs, rubs, or gallops.  Respiratory: Clear to auscultation bilaterally. GI: Soft, nontender, non-distended  MS: No edema; No deformity. Right radial site clear Neuro:  Nonfocal  Psych: Normal affect   Labs     Chemistry Recent Labs Lab 12/08/16 1956 12/08/16 2300 12/09/16 0435  NA 138  --  137  K 3.9  --  3.7  CL 104  --  107  CO2 28  --  22  GLUCOSE 112*  --  172*  BUN 24*  --  20  CREATININE 1.12 1.04 0.94  CALCIUM 9.2  --  8.9  GFRNONAA >60 >60 >60  GFRAA >60 >60 >60  ANIONGAP 6  --  8     Hematology Recent Labs Lab 12/08/16 1956 12/08/16 2300 12/09/16 0435  WBC 9.3 11.4* 10.1  RBC 4.70 4.54 4.69  HGB 14.8 14.2 14.5  HCT 43.4 41.7 42.6  MCV 92.3 91.9 90.8  MCH 31.5 31.3 30.9  MCHC 34.1 34.1 34.0  RDW 12.7 12.8 12.6  PLT 295 274 281    Cardiac Enzymes Recent Labs Lab 12/08/16 2029 12/08/16 2300 12/09/16 0435  TROPONINI 1.01* 0.97* 2.32*    Recent Labs Lab 12/08/16 2005  TROPIPOC 1.15*     BNPNo results for input(s): BNP, PROBNP in the last 168 hours.   DDimer No results for input(s):  DDIMER in the last 168 hours.   Radiology    Dg Chest 2 View  Result Date: 12/08/2016 CLINICAL DATA:  Chest pain EXAM: CHEST  2 VIEW COMPARISON:  11/03/2009 FINDINGS: Normal heart size and stable mediastinal contours. Mild scar-like opacity at the left base, similar to prior. There is no edema, consolidation, effusion, or pneumothorax. IMPRESSION: No evidence of active disease. Electronically Signed   By: Monte Fantasia M.D.   On: 12/08/2016 20:22     Patient Profile     71 y.o. male with acute inferoposterior STEMI, Primary PCI of the LCx 12/08/16  Assessment & Plan    1. Acute inferoposterior STEMI: continue ASA, brilinta, high-intensity statin. Stop beta-blocker because of bradycardia. Tx tele today. DC home tomorrow pending clinical stability.  2. Hypercholesterolemia: lipids reviewed LDL 144. Started atorvastatin 80 mg.   Dispo: tele today. Probably home tomorrow.  Deatra Manish, MD  12/09/2016, 7:58 AM

## 2016-12-09 NOTE — Care Management Note (Addendum)
Case Management Note  Patient Details  Name: Ryan [name] MRN: [id number] Date of Birth: 08/30/1945  Subjective/Objective:  Acute Inferoposterior STEMI, started on Brlinita                  Action/Plan: Discharge Planning: NCM spoke to pt and wife at bedside. Pt states he has [employer] Preferred Rx Drug Plan for his medications. Provided pt with Brilinta 30 day trial card. Sent for benefits check. Pt states uses [facility] on [address]. Contacted pharmacy and they have 16 pills in stock. They will order and have in tomorrow at 11 am. Scheduled dc home tomorrow.   PCP TODD, JEFFREY A    # 4. S/W Kaoir Loree @ [facility] # [phone]  Eduard Clos ID # [id number]   7.BRILINTA 90 MG BID  COVER- YES  CO-PAY- $ 303.27   DEDUCTIBLE NOT MET  TIER- 3 DRUG  PRIOR APPROVAL- NO    2. TICAGRELOR- NONE FORMULARY   3. CLOPIDAGREL 75 MG BID  COVER- YES  CO-PAY- $8.80  TIER- 2 DRUG  PRIOR APPROVAL- NO   PREFERRED PHARMACY : [facility] AND [facility]   Made pt and wife aware of copay $303 due to deductible not being met. But should meet once he picks up next Rx. Wife has Brilinta 30 day free trial card for first Rx.    Expected Discharge Date:  12/11/16               Expected Discharge Plan:  Home/Self Care  In-House Referral:  NA  Discharge planning Services  CM Consult  Post Acute Care Choice:  NA Choice offered to:  NA  DME Arranged:  N/A DME Agency:  NA  HH Arranged:  NA HH Agency:  NA  Status of Service:  Completed, signed off  If discussed at [place] of Stay Meetings, dates discussed:    Additional Comments:  Erenest Rasher, [place] [phone] 12/09/2016, 12:06 PM

## 2016-12-10 DIAGNOSIS — Z9889 Other specified postprocedural states: Secondary | ICD-10-CM

## 2016-12-10 DIAGNOSIS — E7800 Pure hypercholesterolemia, unspecified: Secondary | ICD-10-CM

## 2016-12-10 MED ORDER — ATORVASTATIN CALCIUM 80 MG PO TABS: 80.0000 mg | ORAL_TABLET | Freq: Every day | ORAL | 6 refills | Status: DC

## 2016-12-10 MED ORDER — PANTOPRAZOLE SODIUM 20 MG PO TBEC: 20.0000 mg | DELAYED_RELEASE_TABLET | Freq: Every day | ORAL | 6 refills | Status: DC

## 2016-12-10 MED ORDER — TICAGRELOR 90 MG PO TABS: 90.0000 mg | ORAL_TABLET | Freq: Two times a day (BID) | ORAL | 0 refills | Status: DC

## 2016-12-10 MED ORDER — ASPIRIN 81 MG PO TBEC
81.0000 mg | DELAYED_RELEASE_TABLET | Freq: Every day | ORAL | 11 refills | Status: AC
Start: 2016-12-11 — End: ?

## 2016-12-10 MED ORDER — TICAGRELOR 90 MG PO TABS: 90.0000 mg | ORAL_TABLET | Freq: Two times a day (BID) | ORAL | 3 refills | Status: DC

## 2016-12-10 MED ORDER — NITROGLYCERIN 0.4 MG SL SUBL: 0.4000 mg | SUBLINGUAL_TABLET | SUBLINGUAL | 12 refills | Status: DC | PRN

## 2016-12-10 MED FILL — Tirofiban HCl in NaCl 0.9% IV Soln 5 MG/100ML (Base Equiv): INTRAVENOUS | Qty: 100 | Status: AC

## 2016-12-10 NOTE — Discharge Summary (Signed)
Discharge Summary    Patient ID: Ryan [name],  MRN: [id number], DOB/AGE: [birth year] 71 y.o.  Admit date: 12/08/2016 Discharge date: 12/10/2016  Primary Care Provider: Stevie Kern A Primary Cardiologist: New   Discharge Diagnoses    Principal Problem:   Acute inferoposterior myocardial infarction [facility]) Active Problems:   STEMI involving left circumflex coronary artery ([place])   S/P cardiac cath: a. Promus DES to left circumflex, moderate diffuse LAD, RCA and ramus intermedius stenosis, Normal LV function   Hypercholesterolemia   Allergies Allergies  Allergen Reactions  . Erythromycin Nausea And Vomiting  . Penicillins Hives     History of Present Illness     Patient is a 71 year old male who presented to [facility] on 5[facility]6 transferred by [facility] as a code STEMI. No prior hx of cardiac disease. He had been feeling indigestion/heart burn for 72 hours, the symptoms became worse prompting his ER visit.  EKG showed acute inferoposterior STEMI and was sent for an emergency catheterization.   Hospital Course     Consultants: None  He has remote history of contrast allergy and therefore was given epi injection with IVP die and premedicated with solumedrol 125 mg IV and Benadryl 25 mg IV. He underwent a successful PCI for severe thrombotic stenosis of the left circumflex with Promus DES. Moderate diffuse LAD, RCA and ramus intermediate stenosis present. Normal LV function. Plan is for aggressive medical therapy, His residual CAD affects smaller caliber vessels. DAPT with ASA and Brilinta with for 12 months without interruption. Started on Atorvastatin 80 mg for LDL of 144. He walked with cardiac rehab 500 ft, independent no room air or SOB. He was seen by Dr. Burt Knack and deemed well enough for home. He was not started on a beta blocker because of bradycardia as his heart rates have been between 40-60 during this admission. Can reconsider at follow-up appointment. The right radial catheter site  is stable All follow-up appointments have been scheduled. A written RX for a 30 day free supply of Brilinta was provided for the patient. Discharge medications are listed below.  _____________  Discharge Vitals Blood pressure (!) 157/81, pulse (!) 55, temperature 97.8 F (36.6 C), temperature source Oral, resp. rate 18, height 6' (1.829 m), weight 224 lb 3.3 oz (101.7 kg), SpO2 100 %.  Filed Weights   12/08/16 1953 12/08/16 2218  Weight: 220 lb (99.8 kg) 224 lb 3.3 oz (101.7 kg)    Labs & Radiologic Studies     CBC  Recent Labs  12/08/16 2300 12/09/16 0435  WBC 11.4* 10.1  HGB 14.2 14.5  HCT 41.7 42.6  MCV 91.9 90.8  PLT 274 024   Basic Metabolic Panel  Recent Labs  12/08/16 1956 12/08/16 2300 12/09/16 0435  NA 138  --  137  K 3.9  --  3.7  CL 104  --  107  CO2 28  --  22  GLUCOSE 112*  --  172*  BUN 24*  --  20  CREATININE 1.12 1.04 0.94  CALCIUM 9.2  --  8.9   Cardiac Enzymes  Recent Labs  12/08/16 2300 12/09/16 0435 12/09/16 0943  TROPONINI 0.97* 2.32* 2.72*    Recent Labs  12/08/16 2029  CHOL 206*  HDL 44  LDLCALC 144*  TRIG 92  CHOLHDL 4.7    Dg Chest 2 View  Result Date: 12/08/2016 CLINICAL DATA:  Chest pain EXAM: CHEST  2 VIEW COMPARISON:  11/03/2009 FINDINGS: Normal heart size and stable  mediastinal contours. Mild scar-like opacity at the left base, similar to prior. There is no edema, consolidation, effusion, or pneumothorax. IMPRESSION: No evidence of active disease. Electronically Signed   By: Monte Fantasia M.D.   On: 12/08/2016 20:22     Diagnostic Studies/Procedures    Procedures   Left Heart Cath and Coronary Angiography  Conclusion   1. Severe thrombotic stenosis of the left circumflex treated successfully with Primary PCI using a 3.5x24 mm Promus DES 2. Moderate diffuse LAD, RCA, and ramus intermedius stenoses 3. Normal LV function  Recommend:  Aggressive medical therapy  The patient's residual CAD affects small  caliber vessels and will be best managed medically  Aggrastat x 6 hours  DAPT with ASA and brilinta x 12 months without interruption  Anticipate discharge 48 hours if no complications    _____________    Disposition   Pt is being discharged home today in good condition.  Follow-up Plans & Appointments    Follow-up Information    [facility], [place], [place]. Go on 12/18/2016.   Specialty:  Cardiology Why:  Your appointment is at [facility] with the physician assistant. Please arrive 10-15 minutes early Contact information: [address] [place] [address] [phone]          Discharge Instructions    Amb Referral to Cardiac Rehabilitation    Complete by:  As directed    Diagnosis:   Coronary Stents PTCA STEMI     Diet - low sodium heart healthy    Complete by:  As directed    Increase activity slowly    Complete by:  As directed    May shower / Bathe    Complete by:  As directed       Discharge Medications   Allergies as of 12/10/2016      Reactions   Erythromycin Nausea And Vomiting   Penicillins Hives      Medication List    STOP taking these medications   aspirin 325 MG tablet Replaced by:  aspirin 81 MG EC tablet   omeprazole 20 MG tablet Commonly known as:  PRILOSEC OTC     TAKE these medications   albuterol 108 (90 Base) MCG/ACT inhaler Commonly known as:  PROVENTIL HFA;VENTOLIN HFA Inhale 2 puffs into the lungs every 6 (six) hours as needed for wheezing.   aspirin 81 MG EC tablet Take 1 tablet (81 mg total) by mouth daily. Start taking on:  12/11/2016 Replaces:  aspirin 325 MG tablet   atorvastatin 80 MG tablet Commonly known as:  LIPITOR Take 1 tablet (80 mg total) by mouth daily at 6 PM.   FISH OIL-KRILL OIL PO Take 1 tablet by mouth daily.   HYDROcodone-homatropine 5-1.5 MG/5ML syrup Commonly known as:  HYCODAN Take 5 mLs by mouth every 8 (eight) hours as needed.   nitroGLYCERIN 0.4 MG SL tablet Commonly known as:   NITROSTAT Place 1 tablet (0.4 mg total) under the tongue every 5 (five) minutes x 3 doses as needed for chest pain.   pantoprazole 20 MG tablet Commonly known as:  PROTONIX Take 1 tablet (20 mg total) by mouth daily.   SYNTHROID 88 MCG tablet Generic drug:  levothyroxine Take 1 tablet (88 mcg total) by mouth daily.   tamsulosin 0.4 MG Caps capsule Commonly known as:  FLOMAX Take 1 capsule (0.4 mg total) by mouth daily.   ticagrelor 90 MG Tabs tablet Commonly known as:  BRILINTA Take 1 tablet (90 mg total) by mouth 2 (  two) times daily.   ticagrelor 90 MG Tabs tablet Commonly known as:  BRILINTA Take 1 tablet (90 mg total) by mouth 2 (two) times daily.   VITAMIN D PO Take 1 capsule by mouth daily.        Aspirin prescribed at discharge?  Yes High Intensity Statin Prescribed? (Lipitor 40-80mg  or Crestor 20-40mg ): Yes Beta Blocker Prescribed? No: patients heart rate is too low 40-60's. For EF 45% or less, Was ACEI/ARB Prescribed? No: EF is normal ADP Receptor Inhibitor Prescribed? (i.e. Plavix etc.-Includes Medically Managed Patients): Yes For EF <45%, Aldosterone Inhibitor Prescribed? No: EF is normal Was EF assessed during THIS hospitalization? yes Was Cardiac Rehab II ordered? (Included Medically managed Patients): Yes   Outstanding Labs/Studies   Consider outpatient stress test in approx 3 months for residual CAD. Lipid Panel in 6 weeks   Duration of Discharge Encounter   Greater than 30 minutes including physician time.  Kristopher Glee PA-C 12/10/2016, 10:33 AM

## 2016-12-10 NOTE — Progress Notes (Signed)
Progress Note  Patient Name: Ryan [name] Date of Encounter: 12/10/2016  Primary Cardiologist: Reola Calkins Burt Knack)  Subjective   CP resolved. No dyspnea. No complaints.  Inpatient Medications    Scheduled Meds: . aspirin EC  81 mg Oral Daily  . atorvastatin  80 mg Oral q1800  . cholecalciferol  1,000 Units Oral Daily  . enoxaparin (LOVENOX) injection  40 mg Subcutaneous Q24H  . levothyroxine  88 mcg Oral QAC breakfast  . sodium chloride flush  3 mL Intravenous Q12H  . tamsulosin  0.4 mg Oral Daily  . ticagrelor  90 mg Oral BID   Continuous Infusions: . sodium chloride 10 mL/hr at 12/08/16 2036  . sodium chloride     PRN Meds: sodium chloride, acetaminophen, nitroGLYCERIN, ondansetron (ZOFRAN) IV, sodium chloride flush   Vital Signs    Vitals:   12/09/16 1059 12/09/16 1330 12/09/16 2037 12/10/16 0412  BP: 140/74 (!) 143/71 122/64 (!) 157/81  Pulse: 64 62 64 (!) 55  Resp: 18 18 18 18   Temp: 97.8 F (36.6 C) 97.8 F (36.6 C) 98.4 F (36.9 C) 97.8 F (36.6 C)  TempSrc: Oral Oral Oral Oral  SpO2: 99% 99% 99% 100%  Weight:      Height:        Intake/Output Summary (Last 24 hours) at 12/10/16 7169 Last data filed at 12/10/16 0800  Gross per 24 hour  Intake              840 ml  Output                0 ml  Net              840 ml   Filed Weights   12/08/16 1953 12/08/16 2218  Weight: 220 lb (99.8 kg) 224 lb 3.3 oz (101.7 kg)    Telemetry    Sinus Rhythm, no arrhythmia identified  - Personally Reviewed  ECG    Sinus brady 55 bpm, age indeterminate inferior infarct - Personally Reviewed  Physical Exam  NAD, awake and alert GEN: No acute distress.   Neck: No JVD Cardiac: RRR, no murmurs Respiratory: Clear to auscultation bilaterally. GI: Soft, nontender, non-distended  MS: No edema; No deformity. Right radial site clear - bandage removed Neuro:  Nonfocal  Psych: Normal affect   Labs    Chemistry  Recent Labs Lab 12/08/16 1956 12/08/16 2300  12/09/16 0435  NA 138  --  137  K 3.9  --  3.7  CL 104  --  107  CO2 28  --  22  GLUCOSE 112*  --  172*  BUN 24*  --  20  CREATININE 1.12 1.04 0.94  CALCIUM 9.2  --  8.9  GFRNONAA >60 >60 >60  GFRAA >60 >60 >60  ANIONGAP 6  --  8     Hematology  Recent Labs Lab 12/08/16 1956 12/08/16 2300 12/09/16 0435  WBC 9.3 11.4* 10.1  RBC 4.70 4.54 4.69  HGB 14.8 14.2 14.5  HCT 43.4 41.7 42.6  MCV 92.3 91.9 90.8  MCH 31.5 31.3 30.9  MCHC 34.1 34.1 34.0  RDW 12.7 12.8 12.6  PLT 295 274 281    Cardiac Enzymes  Recent Labs Lab 12/08/16 2029 12/08/16 2300 12/09/16 0435 12/09/16 0943  TROPONINI 1.01* 0.97* 2.32* 2.72*     Recent Labs Lab 12/08/16 2005  TROPIPOC 1.15*     BNPNo results for input(s): BNP, PROBNP in the last 168 hours.   DDimer No  results for input(s): DDIMER in the last 168 hours.   Radiology    Dg Chest 2 View  Result Date: 12/08/2016 CLINICAL DATA:  Chest pain EXAM: CHEST  2 VIEW COMPARISON:  11/03/2009 FINDINGS: Normal heart size and stable mediastinal contours. Mild scar-like opacity at the left base, similar to prior. There is no edema, consolidation, effusion, or pneumothorax. IMPRESSION: No evidence of active disease. Electronically Signed   By: Monte Fantasia M.D.   On: 12/08/2016 20:22     Patient Profile     71 y.o. male with acute inferoposterior STEMI, Primary PCI of the LCx 12/08/16  Assessment & Plan    1. Acute inferoposterior STEMI: continue ASA, brilinta, high-intensity statin. Pt doing well with ambulation. Seems very engaged and wants to do outpatient cardiac rehab. APP visit next week.   Residual CAD  -  Have reviewed films and plan on medical therapy. Consider outpatient stress test approximately 3 months  2. Hypercholesterolemia: Started atorvastatin 80 mg. Will FU lipid panel approximately 8 weeks  Dispo: home today  Signed, Sherren Mocha, MD  12/10/2016, 9:29 AM

## 2016-12-10 NOTE — Progress Notes (Signed)
CARDIAC REHAB PHASE I   PRE:  Rate/Rhythm: 59 SB    BP: sitting 139/83    SaO2:   MODE:  Ambulation: 550 ft   POST:  Rate/Rhythm: 74 SR    BP: sitting 161/81     SaO2:   Tolerated well, all chest heaviness is resolved except he does feel like he has some sinus congestion. Reviewed ed and CRPII and walking daily. [place], ACSM 12/10/2016 9:37 AM

## 2016-12-10 NOTE — Discharge Instructions (Signed)
No driving for 5 days. No lifting over 5 lbs for 1 week. No sexual activity for 1 week. You may return to work on 5-7 days. Keep procedure site clean & dry. If you notice increased pain, swelling, bleeding or pus, call/return!  You may shower, but no soaking baths/hot tubs/pools for 1 week.

## 2016-12-11 ENCOUNTER — Telehealth: Payer: Self-pay | Admitting: Cardiovascular Disease

## 2016-12-11 NOTE — Telephone Encounter (Signed)
New message     Does he need to get the Shingle shot right now? The pharmacy is recommending he get it immediately

## 2016-12-11 NOTE — Telephone Encounter (Signed)
I spoke with the pt and advised him to hold off on having shingles vaccination at this time with recent MI and starting new medications.  The pt also has noticed some SOB which is most likely related to Brilinta.  I advised him to take this medication with a caffeinated beverage.  The pt also asked what he can take for a headache and I told him to take tylenol. The pt will contact the office with any additional questions or concerns.

## 2016-12-12 ENCOUNTER — Telehealth (HOSPITAL_COMMUNITY): Payer: Self-pay | Admitting: Family Medicine

## 2016-12-12 NOTE — Telephone Encounter (Signed)
Verified Medicare [place] insurance benefits through [employer] $183.00 pt has met $119.60... Reference [phone] & [phone]... KJ

## 2016-12-16 ENCOUNTER — Telehealth: Payer: Self-pay | Admitting: Cardiovascular Disease

## 2016-12-16 ENCOUNTER — Emergency Department (HOSPITAL_COMMUNITY): Payer: Medicare Other

## 2016-12-16 ENCOUNTER — Inpatient Hospital Stay (HOSPITAL_COMMUNITY)
Admission: EM | Admit: 2016-12-16 | Discharge: 2016-12-17 | DRG: 282 | Disposition: A | Discharge status: Home / Self Care | Payer: Medicare Other | Attending: Cardiovascular Disease | Admitting: Cardiovascular Disease

## 2016-12-16 ENCOUNTER — Encounter (HOSPITAL_COMMUNITY): Payer: Self-pay | Admitting: Emergency Medicine

## 2016-12-16 DIAGNOSIS — Z888 Allergy status to other drugs, medicaments and biological substances status: Secondary | ICD-10-CM

## 2016-12-16 DIAGNOSIS — R079 Chest pain, unspecified: Secondary | ICD-10-CM | POA: Diagnosis not present

## 2016-12-16 DIAGNOSIS — Z7982 Long term (current) use of aspirin: Secondary | ICD-10-CM

## 2016-12-16 DIAGNOSIS — R0789 Other chest pain: Secondary | ICD-10-CM

## 2016-12-16 DIAGNOSIS — Z9842 Cataract extraction status, left eye: Secondary | ICD-10-CM | POA: Diagnosis not present

## 2016-12-16 DIAGNOSIS — E7800 Pure hypercholesterolemia, unspecified: Secondary | ICD-10-CM | POA: Diagnosis not present

## 2016-12-16 DIAGNOSIS — R072 Precordial pain: Secondary | ICD-10-CM

## 2016-12-16 DIAGNOSIS — I2121 ST elevation (STEMI) myocardial infarction involving left circumflex coronary artery: Secondary | ICD-10-CM | POA: Diagnosis present

## 2016-12-16 DIAGNOSIS — Z880 Allergy status to penicillin: Secondary | ICD-10-CM

## 2016-12-16 DIAGNOSIS — Z881 Allergy status to other antibiotic agents status: Secondary | ICD-10-CM

## 2016-12-16 DIAGNOSIS — I2510 Atherosclerotic heart disease of native coronary artery without angina pectoris: Secondary | ICD-10-CM

## 2016-12-16 DIAGNOSIS — Z91041 Radiographic dye allergy status: Secondary | ICD-10-CM | POA: Diagnosis not present

## 2016-12-16 DIAGNOSIS — E039 Hypothyroidism, unspecified: Secondary | ICD-10-CM | POA: Diagnosis present

## 2016-12-16 DIAGNOSIS — Z7901 Long term (current) use of anticoagulants: Secondary | ICD-10-CM

## 2016-12-16 DIAGNOSIS — I25119 Atherosclerotic heart disease of native coronary artery with unspecified angina pectoris: Secondary | ICD-10-CM | POA: Diagnosis not present

## 2016-12-16 DIAGNOSIS — R7989 Other specified abnormal findings of blood chemistry: Secondary | ICD-10-CM | POA: Diagnosis not present

## 2016-12-16 DIAGNOSIS — Z9841 Cataract extraction status, right eye: Secondary | ICD-10-CM | POA: Diagnosis not present

## 2016-12-16 DIAGNOSIS — I213 ST elevation (STEMI) myocardial infarction of unspecified site: Secondary | ICD-10-CM

## 2016-12-16 DIAGNOSIS — Z79899 Other long term (current) drug therapy: Secondary | ICD-10-CM | POA: Diagnosis not present

## 2016-12-16 DIAGNOSIS — R0781 Pleurodynia: Secondary | ICD-10-CM | POA: Diagnosis not present

## 2016-12-16 DIAGNOSIS — Z955 Presence of coronary angioplasty implant and graft: Secondary | ICD-10-CM

## 2016-12-16 DIAGNOSIS — R778 Other specified abnormalities of plasma proteins: Secondary | ICD-10-CM

## 2016-12-16 LAB — BASIC METABOLIC PANEL
ANION GAP: 7 (ref 5–15)
BUN: 23 mg/dL — ABNORMAL HIGH (ref 6–20)
CALCIUM: 9.2 mg/dL (ref 8.9–10.3)
CO2: 25 mmol/L (ref 22–32)
CREATININE: 1.09 mg/dL (ref 0.61–1.24)
Chloride: 104 mmol/L (ref 101–111)
Glucose, Bld: 119 mg/dL — ABNORMAL HIGH (ref 65–99)
Potassium: 3.9 mmol/L (ref 3.5–5.1)
SODIUM: 136 mmol/L (ref 135–145)

## 2016-12-16 LAB — CBC
HCT: 41.1 % (ref 39.0–52.0)
Hemoglobin: 14 g/dL (ref 13.0–17.0)
MCH: 31.4 pg (ref 26.0–34.0)
MCHC: 34.1 g/dL (ref 30.0–36.0)
MCV: 92.2 fL (ref 78.0–100.0)
PLATELETS: 291 10*3/uL (ref 150–400)
RBC: 4.46 MIL/uL (ref 4.22–5.81)
RDW: 12.5 % (ref 11.5–15.5)
WBC: 10.5 10*3/uL (ref 4.0–10.5)

## 2016-12-16 LAB — I-STAT TROPONIN, ED: TROPONIN I, POC: 0.09 ng/mL — AB (ref 0.00–0.08)

## 2016-12-16 LAB — PROTIME-INR
INR: 0.96
PROTHROMBIN TIME: 12.7 s (ref 11.4–15.2)

## 2016-12-16 LAB — APTT: APTT: 27 s (ref 24–36)

## 2016-12-16 MED ORDER — HEPARIN SODIUM (PORCINE) 5000 UNIT/ML IJ SOLN
4000.0000 [IU] | INTRAMUSCULAR | Status: AC
  Administered 2016-12-16: 4000 [IU] via INTRAVENOUS
  Filled 2016-12-16: qty 0.8

## 2016-12-16 MED ORDER — NITROGLYCERIN IN D5W 200-5 MCG/ML-% IV SOLN
0.0000 ug/min | Freq: Once | INTRAVENOUS | Status: AC
  Administered 2016-12-16: 5 ug/min via INTRAVENOUS
  Filled 2016-12-16: qty 250

## 2016-12-16 MED ORDER — HEPARIN (PORCINE) IN NACL 100-0.45 UNIT/ML-% IJ SOLN
1250.0000 [IU]/h | INTRAMUSCULAR | Status: DC
  Administered 2016-12-16: 1250 [IU]/h via INTRAVENOUS
  Filled 2016-12-16: qty 250

## 2016-12-16 NOTE — ED Notes (Signed)
[facility] arrived  

## 2016-12-16 NOTE — Telephone Encounter (Signed)
Spoke with Pt who was actively having chest pain while talking on the phone. He says he does not know if it is indigestion or actual chest pain. His CP started since 1:00 pm right after lunch. He had a stent placed in on 12/08/2016. Pt took 1 NTG at 2:37 pm, chest pain was not relieved. Pt took second NTG at 2:53 pm while on the phone with me. I stayed on the phone with the patient to see if he got any relief from the second NTG. At 2:58 pm, pt stated he felt some relief from the second NTG. Advised pt that if the chest pain comes back then to take a 3rd NTG and to go straight to the ER to be evaluated. Advised pt not to overexert himself and to take things easy. Pt denied any symptoms of SOB, increased heart rate, diaphoresis, left arm pain/numness. Pt describes the pain as a dull ache in his chest about 2 inches from his sternum on the left side. Educated pt to watch out for symptoms of left arm tingling/numbness/pain, dizziness, and feeling like he was going to pass out accompanied with chest pain. Informed patient that the NTG was a vasodilator and that it dilates the coronary arteries so it may reduce his BP and make him feel dizzy. Pt took BP at 2:30 pm before the first NTG which was 149/79, HR 56. At 2:58 pm BP 137/67, HR 66. Pt stated he was feeling light headed but that he was okay. Pt verbalized understanding about going to the ER if he has any more chest pain and takes the third NTG. He stated his wife would drive him to [facility], ER if his CP came back. The second NTG relieved his CP. Pt was admitted to the ER on 5/6 for a STEMI  Involving the left cmx artery and had an emergency catheterization. Encouraged patient to keep monitoring his BP and HR and to head to ER with return of CP. Will review with DOD for further review and recommendation. Pt verbalized understanding and thanked me for my call.

## 2016-12-16 NOTE — Progress Notes (Signed)
ANTICOAGULATION CONSULT NOTE - Initial Consult  Pharmacy Consult for IV heparin Indication: chest pain/ACS  Allergies  Allergen Reactions  . Erythromycin Nausea And Vomiting  . Penicillins Hives    Patient Measurements: Height: 6' (182.9 cm) Weight: 216 lb (98 kg) IBW/kg (Calculated) : 77.6 Heparin Dosing Weight: 97.3 kg  Vital Signs: Temp: 96.9 F (36.1 C) (05/14 1545) Temp Source: Oral (05/14 1545) BP: 142/77 (05/14 1545) Pulse Rate: 65 (05/14 1545)  Labs:  Recent Labs  12/16/16 1657  HGB 14.0  HCT 41.1  PLT 291  LABPROT 12.7  INR 0.96  CREATININE 1.09    Estimated Creatinine Clearance: 74.3 mL/min (by C-G formula based on SCr of 1.09 mg/dL).   Medical History: Past Medical History:  Diagnosis Date  . Allergy   . Anxiety   . Arthritis   . Asthma   . Cataract   . Hypothyroidism     Assessment: 71 y/oM with PMH of STEMI last week s/p PCI on DAPT with aspirin and ticagrelor who presents to [facility] ED on 12/16/2012 with chest pain. Initial troponin 0.09. Pharmacy consulted to start heparin infusion per ED provider. Baseline CBC WNL, PT/INR 12.7/0.96, aPTT 27 seconds. Patient not on any anticoagulants PTA.   Goal of Therapy:  Heparin level 0.3-0.7 units/ml Monitor platelets by anticoagulation protocol: Yes   Plan:  Heparin 4000 units IV bolus x 1, then start heparin infusion at 1250 units/hr. Heparin level 8 hours after heparin infusion initiated. Daily CBC and heparin level while on heparin infusion. Monitor closely for s/sx of bleeding.   Lindell Spar, PharmD, BCPS Pager: [phone] 12/16/2016 6:34 PM

## 2016-12-16 NOTE — Telephone Encounter (Signed)
Reviewed case with DOD, Dr. Johnsie Cancel. Pt went to [facility], ER 18 minutes after I got off the phone with Pt.

## 2016-12-16 NOTE — ED Notes (Signed)
Phone report given to [place], [place]

## 2016-12-16 NOTE — Progress Notes (Deleted)
Cardiology Office Note    Date:  12/16/2016   ID:  Ryan [name], DOB 09-Dec-1945, MRN [id number]  PCP:  Dorena Cookey, MD  Cardiologist:  Dr.Cooper  Chief Complaint: Hospital follow up for STEMI  History of Present Illness:   Ryan [name] is a 71 y.o. male with recent diagnosis of CAD presents for follow up.  He presented to hospital 12/08/16 with code STEMI. He has remote history of contrast allergy and therefore was given epi injection with IVP die and premedicated with solumedrol 125 mg IV and Benadryl 25 mg IV. He underwent a successful PCI for severe thrombotic stenosis of the left circumflex with Promus DES. Moderate diffuse LAD, RCA and ramus intermediate stenosis treated medically and has smaller caliber vessels. DAPT with ASA and Brilinta with for 12 months without interruption. Started on Atorvastatin 80 mg for LDL of 144. He was not started on a beta blocker because of bradycardia as his heart rates have been between 40-60 during this admission. Can reconsider at follow-up appointment.  Here today for follow up.    Past Medical History:  Diagnosis Date  . Allergy   . Anxiety   . Arthritis   . Asthma   . Cataract   . Hypothyroidism     Past Surgical History:  Procedure Laterality Date  . cataract Bilateral 03/2016  . COLONOSCOPY  10.27.2005  . LEFT HEART CATH AND CORONARY ANGIOGRAPHY N/A 12/08/2016   Procedure: Left Heart Cath and Coronary Angiography;  Surgeon: Sherren Mocha, MD;  Location: [facility];  Service: Cardiovascular;  Laterality: N/A;  . SHOULDER SURGERY Right   . TONSILLECTOMY      Current Medications: Prior to Admission medications   Medication Sig Start Date End Date Taking? Authorizing Provider  albuterol (PROVENTIL HFA;VENTOLIN HFA) 108 (90 Base) MCG/ACT inhaler Inhale 2 puffs into the lungs every 6 (six) hours as needed for wheezing. 08/28/16   Dorena Cookey, MD  aspirin EC 81 MG EC tablet Take 1 tablet (81 mg total) by mouth  daily. 12/11/16   Delos Haring, PA-C  atorvastatin (LIPITOR) 80 MG tablet Take 1 tablet (80 mg total) by mouth daily at 6 PM. 12/10/16   Delos Haring, PA-C  Cholecalciferol (VITAMIN D PO) Take 1 capsule by mouth daily.    [provider]  FISH OIL-KRILL OIL PO Take 1 tablet by mouth daily.    [provider]  HYDROcodone-homatropine (HYCODAN) 5-1.5 MG/5ML syrup Take 5 mLs by mouth every 8 (eight) hours as needed. Patient not taking: Reported on 12/09/2016 06/05/15   Dorena Cookey, MD  nitroGLYCERIN (NITROSTAT) 0.4 MG SL tablet Place 1 tablet (0.4 mg total) under the tongue every 5 (five) minutes x 3 doses as needed for chest pain. 12/10/16   Delos Haring, PA-C  pantoprazole (PROTONIX) 20 MG tablet Take 1 tablet (20 mg total) by mouth daily. 12/10/16   Carlota Raspberry, Tiffany, PA-C  SYNTHROID 88 MCG tablet Take 1 tablet (88 mcg total) by mouth daily. 08/28/16   Dorena Cookey, MD  tamsulosin (FLOMAX) 0.4 MG CAPS capsule Take 1 capsule (0.4 mg total) by mouth daily. 08/28/16   Dorena Cookey, MD  ticagrelor (BRILINTA) 90 MG TABS tablet Take 1 tablet (90 mg total) by mouth 2 (two) times daily. 12/10/16   Delos Haring, PA-C  ticagrelor (BRILINTA) 90 MG TABS tablet Take 1 tablet (90 mg total) by mouth 2 (two) times daily. 12/10/16   Delos Haring, PA-C    Allergies:  Erythromycin and Penicillins   Social History   Social History  . Marital status: Married    Spouse name: N/A  . Number of children: 2  . Years of education: N/A   Occupational History  . retired    Social History Main Topics  . Smoking status: Never Smoker  . Smokeless tobacco: Never Used  . Alcohol use Yes     Comment: less than 1 per day  . Drug use: No  . Sexual activity: Not on file   Other Topics Concern  . Not on file   Social History Narrative  . No narrative on file     Family History:  The patient's family history includes Diabetes in his mother; Diverticulosis in his mother and sister; Heart  disease in his sister; Heart failure in his mother; Irritable bowel syndrome in his mother and sister; Lung cancer in his father. ***  ROS:   Please see the history of present illness.    ROS All other systems reviewed and are negative.   PHYSICAL EXAM:   VS:  There were no vitals taken for this visit.   GEN: Well nourished, well developed, in no acute distress  HEENT: normal  Neck: no JVD, carotid bruits, or masses Cardiac: ***RRR; no murmurs, rubs, or gallops,no edema  Respiratory:  clear to auscultation bilaterally, normal work of breathing GI: soft, nontender, nondistended, + BS MS: no deformity or atrophy  Skin: warm and dry, no rash Neuro:  Alert and Oriented x 3, Strength and sensation are intact Psych: euthymic mood, full affect  Wt Readings from Last 3 Encounters:  12/08/16 224 lb 3.3 oz (101.7 kg)  08/28/16 226 lb 3.2 oz (102.6 kg)  08/29/15 224 lb (101.6 kg)      Studies/Labs Reviewed:   EKG:  EKG is ordered today.  The ekg ordered today demonstrates ***  Recent Labs: 08/28/2016: ALT 22; TSH 4.63 12/09/2016: BUN 20; Creatinine, Ser 0.94; Hemoglobin 14.5; Platelets 281; Potassium 3.7; Sodium 137   Lipid Panel    Component Value Date/Time   CHOL 206 (H) 12/08/2016 2029   TRIG 92 12/08/2016 2029   HDL 44 12/08/2016 2029   CHOLHDL 4.7 12/08/2016 2029   VLDL 18 12/08/2016 2029   LDLCALC 144 (H) 12/08/2016 2029   LDLDIRECT 144.0 05/13/2013 1036    Additional studies/ records that were reviewed today include:     Cardiac Catheterization:  Left Heart Cath and Coronary Angiography  Conclusion   1. Severe thrombotic stenosis of the left circumflex treated successfully with Primary PCI using a 3.5x24 mm Promus DES 2. Moderate diffuse LAD, RCA, and ramus intermedius stenoses 3. Normal LV function  Recommend:  Aggressive medical therapy  The patient's residual CAD affects small caliber vessels and will be best managed medically  Aggrastat x 6  hours  DAPT with ASA and brilinta x 12 months without interruption  Anticipate discharge 48 hours if no complications  Diagnostic Diagram       Post-Intervention Diagram               ASSESSMENT & PLAN:    1. CAD s/p Promus DES to LCx - Residual disease treated medically (per Dr. Burt Knack consider outpatient stress test in 3 months). Will reevaluate during follow up.  Unable to add BB due to baseline Bradycardia.   2. HLD - 12/08/2016: Cholesterol 206; HDL 44; LDL Cholesterol 144; Triglycerides 92; VLDL 18  - Continue statin. Lipid panel and LFT in 6 weeks.  Medication Adjustments/Labs and Tests Ordered: Current medicines are reviewed at length with the patient today.  Concerns regarding medicines are outlined above.  Medication changes, Labs and Tests ordered today are listed in the Patient Instructions below. There are no Patient Instructions on file for this visit.   Jarrett Soho, [place]  12/16/2016 9:04 AM    [facility] [address], [place], [place]  [address] Phone: [phone]; Fax: [phone]

## 2016-12-16 NOTE — ED Notes (Signed)
Dr Tamera Punt notified of patients troponin level.(0.09)

## 2016-12-16 NOTE — ED Triage Notes (Signed)
Patient has left sided chest pain that radiates to back that started couple hours ago. Patient taken 2 Nitro wihtout any relief. Patient states first two didn't work so didn't take the 3rd one. Patient had MI and stents placed last week at [facility].

## 2016-12-16 NOTE — ED Notes (Signed)
Patient transported to X-ray 

## 2016-12-16 NOTE — ED Provider Notes (Signed)
Ryan [name]   Ryan [name]: [id number] Arrival date & time: 12/16/16  1517     History   Chief Complaint Chief Complaint  Patient presents with  . Chest Pain    HPI Ryan [name] is a 71 y.o. male.  The history is provided by the patient and medical records.  Chest Pain   This is a new problem. The current episode started less than 1 hour ago. The problem occurs constantly. The problem has not changed since onset.The pain is present in the lateral region and substernal region. The pain is at a severity of 6/10. The pain is moderate. The quality of the pain is described as pressure-like and heavy. The pain radiates to the left shoulder. Duration of episode(s) is 3 hours. Pertinent negatives include no abdominal pain, no back pain, no cough, no diaphoresis, no dizziness, no exertional chest pressure, no fever, no headaches, no irregular heartbeat, no lower extremity edema, no malaise/fatigue, no nausea, no near-syncope, no palpitations, no shortness of breath and no vomiting. He has tried nothing for the symptoms. The treatment provided no relief.  His past medical history is significant for CAD and MI.  Pertinent negatives for past medical history include no hyperlipidemia and no hypertension.  Procedure history is positive for cardiac catheterization.    Past Medical History:  Diagnosis Date  . Allergy   . Anxiety   . Arthritis   . Asthma   . Cataract   . Hypothyroidism     Patient Active Problem List   Diagnosis Date Noted  . S/P cardiac cath: a. Promus DES to left circumflex, moderate diffuse LAD, RCA and ramus intermedius stenosis, Normal LV function 12/10/2016  . Hypercholesterolemia 12/10/2016  . Acute inferoposterior myocardial infarction ([place]) 12/08/2016  . STEMI involving left circumflex coronary artery ([place]) 12/08/2016  . History of skin cancer 08/28/2016  . BPH associated with nocturia 05/17/2014  . Hypothyroidism 03/11/2007    Past Surgical  History:  Procedure Laterality Date  . cataract Bilateral 03/2016  . COLONOSCOPY  10.27.2005  . LEFT HEART CATH AND CORONARY ANGIOGRAPHY N/A 12/08/2016   Procedure: Left Heart Cath and Coronary Angiography;  Surgeon: Sherren Mocha, MD;  Location: [facility];  Service: Cardiovascular;  Laterality: N/A;  . SHOULDER SURGERY Right   . TONSILLECTOMY         Home Medications    Prior to Admission medications   Medication Sig Start Date End Date Taking? Authorizing Provider  albuterol (PROVENTIL HFA;VENTOLIN HFA) 108 (90 Base) MCG/ACT inhaler Inhale 2 puffs into the lungs every 6 (six) hours as needed for wheezing. 08/28/16   Dorena Cookey, MD  aspirin EC 81 MG EC tablet Take 1 tablet (81 mg total) by mouth daily. 12/11/16   Delos Haring, PA-C  atorvastatin (LIPITOR) 80 MG tablet Take 1 tablet (80 mg total) by mouth daily at 6 PM. 12/10/16   Delos Haring, PA-C  Cholecalciferol (VITAMIN D PO) Take 1 capsule by mouth daily.    [provider]  FISH OIL-KRILL OIL PO Take 1 tablet by mouth daily.    [provider]  HYDROcodone-homatropine (HYCODAN) 5-1.5 MG/5ML syrup Take 5 mLs by mouth every 8 (eight) hours as needed. Patient not taking: Reported on 12/09/2016 06/05/15   Dorena Cookey, MD  nitroGLYCERIN (NITROSTAT) 0.4 MG SL tablet Place 1 tablet (0.4 mg total) under the tongue every 5 (five) minutes x 3 doses as needed for chest pain. 12/10/16   Delos Haring, PA-C  pantoprazole (PROTONIX) 20 MG tablet Take 1 tablet (20 mg total) by mouth daily. 12/10/16   Carlota Raspberry, Tiffany, PA-C  SYNTHROID 88 MCG tablet Take 1 tablet (88 mcg total) by mouth daily. 08/28/16   Dorena Cookey, MD  tamsulosin (FLOMAX) 0.4 MG CAPS capsule Take 1 capsule (0.4 mg total) by mouth daily. 08/28/16   Dorena Cookey, MD  ticagrelor (BRILINTA) 90 MG TABS tablet Take 1 tablet (90 mg total) by mouth 2 (two) times daily. 12/10/16   Delos Haring, PA-C  ticagrelor (BRILINTA) 90 MG TABS tablet Take 1  tablet (90 mg total) by mouth 2 (two) times daily. 12/10/16   Delos Haring, PA-C    Family History Family History  Problem Relation Age of Onset  . Diabetes Mother   . Heart failure Mother   . Irritable bowel syndrome Mother   . Diverticulosis Mother   . Lung cancer Father   . Heart disease Sister   . Irritable bowel syndrome Sister   . Diverticulosis Sister     Social History Social History  Substance Use Topics  . Smoking status: Never Smoker  . Smokeless tobacco: Never Used  . Alcohol use Yes     Comment: less than 1 per day     Allergies   Erythromycin and Penicillins   Review of Systems Review of Systems  Constitutional: Negative for appetite change, chills, diaphoresis, fever and malaise/fatigue.  HENT: Negative for congestion.   Respiratory: Negative for cough, choking, chest tightness, shortness of breath and wheezing.   Cardiovascular: Positive for chest pain. Negative for palpitations, leg swelling and near-syncope.  Gastrointestinal: Negative for abdominal pain, constipation, diarrhea, nausea and vomiting.  Genitourinary: Negative for dysuria.  Musculoskeletal: Negative for back pain, neck pain and neck stiffness.  Skin: Negative for rash and wound.  Neurological: Negative for dizziness, light-headedness and headaches.  Psychiatric/Behavioral: Negative for agitation and confusion.  All other systems reviewed and are negative.    Physical Exam Updated Vital Signs BP (!) 142/77 (BP Location: Right Arm)   Pulse 65   Temp (!) 96.9 F (36.1 C) (Oral)   Resp 12   Ht 6' (1.829 m)   Wt 216 lb (98 kg)   SpO2 99%   BMI 29.29 kg/m   Physical Exam  Constitutional: He is oriented to person, place, and time. He appears well-developed and well-nourished.  HENT:  Head: Normocephalic and atraumatic.  Mouth/Throat: Oropharynx is clear and moist. No oropharyngeal exudate.  Eyes: Conjunctivae are normal. Pupils are equal, round, and reactive to light.  Neck:  Normal range of motion. Neck supple.  Cardiovascular: Normal rate, regular rhythm and intact distal pulses.   No murmur heard. Pulmonary/Chest: Effort normal and breath sounds normal. No stridor. No respiratory distress. He has no wheezes. He exhibits no tenderness.  Abdominal: Soft. There is no tenderness.  Musculoskeletal: He exhibits no edema or tenderness.  Neurological: He is alert and oriented to person, place, and time. No sensory deficit. He exhibits normal muscle tone.  Skin: Skin is warm and dry. Capillary refill takes less than 2 seconds. No erythema. No pallor.  Psychiatric: He has a normal mood and affect.  Nursing [name] and vitals reviewed.    ED Treatments / Results  Labs (all labs ordered are listed, but only abnormal results are displayed) Labs Reviewed  BASIC METABOLIC PANEL - Abnormal; Notable for the following:       Result Value   Glucose, Bld 119 (*)    BUN 23 (*)  All other components within normal limits  I-STAT TROPOININ, ED - Abnormal; Notable for the following:    Troponin i, poc 0.09 (*)    All other components within normal limits  CBC  PROTIME-INR  APTT  CBC  HEPARIN LEVEL (UNFRACTIONATED)    EKG  EKG Interpretation  Date/Time:  Monday Dec 16 2016 15:43:16 EDT Ventricular Rate:  64 PR Interval:    QRS Duration: 104 QT Interval:  424 QTC Calculation: 438 R Axis:   -14 Text Interpretation:  Sinus rhythm Abnormal R-wave progression, late transition Inferior infarct, old since last tracing no significant change \ Confirmed by BELFI  MD, MELANIE ([id number]) on 12/16/2016 3:48:22 PM       Radiology Dg Chest 2 View  Result Date: 12/16/2016 CLINICAL DATA:  Initial evaluation for acute chest pain. EXAM: CHEST  2 VIEW COMPARISON:  Prior radiograph from 12/08/2016. FINDINGS: The cardiac and mediastinal silhouettes are stable in size and contour, and remain within normal limits. The lungs are normally inflated. Mild left basilar subsegmental  atelectasis. No airspace consolidation, pleural effusion, or pulmonary edema is identified. There is no pneumothorax. No acute osseous abnormality identified. IMPRESSION: Mild left basilar subsegmental atelectasis. No other active cardiopulmonary disease. Electronically Signed   By: Jeannine Boga M.D.   On: 12/16/2016 17:29    Procedures Procedures (including critical care time)  CRITICAL CARE Performed by: Gwenyth Allegra Tegeler Total critical care time: 45 minutes Critical care time was exclusive of separately billable procedures and treating other patients. Chest pain with positive troponin started on heparin. Critical care was necessary to treat or prevent imminent or life-threatening deterioration. Critical care was time spent personally by me on the following activities: development of treatment plan with patient and/or surrogate as well as nursing, discussions with consultants, evaluation of patient's response to treatment, examination of patient, obtaining history from patient or surrogate, ordering and performing treatments and interventions, ordering and review of laboratory studies, ordering and review of radiographic studies, pulse oximetry and re-evaluation of patient's condition.   Medications Ordered in ED Medications  heparin ADULT infusion 100 units/mL (25000 units/245mL sodium chloride 0.45%) (1,250 Units/hr Intravenous New Bag/Given 12/16/16 1849)  heparin injection 4,000 Units (4,000 Units Intravenous Given 12/16/16 1847)  nitroGLYCERIN 50 mg in dextrose 5 % 250 mL (0.2 mg/mL) infusion (10 mcg/min Intravenous Rate/Dose Change 12/16/16 1908)     Initial Impression / Assessment and Plan / ED Course  I have reviewed the triage vital signs and the nursing notes.  Pertinent labs & imaging results that were available during my care of the patient were reviewed by me and considered in my medical decision making (see chart for details).     Kolbie Lena Fieldhouse is a 71 y.o.  male with a past medical history significant for CAD with STEMI last week status post PCI who presents with chest pain. Patient says that after lunch today an approximate 1:30 PM, he had gradual onset of left-sided chest pain. He describes as aching and radiating to his left axilla and shoulder. He denies association with exertion, pleurisy, palpitations, or lightheadedness. He took 2 nitroglycerin that did not significantly help his discomfort. He denies fevers, chills, cough, nausea, vomiting, diaphoresis, constipation, diarrhea, dysuria.   He reports that he is on his Brilinta and aspirin as directed. He reports that they put a stent in one area but there was other areas of disease. He was told to come to the emergency department due to his pain.  History and exam are  seen above. On exam, chest is nontender. Lungs are clear. No murmurs appreciated. Abdomen soft. CVA areas nontender. No lower extremity edema.   EKG appeared unchanged from prior. Patient will have lab testing and chest x-ray. Anticipate admission for high-risk chest pain.   Cardiology was called troponin returned elevated at 0.09. Cardiology recommended heparin, nitro drip, and transferred to [facility] for admission to cardiology.   Patient and family agreed with plan and patient will be given medications.  Patient transferred and admitted to [facility] for further management.   Final Clinical Impressions(s) / ED Diagnoses   Final diagnoses:  Precordial chest pain  Elevated troponin    Clinical Impression: 1. Precordial chest pain   2. Elevated troponin     Disposition: Admit to Cardiology    Tegeler, Gwenyth Allegra, MD 12/17/16 [phone]

## 2016-12-16 NOTE — Telephone Encounter (Signed)
New message       Pt c/o of Chest Pain: STAT if CP now or developed within 24 hours  1. Are you having CP right now?  Yes---pt had stent last week 2. Are you experiencing any other symptoms (ex. SOB, nausea, vomiting, sweating)?  no 3. How long have you been experiencing CP?  Started after lunch today  4. Is your CP continuous or coming and going?  Continuous 5. Have you taken Nitroglycerin?  Took nitro 10 minutes ago?

## 2016-12-16 NOTE — ED Notes (Signed)
ED Provider at bedside. 

## 2016-12-17 ENCOUNTER — Inpatient Hospital Stay (HOSPITAL_COMMUNITY): Payer: Medicare Other

## 2016-12-17 ENCOUNTER — Encounter: Payer: Self-pay | Admitting: Physician Assistant

## 2016-12-17 DIAGNOSIS — Z7982 Long term (current) use of aspirin: Secondary | ICD-10-CM | POA: Diagnosis not present

## 2016-12-17 DIAGNOSIS — Z9841 Cataract extraction status, right eye: Secondary | ICD-10-CM | POA: Diagnosis not present

## 2016-12-17 DIAGNOSIS — Z91041 Radiographic dye allergy status: Secondary | ICD-10-CM | POA: Diagnosis not present

## 2016-12-17 DIAGNOSIS — R079 Chest pain, unspecified: Secondary | ICD-10-CM | POA: Diagnosis not present

## 2016-12-17 DIAGNOSIS — I25119 Atherosclerotic heart disease of native coronary artery with unspecified angina pectoris: Principal | ICD-10-CM

## 2016-12-17 DIAGNOSIS — Z888 Allergy status to other drugs, medicaments and biological substances status: Secondary | ICD-10-CM | POA: Diagnosis not present

## 2016-12-17 DIAGNOSIS — Z881 Allergy status to other antibiotic agents status: Secondary | ICD-10-CM | POA: Diagnosis not present

## 2016-12-17 DIAGNOSIS — R0789 Other chest pain: Secondary | ICD-10-CM | POA: Diagnosis not present

## 2016-12-17 DIAGNOSIS — I2121 ST elevation (STEMI) myocardial infarction involving left circumflex coronary artery: Secondary | ICD-10-CM | POA: Diagnosis not present

## 2016-12-17 DIAGNOSIS — Z880 Allergy status to penicillin: Secondary | ICD-10-CM | POA: Diagnosis not present

## 2016-12-17 DIAGNOSIS — I249 Acute ischemic heart disease, unspecified: Secondary | ICD-10-CM | POA: Insufficient documentation

## 2016-12-17 DIAGNOSIS — I213 ST elevation (STEMI) myocardial infarction of unspecified site: Secondary | ICD-10-CM

## 2016-12-17 DIAGNOSIS — Z7901 Long term (current) use of anticoagulants: Secondary | ICD-10-CM | POA: Diagnosis not present

## 2016-12-17 DIAGNOSIS — Z955 Presence of coronary angioplasty implant and graft: Secondary | ICD-10-CM | POA: Diagnosis not present

## 2016-12-17 DIAGNOSIS — R072 Precordial pain: Secondary | ICD-10-CM | POA: Diagnosis not present

## 2016-12-17 DIAGNOSIS — Z9842 Cataract extraction status, left eye: Secondary | ICD-10-CM | POA: Diagnosis not present

## 2016-12-17 DIAGNOSIS — I2510 Atherosclerotic heart disease of native coronary artery without angina pectoris: Secondary | ICD-10-CM

## 2016-12-17 DIAGNOSIS — Z79899 Other long term (current) drug therapy: Secondary | ICD-10-CM | POA: Diagnosis not present

## 2016-12-17 LAB — BASIC METABOLIC PANEL
Anion gap: 8 (ref 5–15)
BUN: 17 mg/dL (ref 6–20)
CHLORIDE: 106 mmol/L (ref 101–111)
CO2: 23 mmol/L (ref 22–32)
Calcium: 8.9 mg/dL (ref 8.9–10.3)
Creatinine, Ser: 0.93 mg/dL (ref 0.61–1.24)
GFR calc Af Amer: >60 mL/min (ref >60)
GFR calc non Af Amer: >60 mL/min (ref >60)
Glucose, Bld: 97 mg/dL (ref 65–99)
Potassium: 3.7 mmol/L (ref 3.5–5.1)
SODIUM: 137 mmol/L (ref 135–145)

## 2016-12-17 LAB — PROTIME-INR
INR: 1.04
PROTHROMBIN TIME: 13.6 s (ref 11.4–15.2)

## 2016-12-17 LAB — TROPONIN I: Troponin I: 0.06 ng/mL (ref <0.03)

## 2016-12-17 LAB — MRSA PCR SCREENING: MRSA by PCR: NEGATIVE

## 2016-12-17 LAB — HEPARIN LEVEL (UNFRACTIONATED)
HEPARIN UNFRACTIONATED: 0.35 [IU]/mL (ref 0.30–0.70)
HEPARIN UNFRACTIONATED: 0.45 [IU]/mL (ref 0.30–0.70)

## 2016-12-17 MED ORDER — METHYLPREDNISOLONE SODIUM SUCC 125 MG IJ SOLR
125.0000 mg | INTRAMUSCULAR | Status: AC
  Administered 2016-12-17: 125 mg via INTRAVENOUS
  Filled 2016-12-17: qty 2

## 2016-12-17 MED ORDER — NITROGLYCERIN 0.4 MG SL SUBL: 0.4000 mg | SUBLINGUAL_TABLET | SUBLINGUAL | Status: DC | PRN

## 2016-12-17 MED ORDER — LEVOTHYROXINE SODIUM 88 MCG PO TABS
88.0000 ug | ORAL_TABLET | Freq: Every day | ORAL | Status: DC
  Administered 2016-12-17: 88 ug via ORAL
  Filled 2016-12-17: qty 1

## 2016-12-17 MED ORDER — DIPHENHYDRAMINE HCL 50 MG/ML IJ SOLN
25.0000 mg | Freq: Once | INTRAMUSCULAR | Status: AC
Start: 2016-12-17 — End: 2016-12-17
  Administered 2016-12-17: 25 mg via INTRAVENOUS
  Filled 2016-12-17: qty 1

## 2016-12-17 MED ORDER — ACETAMINOPHEN 325 MG PO TABS: 325.0000 mg | ORAL_TABLET | Freq: Four times a day (QID) | ORAL | Status: DC | PRN

## 2016-12-17 MED ORDER — ONDANSETRON HCL 4 MG/2ML IJ SOLN: 4.0000 mg | Freq: Four times a day (QID) | INTRAMUSCULAR | Status: DC | PRN

## 2016-12-17 MED ORDER — ASPIRIN 81 MG PO TBEC: 81.0000 mg | DELAYED_RELEASE_TABLET | Freq: Every day | ORAL | Status: DC

## 2016-12-17 MED ORDER — TICAGRELOR 90 MG PO TABS
90.0000 mg | ORAL_TABLET | Freq: Two times a day (BID) | ORAL | Status: DC
  Administered 2016-12-17: 90 mg via ORAL
  Filled 2016-12-17: qty 1

## 2016-12-17 MED ORDER — ATORVASTATIN CALCIUM 80 MG PO TABS: 80.0000 mg | ORAL_TABLET | Freq: Every day | ORAL | Status: DC

## 2016-12-17 MED ORDER — ALBUTEROL SULFATE (2.5 MG/3ML) 0.083% IN NEBU: 3.0000 mL | INHALATION_SOLUTION | Freq: Four times a day (QID) | RESPIRATORY_TRACT | Status: DC | PRN

## 2016-12-17 MED ORDER — IOPAMIDOL (ISOVUE-370) INJECTION 76%
INTRAVENOUS | Status: AC
  Administered 2016-12-17: 100 mL via INTRAVENOUS
  Filled 2016-12-17: qty 100

## 2016-12-17 MED ORDER — ASPIRIN EC 81 MG PO TBEC
81.0000 mg | DELAYED_RELEASE_TABLET | Freq: Every day | ORAL | Status: DC
Start: 2016-12-17 — End: 2016-12-17
  Administered 2016-12-17: 81 mg via ORAL
  Filled 2016-12-17: qty 1

## 2016-12-17 MED ORDER — TAMSULOSIN HCL 0.4 MG PO CAPS
0.4000 mg | ORAL_CAPSULE | Freq: Every day | ORAL | Status: DC
  Administered 2016-12-17: 0.4 mg via ORAL
  Filled 2016-12-17: qty 1

## 2016-12-17 MED ORDER — ACETAMINOPHEN 325 MG PO TABS
650.0000 mg | ORAL_TABLET | ORAL | Status: DC | PRN
  Administered 2016-12-17: 650 mg via ORAL
  Filled 2016-12-17: qty 2

## 2016-12-17 MED ORDER — PANTOPRAZOLE SODIUM 20 MG PO TBEC
20.0000 mg | DELAYED_RELEASE_TABLET | Freq: Every day | ORAL | Status: DC
  Administered 2016-12-17: 20 mg via ORAL
  Filled 2016-12-17: qty 1

## 2016-12-17 MED ORDER — ALPRAZOLAM 0.5 MG PO TABS
0.5000 mg | ORAL_TABLET | ORAL | Status: AC
  Administered 2016-12-17: 0.5 mg via ORAL
  Filled 2016-12-17: qty 1

## 2016-12-17 NOTE — Progress Notes (Signed)
Dr. Acie Fredrickson and Dr. Kathlene Cote agreed patient could have 125mg  solumedrol IV and 50mg  benadryl. The patient would be scanned 2 hours post receiving solumedrol. CTA aorta scan ?dissection.

## 2016-12-17 NOTE — Discharge Summary (Signed)
Discharge Summary    Patient ID: Ryan [name],  MRN: [id number], DOB/AGE: [birth year] 71 y.o.  Admit date: 12/16/2016 Discharge date: 12/17/2016  Primary Care Provider: Stevie Kern A Primary Cardiologist: Jerilynn Mages. Burt Knack, MD   Discharge Diagnoses    Principal Problem:   Midsternal chest pain Active Problems:   ST elevation myocardial infarction (STEMI), subsequent episode of care [facility])  **s/p recent DES to LCX.   CAD (coronary artery disease)   Hypothyroidism   Hypercholesterolemia  Allergies Allergies  Allergen Reactions  . Contrast Media [Iodinated Diagnostic Agents] Shortness Of Breath and Swelling    Swelling of the throat   . Erythromycin Nausea And Vomiting  . Niacin And Related Swelling  . Penicillins Hives    Has patient had a PCN reaction causing immediate rash, facial/tongue/throat swelling, SOB or lightheadedness with hypotension: Y Has patient had a PCN reaction causing severe rash involving mucus membranes or skin necrosis: No Has patient had a PCN reaction that required hospitalization: No Has patient had a PCN reaction occurring within the last 10 years: No If all of the above answers are "NO", then may proceed with Cephalosporin use.    Diagnostic Studies/Procedures    None _____________   History of Present Illness     71 y/o ? with a recent h/o inferoposterior STEMI and LCX stenting on 12/08/2016.  He was subsequently discharged on 5/8 and initially did well but on 5/14, he developed chest and upper back/left scapular pain while eating lunch. This persisted despite sl ntg and so he presented to the [facility] ED for evaluation.  There, ECG was non-acute.  Point of care troponin was mildly elevated.  He was transferred to [facility] for further eval.  Hospital Course     Consultants: None   Patient had no further chest pain overnight and his troponin continued to trend down from prior event.  We obtained CTA of the chest to r/o dissection and this was negative.   He has since been ambulating without difficulty and will be discharged home today in good condition with plan for oupatient follow-up next week. _____________  Discharge Vitals Blood pressure 121/69, pulse 65, temperature 97.9 F (36.6 C), temperature source Oral, resp. rate 16, height 6' (1.829 m), weight 214 lb 11.2 oz (97.4 kg), SpO2 97 %.  Filed Weights   12/16/16 1547 12/17/16 0000 12/17/16 0615  Weight: 216 lb (98 kg) 214 lb 8 oz (97.3 kg) 214 lb 11.2 oz (97.4 kg)    Labs & Radiologic Studies    CBC  Recent Labs  12/16/16 1657  WBC 10.5  HGB 14.0  HCT 41.1  MCV 92.2  PLT 947   Basic Metabolic Panel  Recent Labs  12/16/16 1657 12/17/16 0545  NA 136 137  K 3.9 3.7  CL 104 106  CO2 25 23  GLUCOSE 119* 97  BUN 23* 17  CREATININE 1.09 0.93  CALCIUM 9.2 8.9   Cardiac Enzymes  Recent Labs  12/17/16 0545  TROPONINI 0.06*  _____________  Dg Chest 2 View  Result Date: 12/16/2016 CLINICAL DATA:  Initial evaluation for acute chest pain. EXAM: CHEST  2 VIEW COMPARISON:  Prior radiograph from 12/08/2016. FINDINGS: The cardiac and mediastinal silhouettes are stable in size and contour, and remain within normal limits. The lungs are normally inflated. Mild left basilar subsegmental atelectasis. No airspace consolidation, pleural effusion, or pulmonary edema is identified. There is no pneumothorax. No acute osseous abnormality identified. IMPRESSION: Mild left basilar subsegmental atelectasis. No  other active cardiopulmonary disease. Electronically Signed   By: Jeannine Boga M.D.   On: 12/16/2016 17:29   Ct Angio Chest/abd/pel For Dissection W And/or W/wo  Result Date: 12/17/2016 CLINICAL DATA:  71 year old male with chest pain. Recent MI and coronary intervention. EXAM: CT ANGIOGRAPHY CHEST, ABDOMEN AND PELVIS IMPRESSION: 1. No aortic aneurysm or dissection. Mostly infrarenal aortic atherosclerosis including some calcified plaque. Central pulmonary arteries are  patent. Calcified coronary artery atherosclerosis. 2. No acute or inflammatory process identified in the chest, abdomen, or pelvis. Possible hepatic steatosis. Electronically Signed   By: Genevie Ann M.D.   On: 12/17/2016 13:31   Disposition   Pt is being discharged home today in good condition.  Follow-up Plans & Appointments    Follow-up Information    [place], Crista Luria, [place] Follow up on 12/24/2016.   Specialty:  Cardiology Why:  11:00 AM (we rescheduled your 5/16 appt to this date). Contact information: [address] [address] [phone]           Discharge Medications   Current Discharge Medication List    CONTINUE these medications which have NOT CHANGED   Details  acetaminophen (TYLENOL) 325 MG tablet Take 325-650 mg by mouth every 6 (six) hours as needed for mild pain or moderate pain.    albuterol (PROVENTIL HFA;VENTOLIN HFA) 108 (90 Base) MCG/ACT inhaler Inhale 2 puffs into the lungs every 6 (six) hours as needed for wheezing. Qty: 1 Inhaler, Refills: 1    aspirin EC 81 MG EC tablet Take 1 tablet (81 mg total) by mouth daily. Qty: 30 tablet, Refills: 11    atorvastatin (LIPITOR) 80 MG tablet Take 1 tablet (80 mg total) by mouth daily at 6 PM. Qty: 30 tablet, Refills: 6    FISH OIL-KRILL OIL PO Take 1 tablet by mouth daily.    Multiple Vitamin (MULTIVITAMIN WITH MINERALS) TABS tablet Take 1 tablet by mouth daily.    nitroGLYCERIN (NITROSTAT) 0.4 MG SL tablet Place 1 tablet (0.4 mg total) under the tongue every 5 (five) minutes x 3 doses as needed for chest pain. Qty: 12 tablet, Refills: 12    pantoprazole (PROTONIX) 20 MG tablet Take 1 tablet (20 mg total) by mouth daily. Qty: 30 tablet, Refills: 6    SYNTHROID 88 MCG tablet Take 1 tablet (88 mcg total) by mouth daily. Qty: 100 tablet, Refills: 4   Associated Diagnoses: Hypothyroidism, unspecified type; Need for prophylactic vaccination and inoculation against influenza; Hypothyroidism  due to non-medication exogenous substances; Inflamed seborrheic keratosis; Other specified hypothyroidism; Other seasonal allergic rhinitis    tamsulosin (FLOMAX) 0.4 MG CAPS capsule Take 1 capsule (0.4 mg total) by mouth daily. Qty: 100 capsule, Refills: 4    ticagrelor (BRILINTA) 90 MG TABS tablet Take 1 tablet (90 mg total) by mouth 2 (two) times daily. Qty: 60 tablet, Refills: 3      STOP taking these medications     HYDROcodone-homatropine (HYCODAN) 5-1.5 MG/5ML syrup          Outstanding Labs/Studies   None  Duration of Discharge Encounter   Greater than 30 minutes including physician time.  Signed, Murray Hodgkins NP 12/17/2016, 2:16 PM   Attending Note:   The patient was seen and examined.  Agree with assessment and plan as noted above.  Changes made to the above note as needed.  Patient seen and independently examined with Ignacia Bayley, NP .   We discussed all aspects of the encounter. I agree with the assessment  and plan as stated above.  1. Chest pain :   Troponin trend is downward.  Most c/w the expected gradual decline from his peak last week CT angio was negative for aortic dissecton OK for discharge   I have spent a total of 40 minutes with patient reviewing hospital  notes , telemetry, EKGs, labs and examining patient as well as establishing an assessment and plan that was discussed with the patient. > 50% of time was spent in direct patient care.    Thayer Headings, Brooke Bonito., MD, [facility] 12/17/2016, 2:40 PM [address],  [place] Pager [phone]

## 2016-12-17 NOTE — H&P (Signed)
.   History & Physical    Patient ID: Ryan [name] MRN: [id number], DOB/AGE: 72-21-1947   Admit date: 12/16/2016   Primary Physician: Dorena Cookey, MD Primary Cardiologist: Sherren Mocha MD   Past Medical History    Past Medical History:  Diagnosis Date  . Allergy   . Anxiety   . Arthritis   . Asthma   . Cataract   . Hypothyroidism     Past Surgical History:  Procedure Laterality Date  . cataract Bilateral 03/2016  . COLONOSCOPY  10.27.2005  . LEFT HEART CATH AND CORONARY ANGIOGRAPHY N/A 12/08/2016   Procedure: Left Heart Cath and Coronary Angiography;  Surgeon: Sherren Mocha, MD;  Location: [place] [facility];  Service: Cardiovascular;  Laterality: N/A;  . SHOULDER SURGERY Right   . TONSILLECTOMY       Allergies  Allergies  Allergen Reactions  . Contrast Media [Iodinated Diagnostic Agents] Shortness Of Breath and Swelling    Swelling of the throat   . Erythromycin Nausea And Vomiting  . Niacin And Related Swelling  . Penicillins Hives    Has patient had a PCN reaction causing immediate rash, facial/tongue/throat swelling, SOB or lightheadedness with hypotension: Y Has patient had a PCN reaction causing severe rash involving mucus membranes or skin necrosis: No Has patient had a PCN reaction that required hospitalization: No Has patient had a PCN reaction occurring within the last 10 years: No If all of the above answers are "NO", then may proceed with Cephalosporin use.    History of Present Illness    Ryan [name] is a  71 y/oM with PMH of STEMI last week s/p PCI  To LCX on DAPT with aspirin and ticagrelor who presents to [facility] ED on 12/16/2012 with chest pain. CP started after a meal. Radiating to shoulder. Scale 6/10. Nothing alleviated the pain. He has not missed any meds. He has started to walk and felt ok with that. BP and HR were stable at home. No new stressors.   Initial troponin 0.09. Started on Heparin and nitro drip and transferred to [facility].     Home Medications    Prior to Admission medications   Medication Sig Start Date End Date Taking? Authorizing Provider  acetaminophen (TYLENOL) 325 MG tablet Take 325-650 mg by mouth every 6 (six) hours as needed for mild pain or moderate pain.   Yes [provider]  albuterol (PROVENTIL HFA;VENTOLIN HFA) 108 (90 Base) MCG/ACT inhaler Inhale 2 puffs into the lungs every 6 (six) hours as needed for wheezing. 08/28/16  Yes Dorena Cookey, MD  aspirin EC 81 MG EC tablet Take 1 tablet (81 mg total) by mouth daily. 12/11/16  Yes Carlota Raspberry, Tiffany, PA-C  atorvastatin (LIPITOR) 80 MG tablet Take 1 tablet (80 mg total) by mouth daily at 6 PM. 12/10/16  Yes Carlota Raspberry, Tiffany, PA-C  FISH OIL-KRILL OIL PO Take 1 tablet by mouth daily.   Yes [provider]  Multiple Vitamin (MULTIVITAMIN WITH MINERALS) TABS tablet Take 1 tablet by mouth daily.   Yes [provider]  nitroGLYCERIN (NITROSTAT) 0.4 MG SL tablet Place 1 tablet (0.4 mg total) under the tongue every 5 (five) minutes x 3 doses as needed for chest pain. 12/10/16  Yes Carlota Raspberry, Tiffany, PA-C  pantoprazole (PROTONIX) 20 MG tablet Take 1 tablet (20 mg total) by mouth daily. 12/10/16  Yes Greene, Tiffany, PA-C  SYNTHROID 88 MCG tablet Take 1 tablet (88 mcg total) by mouth daily. 08/28/16  Yes Stevie Kern  A, MD  tamsulosin (FLOMAX) 0.4 MG CAPS capsule Take 1 capsule (0.4 mg total) by mouth daily. 08/28/16  Yes Dorena Cookey, MD  ticagrelor (BRILINTA) 90 MG TABS tablet Take 1 tablet (90 mg total) by mouth 2 (two) times daily. 12/10/16  Yes Carlota Raspberry, Tiffany, PA-C  HYDROcodone-homatropine (HYCODAN) 5-1.5 MG/5ML syrup Take 5 mLs by mouth every 8 (eight) hours as needed. Patient not taking: Reported on 12/09/2016 06/05/15   Dorena Cookey, MD  ticagrelor (BRILINTA) 90 MG TABS tablet Take 1 tablet (90 mg total) by mouth 2 (two) times daily. Patient not taking: Reported on 12/16/2016 12/10/16   Delos Haring, PA-C    Family History    Family  History  Problem Relation Age of Onset  . Diabetes Mother   . Heart failure Mother   . Irritable bowel syndrome Mother   . Diverticulosis Mother   . Lung cancer Father   . Heart disease Sister   . Irritable bowel syndrome Sister   . Diverticulosis Sister     Social History    Social History   Social History  . Marital status: Married    Spouse name: N/A  . Number of children: 2  . Years of education: N/A   Occupational History  . retired    Social History Main Topics  . Smoking status: Never Smoker  . Smokeless tobacco: Never Used  . Alcohol use Yes     Comment: less than 1 per day  . Drug use: No  . Sexual activity: Not on file   Other Topics Concern  . Not on file   Social History Narrative  . No narrative on file     Review of Systems    General:  No chills, fever, night sweats or weight changes.  Cardiovascular:  No chest pain, dyspnea on exertion, edema, orthopnea, palpitations, paroxysmal nocturnal dyspnea. Dermatological: No rash, lesions/masses Respiratory: No cough, dyspnea Urologic: No hematuria, dysuria Abdominal:   No nausea, vomiting, diarrhea, bright red blood per rectum, melena, or hematemesis Neurologic:  No visual changes, wkns, changes in mental status. All other systems reviewed and are otherwise negative except as noted above.  Physical Exam    Blood pressure 136/81, pulse (!) 55, temperature (!) 96.9 F (36.1 C), temperature source Oral, resp. rate 16, height 6' (1.829 m), weight 97.3 kg (214 lb 8 oz), SpO2 96 %.  General: Pleasant, NAD Psych: Normal affect. Neuro: Alert and oriented X 3. Moves all extremities spontaneously. HEENT: Normal  Neck: Supple without bruits or JVD. Lungs:  Resp regular and unlabored, CTA. Heart: RRR no s3, s4, or murmurs.mSome pain with compression of chest above pain point Abdomen: Soft, non-tender, non-distended, BS + x 4.  Extremities: No clubbing, cyanosis or edema. DP/PT/Radials 2+ and equal  bilaterally.  Labs    Troponin (Point of Care Test)  Recent Labs  12/16/16 1706  TROPIPOC 0.09*   No results for input(s): CKTOTAL, CKMB, TROPONINI in the last 72 hours. Lab Results  Component Value Date   WBC 10.5 12/16/2016   HGB 14.0 12/16/2016   HCT 41.1 12/16/2016   MCV 92.2 12/16/2016   PLT 291 12/16/2016    Recent Labs Lab 12/16/16 1657  NA 136  K 3.9  CL 104  CO2 25  BUN 23*  CREATININE 1.09  CALCIUM 9.2  GLUCOSE 119*   Lab Results  Component Value Date   CHOL 206 (H) 12/08/2016   HDL 44 12/08/2016   LDLCALC 144 (H) 12/08/2016  TRIG 92 12/08/2016   No results found for: [facility]   Radiology Studies    Dg Chest 2 View  Result Date: 12/16/2016 CLINICAL DATA:  Initial evaluation for acute chest pain. EXAM: CHEST  2 VIEW COMPARISON:  Prior radiograph from 12/08/2016. FINDINGS: The cardiac and mediastinal silhouettes are stable in size and contour, and remain within normal limits. The lungs are normally inflated. Mild left basilar subsegmental atelectasis. No airspace consolidation, pleural effusion, or pulmonary edema is identified. There is no pneumothorax. No acute osseous abnormality identified. IMPRESSION: Mild left basilar subsegmental atelectasis. No other active cardiopulmonary disease. Electronically Signed   By: Jeannine Boga M.D.   On: 12/16/2016 17:29   Dg Chest 2 View  Result Date: 12/08/2016 CLINICAL DATA:  Chest pain EXAM: CHEST  2 VIEW COMPARISON:  11/03/2009 FINDINGS: Normal heart size and stable mediastinal contours. Mild scar-like opacity at the left base, similar to prior. There is no edema, consolidation, effusion, or pneumothorax. IMPRESSION: No evidence of active disease. Electronically Signed   By: Monte Fantasia M.D.   On: 12/08/2016 20:22    ECG & Cardiac Imaging   sinu brady, no acute ischemic changes  Assessment & Plan    Ryan [name] had a STEMI last week. Now with some anginal pain. Different in nature from STEMI related  pain. Somewhat atypical. No ischemic changes on ECG. Trop is much lower than d/c top of above 1.   Plan: - Will keep on hep - trend trop - Will titrate nitro as needed.  - Would benefit from po anginal meds if trop is downtrending and this turns out to be NOT a NSTEMI but rather post PCI angina  Signed, Cristina Gong, MD 12/17/2016, 1:31 AM

## 2016-12-17 NOTE — Plan of Care (Signed)
Problem: Education: Goal: Knowledge of [facility] Education information/materials will improve Outcome: Progressing Patient educated/oriented to floor.   Problem: Pain Managment: Goal: General experience of comfort will improve Outcome: Progressing Tylenol given for headache. Nitro drip stopped per cardiology.

## 2016-12-17 NOTE — Discharge Instructions (Signed)
***  PLEASE REMEMBER TO BRING ALL OF YOUR MEDICATIONS TO EACH OF YOUR FOLLOW-UP OFFICE VISITS.  

## 2016-12-17 NOTE — Progress Notes (Signed)
Progress Note  Patient Name: Ryan [name] Date of Encounter: 12/17/2016  Primary Cardiologist: Jerilynn Mages. Burt Knack, MD   Subjective   He continued to have mild left scapular discomfort until early this am.  It recurred when he sat up in bed.  He now thinks that maybe it's MSK.  Symptoms yesterday initially felt like indigestion - occurred while eating.  Troponin has trended down.  Inpatient Medications    Scheduled Meds: . aspirin EC  81 mg Oral Daily  . atorvastatin  80 mg Oral q1800  . levothyroxine  88 mcg Oral QAC breakfast  . pantoprazole  20 mg Oral Daily  . tamsulosin  0.4 mg Oral Daily  . ticagrelor  90 mg Oral BID   Continuous Infusions: . heparin 1,250 Units/hr (12/16/16 1849)   PRN Meds: acetaminophen, albuterol, nitroGLYCERIN, ondansetron (ZOFRAN) IV   Vital Signs    Vitals:   12/17/16 0000 12/17/16 0015 12/17/16 0453 12/17/16 0615  BP:  136/81 105/77   Pulse:   (!) 57   Resp:  16    Temp:   97.7 F (36.5 C)   TempSrc:   Oral   SpO2:  96% 97%   Weight: 214 lb 8 oz (97.3 kg)   214 lb 11.2 oz (97.4 kg)  Height: 6' (1.829 m)       Intake/Output Summary (Last 24 hours) at 12/17/16 0813 Last data filed at 12/17/16 0600  Gross per 24 hour  Intake           160.42 ml  Output              600 ml  Net          -439.58 ml   Filed Weights   12/16/16 1547 12/17/16 0000 12/17/16 0615  Weight: 216 lb (98 kg) 214 lb 8 oz (97.3 kg) 214 lb 11.2 oz (97.4 kg)    Physical Exam   GEN: Well nourished, well developed, in no acute distress.  HEENT: Grossly normal.  Neck: Supple, no JVD, carotid bruits, or masses. Cardiac: RRR, no murmurs, rubs, or gallops. No clubbing, cyanosis, edema.  Radials/DP/PT 2+ and equal bilaterally. R wrist cath site w/o bleeding/bruit/hematoma. Respiratory:  Respirations regular and unlabored, clear to auscultation bilaterally. GI: Soft, nontender, nondistended, BS + x 4. MS: no deformity or atrophy. Skin: warm and dry, no rash. Neuro:   Strength and sensation are intact. Psych: AAOx3.  Normal affect.  Labs    Chemistry Recent Labs Lab 12/16/16 1657 12/17/16 0545  NA 136 137  K 3.9 3.7  CL 104 106  CO2 25 23  GLUCOSE 119* 97  BUN 23* 17  CREATININE 1.09 0.93  CALCIUM 9.2 8.9  GFRNONAA >60 >60  GFRAA >60 >60  ANIONGAP 7 8     Hematology Recent Labs Lab 12/16/16 1657  WBC 10.5  RBC 4.46  HGB 14.0  HCT 41.1  MCV 92.2  MCH 31.4  MCHC 34.1  RDW 12.5  PLT 291    Cardiac Enzymes Recent Labs Lab 12/17/16 0545  TROPONINI 0.06*    Recent Labs Lab 12/16/16 1706  TROPIPOC 0.09*     Radiology    Dg Chest 2 View  Result Date: 12/16/2016 CLINICAL DATA:  Initial evaluation for acute chest pain. EXAM: CHEST  2 VIEW COMPARISON:  Prior radiograph from 12/08/2016. FINDINGS: The cardiac and mediastinal silhouettes are stable in size and contour, and remain within normal limits. The lungs are normally inflated. Mild left basilar subsegmental atelectasis. No  airspace consolidation, pleural effusion, or pulmonary edema is identified. There is no pneumothorax. No acute osseous abnormality identified. IMPRESSION: Mild left basilar subsegmental atelectasis. No other active cardiopulmonary disease. Electronically Signed   By: Jeannine Boga M.D.   On: 12/16/2016 17:29   Telemetry    RSR - Personally Reviewed  ECG    5/14 - RSR, 64, inferior infarct, no acute changes - Personally Reviewed 5/15 - RSR, arm lead reversal.  Cardiac Studies   Cardiac Catheterization and Percutaneous Coronary Intervention 5.6.2018  Coronary Findings   Dominance: Right  Left Main  Vessel is angiographically normal.  Left Anterior Descending  Vessel is small.  Prox LAD to Mid LAD lesion, 50% stenosed.  Ramus Intermedius  Vessel is small.  Ramus lesion, 80% stenosed.  Left Circumflex  Prox Cx lesion, 95% stenosed. The lesion is eccentric.  Angioplasty: Lesion crossed with guidewire using a WIRE COUGAR XT ST. 190CM.  Pre-stent angioplasty was performed using a BALLOON MOZEC 2.50X14. A STENT PROMUS PREM MR 3.5X24 drug eluting stent was successfully placed. Post-stent angioplasty was not performed. The pre-interventional distal flow is normal (TIMI 3). The post-interventional distal flow is normal (TIMI 3). The intervention was successful . No complications occurred at this lesion. An EBU guide is used. Heparin and aggrastat are used for anticoagulation. A therapeutic ACT is achieved. The lesion is dilated with a 2.5 mm balloon, then stented with a 3.5x24 mm Promus DES.  There is no residual stenosis post intervention.  Right Coronary Artery  Mid RCA lesion, 50% stenosed.  Dist RCA lesion, 40% stenosed.   Left Heart   Left Ventricle The left ventricular size is normal. The left ventricular systolic function is normal. LV end diastolic pressure is normal. The left ventricular ejection fraction is 55-65% by visual estimate. No regional wall motion abnormalities.     Patient Profile     71 y.o. male s/p inferoposterior stemi on 5/6 w/ DES  LCX, otw nonobs dzs and nl EF, who presented to the [facility] ED on 5/14 with c/p radiating to his shoulder and down-trending troponins.  Assessment & Plan    1.  STEMI, subsequent episode of care/CAD/midsternal chest and left scapular pain:  Pt s/p recent inferoposterior STEMI on 5/6 s/p PCI/DES to the LCX.  Otw nonobs dzs and nl EF.  He has been compliant with his meds @ home.  Developed c/p and left scapular pain while eating lunch yesterday.  This was not initially nitrate responsive.  Pain persisted several hrs prior to presenting to ED.  There ECG non-acute.  Initial POC trop 0.09.  F/u trop this AM  0.06.  Chest and back pain completely resolved earlier this am.  He did have some recurrence of scapular pain when sitting up this AM.  With somewhat atypical Ss and downtrending troponin, this does not appear to represent ACS.  I will d/c heparin.  F/u ecg this am.  Ambulate.  Cont  asa, statin, brilinta. Not prev on  blocker due to baseline bradycardia.  BPs also soft.  Can consider low dose long-acting nitrate, though may not tolerate.  Possible d/c later today.  2.  HL:  LDL 144 on 5/6.  Cont high potency statin.  3.  Hypothyroidism:  TSH very mildly elevated in January.  Cont synthroid. F/u as outpt.  Signed, Murray Hodgkins, NP  12/17/2016, 8:13 AM    Attending Note:   The patient was seen and examined.  Agree with assessment and plan as noted above.  Changes  made to the above note as needed.  Patient seen and independently examined with Ignacia Bayley, NP.   We discussed all aspects of the encounter. I agree with the assessment and plan as stated above.  1.   CAD,  troponins are minimally elevated but are much lower than they were last week and continue to trend downward. No evidence of ACS .  Pain is better .  It was different from his presenting pain last week Seemed to go through to his back  Will get a CT angio to rule out aortic dissection.  Anticipate DC later today  Encouraged him to ambulate   I have spent a total of 40 minutes with patient reviewing hospital  notes , telemetry, EKGs, labs and examining patient as well as establishing an assessment and plan that was discussed with the patient. > 50% of time was spent in direct patient care.    Thayer Headings, Brooke Bonito., MD, [facility] 12/17/2016, 10:06 AM [address]  [place] Pager [phone]

## 2016-12-18 ENCOUNTER — Ambulatory Visit: Payer: Medicare Other | Admitting: Physician Assistant

## 2016-12-21 NOTE — Progress Notes (Signed)
Cardiology Office Note    Date:  12/24/2016   ID:  Ryan [name], DOB 19-Jul-1946, MRN [id number]  PCP:  Dorena Cookey, MD  Cardiologist: Dr. Burt Knack  Chief Complaint: Hospital follow up for chest pain   History of Present Illness:   Ryan [name] is a 71 y.o. male with hx of CAD presents for hospital follow up.   Hx of recent  inferoposterior STEMI and LCX stenting on 12/08/2016.  He was subsequently discharged on 5/8 and initially did well but on 5/14, he developed chest and upper back/left scapular pain while eating lunch. This persisted despite sl ntg and so he presented to the [facility] ED for evaluation.  There, ECG was non-acute. Admitted to [facility] for observation. No overnight chest pain. Troponin trending down from prior event. No dissection on CTA of chest. Ambulated without chest pain and discharged.   Here today for follow up. No episode of chest pain/indigestion since discharge 5/15. He is walking 5 minutes at a time without any dyspnea or chest pain. This morning he had a indigestion/chest pain around 5:00 that woke him up. Resolved in less than 1 minute with sitting up. Hx of hiatal hernia. He takes Protonix 20mg  qd.   Past Medical History:  Diagnosis Date  . Allergy   . Anxiety   . Arthritis   . Asthma   . Cataract   . Hypothyroidism     Past Surgical History:  Procedure Laterality Date  . cataract Bilateral 03/2016  . COLONOSCOPY  10.27.2005  . LEFT HEART CATH AND CORONARY ANGIOGRAPHY N/A 12/08/2016   Procedure: Left Heart Cath and Coronary Angiography;  Surgeon: Sherren Mocha, MD;  Location: [place] [facility];  Service: Cardiovascular;  Laterality: N/A;  . SHOULDER SURGERY Right   . TONSILLECTOMY      Current Medications: Prior to Admission medications   Medication Sig Start Date End Date Taking? Authorizing Provider  acetaminophen (TYLENOL) 325 MG tablet Take 325-650 mg by mouth every 6 (six) hours as needed for mild pain or moderate pain.    [provider]  albuterol (PROVENTIL HFA;VENTOLIN HFA) 108 (90 Base) MCG/ACT inhaler Inhale 2 puffs into the lungs every 6 (six) hours as needed for wheezing. 08/28/16   Dorena Cookey, MD  aspirin EC 81 MG EC tablet Take 1 tablet (81 mg total) by mouth daily. 12/11/16   Delos Haring, PA-C  atorvastatin (LIPITOR) 80 MG tablet Take 1 tablet (80 mg total) by mouth daily at 6 PM. 12/10/16   Carlota Raspberry, Tiffany, PA-C  FISH OIL-KRILL OIL PO Take 1 tablet by mouth daily.    [provider]  Multiple Vitamin (MULTIVITAMIN WITH MINERALS) TABS tablet Take 1 tablet by mouth daily.    [provider]  nitroGLYCERIN (NITROSTAT) 0.4 MG SL tablet Place 1 tablet (0.4 mg total) under the tongue every 5 (five) minutes x 3 doses as needed for chest pain. 12/10/16   Delos Haring, PA-C  pantoprazole (PROTONIX) 20 MG tablet Take 1 tablet (20 mg total) by mouth daily. 12/10/16   Carlota Raspberry, Tiffany, PA-C  SYNTHROID 88 MCG tablet Take 1 tablet (88 mcg total) by mouth daily. 08/28/16   Dorena Cookey, MD  tamsulosin (FLOMAX) 0.4 MG CAPS capsule Take 1 capsule (0.4 mg total) by mouth daily. 08/28/16   Dorena Cookey, MD  ticagrelor (BRILINTA) 90 MG TABS tablet Take 1 tablet (90 mg total) by mouth 2 (two) times daily. 12/10/16   Delos Haring, PA-C  Allergies:   Contrast media [iodinated diagnostic agents]; Erythromycin; Niacin and related; and Penicillins   Social History   Social History  . Marital status: Married    Spouse name: N/A  . Number of children: 2  . Years of education: N/A   Occupational History  . retired    Social History Main Topics  . Smoking status: Never Smoker  . Smokeless tobacco: Never Used  . Alcohol use Yes     Comment: less than 1 per day  . Drug use: No  . Sexual activity: Not Asked   Other Topics Concern  . None   Social History Narrative  . None     Family History:  The patient's family history includes Diabetes in his mother; Diverticulosis in his mother and  sister; Heart disease in his sister; Heart failure in his mother; Irritable bowel syndrome in his mother and sister; Lung cancer in his father.   ROS:   Please see the history of present illness.    ROS All other systems reviewed and are negative.   PHYSICAL EXAM:   VS:  BP 116/88   Pulse 61   Ht 6' (1.829 m)   Wt 216 lb 12.8 oz (98.3 kg)   BMI 29.40 kg/m    GEN: Well nourished, well developed, in no acute distress  HEENT: normal  Neck: no JVD, carotid bruits, or masses Cardiac: RRR; no murmurs, rubs, or gallops,no edema  Respiratory:  clear to auscultation bilaterally, normal work of breathing GI: soft, nontender, nondistended, + BS MS: no deformity or atrophy  Skin: warm and dry, no rash Neuro:  Alert and Oriented x 3, Strength and sensation are intact Psych: euthymic mood, full affect  Wt Readings from Last 3 Encounters:  12/24/16 216 lb 12.8 oz (98.3 kg)  12/23/16 216 lb (98 kg)  12/17/16 214 lb 11.2 oz (97.4 kg)      Studies/Labs Reviewed:   EKG:  EKG is ordered today.  The ekg ordered today demonstrates NSR  Recent Labs: 08/28/2016: ALT 22; TSH 4.63 12/16/2016: Hemoglobin 14.0; Platelets 291 12/17/2016: BUN 17; Creatinine, Ser 0.93; Potassium 3.7; Sodium 137   Lipid Panel    Component Value Date/Time   CHOL 206 (H) 12/08/2016 2029   TRIG 92 12/08/2016 2029   HDL 44 12/08/2016 2029   CHOLHDL 4.7 12/08/2016 2029   VLDL 18 12/08/2016 2029   LDLCALC 144 (H) 12/08/2016 2029   LDLDIRECT 144.0 05/13/2013 1036    Additional studies/ records that were reviewed today include:   Left Heart Cath and Coronary Angiography   12/08/16  Conclusion   1. Severe thrombotic stenosis of the left circumflex treated successfully with Primary PCI using a 3.5x24 mm Promus DES 2. Moderate diffuse LAD, RCA, and ramus intermedius stenoses 3. Normal LV function  Recommend:  Aggressive medical therapy  The patient's residual CAD affects small caliber vessels and will be best  managed medically  Aggrastat x 6 hours  DAPT with ASA and brilinta x 12 months without interruption  Anticipate discharge 48 hours if no complications     ASSESSMENT & PLAN:    1. CAD with recent STEMI s/p DES to Lcx 12/08/16 - Moderate diffuse LAD, RCA and ramus intermediate stenosis--> medical management due to smaller caliber vessels. - Not on BB due to bradycardia. Episode of chest pain this morning could be due to GERD or CAD. Increase Protonix to 40mg  qd. Hx of hiatal hernia.  Reassuring EKG. If he required multiple SL nitro he  will give [place] a call. Will stat Imdur 30mg  qd at that time. He will enroll in Cardiac rehab phase II.  - Continue ASA, Brillinta and statin.   2. HLD - 12/08/2016: Cholesterol 206; HDL 44; LDL Cholesterol 144; Triglycerides 92; VLDL 18  - Continue statin. Lipid panel and LFT in 4 weeks.    Medication Adjustments/Labs and Tests Ordered: Current medicines are reviewed at length with the patient today.  Concerns regarding medicines are outlined above.  Medication changes, Labs and Tests ordered today are listed in the Patient Instructions below. Patient Instructions  Medication Instructions:  Your physician has recommended you make the following change in your medication:  1.  INCREASE the Protonix to 40 mg daily   Labwork: 4 WEEKS:  FASTING LIPID & LFT  Testing/Procedures: None ordered  Follow-Up: Your physician recommends that you schedule a follow-up appointment in: [place]  Any Other Special Instructions Will Be Listed Below (If Applicable).     If you need a refill on your cardiac medications before your next appointment, please call your pharmacy.      Jarrett Soho, [place]  12/24/2016 11:30 AM    [facility] [place] [place], [place], [place]  [address] Phone: [phone]; Fax: [phone]

## 2016-12-23 ENCOUNTER — Encounter: Payer: Self-pay | Admitting: Family Medicine

## 2016-12-23 ENCOUNTER — Ambulatory Visit (INDEPENDENT_AMBULATORY_CARE_PROVIDER_SITE_OTHER): Payer: Medicare Other | Admitting: Family Medicine

## 2016-12-23 VITALS — BP 100/64 | Temp 98.3°F | Wt 216.0 lb

## 2016-12-23 DIAGNOSIS — R0789 Other chest pain: Secondary | ICD-10-CM

## 2016-12-23 DIAGNOSIS — I2121 ST elevation (STEMI) myocardial infarction involving left circumflex coronary artery: Secondary | ICD-10-CM | POA: Diagnosis not present

## 2016-12-23 DIAGNOSIS — I2119 ST elevation (STEMI) myocardial infarction involving other coronary artery of inferior wall: Secondary | ICD-10-CM | POA: Diagnosis not present

## 2016-12-23 NOTE — Patient Instructions (Addendum)
Continue current medication  We'll get you set up a consult in GI to evaluate your swallowing.  Follow-up with cardiology as outlined  : September to set up your annual physical examination  the third week in January 2019

## 2016-12-23 NOTE — Progress Notes (Signed)
Ryan [name] is a delightful 71 year old married male nonsmoker who comes in today for follow-up having had ACS with angioplasty on 12/08/2016.  That day he played golf and had some substernal chest pain that lasted for 3 or 4 holes. When he stopped walking the pain went away. He then drove back to [facility] and subsequently went to the hospital later on that night. He first went to [place] was transferred to count.  Angiogram showed a 95% circumflex lesion with a 50% mid right 40% distal right an 80% LAD lesion. The circumflex lesion was angioplastied and stented. He tolerated the procedure no complications.  On May 14 2 back to the hospital because of severe chest pain unrelieved by nitroglycerin. At the time he was eating a cucumber.  He's had no cardiac risk factors in the past except his age and slight elevation of LDL.......... also positive family history on his mother's side of coronary artery disease  He's on 80 mg of Lipitor along with proton X and brelinta twice a day.  Asymptomatic  s BP 100/64 (BP Location: Left Arm)   Temp 98.3 F (36.8 C) (Oral)   Wt 216 lb (98 kg)   BMI 29.29 kg/m  General he is well-developed well-nourished male no acute distress cardiopulmonary exam normal  . #1 acute MI may 6........... angioplasty to 95% circumflex lesion with lesions as noted above  #2 symptoms of reflux esophagitis question esophageal stricture......... GI consult

## 2016-12-24 ENCOUNTER — Encounter: Payer: Self-pay | Admitting: Physician Assistant

## 2016-12-24 ENCOUNTER — Ambulatory Visit (INDEPENDENT_AMBULATORY_CARE_PROVIDER_SITE_OTHER): Payer: Medicare Other | Admitting: Physician Assistant

## 2016-12-24 VITALS — BP 116/88 | HR 61 | Ht 72.0 in | Wt 216.8 lb

## 2016-12-24 DIAGNOSIS — E785 Hyperlipidemia, unspecified: Secondary | ICD-10-CM | POA: Diagnosis not present

## 2016-12-24 DIAGNOSIS — Z955 Presence of coronary angioplasty implant and graft: Secondary | ICD-10-CM | POA: Diagnosis not present

## 2016-12-24 DIAGNOSIS — I2129 ST elevation (STEMI) myocardial infarction involving other sites: Secondary | ICD-10-CM

## 2016-12-24 DIAGNOSIS — R072 Precordial pain: Secondary | ICD-10-CM

## 2016-12-24 DIAGNOSIS — I213 ST elevation (STEMI) myocardial infarction of unspecified site: Secondary | ICD-10-CM | POA: Diagnosis not present

## 2016-12-24 DIAGNOSIS — I2121 ST elevation (STEMI) myocardial infarction involving left circumflex coronary artery: Secondary | ICD-10-CM | POA: Diagnosis not present

## 2016-12-24 MED ORDER — PANTOPRAZOLE SODIUM 40 MG PO TBEC: 40.0000 mg | DELAYED_RELEASE_TABLET | Freq: Every day | ORAL | 1 refills | Status: DC

## 2016-12-24 NOTE — Patient Instructions (Addendum)
Medication Instructions:  Your physician has recommended you make the following change in your medication:  1.  INCREASE the Protonix to 40 mg daily   Labwork: 4 WEEKS:  FASTING LIPID & LFT  Testing/Procedures: None ordered  Follow-Up: Your physician recommends that you schedule a follow-up appointment in: [place]  Any Other Special Instructions Will Be Listed Below (If Applicable).     If you need a refill on your cardiac medications before your next appointment, please call your pharmacy.

## 2016-12-26 ENCOUNTER — Other Ambulatory Visit: Payer: Self-pay | Admitting: *Deleted

## 2016-12-26 MED ORDER — TICAGRELOR 90 MG PO TABS: 90.0000 mg | ORAL_TABLET | Freq: Two times a day (BID) | ORAL | 3 refills | Status: DC

## 2016-12-27 ENCOUNTER — Other Ambulatory Visit: Payer: Self-pay | Admitting: Family Medicine

## 2016-12-27 ENCOUNTER — Other Ambulatory Visit: Payer: Self-pay

## 2016-12-27 DIAGNOSIS — E032 Hypothyroidism due to medicaments and other exogenous substances: Secondary | ICD-10-CM

## 2016-12-27 DIAGNOSIS — E039 Hypothyroidism, unspecified: Secondary | ICD-10-CM

## 2016-12-27 DIAGNOSIS — E038 Other specified hypothyroidism: Secondary | ICD-10-CM

## 2016-12-27 DIAGNOSIS — Z23 Encounter for immunization: Secondary | ICD-10-CM

## 2016-12-27 DIAGNOSIS — L820 Inflamed seborrheic keratosis: Secondary | ICD-10-CM

## 2016-12-27 DIAGNOSIS — J302 Other seasonal allergic rhinitis: Secondary | ICD-10-CM

## 2016-12-27 MED ORDER — ATORVASTATIN CALCIUM 80 MG PO TABS: 80.0000 mg | ORAL_TABLET | Freq: Every day | ORAL | 3 refills | Status: DC

## 2016-12-27 MED ORDER — SYNTHROID 88 MCG PO TABS: 88.0000 ug | ORAL_TABLET | Freq: Every day | ORAL | 1 refills | Status: DC

## 2016-12-27 MED ORDER — TAMSULOSIN HCL 0.4 MG PO CAPS: 0.4000 mg | ORAL_CAPSULE | Freq: Every day | ORAL | 1 refills | Status: DC

## 2016-12-27 MED ORDER — PANTOPRAZOLE SODIUM 40 MG PO TBEC: 40.0000 mg | DELAYED_RELEASE_TABLET | Freq: Every day | ORAL | 3 refills | Status: DC

## 2016-12-27 NOTE — Telephone Encounter (Signed)
Sent to the pharmacy by e-scribe. Was sent to local pharmacy for 6 months 08/2016.

## 2017-01-06 ENCOUNTER — Other Ambulatory Visit: Payer: Self-pay | Admitting: *Deleted

## 2017-01-06 DIAGNOSIS — J302 Other seasonal allergic rhinitis: Secondary | ICD-10-CM

## 2017-01-06 DIAGNOSIS — Z23 Encounter for immunization: Secondary | ICD-10-CM

## 2017-01-06 DIAGNOSIS — E038 Other specified hypothyroidism: Secondary | ICD-10-CM

## 2017-01-06 DIAGNOSIS — E039 Hypothyroidism, unspecified: Secondary | ICD-10-CM

## 2017-01-06 DIAGNOSIS — L820 Inflamed seborrheic keratosis: Secondary | ICD-10-CM

## 2017-01-06 DIAGNOSIS — E032 Hypothyroidism due to medicaments and other exogenous substances: Secondary | ICD-10-CM

## 2017-01-06 MED ORDER — TAMSULOSIN HCL 0.4 MG PO CAPS: 0.4000 mg | ORAL_CAPSULE | Freq: Every day | ORAL | 1 refills | Status: DC

## 2017-01-06 MED ORDER — SYNTHROID 88 MCG PO TABS: 88.0000 ug | ORAL_TABLET | Freq: Every day | ORAL | 1 refills | Status: DC

## 2017-01-09 ENCOUNTER — Telehealth (HOSPITAL_COMMUNITY): Payer: Self-pay

## 2017-01-10 NOTE — Telephone Encounter (Signed)
Cardiac Rehab - Pharmacy Resident Documentation   Patient unable to be reached after three call attempts. Please complete allergy verification and medication review during patient's cardiac rehab appointment.     

## 2017-01-15 ENCOUNTER — Telehealth (HOSPITAL_COMMUNITY): Payer: Self-pay

## 2017-01-15 NOTE — Telephone Encounter (Addendum)
*  updated insurance information* Medicare A/B - no co-pay, deductible $183.00/$183.00 has been met, 20% co-insurance, no out of pocket and no pre-authorization. Passport/reference [phone]. [employer] supplement- follows medicare guidelines, no co-payment, no deductible, no co-insurance, no out of pocket and no pre-authorization. Passport/reference [phone].

## 2017-01-16 ENCOUNTER — Encounter (HOSPITAL_COMMUNITY): Payer: Self-pay

## 2017-01-16 ENCOUNTER — Ambulatory Visit (INDEPENDENT_AMBULATORY_CARE_PROVIDER_SITE_OTHER): Payer: Medicare Other | Admitting: Gastroenterology

## 2017-01-16 ENCOUNTER — Encounter (HOSPITAL_COMMUNITY)
Admission: RE | Admit: 2017-01-16 | Discharge: 2017-01-16 | Disposition: A | Discharge status: Home / Self Care | Payer: Medicare Other | Source: Ambulatory Visit | Attending: Cardiovascular Disease | Admitting: Cardiovascular Disease

## 2017-01-16 ENCOUNTER — Encounter: Payer: Self-pay | Admitting: Gastroenterology

## 2017-01-16 VITALS — BP 124/80 | HR 65 | Ht 72.0 in | Wt 214.1 lb

## 2017-01-16 VITALS — BP 100/60 | HR 64 | Ht 72.0 in | Wt 214.0 lb

## 2017-01-16 DIAGNOSIS — Z955 Presence of coronary angioplasty implant and graft: Secondary | ICD-10-CM | POA: Insufficient documentation

## 2017-01-16 DIAGNOSIS — I2121 ST elevation (STEMI) myocardial infarction involving left circumflex coronary artery: Secondary | ICD-10-CM

## 2017-01-16 DIAGNOSIS — I213 ST elevation (STEMI) myocardial infarction of unspecified site: Secondary | ICD-10-CM | POA: Diagnosis not present

## 2017-01-16 DIAGNOSIS — K625 Hemorrhage of anus and rectum: Secondary | ICD-10-CM

## 2017-01-16 DIAGNOSIS — I2129 ST elevation (STEMI) myocardial infarction involving other sites: Secondary | ICD-10-CM | POA: Diagnosis not present

## 2017-01-16 DIAGNOSIS — R142 Eructation: Secondary | ICD-10-CM | POA: Diagnosis not present

## 2017-01-16 DIAGNOSIS — K219 Gastro-esophageal reflux disease without esophagitis: Secondary | ICD-10-CM | POA: Diagnosis not present

## 2017-01-16 DIAGNOSIS — K5901 Slow transit constipation: Secondary | ICD-10-CM | POA: Diagnosis not present

## 2017-01-16 DIAGNOSIS — R0789 Other chest pain: Secondary | ICD-10-CM

## 2017-01-16 HISTORY — DX: Hyperlipidemia, unspecified: E78.5

## 2017-01-16 HISTORY — DX: ST elevation (STEMI) myocardial infarction of unspecified site: I21.3

## 2017-01-16 HISTORY — DX: Atherosclerotic heart disease of native coronary artery without angina pectoris: I25.10

## 2017-01-16 NOTE — Progress Notes (Signed)
Cardiac Rehab Medication Review by a Pharmacist  Does the patient  feel that his/her medications are working for him/her?  YES   Has the patient been experiencing any side effects to the medications prescribed?  Yes  Does the patient measure his/her own blood pressure or blood glucose at home?  NA  Does the patient have any problems obtaining medications due to transportation or finances?   YES  Understanding of regimen: yes Understanding of indications: yesPotential of compliance: yes    Nurse comments, Ryan [name] has had some belching and saw the Gastroenterologist today. Ryan [name] will continue to take his medications as prescribed and monitor his symptoms.    Harrell Gave RN BSN 01/16/2017 2:01 PM

## 2017-01-16 NOTE — Patient Instructions (Addendum)
Constipation:  Citrucel or metamucil fiber supplement  - one heaping tablespoon in a glass of water daily.  If that does not seem to be helping after a couple of weeks, change to Miralax powder, one-half to one capful in a glass of any liquid once daily.  Adjust dose as needed.  __________________________________________________________________________   If you are age 72 or older, your body mass index should be between 23-30. Your Body mass index is 29.02 kg/m. If this is out of the aforementioned range listed, please consider follow up with your Primary Care Provider.  If you are age 42 or younger, your body mass index should be between 19-25. Your Body mass index is 29.02 kg/m. If this is out of the aformentioned range listed, please consider follow up with your Primary Care Provider.    Gastroesophageal Reflux Disease, Adult Normally, food travels down the esophagus and stays in the stomach to be digested. If a person has gastroesophageal reflux disease (GERD), food and stomach acid move back up into the esophagus. When this happens, the esophagus becomes sore and swollen (inflamed). Over time, GERD can make small holes (ulcers) in the lining of the esophagus. Follow these instructions at home: Diet  Follow a diet as told by your doctor. You may need to avoid foods and drinks such as: ? Coffee and tea (with or without caffeine). ? Drinks that contain alcohol. ? Energy drinks and sports drinks. ? Carbonated drinks or sodas. ? Chocolate and cocoa. ? Peppermint and mint flavorings. ? Garlic and onions. ? Horseradish. ? Spicy and acidic foods, such as peppers, chili powder, curry powder, vinegar, hot sauces, and BBQ sauce. ? Citrus fruit juices and citrus fruits, such as oranges, lemons, and limes. ? Tomato-based foods, such as red sauce, chili, salsa, and pizza with red sauce. ? Fried and fatty foods, such as donuts, french fries, potato chips, and high-fat dressings. ? High-fat  meats, such as hot dogs, rib eye steak, sausage, ham, and bacon. ? High-fat dairy items, such as whole milk, butter, and cream cheese.  Eat small meals often. Avoid eating large meals.  Avoid drinking large amounts of liquid with your meals.  Avoid eating meals during the 2-3 hours before bedtime.  Avoid lying down right after you eat.  Do not exercise right after you eat. General instructions  Pay attention to any changes in your symptoms.  Take over-the-counter and prescription medicines only as told by your doctor. Do not take aspirin, ibuprofen, or other NSAIDs unless your doctor says it is okay.  Do not use any tobacco products, including cigarettes, chewing tobacco, and e-cigarettes. If you need help quitting, ask your doctor.  Wear loose clothes. Do not wear anything tight around your waist.  Raise (elevate) the head of your bed about 6 inches (15 cm).  Try to lower your stress. If you need help doing this, ask your doctor.  If you are overweight, lose an amount of weight that is healthy for you. Ask your doctor about a safe weight loss goal.  Keep all follow-up visits as told by your doctor. This is important. Contact a doctor if:  You have new symptoms.  You lose weight and you do not know why it is happening.  You have trouble swallowing, or it hurts to swallow.  You have wheezing or a cough that keeps happening.  Your symptoms do not get better with treatment.  You have a hoarse voice. Get help right away if:  You have pain in  your arms, neck, jaw, teeth, or back.  You feel sweaty, dizzy, or light-headed.  You have chest pain or shortness of breath.  You throw up (vomit) and your throw up looks like blood or coffee grounds.  You pass out (faint).  Your poop (stool) is bloody or black.  You cannot swallow, drink, or eat. This information is not intended to replace advice given to you by your health care provider. Make sure you discuss any questions  you have with your health care provider. Document Released: 01/08/2008 Document Revised: 12/28/2015 Document Reviewed: 11/16/2014 Elsevier Interactive Patient Education  2018 [place] for Gastroesophageal Reflux Disease, Adult When you have gastroesophageal reflux disease (GERD), the foods you eat and your eating habits are very important. Choosing the right foods can help ease your discomfort. What guidelines do I need to follow?  Choose fruits, vegetables, whole grains, and low-fat dairy products.  Choose low-fat meat, fish, and poultry.  Limit fats such as oils, salad dressings, butter, nuts, and avocado.  Keep a food diary. This helps you identify foods that cause symptoms.  Avoid foods that cause symptoms. These may be different for everyone.  Eat small meals often instead of 3 large meals a day.  Eat your meals slowly, in a place where you are relaxed.  Limit fried foods.  Cook foods using methods other than frying.  Avoid drinking alcohol.  Avoid drinking large amounts of liquids with your meals.  Avoid bending over or lying down until 2-3 hours after eating. What foods are not recommended? These are some foods and drinks that may make your symptoms worse: Vegetables Tomatoes. Tomato juice. Tomato and spaghetti sauce. Chili peppers. Onion and garlic. Horseradish. Fruits Oranges, grapefruit, and lemon (fruit and juice). Meats High-fat meats, fish, and poultry. This includes hot dogs, ribs, ham, sausage, salami, and bacon. Dairy Whole milk and chocolate milk. Sour cream. Cream. Butter. Ice cream. Cream cheese. Drinks Coffee and tea. Bubbly (carbonated) drinks or energy drinks. Condiments Hot sauce. Barbecue sauce. Sweets/Desserts Chocolate and cocoa. Donuts. Peppermint and spearmint. Fats and Oils High-fat foods. This includes [place] fries and potato chips. Other Vinegar. Strong spices. This includes black pepper, white pepper, red pepper,  cayenne, curry powder, cloves, ginger, and chili powder. The items listed above may not be a complete list of foods and drinks to avoid. Contact your dietitian for more information. This information is not intended to replace advice given to you by your health care provider. Make sure you discuss any questions you have with your health care provider. Document Released: 01/21/2012 Document Revised: 12/28/2015 Document Reviewed: 05/26/2013 Elsevier Interactive Patient Education  2017 [place].   Thank you for choosing [facility]   Dr Wilfrid Lund III

## 2017-01-16 NOTE — Progress Notes (Signed)
[facility] Consult Note:  History: Ryan [name] 01/16/2017  Referring physician: Dorena Cookey, MD  Reason for consult/chief complaint: Chest Pain (midsternal chest pain starting in May; thought was indigestion but had MI; continues with midsternal chest pain, belching; now on pantoprazole 40 mg daily) and Constipation (with some rectal bleeding and fairly significant rectal pain)   Subjective  HPI:  This is a 71 year old man referred to [place] by primary care for chest pain and reflux symptoms. He reports perhaps a year of intermittent pyrosis, belching and bloating. He felt that he was having a more severe episode of this last month, and was in fact admitted with an acute inferior-posterior STEMI. Review of the May 6 cardiac catheterization report reveals "severe thrombotic stenosis" of the left circumflex artery treated with a drug-eluting stent. He was also noted to have moderate diffuse LAD, RCA and ramus intermedius stenoses. These were felt to affect small caliber vessels and would best be managed with medical therapy. He is to continue aspirin and Brilinta for 12 months without interruption.  He has better symptom control of his pyrosis and belching on a prescription dose of PPI. He still has some intermittent symptoms, is most bothered by the burping and belching. He denies dysphagia, his appetite is dropped off a little since the recent illness and he may have lost a few pounds as a result. He had not been having weight loss prior to the recent MI.  In addition, he has tended toward constipation for many years. The stool is quite firm and he has to sit and strain for a while. If he has not had a good BM for several days he will start to develop rectal pain and bleeding. He does not recall bleeding having been an issue in the past, but he was put on 2 antiplatelet agents after this recent stent placement.  He saw Dr. Deatra Ina in October 2014 for chronic  constipation. I reviewed that report, which revealed internal hemorrhoids as well as a diminutive adenomatous polyp and a diminutive hyperplastic polyp. The patient was scheduled to be recalled in 5 years.  Overall, Ryan [name] is concerned because he has these persistent upper GI symptoms and doesn't know if any of it could be cardiac in nature.  ROS:  Review of Systems  Constitutional: Negative for appetite change and unexpected weight change.  HENT: Negative for mouth sores and voice change.   Eyes: Negative for pain and redness.  Respiratory: Negative for cough and shortness of breath.   Cardiovascular: Positive for chest pain. Negative for palpitations.  Genitourinary: Negative for dysuria and hematuria.  Musculoskeletal: Negative for arthralgias and myalgias.  Skin: Negative for pallor and rash.  Neurological: Negative for weakness and headaches.  Hematological: Negative for adenopathy.   He does not recall having had any exertional chest pain prior to the MI, but in retrospect feels as if he was probably short of breath at times with exertion. He still seems to feel that way, and thinks perhaps he is just become deconditioned. He is planning to start cardiac rehabilitation tomorrow.  Past Medical History: Past Medical History:  Diagnosis Date  . Allergy   . Anxiety   . Arthritis   . Asthma   . Cataract   . Hypothyroidism      Past Surgical History: Past Surgical History:  Procedure Laterality Date  . cataract Bilateral 03/2016  . COLONOSCOPY  10.27.2005  . LEFT HEART CATH AND CORONARY ANGIOGRAPHY N/A 12/08/2016   Procedure:  Left Heart Cath and Coronary Angiography;  Surgeon: Sherren Mocha, MD;  Location: [facility] [place];  Service: Cardiovascular;  Laterality: N/A;  . SHOULDER SURGERY Right   . TONSILLECTOMY       Family History: Family History  Problem Relation Age of Onset  . Diabetes Mother   . Heart failure Mother   . Irritable bowel syndrome Mother   .  Diverticulosis Mother   . Lung cancer Father   . Heart disease Sister   . Irritable bowel syndrome Sister   . Diverticulosis Sister   . Colon cancer Neg Hx   . Esophageal cancer Neg Hx   . Pancreatic cancer Neg Hx   . Stomach cancer Neg Hx     Social History: Social History   Social History  . Marital status: Married    Spouse name: N/A  . Number of children: 2  . Years of education: N/A   Occupational History  . retired    Social History Main Topics  . Smoking status: Never Smoker  . Smokeless tobacco: Never Used  . Alcohol use Yes     Comment: less than 1 per day  . Drug use: No  . Sexual activity: Not Asked   Other Topics Concern  . None   Social History Narrative  . None    Allergies: Allergies  Allergen Reactions  . Contrast Media [Iodinated Diagnostic Agents] Shortness Of Breath and Swelling    Swelling of the throat   . Erythromycin Nausea And Vomiting  . Niacin And Related Swelling  . Penicillins Hives    Has patient had a PCN reaction causing immediate rash, facial/tongue/throat swelling, SOB or lightheadedness with hypotension: Y Has patient had a PCN reaction causing severe rash involving mucus membranes or skin necrosis: No Has patient had a PCN reaction that required hospitalization: No Has patient had a PCN reaction occurring within the last 10 years: No If all of the above answers are "NO", then may proceed with Cephalosporin use.    Outpatient Meds: Current Outpatient Prescriptions  Medication Sig Dispense Refill  . acetaminophen (TYLENOL) 325 MG tablet Take 325-650 mg by mouth every 6 (six) hours as needed for mild pain or moderate pain.    Marland Kitchen albuterol (PROVENTIL HFA;VENTOLIN HFA) 108 (90 Base) MCG/ACT inhaler Inhale 2 puffs into the lungs every 6 (six) hours as needed for wheezing. 1 Inhaler 1  . aspirin EC 81 MG EC tablet Take 1 tablet (81 mg total) by mouth daily. 30 tablet 11  . atorvastatin (LIPITOR) 80 MG tablet Take 1 tablet (80 mg  total) by mouth daily at 6 PM. 90 tablet 3  . bisacodyl (DULCOLAX) 5 MG EC tablet Take 5 mg by mouth daily as needed for moderate constipation.    . Cholecalciferol (VITAMIN D3) 5000 units CAPS Take 1 capsule by mouth every morning.    Marland Kitchen FISH OIL-KRILL OIL PO Take 1 tablet by mouth daily.    . nitroGLYCERIN (NITROSTAT) 0.4 MG SL tablet Place 1 tablet (0.4 mg total) under the tongue every 5 (five) minutes x 3 doses as needed for chest pain. 12 tablet 12  . pantoprazole (PROTONIX) 40 MG tablet Take 1 tablet (40 mg total) by mouth daily. 90 tablet 3  . SYNTHROID 88 MCG tablet Take 1 tablet (88 mcg total) by mouth daily. 90 tablet 1  . tamsulosin (FLOMAX) 0.4 MG CAPS capsule Take 1 capsule (0.4 mg total) by mouth daily. 90 capsule 1  . ticagrelor (BRILINTA) 90 MG  TABS tablet Take 1 tablet (90 mg total) by mouth 2 (two) times daily. 180 tablet 3   No current facility-administered medications for this visit.       ___________________________________________________________________ Objective   Exam:  BP 100/60   Pulse 64   Ht 6' (1.829 m)   Wt 214 lb (97.1 kg)   BMI 29.02 kg/m    General: this is a(n) Well-appearing man with normal vocal quality good muscle mass   Eyes: sclera anicteric, no redness  ENT: oral mucosa moist without lesions, no cervical or supraclavicular lymphadenopathy, good dentition  CV: RRR without murmur, S1/S2, no JVD, no peripheral edema  Resp: clear to auscultation bilaterally, normal RR and effort noted  GI: soft, no tenderness, with active bowel sounds. No guarding or palpable organomegaly noted.  Skin; warm and dry, no rash or jaundice noted  Neuro: awake, alert and oriented x 3. Normal gross motor function and fluent speech Rectal: Normal external, normal sphincter tone, no fissure or palpable internal lesion. He has a large amount of firm stool in the rectal vault.  Labs:  CBC Latest Ref Rng & Units 12/16/2016 12/09/2016 12/08/2016  WBC 4.0 - 10.5 K/uL  10.5 10.1 11.4(H)  Hemoglobin 13.0 - 17.0 g/dL 14.0 14.5 14.2  Hematocrit 39.0 - 52.0 % 41.1 42.6 41.7  Platelets 150 - 400 K/uL 291 281 274   CMP Latest Ref Rng & Units 12/17/2016 12/16/2016 12/09/2016  Glucose 65 - 99 mg/dL 97 119(H) 172(H)  BUN 6 - 20 mg/dL 17 23(H) 20  Creatinine 0.61 - 1.24 mg/dL 0.93 1.09 0.94  Sodium 135 - 145 mmol/L 137 136 137  Potassium 3.5 - 5.1 mmol/L 3.7 3.9 3.7  Chloride 101 - 111 mmol/L 106 104 107  CO2 22 - 32 mmol/L 23 25 22   Calcium 8.9 - 10.3 mg/dL 8.9 9.2 8.9  Total Protein 6.0 - 8.3 g/dL - - -  Total Bilirubin 0.2 - 1.2 mg/dL - - -  Alkaline Phos 39 - 117 U/L - - -  AST 0 - 37 U/L - - -  ALT 0 - 53 U/L - - -     Radiologic Studies: Cardiac cath report as reviewed above  Assessment: Encounter Diagnoses  Name Primary?  . Midsternal chest pain Yes  . Gastroesophageal reflux disease, esophagitis presence not specified   . Belching   . Slow transit constipation   . Rectal bleeding   . STEMI involving left circumflex coronary artery [facility])   He seems to have uncompensated GERD symptoms. Although there can certainly be overlap in perceived symptoms of angina and GERD, he does not seem to have any exertional component to this current chest discomfort. It has a burning quality consistent with reflux.    We had a long discussion about the nature of GERD and the nonpharmacologic treatments, which can be equally as important as acid suppression. Written information about this was also given to him.  He appears to have many years of chronic constipation for which I recommended a daily fiber supplement. If he does not find that helpful after a couple of weeks, he will start once daily MiraLAX, one half to one capful per day. I believe that will help relieve the constipation and thereby the rectal pain and hemorrhoidal bleeding.  I do not feel he needs any endoscopic procedures at this point.  I would like him to remain on the current dose of PPI, and I will  see him back in about 2 months for  follow-up.  Thank you for the courtesy of this consult.  Please call me with any questions or concerns.  Nelida Meuse III  CC: Dorena Cookey, MD  Sherren Mocha, M.D.   [facility]

## 2017-01-16 NOTE — Progress Notes (Signed)
Cardiac Individual Treatment Plan  Patient Details  Name: Ryan [name] MRN: [id number] Date of Birth: October 03, 1945 Referring Provider:     CARDIAC REHAB PHASE II ORIENTATION from 01/16/2017 in [place]  Referring Provider  Sherren Mocha MD      Initial Encounter Date:    CARDIAC REHAB PHASE II ORIENTATION from 01/16/2017 in [place]  Date  01/16/17  Referring Provider  Sherren Mocha MD      Visit Diagnosis: S/P drug eluting coronary stent placement  ST elevation myocardial infarction involving left circumflex coronary artery ([place])  Patient's Home Medications on Admission:  Current Outpatient Prescriptions:  .  acetaminophen (TYLENOL) 325 MG tablet, Take 325-650 mg by mouth every 6 (six) hours as needed for mild pain or moderate pain., Disp: , Rfl:  .  albuterol (PROVENTIL HFA;VENTOLIN HFA) 108 (90 Base) MCG/ACT inhaler, Inhale 2 puffs into the lungs every 6 (six) hours as needed for wheezing., Disp: 1 Inhaler, Rfl: 1 .  aspirin EC 81 MG EC tablet, Take 1 tablet (81 mg total) by mouth daily., Disp: 30 tablet, Rfl: 11 .  atorvastatin (LIPITOR) 80 MG tablet, Take 1 tablet (80 mg total) by mouth daily at 6 PM., Disp: 90 tablet, Rfl: 3 .  bisacodyl (DULCOLAX) 5 MG EC tablet, Take 5 mg by mouth daily as needed for moderate constipation., Disp: , Rfl:  .  Cholecalciferol (VITAMIN D3) 5000 units CAPS, Take 1 capsule by mouth every morning., Disp: , Rfl:  .  FISH OIL-KRILL OIL PO, Take 1 tablet by mouth daily., Disp: , Rfl:  .  nitroGLYCERIN (NITROSTAT) 0.4 MG SL tablet, Place 1 tablet (0.4 mg total) under the tongue every 5 (five) minutes x 3 doses as needed for chest pain., Disp: 12 tablet, Rfl: 12 .  pantoprazole (PROTONIX) 40 MG tablet, Take 1 tablet (40 mg total) by mouth daily., Disp: 90 tablet, Rfl: 3 .  SYNTHROID 88 MCG tablet, Take 1 tablet (88 mcg total) by mouth daily., Disp: 90 tablet, Rfl: 1 .  tamsulosin  (FLOMAX) 0.4 MG CAPS capsule, Take 1 capsule (0.4 mg total) by mouth daily., Disp: 90 capsule, Rfl: 1 .  ticagrelor (BRILINTA) 90 MG TABS tablet, Take 1 tablet (90 mg total) by mouth 2 (two) times daily., Disp: 180 tablet, Rfl: 3  Past Medical History: Past Medical History:  Diagnosis Date  . Allergy   . Anxiety   . Arthritis   . Asthma   . Cataract   . Coronary artery disease 12/08/2016   STEMI, DES Circumflex  . Hyperlipidemia   . Hypothyroidism   . STEMI (ST elevation myocardial infarction) ([place]) 12/08/2016    Tobacco Use: History  Smoking Status  . Never Smoker  Smokeless Tobacco  . Never Used    Labs: Recent Review Flowsheet Data    Labs for ITP Cardiac and Pulmonary Rehab Latest Ref Rng & Units 05/13/2013 05/17/2014 06/05/2015 08/28/2016 12/08/2016   Cholestrol 0 - 200 mg/dL 206(H) 209(H) 217(H) 222(H) 206(H)   LDLCALC 0 - 99 mg/dL - 150(H) 144(H) 157(H) 144(H)   LDLDIRECT mg/dL 144.0 - - - -   HDL >40 mg/dL 48.00 39.20 49.40 44.40 44   Trlycerides <150 mg/dL 82.0 99.0 117.0 103.0 92      Capillary Blood Glucose: No results found for: GLUCAP   Exercise Target Goals: Date: 01/16/17  Exercise Program Goal: Individual exercise prescription set with THRR, safety & activity barriers. Participant demonstrates ability to understand and  report RPE using BORG scale, to self-measure pulse accurately, and to acknowledge the importance of the exercise prescription.  Exercise Prescription Goal: Starting with aerobic activity 30 plus minutes a day, 3 days per week for initial exercise prescription. Provide home exercise prescription and guidelines that participant acknowledges understanding prior to discharge.  Activity Barriers & Risk Stratification:     Activity Barriers & Cardiac Risk Stratification - 01/16/17 1556      Activity Barriers & Cardiac Risk Stratification   Activity Barriers None   Cardiac Risk Stratification High      6 Minute Walk:     6 Minute  Walk    Row Name 01/16/17 1450         6 Minute Walk   Phase Initial     Distance 1615 feet     Walk Time 6 minutes     # of Rest Breaks 0     MPH 3.1     METS 3     RPE 9     VO2 Peak 10.4     Symptoms No     Resting HR 65 bpm     Resting BP 124/80     Max Ex. HR 83 bpm     Max Ex. BP 100/70     2 Minute Post BP 118/60        Oxygen Initial Assessment:   Oxygen Re-Evaluation:   Oxygen Discharge (Final Oxygen Re-Evaluation):   Initial Exercise Prescription:     Initial Exercise Prescription - 01/16/17 1400      Date of Initial Exercise RX and Referring Provider   Date 01/16/17   Referring Provider Sherren Mocha MD     Treadmill   MPH 2.5   Grade 0   Minutes 10   METs 3     Bike   Level 0.8   Minutes 10   METs 3     NuStep   Level 3   SPM 85   Minutes 10   METs 3     Prescription Details   Frequency (times per week) 3   Duration Progress to 45 minutes of aerobic exercise without signs/symptoms of physical distress     Intensity   THRR 40-80% of Max Heartrate 59-118   Ratings of Perceived Exertion 11-13   Perceived Dyspnea 0-4     Progression   Progression Continue to progress workloads to maintain intensity without signs/symptoms of physical distress.     Resistance Training   Training Prescription Yes   Weight 3   Reps 10-15      Perform Capillary Blood Glucose checks as needed.  Exercise Prescription Changes:   Exercise Comments:   Exercise Goals and Review:      Exercise Goals    Row Name 01/16/17 1420             Exercise Goals   Increase Physical Activity Yes       Intervention Provide advice, education, support and counseling about physical activity/exercise needs.;Develop an individualized exercise prescription for aerobic and resistive training based on initial evaluation findings, risk stratification, comorbidities and participant's personal goals.       Expected Outcomes Achievement of increased  cardiorespiratory fitness and enhanced flexibility, muscular endurance and strength shown through measurements of functional capacity and personal statement of participant.       Increase Strength and Stamina Yes       Intervention Provide advice, education, support and counseling about physical activity/exercise needs.;Develop an individualized exercise prescription  for aerobic and resistive training based on initial evaluation findings, risk stratification, comorbidities and participant's personal goals.       Expected Outcomes Achievement of increased cardiorespiratory fitness and enhanced flexibility, muscular endurance and strength shown through measurements of functional capacity and personal statement of participant.          Exercise Goals Re-Evaluation :    Discharge Exercise Prescription (Final Exercise Prescription Changes):   Nutrition:  Target Goals: Understanding of nutrition guidelines, daily intake of sodium 1500mg , cholesterol 200mg , calories 30% from fat and 7% or less from saturated fats, daily to have 5 or more servings of fruits and vegetables.  Biometrics:     Pre Biometrics - 01/16/17 1547      Pre Biometrics   Height 6' (1.829 m)   Weight 214 lb 1.1 oz (97.1 kg)   Waist Circumference 43.25 inches   Hip Circumference 43.75 inches   Waist to Hip Ratio 0.99 %   BMI (Calculated) 29.1   Triceps Skinfold 26 mm   % Body Fat 31.6 %   Grip Strength 51 kg   Flexibility 10 in   Single Leg Stand 30 seconds       Nutrition Therapy Plan and Nutrition Goals:   Nutrition Discharge: Nutrition Scores:   Nutrition Goals Re-Evaluation:   Nutrition Goals Re-Evaluation:   Nutrition Goals Discharge (Final Nutrition Goals Re-Evaluation):   Psychosocial: Target Goals: Acknowledge presence or absence of significant depression and/or stress, maximize coping skills, provide positive support system. Participant is able to verbalize types and ability to use  techniques and skills needed for reducing stress and depression.  Initial Review & Psychosocial Screening:     Initial Psych Review & Screening - 01/16/17 1544      Initial Review   Current issues with None Identified     Family Dynamics   Good Support System? Yes     Barriers   Psychosocial barriers to participate in program There are no identifiable barriers or psychosocial needs.     Screening Interventions   Interventions Encouraged to exercise      Quality of Life Scores:     Quality of Life - 01/16/17 1503      Quality of Life Scores   Health/Function Pre 23.1 %   Socioeconomic Pre 25.93 %   Psych/Spiritual Pre 25.71 %   Family Pre 28.8 %   GLOBAL Pre 25.06 %      PHQ-9: Recent Review Flowsheet Data    Depression screen [facility] 2/9 08/28/2016 06/05/2015 05/17/2014 05/13/2013   Decreased Interest 0 0 0 0   Down, Depressed, Hopeless 0 0 0 0   PHQ - 2 Score 0 0 0 0     Interpretation of Total Score  Total Score Depression Severity:  1-4 = Minimal depression, 5-9 = Mild depression, 10-14 = Moderate depression, 15-19 = Moderately severe depression, 20-27 = Severe depression   Psychosocial Evaluation and Intervention:   Psychosocial Re-Evaluation:   Psychosocial Discharge (Final Psychosocial Re-Evaluation):   Vocational Rehabilitation: Provide vocational rehab assistance to qualifying candidates.   Vocational Rehab Evaluation & Intervention:     Vocational Rehab - 01/16/17 1544      Initial Vocational Rehab Evaluation & Intervention   Assessment shows need for Vocational Rehabilitation No      Education: Education Goals: Education classes will be provided on a weekly basis, covering required topics. Participant will state understanding/return demonstration of topics presented.  Learning Barriers/Preferences:     Learning Barriers/Preferences - 01/16/17 1543  Learning Barriers/Preferences   Learning Barriers None   Learning Preferences  Skilled Demonstration;Verbal Instruction;Written Material;Audio      Education Topics: Count Your Pulse:  -Group instruction provided by verbal instruction, demonstration, patient participation and written materials to support subject.  Instructors address importance of being able to find your pulse and how to count your pulse when at home without a heart monitor.  Patients get hands on experience counting their pulse with staff help and individually.   Heart Attack, Angina, and Risk Factor Modification:  -Group instruction provided by verbal instruction, video, and written materials to support subject.  Instructors address signs and symptoms of angina and heart attacks.    Also discuss risk factors for heart disease and how to make changes to improve heart health risk factors.   Functional Fitness:  -Group instruction provided by verbal instruction, demonstration, patient participation, and written materials to support subject.  Instructors address safety measures for doing things around the house.  Discuss how to get up and down off the floor, how to pick things up properly, how to safely get out of a chair without assistance, and balance training.   Meditation and Mindfulness:  -Group instruction provided by verbal instruction, patient participation, and written materials to support subject.  Instructor addresses importance of mindfulness and meditation practice to help reduce stress and improve awareness.  Instructor also leads participants through a meditation exercise.    Stretching for Flexibility and Mobility:  -Group instruction provided by verbal instruction, patient participation, and written materials to support subject.  Instructors lead participants through series of stretches that are designed to increase flexibility thus improving mobility.  These stretches are additional exercise for major muscle groups that are typically performed during regular warm up and cool  down.   Hands Only CPR:  -Group verbal, video, and participation provides a basic overview of AHA guidelines for community CPR. Role-play of emergencies allow participants the opportunity to practice calling for help and chest compression technique with discussion of AED use.   Hypertension: -Group verbal and written instruction that provides a basic overview of hypertension including the most recent diagnostic guidelines, risk factor reduction with self-care instructions and medication management.    Nutrition I class: Heart Healthy Eating:  -Group instruction provided by PowerPoint slides, verbal discussion, and written materials to support subject matter. The instructor gives an explanation and review of the Therapeutic Lifestyle Changes diet recommendations, which includes a discussion on lipid goals, dietary fat, sodium, fiber, plant stanol/sterol esters, sugar, and the components of a well-balanced, healthy diet.   Nutrition II class: Lifestyle Skills:  -Group instruction provided by PowerPoint slides, verbal discussion, and written materials to support subject matter. The instructor gives an explanation and review of label reading, grocery shopping for heart health, heart healthy recipe modifications, and ways to make healthier choices when eating out.   Diabetes Question & Answer:  -Group instruction provided by PowerPoint slides, verbal discussion, and written materials to support subject matter. The instructor gives an explanation and review of diabetes co-morbidities, pre- and post-prandial blood glucose goals, pre-exercise blood glucose goals, signs, symptoms, and treatment of hypoglycemia and hyperglycemia, and foot care basics.   Diabetes Blitz:  -Group instruction provided by PowerPoint slides, verbal discussion, and written materials to support subject matter. The instructor gives an explanation and review of the physiology behind type 1 and type 2 diabetes, diabetes  medications and rational behind using different medications, pre- and post-prandial blood glucose recommendations and Hemoglobin A1c goals, diabetes diet,  and exercise including blood glucose guidelines for exercising safely.    Portion Distortion:  -Group instruction provided by PowerPoint slides, verbal discussion, written materials, and food models to support subject matter. The instructor gives an explanation of serving size versus portion size, changes in portions sizes over the last 20 years, and what consists of a serving from each food group.   Stress Management:  -Group instruction provided by verbal instruction, video, and written materials to support subject matter.  Instructors review role of stress in heart disease and how to cope with stress positively.     Exercising on Your Own:  -Group instruction provided by verbal instruction, power point, and written materials to support subject.  Instructors discuss benefits of exercise, components of exercise, frequency and intensity of exercise, and end points for exercise.  Also discuss use of nitroglycerin and activating EMS.  Review options of places to exercise outside of rehab.  Review guidelines for sex with heart disease.   Cardiac Drugs I:  -Group instruction provided by verbal instruction and written materials to support subject.  Instructor reviews cardiac drug classes: antiplatelets, anticoagulants, beta blockers, and statins.  Instructor discusses reasons, side effects, and lifestyle considerations for each drug class.   Cardiac Drugs II:  -Group instruction provided by verbal instruction and written materials to support subject.  Instructor reviews cardiac drug classes: angiotensin converting enzyme inhibitors (ACE-I), angiotensin II receptor blockers (ARBs), nitrates, and calcium channel blockers.  Instructor discusses reasons, side effects, and lifestyle considerations for each drug class.   Anatomy and Physiology of the  Circulatory System:  Group verbal and written instruction and models provide basic cardiac anatomy and physiology, with the coronary electrical and arterial systems. Review of: AMI, Angina, Valve disease, Heart Failure, Peripheral Artery Disease, Cardiac Arrhythmia, Pacemakers, and the ICD.   Other Education:  -Group or individual verbal, written, or video instructions that support the educational goals of the cardiac rehab program.   Knowledge Questionnaire Score:     Knowledge Questionnaire Score - 01/16/17 1419      Knowledge Questionnaire Score   Pre Score 19/24      Core Components/Risk Factors/Patient Goals at Admission:     Personal Goals and Risk Factors at Admission - 01/16/17 1606      Core Components/Risk Factors/Patient Goals on Admission    Weight Management Yes;Weight Loss   Intervention Weight Management: Develop a combined nutrition and exercise program designed to reach desired caloric intake, while maintaining appropriate intake of nutrient and fiber, sodium and fats, and appropriate energy expenditure required for the weight goal.   Expected Outcomes Short Term: Continue to assess and modify interventions until short term weight is achieved;Long Term: Adherence to nutrition and physical activity/exercise program aimed toward attainment of established weight goal;Weight Maintenance: Understanding of the daily nutrition guidelines, which includes 25-35% calories from fat, 7% or less cal from saturated fats, less than 200mg  cholesterol, less than 1.5gm of sodium, & 5 or more servings of fruits and vegetables daily;Weight Loss: Understanding of general recommendations for a balanced deficit meal plan, which promotes 1-2 lb weight loss per week and includes a negative energy balance of [phone] kcal/d;Understanding recommendations for meals to include 15-35% energy as protein, 25-35% energy from fat, 35-60% energy from carbohydrates, less than 200mg  of dietary cholesterol,  20-35 gm of total fiber daily;Understanding of distribution of calorie intake throughout the day with the consumption of 4-5 meals/snacks   Lipids Yes   Intervention Provide education and support for participant on nutrition &  aerobic/resistive exercise along with prescribed medications to achieve LDL 70mg , HDL >40mg .   Expected Outcomes Short Term: Participant states understanding of desired cholesterol values and is compliant with medications prescribed. Participant is following exercise prescription and nutrition guidelines.;Long Term: Cholesterol controlled with medications as prescribed, with individualized exercise RX and with personalized nutrition plan. Value goals: LDL < 70mg , HDL > 40 mg.   Personal Goal Other Yes   Personal Goal To loose 20 lbs      Core Components/Risk Factors/Patient Goals Review:    Core Components/Risk Factors/Patient Goals at Discharge (Final Review):    ITP Comments:     ITP Comments    Row Name 01/16/17 1420 01/16/17 1422         ITP Comments Fransico Him MD Fransico Him MD, Medical Director         Comments: Ronalee Belts  attended orientation from 1330 to 1530 to review rules and guidelines for program. Completed 6 minute walk test, Intitial ITP, and exercise prescription.  VSS. Telemetry-Sinus Rhythm. Ronalee Belts did report experiencing some belching after his walk test. Ronalee Belts saw the Gastroenterologist earlier today and reports that his symptoms are getting better. Will continue to monitor the patient throughout  the program.Reymundo Winship Venetia Maxon, RN,BSN 01/16/2017 4:06 PM

## 2017-01-20 ENCOUNTER — Encounter (HOSPITAL_COMMUNITY)
Admission: RE | Admit: 2017-01-20 | Discharge: 2017-01-20 | Disposition: A | Discharge status: Home / Self Care | Payer: Medicare Other | Source: Ambulatory Visit | Attending: Cardiovascular Disease | Admitting: Cardiovascular Disease

## 2017-01-20 DIAGNOSIS — Z955 Presence of coronary angioplasty implant and graft: Secondary | ICD-10-CM

## 2017-01-20 DIAGNOSIS — I2121 ST elevation (STEMI) myocardial infarction involving left circumflex coronary artery: Secondary | ICD-10-CM

## 2017-01-20 DIAGNOSIS — I213 ST elevation (STEMI) myocardial infarction of unspecified site: Secondary | ICD-10-CM | POA: Diagnosis not present

## 2017-01-20 NOTE — Progress Notes (Signed)
Cardiac Individual Treatment Plan  Patient Details  Name: Ryan [name] MRN: [id number] Date of Birth: 1945/08/29 Referring Provider:     CARDIAC REHAB PHASE II ORIENTATION from 01/16/2017 in [place]  Referring Provider  Sherren Mocha MD      Initial Encounter Date:    CARDIAC REHAB PHASE II ORIENTATION from 01/16/2017 in [place]  Date  01/16/17  Referring Provider  Sherren Mocha MD      Visit Diagnosis: S/P drug eluting coronary stent placement  ST elevation myocardial infarction involving left circumflex coronary artery ([place])  Patient's Home Medications on Admission:  Current Outpatient Prescriptions:  .  acetaminophen (TYLENOL) 325 MG tablet, Take 325-650 mg by mouth every 6 (six) hours as needed for mild pain or moderate pain., Disp: , Rfl:  .  albuterol (PROVENTIL HFA;VENTOLIN HFA) 108 (90 Base) MCG/ACT inhaler, Inhale 2 puffs into the lungs every 6 (six) hours as needed for wheezing., Disp: 1 Inhaler, Rfl: 1 .  aspirin EC 81 MG EC tablet, Take 1 tablet (81 mg total) by mouth daily., Disp: 30 tablet, Rfl: 11 .  atorvastatin (LIPITOR) 80 MG tablet, Take 1 tablet (80 mg total) by mouth daily at 6 PM., Disp: 90 tablet, Rfl: 3 .  bisacodyl (DULCOLAX) 5 MG EC tablet, Take 5 mg by mouth daily as needed for moderate constipation., Disp: , Rfl:  .  Cholecalciferol (VITAMIN D3) 5000 units CAPS, Take 1 capsule by mouth every morning., Disp: , Rfl:  .  FISH OIL-KRILL OIL PO, Take 1 tablet by mouth daily., Disp: , Rfl:  .  nitroGLYCERIN (NITROSTAT) 0.4 MG SL tablet, Place 1 tablet (0.4 mg total) under the tongue every 5 (five) minutes x 3 doses as needed for chest pain., Disp: 12 tablet, Rfl: 12 .  pantoprazole (PROTONIX) 40 MG tablet, Take 1 tablet (40 mg total) by mouth daily., Disp: 90 tablet, Rfl: 3 .  SYNTHROID 88 MCG tablet, Take 1 tablet (88 mcg total) by mouth daily., Disp: 90 tablet, Rfl: 1 .  tamsulosin  (FLOMAX) 0.4 MG CAPS capsule, Take 1 capsule (0.4 mg total) by mouth daily., Disp: 90 capsule, Rfl: 1 .  ticagrelor (BRILINTA) 90 MG TABS tablet, Take 1 tablet (90 mg total) by mouth 2 (two) times daily., Disp: 180 tablet, Rfl: 3  Past Medical History: Past Medical History:  Diagnosis Date  . Allergy   . Anxiety   . Arthritis   . Asthma   . Cataract   . Coronary artery disease 12/08/2016   STEMI, DES Circumflex  . Hyperlipidemia   . Hypothyroidism   . STEMI (ST elevation myocardial infarction) ([place]) 12/08/2016    Tobacco Use: History  Smoking Status  . Never Smoker  Smokeless Tobacco  . Never Used    Labs: Recent Review Flowsheet Data    Labs for ITP Cardiac and Pulmonary Rehab Latest Ref Rng & Units 05/17/2014 06/05/2015 08/28/2016 12/08/2016 01/21/2017   Cholestrol 100 - 199 mg/dL 209(H) 217(H) 222(H) 206(H) 89(L)   LDLCALC 0 - 99 mg/dL 150(H) 144(H) 157(H) 144(H) 48   LDLDIRECT mg/dL - - - - -   HDL >39 mg/dL 39.20 49.40 44.40 44 31(L)   Trlycerides 0 - 149 mg/dL 99.0 117.0 103.0 92 50      Capillary Blood Glucose: No results found for: GLUCAP   Exercise Target Goals:    Exercise Program Goal: Individual exercise prescription set with THRR, safety & activity barriers. Participant demonstrates ability to  understand and report RPE using BORG scale, to self-measure pulse accurately, and to acknowledge the importance of the exercise prescription.  Exercise Prescription Goal: Starting with aerobic activity 30 plus minutes a day, 3 days per week for initial exercise prescription. Provide home exercise prescription and guidelines that participant acknowledges understanding prior to discharge.  Activity Barriers & Risk Stratification:     Activity Barriers & Cardiac Risk Stratification - 01/16/17 1556      Activity Barriers & Cardiac Risk Stratification   Activity Barriers None   Cardiac Risk Stratification High      6 Minute Walk:     6 Minute Walk    Row Name  01/16/17 1450         6 Minute Walk   Phase Initial     Distance 1615 feet     Walk Time 6 minutes     # of Rest Breaks 0     MPH 3.1     METS 3     RPE 9     VO2 Peak 10.4     Symptoms No     Resting HR 65 bpm     Resting BP 124/80     Max Ex. HR 83 bpm     Max Ex. BP 100/70     2 Minute Post BP 118/60        Oxygen Initial Assessment:   Oxygen Re-Evaluation:   Oxygen Discharge (Final Oxygen Re-Evaluation):   Initial Exercise Prescription:     Initial Exercise Prescription - 01/16/17 1400      Date of Initial Exercise RX and Referring Provider   Date 01/16/17   Referring Provider Sherren Mocha MD     Treadmill   MPH 2.5   Grade 0   Minutes 10   METs 3     Bike   Level 0.8   Minutes 10   METs 3     NuStep   Level 3   SPM 85   Minutes 10   METs 3     Prescription Details   Frequency (times per week) 3   Duration Progress to 45 minutes of aerobic exercise without signs/symptoms of physical distress     Intensity   THRR 40-80% of Max Heartrate 59-118   Ratings of Perceived Exertion 11-13   Perceived Dyspnea 0-4     Progression   Progression Continue to progress workloads to maintain intensity without signs/symptoms of physical distress.     Resistance Training   Training Prescription Yes   Weight 3   Reps 10-15      Perform Capillary Blood Glucose checks as needed.  Exercise Prescription Changes:   Exercise Comments:   Exercise Goals and Review:      Exercise Goals    Row Name 01/16/17 1420             Exercise Goals   Increase Physical Activity Yes       Intervention Provide advice, education, support and counseling about physical activity/exercise needs.;Develop an individualized exercise prescription for aerobic and resistive training based on initial evaluation findings, risk stratification, comorbidities and participant's personal goals.       Expected Outcomes Achievement of increased cardiorespiratory fitness and  enhanced flexibility, muscular endurance and strength shown through measurements of functional capacity and personal statement of participant.       Increase Strength and Stamina Yes       Intervention Provide advice, education, support and counseling about physical activity/exercise needs.;Develop an individualized  exercise prescription for aerobic and resistive training based on initial evaluation findings, risk stratification, comorbidities and participant's personal goals.       Expected Outcomes Achievement of increased cardiorespiratory fitness and enhanced flexibility, muscular endurance and strength shown through measurements of functional capacity and personal statement of participant.          Exercise Goals Re-Evaluation :    Discharge Exercise Prescription (Final Exercise Prescription Changes):   Nutrition:  Target Goals: Understanding of nutrition guidelines, daily intake of sodium 1500mg , cholesterol 200mg , calories 30% from fat and 7% or less from saturated fats, daily to have 5 or more servings of fruits and vegetables.  Biometrics:     Pre Biometrics - 01/16/17 1547      Pre Biometrics   Height 6' (1.829 m)   Weight 214 lb 1.1 oz (97.1 kg)   Waist Circumference 43.25 inches   Hip Circumference 43.75 inches   Waist to Hip Ratio 0.99 %   BMI (Calculated) 29.1   Triceps Skinfold 26 mm   % Body Fat 31.6 %   Grip Strength 51 kg   Flexibility 10 in   Single Leg Stand 30 seconds       Nutrition Therapy Plan and Nutrition Goals:   Nutrition Discharge: Nutrition Scores:   Nutrition Goals Re-Evaluation:   Nutrition Goals Re-Evaluation:   Nutrition Goals Discharge (Final Nutrition Goals Re-Evaluation):   Psychosocial: Target Goals: Acknowledge presence or absence of significant depression and/or stress, maximize coping skills, provide positive support system. Participant is able to verbalize types and ability to use techniques and skills needed for  reducing stress and depression.  Initial Review & Psychosocial Screening:     Initial Psych Review & Screening - 01/16/17 1544      Initial Review   Current issues with None Identified     Family Dynamics   Good Support System? Yes     Barriers   Psychosocial barriers to participate in program There are no identifiable barriers or psychosocial needs.     Screening Interventions   Interventions Encouraged to exercise      Quality of Life Scores:     Quality of Life - 01/16/17 1503      Quality of Life Scores   Health/Function Pre 23.1 %   Socioeconomic Pre 25.93 %   Psych/Spiritual Pre 25.71 %   Family Pre 28.8 %   GLOBAL Pre 25.06 %      PHQ-9: Recent Review Flowsheet Data    Depression screen [facility] 2/9 01/20/2017 08/28/2016 06/05/2015 05/17/2014 05/13/2013   Decreased Interest 0 0 0 0 0   Down, Depressed, Hopeless 0 0 0 0 0   PHQ - 2 Score 0 0 0 0 0     Interpretation of Total Score  Total Score Depression Severity:  1-4 = Minimal depression, 5-9 = Mild depression, 10-14 = Moderate depression, 15-19 = Moderately severe depression, 20-27 = Severe depression   Psychosocial Evaluation and Intervention:   Psychosocial Re-Evaluation:   Psychosocial Discharge (Final Psychosocial Re-Evaluation):   Vocational Rehabilitation: Provide vocational rehab assistance to qualifying candidates.   Vocational Rehab Evaluation & Intervention:     Vocational Rehab - 01/16/17 1544      Initial Vocational Rehab Evaluation & Intervention   Assessment shows need for Vocational Rehabilitation No      Education: Education Goals: Education classes will be provided on a weekly basis, covering required topics. Participant will state understanding/return demonstration of topics presented.  Learning Barriers/Preferences:  Learning Barriers/Preferences - 01/16/17 1543      Learning Barriers/Preferences   Learning Barriers None   Learning Preferences Skilled  Demonstration;Verbal Instruction;Written Material;Audio      Education Topics: Count Your Pulse:  -Group instruction provided by verbal instruction, demonstration, patient participation and written materials to support subject.  Instructors address importance of being able to find your pulse and how to count your pulse when at home without a heart monitor.  Patients get hands on experience counting their pulse with staff help and individually.   Heart Attack, Angina, and Risk Factor Modification:  -Group instruction provided by verbal instruction, video, and written materials to support subject.  Instructors address signs and symptoms of angina and heart attacks.    Also discuss risk factors for heart disease and how to make changes to improve heart health risk factors.   Functional Fitness:  -Group instruction provided by verbal instruction, demonstration, patient participation, and written materials to support subject.  Instructors address safety measures for doing things around the house.  Discuss how to get up and down off the floor, how to pick things up properly, how to safely get out of a chair without assistance, and balance training.   Meditation and Mindfulness:  -Group instruction provided by verbal instruction, patient participation, and written materials to support subject.  Instructor addresses importance of mindfulness and meditation practice to help reduce stress and improve awareness.  Instructor also leads participants through a meditation exercise.    Stretching for Flexibility and Mobility:  -Group instruction provided by verbal instruction, patient participation, and written materials to support subject.  Instructors lead participants through series of stretches that are designed to increase flexibility thus improving mobility.  These stretches are additional exercise for major muscle groups that are typically performed during regular warm up and cool down.   Hands Only  CPR:  -Group verbal, video, and participation provides a basic overview of AHA guidelines for community CPR. Role-play of emergencies allow participants the opportunity to practice calling for help and chest compression technique with discussion of AED use.   Hypertension: -Group verbal and written instruction that provides a basic overview of hypertension including the most recent diagnostic guidelines, risk factor reduction with self-care instructions and medication management.    Nutrition I class: Heart Healthy Eating:  -Group instruction provided by PowerPoint slides, verbal discussion, and written materials to support subject matter. The instructor gives an explanation and review of the Therapeutic Lifestyle Changes diet recommendations, which includes a discussion on lipid goals, dietary fat, sodium, fiber, plant stanol/sterol esters, sugar, and the components of a well-balanced, healthy diet.   Nutrition II class: Lifestyle Skills:  -Group instruction provided by PowerPoint slides, verbal discussion, and written materials to support subject matter. The instructor gives an explanation and review of label reading, grocery shopping for heart health, heart healthy recipe modifications, and ways to make healthier choices when eating out.   Diabetes Question & Answer:  -Group instruction provided by PowerPoint slides, verbal discussion, and written materials to support subject matter. The instructor gives an explanation and review of diabetes co-morbidities, pre- and post-prandial blood glucose goals, pre-exercise blood glucose goals, signs, symptoms, and treatment of hypoglycemia and hyperglycemia, and foot care basics.   Diabetes Blitz:  -Group instruction provided by PowerPoint slides, verbal discussion, and written materials to support subject matter. The instructor gives an explanation and review of the physiology behind type 1 and type 2 diabetes, diabetes medications and rational  behind using different medications, pre- and  post-prandial blood glucose recommendations and Hemoglobin A1c goals, diabetes diet, and exercise including blood glucose guidelines for exercising safely.    Portion Distortion:  -Group instruction provided by PowerPoint slides, verbal discussion, written materials, and food models to support subject matter. The instructor gives an explanation of serving size versus portion size, changes in portions sizes over the last 20 years, and what consists of a serving from each food group.   Stress Management:  -Group instruction provided by verbal instruction, video, and written materials to support subject matter.  Instructors review role of stress in heart disease and how to cope with stress positively.     Exercising on Your Own:  -Group instruction provided by verbal instruction, power point, and written materials to support subject.  Instructors discuss benefits of exercise, components of exercise, frequency and intensity of exercise, and end points for exercise.  Also discuss use of nitroglycerin and activating EMS.  Review options of places to exercise outside of rehab.  Review guidelines for sex with heart disease.   Cardiac Drugs I:  -Group instruction provided by verbal instruction and written materials to support subject.  Instructor reviews cardiac drug classes: antiplatelets, anticoagulants, beta blockers, and statins.  Instructor discusses reasons, side effects, and lifestyle considerations for each drug class.   Cardiac Drugs II:  -Group instruction provided by verbal instruction and written materials to support subject.  Instructor reviews cardiac drug classes: angiotensin converting enzyme inhibitors (ACE-I), angiotensin II receptor blockers (ARBs), nitrates, and calcium channel blockers.  Instructor discusses reasons, side effects, and lifestyle considerations for each drug class.   Anatomy and Physiology of the Circulatory System:   Group verbal and written instruction and models provide basic cardiac anatomy and physiology, with the coronary electrical and arterial systems. Review of: AMI, Angina, Valve disease, Heart Failure, Peripheral Artery Disease, Cardiac Arrhythmia, Pacemakers, and the ICD.   Other Education:  -Group or individual verbal, written, or video instructions that support the educational goals of the cardiac rehab program.   Knowledge Questionnaire Score:     Knowledge Questionnaire Score - 01/16/17 1419      Knowledge Questionnaire Score   Pre Score 19/24      Core Components/Risk Factors/Patient Goals at Admission:     Personal Goals and Risk Factors at Admission - 01/16/17 1606      Core Components/Risk Factors/Patient Goals on Admission    Weight Management Yes;Weight Loss   Intervention Weight Management: Develop a combined nutrition and exercise program designed to reach desired caloric intake, while maintaining appropriate intake of nutrient and fiber, sodium and fats, and appropriate energy expenditure required for the weight goal.   Expected Outcomes Short Term: Continue to assess and modify interventions until short term weight is achieved;Long Term: Adherence to nutrition and physical activity/exercise program aimed toward attainment of established weight goal;Weight Maintenance: Understanding of the daily nutrition guidelines, which includes 25-35% calories from fat, 7% or less cal from saturated fats, less than 200mg  cholesterol, less than 1.5gm of sodium, & 5 or more servings of fruits and vegetables daily;Weight Loss: Understanding of general recommendations for a balanced deficit meal plan, which promotes 1-2 lb weight loss per week and includes a negative energy balance of [phone] kcal/d;Understanding recommendations for meals to include 15-35% energy as protein, 25-35% energy from fat, 35-60% energy from carbohydrates, less than 200mg  of dietary cholesterol, 20-35 gm of total fiber  daily;Understanding of distribution of calorie intake throughout the day with the consumption of 4-5 meals/snacks   Lipids Yes  Intervention Provide education and support for participant on nutrition & aerobic/resistive exercise along with prescribed medications to achieve LDL 70mg , HDL >40mg .   Expected Outcomes Short Term: Participant states understanding of desired cholesterol values and is compliant with medications prescribed. Participant is following exercise prescription and nutrition guidelines.;Long Term: Cholesterol controlled with medications as prescribed, with individualized exercise RX and with personalized nutrition plan. Value goals: LDL < 70mg , HDL > 40 mg.   Personal Goal Other Yes   Personal Goal To loose 20 lbs      Core Components/Risk Factors/Patient Goals Review:    Core Components/Risk Factors/Patient Goals at Discharge (Final Review):    ITP Comments:     ITP Comments    Row Name 01/16/17 1420 01/16/17 1422         ITP Comments Fransico Him MD Fransico Him MD, Medical Director         Comments: Ronalee Belts is off to a good start after completing 2 sessions.Barnet Pall, RN,BSN 01/22/2017 4:45 PM

## 2017-01-20 NOTE — Progress Notes (Signed)
Daily Session Note  Patient Details  Name: Ryan [name] MRN: [id number] Date of Birth: September 28, 1945 Referring Provider:     CARDIAC REHAB PHASE II ORIENTATION from 01/16/2017 in [place]  Referring Provider  Sherren Mocha MD      Encounter Date: 01/20/2017  Check In:   Capillary Blood Glucose: No results found for this or any previous visit (from the past 24 hour(s)).    History  Smoking Status  . Never Smoker  Smokeless Tobacco  . Never Used    Goals Met:  Exercise tolerated well  Goals Unmet:  Not Applicable  Comments: Ryan [name] started cardiac rehab today.  Pt tolerated light exercise without difficulty. VSS, telemetry-Sinus Rhyhtm, asymptomatic.  Medication list reconciled. Pt denies barriers to medicaiton compliance.  PSYCHOSOCIAL ASSESSMENT:  PHQ-0. Pt exhibits positive coping skills, hopeful outlook with supportive family. No psychosocial needs identified at this time, no psychosocial interventions necessary.    Pt enjoys golfing and doing home repairs.   Pt oriented to exercise equipment and routine.    Understanding verbalized. Barnet Pall, RN,BSN 01/20/2017 1:53 PM   Dr. Fransico Him is Medical Director for Cardiac Rehab at [facility]

## 2017-01-21 ENCOUNTER — Other Ambulatory Visit: Payer: Medicare Other | Admitting: *Deleted

## 2017-01-21 DIAGNOSIS — R072 Precordial pain: Secondary | ICD-10-CM | POA: Diagnosis not present

## 2017-01-21 DIAGNOSIS — I213 ST elevation (STEMI) myocardial infarction of unspecified site: Secondary | ICD-10-CM

## 2017-01-21 DIAGNOSIS — Z955 Presence of coronary angioplasty implant and graft: Secondary | ICD-10-CM

## 2017-01-21 LAB — HEPATIC FUNCTION PANEL
ALK PHOS: 120 IU/L — AB (ref 39–117)
ALT: 38 IU/L (ref 0–44)
AST: 22 IU/L (ref 0–40)
Albumin: 4.1 g/dL (ref 3.5–4.8)
BILIRUBIN, DIRECT: 0.21 mg/dL (ref 0.00–0.40)
Bilirubin Total: 0.8 mg/dL (ref 0.0–1.2)
TOTAL PROTEIN: 6.3 g/dL (ref 6.0–8.5)

## 2017-01-21 LAB — LIPID PANEL
Chol/HDL Ratio: 2.9 ratio (ref 0.0–5.0)
Cholesterol, Total: 89 mg/dL — ABNORMAL LOW (ref 100–199)
HDL: 31 mg/dL — AB (ref >39)
LDL Calculated: 48 mg/dL (ref 0–99)
Triglycerides: 50 mg/dL (ref 0–149)
VLDL Cholesterol Cal: 10 mg/dL (ref 5–40)

## 2017-01-22 ENCOUNTER — Encounter (HOSPITAL_COMMUNITY)
Admission: RE | Admit: 2017-01-22 | Discharge: 2017-01-22 | Disposition: A | Discharge status: Home / Self Care | Payer: Medicare Other | Source: Ambulatory Visit | Attending: Cardiovascular Disease | Admitting: Cardiovascular Disease

## 2017-01-22 DIAGNOSIS — Z955 Presence of coronary angioplasty implant and graft: Secondary | ICD-10-CM

## 2017-01-22 DIAGNOSIS — I213 ST elevation (STEMI) myocardial infarction of unspecified site: Secondary | ICD-10-CM | POA: Diagnosis not present

## 2017-01-22 DIAGNOSIS — I2121 ST elevation (STEMI) myocardial infarction involving left circumflex coronary artery: Secondary | ICD-10-CM

## 2017-01-24 ENCOUNTER — Encounter (HOSPITAL_COMMUNITY)
Admission: RE | Admit: 2017-01-24 | Discharge: 2017-01-24 | Disposition: A | Discharge status: Home / Self Care | Payer: Medicare Other | Source: Ambulatory Visit | Attending: Cardiovascular Disease | Admitting: Cardiovascular Disease

## 2017-01-24 DIAGNOSIS — Z955 Presence of coronary angioplasty implant and graft: Secondary | ICD-10-CM

## 2017-01-24 DIAGNOSIS — I2121 ST elevation (STEMI) myocardial infarction involving left circumflex coronary artery: Secondary | ICD-10-CM

## 2017-01-24 DIAGNOSIS — I213 ST elevation (STEMI) myocardial infarction of unspecified site: Secondary | ICD-10-CM | POA: Diagnosis not present

## 2017-01-24 NOTE — Progress Notes (Signed)
Ryan [name] 71 y.o. male       Nutrition Screen & Note  1. ST elevation myocardial infarction involving left circumflex coronary artery ([name])   2. S/P drug eluting coronary stent placement    Past Medical History:  Diagnosis Date  . Allergy   . Anxiety   . Arthritis   . Asthma   . Cataract   . Coronary artery disease 12/08/2016   STEMI, DES Circumflex  . Hyperlipidemia   . Hypothyroidism   . STEMI (ST elevation myocardial infarction) ([place]) 12/08/2016   Meds reviewed  HT: Ht Readings from Last 1 Encounters:  01/16/17 6' (1.829 m)    WT: Wt Readings from Last 3 Encounters:  01/16/17 214 lb 1.1 oz (97.1 kg)  01/16/17 214 lb (97.1 kg)  12/24/16 216 lb 12.8 oz (98.3 kg)     BMI 29.1   Current tobacco use? No  Labs:  Lipid Panel     Component Value Date/Time   CHOL 89 (L) 01/21/2017 0904   TRIG 50 01/21/2017 0904   HDL 31 (L) 01/21/2017 0904   CHOLHDL 2.9 01/21/2017 0904   CHOLHDL 4.7 12/08/2016 2029   VLDL 18 12/08/2016 2029   LDLCALC 48 01/21/2017 0904   LDLDIRECT 144.0 05/13/2013 1036   No results found for: HGBA1C CBG (last 3)  No results for input(s): GLUCAP in the last 72 hours.  Nutrition Diagnosis ? Food-and nutrition-related knowledge deficit related to lack of exposure to information as related to diagnosis of: ? CVD ? Overweight related to excessive energy intake as evidenced by a BMI of 29.1  Nutrition Goal(s):  ? Pt to identify food quantities necessary to achieve weight loss of 6-20 lb at graduation from cardiac rehab.  Plan:  Pt to attend nutrition classes ? Nutrition I ? Nutrition II ? Portion Distortion Will provide client-centered nutrition education as part of interdisciplinary care.   Monitor and evaluate progress toward nutrition goal with team.  Ryan [name], M.Ed, RD, LDN, CDE 01/24/2017 11:06 AM

## 2017-01-27 ENCOUNTER — Encounter (HOSPITAL_COMMUNITY)
Admission: RE | Admit: 2017-01-27 | Discharge: 2017-01-27 | Disposition: A | Discharge status: Home / Self Care | Payer: Medicare Other | Source: Ambulatory Visit | Attending: Cardiovascular Disease | Admitting: Cardiovascular Disease

## 2017-01-27 DIAGNOSIS — Z955 Presence of coronary angioplasty implant and graft: Secondary | ICD-10-CM

## 2017-01-27 DIAGNOSIS — I2121 ST elevation (STEMI) myocardial infarction involving left circumflex coronary artery: Secondary | ICD-10-CM

## 2017-01-27 DIAGNOSIS — I213 ST elevation (STEMI) myocardial infarction of unspecified site: Secondary | ICD-10-CM | POA: Diagnosis not present

## 2017-01-29 ENCOUNTER — Encounter (HOSPITAL_COMMUNITY)
Admission: RE | Admit: 2017-01-29 | Discharge: 2017-01-29 | Disposition: A | Discharge status: Home / Self Care | Payer: Medicare Other | Source: Ambulatory Visit | Attending: Cardiovascular Disease | Admitting: Cardiovascular Disease

## 2017-01-29 DIAGNOSIS — I2121 ST elevation (STEMI) myocardial infarction involving left circumflex coronary artery: Secondary | ICD-10-CM

## 2017-01-29 DIAGNOSIS — I213 ST elevation (STEMI) myocardial infarction of unspecified site: Secondary | ICD-10-CM | POA: Diagnosis not present

## 2017-01-29 DIAGNOSIS — Z955 Presence of coronary angioplasty implant and graft: Secondary | ICD-10-CM

## 2017-01-31 ENCOUNTER — Encounter (HOSPITAL_COMMUNITY)
Admission: RE | Admit: 2017-01-31 | Discharge: 2017-01-31 | Disposition: A | Discharge status: Home / Self Care | Payer: Medicare Other | Source: Ambulatory Visit | Attending: Cardiovascular Disease | Admitting: Cardiovascular Disease

## 2017-01-31 DIAGNOSIS — I213 ST elevation (STEMI) myocardial infarction of unspecified site: Secondary | ICD-10-CM | POA: Diagnosis not present

## 2017-01-31 DIAGNOSIS — I2121 ST elevation (STEMI) myocardial infarction involving left circumflex coronary artery: Secondary | ICD-10-CM

## 2017-01-31 DIAGNOSIS — Z955 Presence of coronary angioplasty implant and graft: Secondary | ICD-10-CM | POA: Diagnosis not present

## 2017-02-03 ENCOUNTER — Encounter (HOSPITAL_COMMUNITY)
Admission: RE | Admit: 2017-02-03 | Discharge: 2017-02-03 | Disposition: A | Discharge status: Home / Self Care | Payer: Medicare Other | Source: Ambulatory Visit | Attending: Cardiovascular Disease | Admitting: Cardiovascular Disease

## 2017-02-03 DIAGNOSIS — I2121 ST elevation (STEMI) myocardial infarction involving left circumflex coronary artery: Secondary | ICD-10-CM

## 2017-02-03 DIAGNOSIS — I213 ST elevation (STEMI) myocardial infarction of unspecified site: Secondary | ICD-10-CM | POA: Diagnosis not present

## 2017-02-03 DIAGNOSIS — Z955 Presence of coronary angioplasty implant and graft: Secondary | ICD-10-CM | POA: Insufficient documentation

## 2017-02-03 NOTE — Progress Notes (Signed)
Reviewed home exercise program with pt.  Discussed mode and frequency of exercise, target heart rate range and weather conditions for exercising outdoors.  Also discussed signs and symptoms and when to call Dr./911.  Pt verbalized understanding.  Cleda Mccreedy, [place] ACSM RCEP 02/03/2017 [phone]

## 2017-02-06 ENCOUNTER — Telehealth: Payer: Self-pay | Admitting: Cardiovascular Disease

## 2017-02-06 NOTE — Telephone Encounter (Signed)
I spoke with the pt and he has developed a chest cold and he has a supply of Hydromet at home that he uses for chest congestion.  The pt contacted the office to determine if he can take this medication.  I advised the pt that he can use this medication and should follow instructions as directed on bottle. The pt also requested that his follow-up appointment be moved up to the beginning of August because he will be traveling to [place] at the end of August.  Appointment moved up to 03/06/17.

## 2017-02-06 NOTE — Telephone Encounter (Signed)
New message    Pt is having chest congestion and has a few medications he would like to talk about possibly taking to help solve this issue. Requests call back.

## 2017-02-07 ENCOUNTER — Ambulatory Visit (INDEPENDENT_AMBULATORY_CARE_PROVIDER_SITE_OTHER): Payer: Medicare Other | Admitting: Adult Health

## 2017-02-07 ENCOUNTER — Telehealth (HOSPITAL_COMMUNITY): Payer: Self-pay | Admitting: Family Medicine

## 2017-02-07 ENCOUNTER — Encounter (HOSPITAL_COMMUNITY): Payer: Medicare Other

## 2017-02-07 ENCOUNTER — Encounter: Payer: Self-pay | Admitting: Adult Health

## 2017-02-07 VITALS — BP 118/60 | Temp 98.3°F | Ht 72.0 in | Wt 212.6 lb

## 2017-02-07 DIAGNOSIS — R059 Cough, unspecified: Secondary | ICD-10-CM

## 2017-02-07 DIAGNOSIS — R05 Cough: Secondary | ICD-10-CM | POA: Diagnosis not present

## 2017-02-07 DIAGNOSIS — I2121 ST elevation (STEMI) myocardial infarction involving left circumflex coronary artery: Secondary | ICD-10-CM

## 2017-02-07 MED ORDER — HYDROCODONE-HOMATROPINE 5-1.5 MG/5ML PO SYRP: 5.0000 mL | ORAL_SOLUTION | Freq: Three times a day (TID) | ORAL | 0 refills | Status: DC | PRN

## 2017-02-07 NOTE — Progress Notes (Signed)
Subjective:    Patient ID: Ryan [name], male    DOB: 08/07/1945, 71 y.o.   MRN: [id number]  HPI  71 year old male who  has a past medical history of Allergy; Anxiety; Arthritis; Asthma; Cataract; Coronary artery disease (12/08/2016); Hyperlipidemia; Hypothyroidism; and STEMI (ST elevation myocardial infarction) ([place]) (12/08/2016). he presents to the office today for two days of sore throat and dry cough with chest congestion.   He denies any fevers, n/v/d/ or feeling acutely ill.   He has not been using anything over the counter    Review of Systems See HPI   Past Medical History:  Diagnosis Date  . Allergy   . Anxiety   . Arthritis   . Asthma   . Cataract   . Coronary artery disease 12/08/2016   STEMI, DES Circumflex  . Hyperlipidemia   . Hypothyroidism   . STEMI (ST elevation myocardial infarction) ([place]) 12/08/2016    Social History   Social History  . Marital status: Married    Spouse name: N/A  . Number of children: 2  . Years of education: N/A   Occupational History  . retired    Social History Main Topics  . Smoking status: Never Smoker  . Smokeless tobacco: Never Used  . Alcohol use Yes     Comment: less than 1 per day  . Drug use: No  . Sexual activity: Not on file   Other Topics Concern  . Not on file   Social History Narrative  . No narrative on file    Past Surgical History:  Procedure Laterality Date  . CARDIAC CATHETERIZATION  12/08/2016   S/P DES CFX  . cataract Bilateral 03/2016  . COLONOSCOPY  10.27.2005  . LEFT HEART CATH AND CORONARY ANGIOGRAPHY N/A 12/08/2016   Procedure: Left Heart Cath and Coronary Angiography;  Surgeon: Sherren Mocha, MD;  Location: [facility];  Service: Cardiovascular;  Laterality: N/A;  . SHOULDER SURGERY Right   . TONSILLECTOMY      Family History  Problem Relation Age of Onset  . Diabetes Mother   . Heart failure Mother   . Irritable bowel syndrome Mother   . Diverticulosis Mother   .  Lung cancer Father   . Heart disease Sister   . Irritable bowel syndrome Sister   . Diverticulosis Sister   . Colon cancer Neg Hx   . Esophageal cancer Neg Hx   . Pancreatic cancer Neg Hx   . Stomach cancer Neg Hx     Allergies  Allergen Reactions  . Contrast Media [Iodinated Diagnostic Agents] Shortness Of Breath and Swelling    Swelling of the throat   . Erythromycin Nausea And Vomiting  . Niacin And Related Swelling  . Penicillins Hives    Has patient had a PCN reaction causing immediate rash, facial/tongue/throat swelling, SOB or lightheadedness with hypotension: Y Has patient had a PCN reaction causing severe rash involving mucus membranes or skin necrosis: No Has patient had a PCN reaction that required hospitalization: No Has patient had a PCN reaction occurring within the last 10 years: No If all of the above answers are "NO", then may proceed with Cephalosporin use.    Current Outpatient Prescriptions on File Prior to Visit  Medication Sig Dispense Refill  . acetaminophen (TYLENOL) 325 MG tablet Take 325-650 mg by mouth every 6 (six) hours as needed for mild pain or moderate pain.    Marland Kitchen albuterol (PROVENTIL HFA;VENTOLIN HFA) 108 (90 Base) MCG/ACT inhaler  Inhale 2 puffs into the lungs every 6 (six) hours as needed for wheezing. 1 Inhaler 1  . aspirin EC 81 MG EC tablet Take 1 tablet (81 mg total) by mouth daily. 30 tablet 11  . atorvastatin (LIPITOR) 80 MG tablet Take 1 tablet (80 mg total) by mouth daily at 6 PM. 90 tablet 3  . bisacodyl (DULCOLAX) 5 MG EC tablet Take 5 mg by mouth daily as needed for moderate constipation.    . Cholecalciferol (VITAMIN D3) 5000 units CAPS Take 1 capsule by mouth every morning.    Marland Kitchen FISH OIL-KRILL OIL PO Take 1 tablet by mouth daily.    . nitroGLYCERIN (NITROSTAT) 0.4 MG SL tablet Place 1 tablet (0.4 mg total) under the tongue every 5 (five) minutes x 3 doses as needed for chest pain. 12 tablet 12  . pantoprazole (PROTONIX) 40 MG tablet Take  1 tablet (40 mg total) by mouth daily. 90 tablet 3  . SYNTHROID 88 MCG tablet Take 1 tablet (88 mcg total) by mouth daily. 90 tablet 1  . tamsulosin (FLOMAX) 0.4 MG CAPS capsule Take 1 capsule (0.4 mg total) by mouth daily. 90 capsule 1  . ticagrelor (BRILINTA) 90 MG TABS tablet Take 1 tablet (90 mg total) by mouth 2 (two) times daily. 180 tablet 3   No current facility-administered medications on file prior to visit.     BP 118/60 (BP Location: Left Arm, Patient Position: Sitting, Cuff Size: Normal)   Temp 98.3 F (36.8 C) (Oral)   Ht 6' (1.829 m)   Wt 212 lb 9.6 oz (96.4 kg)   BMI 28.83 kg/m       Objective:   Physical Exam  Constitutional: He is oriented to person, place, and time. He appears well-developed and well-nourished. No distress.  Cardiovascular: Normal rate, regular rhythm, normal heart sounds and intact distal pulses.  Exam reveals no gallop and no friction rub.   No murmur heard. Pulmonary/Chest: Effort normal and breath sounds normal. No respiratory distress. He has no wheezes. He has no rales. He exhibits no tenderness.  Neurological: He is alert and oriented to person, place, and time.  Skin: Skin is warm and dry. No rash noted. He is not diaphoretic. No erythema. No pallor.  Psychiatric: He has a normal mood and affect. His behavior is normal. Judgment and thought content normal.  Nursing note and vitals reviewed.      Assessment & Plan:  1. Cough - No signs of bacterial infection. Likely viral in nature. Will prescribe hycodan cough syrup.  - HYDROcodone-homatropine (HYCODAN) 5-1.5 MG/5ML syrup; Take 5 mLs by mouth every 8 (eight) hours as needed for cough.  Dispense: 120 mL; Refill: 0 - Follow up if no improvement in the next 2-3 days   Dorothyann Peng, NP

## 2017-02-10 ENCOUNTER — Encounter (HOSPITAL_COMMUNITY)
Admission: RE | Admit: 2017-02-10 | Discharge: 2017-02-10 | Disposition: A | Discharge status: Home / Self Care | Payer: Medicare Other | Source: Ambulatory Visit | Attending: Cardiovascular Disease | Admitting: Cardiovascular Disease

## 2017-02-10 DIAGNOSIS — I213 ST elevation (STEMI) myocardial infarction of unspecified site: Secondary | ICD-10-CM | POA: Diagnosis not present

## 2017-02-10 DIAGNOSIS — I2121 ST elevation (STEMI) myocardial infarction involving left circumflex coronary artery: Secondary | ICD-10-CM

## 2017-02-10 DIAGNOSIS — Z955 Presence of coronary angioplasty implant and graft: Secondary | ICD-10-CM

## 2017-02-12 ENCOUNTER — Encounter (HOSPITAL_COMMUNITY)
Admission: RE | Admit: 2017-02-12 | Discharge: 2017-02-12 | Disposition: A | Discharge status: Home / Self Care | Payer: Medicare Other | Source: Ambulatory Visit | Attending: Cardiovascular Disease | Admitting: Cardiovascular Disease

## 2017-02-12 DIAGNOSIS — Z955 Presence of coronary angioplasty implant and graft: Secondary | ICD-10-CM

## 2017-02-12 DIAGNOSIS — I2121 ST elevation (STEMI) myocardial infarction involving left circumflex coronary artery: Secondary | ICD-10-CM

## 2017-02-12 DIAGNOSIS — I213 ST elevation (STEMI) myocardial infarction of unspecified site: Secondary | ICD-10-CM | POA: Diagnosis not present

## 2017-02-14 ENCOUNTER — Encounter (HOSPITAL_COMMUNITY)
Admission: RE | Admit: 2017-02-14 | Discharge: 2017-02-14 | Disposition: A | Discharge status: Home / Self Care | Payer: Medicare Other | Source: Ambulatory Visit | Attending: Cardiovascular Disease | Admitting: Cardiovascular Disease

## 2017-02-14 DIAGNOSIS — I213 ST elevation (STEMI) myocardial infarction of unspecified site: Secondary | ICD-10-CM | POA: Diagnosis not present

## 2017-02-14 DIAGNOSIS — Z955 Presence of coronary angioplasty implant and graft: Secondary | ICD-10-CM | POA: Diagnosis not present

## 2017-02-14 DIAGNOSIS — I2121 ST elevation (STEMI) myocardial infarction involving left circumflex coronary artery: Secondary | ICD-10-CM

## 2017-02-17 ENCOUNTER — Encounter (HOSPITAL_COMMUNITY)
Admission: RE | Admit: 2017-02-17 | Discharge: 2017-02-17 | Disposition: A | Discharge status: Home / Self Care | Payer: Medicare Other | Source: Ambulatory Visit | Attending: Cardiovascular Disease | Admitting: Cardiovascular Disease

## 2017-02-17 DIAGNOSIS — Z955 Presence of coronary angioplasty implant and graft: Secondary | ICD-10-CM | POA: Diagnosis not present

## 2017-02-17 DIAGNOSIS — I2121 ST elevation (STEMI) myocardial infarction involving left circumflex coronary artery: Secondary | ICD-10-CM

## 2017-02-17 DIAGNOSIS — I213 ST elevation (STEMI) myocardial infarction of unspecified site: Secondary | ICD-10-CM | POA: Diagnosis not present

## 2017-02-17 NOTE — Progress Notes (Signed)
Ryan [name] 71 y.o. male       Nutrition Note Spoke with pt.  Nutrition Survey reviewed with pt. Pt is following Step 2 of the Therapeutic Lifestyle Changes diet. Pt expressed understanding of the information reviewed. Pt aware of nutrition education classes offered and is unable to attend nutrition classes at this time.  Nutrition Diagnosis ? Food-and nutrition-related knowledge deficit related to lack of exposure to information as related to diagnosis of: ? CVD ? Overweight related to excessive energy intake as evidenced by a BMI of 29.1  Nutrition Goal(s):  ? Pt to identify food quantities necessary to achieve weight loss of 6-20 lb at graduation from cardiac rehab. Goal wt reportedly 190 lb.  Nutrition Intervention ? Benefits of adopting Therapeutic Lifestyle Changes discussed when Medficts reviewed. ? Pt to attend the Portion Distortion class ? Pt given handouts for: ? Nutrition I class  Plan:  Will provide client-centered nutrition education as part of interdisciplinary care.   Monitor and evaluate progress toward nutrition goal with team.  Derek Mound, M.Ed, RD, LDN, CDE 02/17/2017 11:21 AM

## 2017-02-17 NOTE — Progress Notes (Signed)
Cardiac Individual Treatment Plan  Patient Details  Name: Ryan [name] MRN: [id number] Date of Birth: 1945-09-09 Referring Provider:     CARDIAC REHAB PHASE II ORIENTATION from 01/16/2017 in [place]  Referring Provider  Sherren Mocha MD      Initial Encounter Date:    CARDIAC REHAB PHASE II ORIENTATION from 01/16/2017 in [place]  Date  01/16/17  Referring Provider  Sherren Mocha MD      Visit Diagnosis: S/P drug eluting coronary stent placement  ST elevation myocardial infarction involving left circumflex coronary artery ([place])  Patient's Home Medications on Admission:  Current Outpatient Prescriptions:  .  acetaminophen (TYLENOL) 325 MG tablet, Take 325-650 mg by mouth every 6 (six) hours as needed for mild pain or moderate pain., Disp: , Rfl:  .  albuterol (PROVENTIL HFA;VENTOLIN HFA) 108 (90 Base) MCG/ACT inhaler, Inhale 2 puffs into the lungs every 6 (six) hours as needed for wheezing., Disp: 1 Inhaler, Rfl: 1 .  aspirin EC 81 MG EC tablet, Take 1 tablet (81 mg total) by mouth daily., Disp: 30 tablet, Rfl: 11 .  atorvastatin (LIPITOR) 80 MG tablet, Take 1 tablet (80 mg total) by mouth daily at 6 PM., Disp: 90 tablet, Rfl: 3 .  bisacodyl (DULCOLAX) 5 MG EC tablet, Take 5 mg by mouth daily as needed for moderate constipation., Disp: , Rfl:  .  Cholecalciferol (VITAMIN D3) 5000 units CAPS, Take 1 capsule by mouth every morning., Disp: , Rfl:  .  FISH OIL-KRILL OIL PO, Take 1 tablet by mouth daily., Disp: , Rfl:  .  HYDROcodone-homatropine (HYCODAN) 5-1.5 MG/5ML syrup, Take 5 mLs by mouth every 8 (eight) hours as needed for cough., Disp: 120 mL, Rfl: 0 .  nitroGLYCERIN (NITROSTAT) 0.4 MG SL tablet, Place 1 tablet (0.4 mg total) under the tongue every 5 (five) minutes x 3 doses as needed for chest pain., Disp: 12 tablet, Rfl: 12 .  pantoprazole (PROTONIX) 40 MG tablet, Take 1 tablet (40 mg total) by mouth  daily., Disp: 90 tablet, Rfl: 3 .  SYNTHROID 88 MCG tablet, Take 1 tablet (88 mcg total) by mouth daily., Disp: 90 tablet, Rfl: 1 .  tamsulosin (FLOMAX) 0.4 MG CAPS capsule, Take 1 capsule (0.4 mg total) by mouth daily., Disp: 90 capsule, Rfl: 1 .  ticagrelor (BRILINTA) 90 MG TABS tablet, Take 1 tablet (90 mg total) by mouth 2 (two) times daily., Disp: 180 tablet, Rfl: 3  Past Medical History: Past Medical History:  Diagnosis Date  . Allergy   . Anxiety   . Arthritis   . Asthma   . Cataract   . Coronary artery disease 12/08/2016   STEMI, DES Circumflex  . Hyperlipidemia   . Hypothyroidism   . STEMI (ST elevation myocardial infarction) ([place]) 12/08/2016    Tobacco Use: History  Smoking Status  . Never Smoker  Smokeless Tobacco  . Never Used    Labs: Recent Review Flowsheet Data    Labs for ITP Cardiac and Pulmonary Rehab Latest Ref Rng & Units 05/17/2014 06/05/2015 08/28/2016 12/08/2016 01/21/2017   Cholestrol 100 - 199 mg/dL 209(H) 217(H) 222(H) 206(H) 89(L)   LDLCALC 0 - 99 mg/dL 150(H) 144(H) 157(H) 144(H) 48   LDLDIRECT mg/dL - - - - -   HDL >39 mg/dL 39.20 49.40 44.40 44 31(L)   Trlycerides 0 - 149 mg/dL 99.0 117.0 103.0 92 50      Capillary Blood Glucose: No results found for: GLUCAP  Exercise Target Goals:    Exercise Program Goal: Individual exercise prescription set with THRR, safety & activity barriers. Participant demonstrates ability to understand and report RPE using BORG scale, to self-measure pulse accurately, and to acknowledge the importance of the exercise prescription.  Exercise Prescription Goal: Starting with aerobic activity 30 plus minutes a day, 3 days per week for initial exercise prescription. Provide home exercise prescription and guidelines that participant acknowledges understanding prior to discharge.  Activity Barriers & Risk Stratification:     Activity Barriers & Cardiac Risk Stratification - 01/16/17 1556      Activity Barriers &  Cardiac Risk Stratification   Activity Barriers None   Cardiac Risk Stratification High      6 Minute Walk:     6 Minute Walk    Row Name 01/16/17 1450         6 Minute Walk   Phase Initial     Distance 1615 feet     Walk Time 6 minutes     # of Rest Breaks 0     MPH 3.1     METS 3     RPE 9     VO2 Peak 10.4     Symptoms No     Resting HR 65 bpm     Resting BP 124/80     Max Ex. HR 83 bpm     Max Ex. BP 100/70     2 Minute Post BP 118/60        Oxygen Initial Assessment:   Oxygen Re-Evaluation:   Oxygen Discharge (Final Oxygen Re-Evaluation):   Initial Exercise Prescription:     Initial Exercise Prescription - 01/16/17 1400      Date of Initial Exercise RX and Referring Provider   Date 01/16/17   Referring Provider Sherren Mocha MD     Treadmill   MPH 2.5   Grade 0   Minutes 10   METs 3     Bike   Level 0.8   Minutes 10   METs 3     NuStep   Level 3   SPM 85   Minutes 10   METs 3     Prescription Details   Frequency (times per week) 3   Duration Progress to 45 minutes of aerobic exercise without signs/symptoms of physical distress     Intensity   THRR 40-80% of Max Heartrate 59-118   Ratings of Perceived Exertion 11-13   Perceived Dyspnea 0-4     Progression   Progression Continue to progress workloads to maintain intensity without signs/symptoms of physical distress.     Resistance Training   Training Prescription Yes   Weight 3   Reps 10-15      Perform Capillary Blood Glucose checks as needed.  Exercise Prescription Changes:     Exercise Prescription Changes    Row Name 02/13/17 1400             Response to Exercise   Blood Pressure (Admit) 110/68       Blood Pressure (Exercise) 122/80       Blood Pressure (Exit) 120/80       Heart Rate (Admit) 72 bpm       Heart Rate (Exercise) 99 bpm       Heart Rate (Exit) 58 bpm       Rating of Perceived Exertion (Exercise) 12       Duration Progress to 45 minutes of  aerobic exercise without signs/symptoms of physical distress  Intensity THRR unchanged         Progression   Progression Continue to progress workloads to maintain intensity without signs/symptoms of physical distress.       Average METs 3.5         Resistance Training   Training Prescription Yes       Weight 5lb       Reps 10-15         Treadmill   MPH 2.9       Grade 0       Minutes 10       METs 3.22         Bike   Level 1       Minutes 10       METs 3         NuStep   Level 4       SPM 85       Minutes 10       METs 4.3         Home Exercise Plan   Plans to continue exercise at Home (comment)       Frequency Add 4 additional days to program exercise sessions.       Initial Home Exercises Provided 02/10/17          Exercise Comments:     Exercise Comments    Row Name 02/13/17 1447           Exercise Comments Reviewed home exercise program with pt.           Exercise Goals and Review:     Exercise Goals    Row Name 01/16/17 1420             Exercise Goals   Increase Physical Activity Yes       Intervention Provide advice, education, support and counseling about physical activity/exercise needs.;Develop an individualized exercise prescription for aerobic and resistive training based on initial evaluation findings, risk stratification, comorbidities and participant's personal goals.       Expected Outcomes Achievement of increased cardiorespiratory fitness and enhanced flexibility, muscular endurance and strength shown through measurements of functional capacity and personal statement of participant.       Increase Strength and Stamina Yes       Intervention Provide advice, education, support and counseling about physical activity/exercise needs.;Develop an individualized exercise prescription for aerobic and resistive training based on initial evaluation findings, risk stratification, comorbidities and participant's personal goals.       Expected  Outcomes Achievement of increased cardiorespiratory fitness and enhanced flexibility, muscular endurance and strength shown through measurements of functional capacity and personal statement of participant.          Exercise Goals Re-Evaluation :     Exercise Goals Re-Evaluation    [place] Name 02/13/17 1445             Exercise Goal Re-Evaluation   Exercise Goals Review Increase Physical Activity;Increase Strenth and Stamina       Comments Pt is doing well with exercise in CR.  He says he is doing some wlaking and stretching at home 5xs/week       Expected Outcomes Continue walking and stretching in CR and gradually increase workloads in CR in order to increase strength and stamina           Discharge Exercise Prescription (Final Exercise Prescription Changes):     Exercise Prescription Changes - 02/13/17 1400      Response to Exercise  Blood Pressure (Admit) 110/68   Blood Pressure (Exercise) 122/80   Blood Pressure (Exit) 120/80   Heart Rate (Admit) 72 bpm   Heart Rate (Exercise) 99 bpm   Heart Rate (Exit) 58 bpm   Rating of Perceived Exertion (Exercise) 12   Duration Progress to 45 minutes of aerobic exercise without signs/symptoms of physical distress   Intensity THRR unchanged     Progression   Progression Continue to progress workloads to maintain intensity without signs/symptoms of physical distress.   Average METs 3.5     Resistance Training   Training Prescription Yes   Weight 5lb   Reps 10-15     Treadmill   MPH 2.9   Grade 0   Minutes 10   METs 3.22     Bike   Level 1   Minutes 10   METs 3     NuStep   Level 4   SPM 85   Minutes 10   METs 4.3     Home Exercise Plan   Plans to continue exercise at Home (comment)   Frequency Add 4 additional days to program exercise sessions.   Initial Home Exercises Provided 02/10/17      Nutrition:  Target Goals: Understanding of nutrition guidelines, daily intake of sodium 1500mg , cholesterol 200mg ,  calories 30% from fat and 7% or less from saturated fats, daily to have 5 or more servings of fruits and vegetables.  Biometrics:     Pre Biometrics - 01/16/17 1547      Pre Biometrics   Height 6' (1.829 m)   Weight 214 lb 1.1 oz (97.1 kg)   Waist Circumference 43.25 inches   Hip Circumference 43.75 inches   Waist to Hip Ratio 0.99 %   BMI (Calculated) 29.1   Triceps Skinfold 26 mm   % Body Fat 31.6 %   Grip Strength 51 kg   Flexibility 10 in   Single Leg Stand 30 seconds       Nutrition Therapy Plan and Nutrition Goals:     Nutrition Therapy & Goals - 01/24/17 1110      Nutrition Therapy   Diet Therapeutic Lifestyle Changes     Personal Nutrition Goals   Nutrition Goal Pt to identify food quantities necessary to achieve weight loss of 6-20 lb at graduation from cardiac rehab.     Intervention Plan   Intervention Prescribe, educate and counsel regarding individualized specific dietary modifications aiming towards targeted core components such as weight, hypertension, lipid management, diabetes, heart failure and other comorbidities.   Expected Outcomes Short Term Goal: Understand basic principles of dietary content, such as calories, fat, sodium, cholesterol and nutrients.;Long Term Goal: Adherence to prescribed nutrition plan.      Nutrition Discharge: Nutrition Scores:     Nutrition Assessments - 02/17/17 1117      MEDFICTS Scores   Pre Score 22      Nutrition Goals Re-Evaluation:     Nutrition Goals Re-Evaluation    Row Name 02/17/17 1125             Goals   Current Weight 213 lb 3 oz (96.7 kg)       Nutrition Goal Pt to identify food quantities necessary to achieve weight loss of 6-20 lb at graduation from cardiac rehab. Goal wt of 190 lb desired.       Comment Pt wt is down 2 lb since admission.          Nutrition Goals Re-Evaluation:  Nutrition Goals Re-Evaluation    Row Name 02/17/17 1125             Goals   Current Weight 213 lb  3 oz (96.7 kg)       Nutrition Goal Pt to identify food quantities necessary to achieve weight loss of 6-20 lb at graduation from cardiac rehab. Goal wt of 190 lb desired.       Comment Pt wt is down 2 lb since admission.          Nutrition Goals Discharge (Final Nutrition Goals Re-Evaluation):     Nutrition Goals Re-Evaluation - 02/17/17 1125      Goals   Current Weight 213 lb 3 oz (96.7 kg)   Nutrition Goal Pt to identify food quantities necessary to achieve weight loss of 6-20 lb at graduation from cardiac rehab. Goal wt of 190 lb desired.   Comment Pt wt is down 2 lb since admission.      Psychosocial: Target Goals: Acknowledge presence or absence of significant depression and/or stress, maximize coping skills, provide positive support system. Participant is able to verbalize types and ability to use techniques and skills needed for reducing stress and depression.  Initial Review & Psychosocial Screening:     Initial Psych Review & Screening - 01/16/17 1544      Initial Review   Current issues with None Identified     Family Dynamics   Good Support System? Yes     Barriers   Psychosocial barriers to participate in program There are no identifiable barriers or psychosocial needs.     Screening Interventions   Interventions Encouraged to exercise      Quality of Life Scores:     Quality of Life - 01/16/17 1503      Quality of Life Scores   Health/Function Pre 23.1 %   Socioeconomic Pre 25.93 %   Psych/Spiritual Pre 25.71 %   Family Pre 28.8 %   GLOBAL Pre 25.06 %      PHQ-9: Recent Review Flowsheet Data    Depression screen [facility] 2/9 01/20/2017 08/28/2016 06/05/2015 05/17/2014 05/13/2013   Decreased Interest 0 0 0 0 0   Down, Depressed, Hopeless 0 0 0 0 0   PHQ - 2 Score 0 0 0 0 0     Interpretation of Total Score  Total Score Depression Severity:  1-4 = Minimal depression, 5-9 = Mild depression, 10-14 = Moderate depression, 15-19 = Moderately severe  depression, 20-27 = Severe depression   Psychosocial Evaluation and Intervention:   Psychosocial Re-Evaluation:     Psychosocial Re-Evaluation    [place] Name 02/17/17 1339             Psychosocial Re-Evaluation   Current issues with None Identified       Interventions Encouraged to attend Cardiac Rehabilitation for the exercise       Continue Psychosocial Services  No Follow up required          Psychosocial Discharge (Final Psychosocial Re-Evaluation):     Psychosocial Re-Evaluation - 02/17/17 1339      Psychosocial Re-Evaluation   Current issues with None Identified   Interventions Encouraged to attend Cardiac Rehabilitation for the exercise   Continue Psychosocial Services  No Follow up required      Vocational Rehabilitation: Provide vocational rehab assistance to qualifying candidates.   Vocational Rehab Evaluation & Intervention:     Vocational Rehab - 01/16/17 1544      Initial Vocational Rehab Evaluation & Intervention  Assessment shows need for Vocational Rehabilitation No      Education: Education Goals: Education classes will be provided on a weekly basis, covering required topics. Participant will state understanding/return demonstration of topics presented.  Learning Barriers/Preferences:     Learning Barriers/Preferences - 01/16/17 1543      Learning Barriers/Preferences   Learning Barriers None   Learning Preferences Skilled Demonstration;Verbal Instruction;Written Material;Audio      Education Topics: Count Your Pulse:  -Group instruction provided by verbal instruction, demonstration, patient participation and written materials to support subject.  Instructors address importance of being able to find your pulse and how to count your pulse when at home without a heart monitor.  Patients get hands on experience counting their pulse with staff help and individually.   Heart Attack, Angina, and Risk Factor Modification:  -Group instruction  provided by verbal instruction, video, and written materials to support subject.  Instructors address signs and symptoms of angina and heart attacks.    Also discuss risk factors for heart disease and how to make changes to improve heart health risk factors.   Functional Fitness:  -Group instruction provided by verbal instruction, demonstration, patient participation, and written materials to support subject.  Instructors address safety measures for doing things around the house.  Discuss how to get up and down off the floor, how to pick things up properly, how to safely get out of a chair without assistance, and balance training.   Meditation and Mindfulness:  -Group instruction provided by verbal instruction, patient participation, and written materials to support subject.  Instructor addresses importance of mindfulness and meditation practice to help reduce stress and improve awareness.  Instructor also leads participants through a meditation exercise.    Stretching for Flexibility and Mobility:  -Group instruction provided by verbal instruction, patient participation, and written materials to support subject.  Instructors lead participants through series of stretches that are designed to increase flexibility thus improving mobility.  These stretches are additional exercise for major muscle groups that are typically performed during regular warm up and cool down.   Hands Only CPR:  -Group verbal, video, and participation provides a basic overview of AHA guidelines for community CPR. Role-play of emergencies allow participants the opportunity to practice calling for help and chest compression technique with discussion of AED use.   Hypertension: -Group verbal and written instruction that provides a basic overview of hypertension including the most recent diagnostic guidelines, risk factor reduction with self-care instructions and medication management.    Nutrition I class: Heart Healthy  Eating:  -Group instruction provided by PowerPoint slides, verbal discussion, and written materials to support subject matter. The instructor gives an explanation and review of the Therapeutic Lifestyle Changes diet recommendations, which includes a discussion on lipid goals, dietary fat, sodium, fiber, plant stanol/sterol esters, sugar, and the components of a well-balanced, healthy diet.   CARDIAC REHAB PHASE II EXERCISE from 02/17/2017 in [place]  Date  02/17/17  Educator  RD  Instruction Review Code  Not applicable      Nutrition II class: Lifestyle Skills:  -Group instruction provided by PowerPoint slides, verbal discussion, and written materials to support subject matter. The instructor gives an explanation and review of label reading, grocery shopping for heart health, heart healthy recipe modifications, and ways to make healthier choices when eating out.   CARDIAC REHAB PHASE II EXERCISE from 02/17/2017 in [place]  Date  02/17/17  Educator  RD  Instruction  Review Code  Not applicable      Diabetes Question & Answer:  -Group instruction provided by PowerPoint slides, verbal discussion, and written materials to support subject matter. The instructor gives an explanation and review of diabetes co-morbidities, pre- and post-prandial blood glucose goals, pre-exercise blood glucose goals, signs, symptoms, and treatment of hypoglycemia and hyperglycemia, and foot care basics.   Diabetes Blitz:  -Group instruction provided by PowerPoint slides, verbal discussion, and written materials to support subject matter. The instructor gives an explanation and review of the physiology behind type 1 and type 2 diabetes, diabetes medications and rational behind using different medications, pre- and post-prandial blood glucose recommendations and Hemoglobin A1c goals, diabetes diet, and exercise including blood glucose guidelines for  exercising safely.    Portion Distortion:  -Group instruction provided by PowerPoint slides, verbal discussion, written materials, and food models to support subject matter. The instructor gives an explanation of serving size versus portion size, changes in portions sizes over the last 20 years, and what consists of a serving from each food group.   Stress Management:  -Group instruction provided by verbal instruction, video, and written materials to support subject matter.  Instructors review role of stress in heart disease and how to cope with stress positively.     CARDIAC REHAB PHASE II EXERCISE from 02/17/2017 in [place]  Date  01/29/17  Instruction Review Code  2- meets goals/outcomes      Exercising on Your Own:  -Group instruction provided by verbal instruction, power point, and written materials to support subject.  Instructors discuss benefits of exercise, components of exercise, frequency and intensity of exercise, and end points for exercise.  Also discuss use of nitroglycerin and activating EMS.  Review options of places to exercise outside of rehab.  Review guidelines for sex with heart disease.   Cardiac Drugs I:  -Group instruction provided by verbal instruction and written materials to support subject.  Instructor reviews cardiac drug classes: antiplatelets, anticoagulants, beta blockers, and statins.  Instructor discusses reasons, side effects, and lifestyle considerations for each drug class.   Cardiac Drugs II:  -Group instruction provided by verbal instruction and written materials to support subject.  Instructor reviews cardiac drug classes: angiotensin converting enzyme inhibitors (ACE-I), angiotensin II receptor blockers (ARBs), nitrates, and calcium channel blockers.  Instructor discusses reasons, side effects, and lifestyle considerations for each drug class.   CARDIAC REHAB PHASE II EXERCISE from 02/17/2017 in [place]  Date  02/12/17  Instruction Review Code  2- meets goals/outcomes      Anatomy and Physiology of the Circulatory System:  Group verbal and written instruction and models provide basic cardiac anatomy and physiology, with the coronary electrical and arterial systems. Review of: AMI, Angina, Valve disease, Heart Failure, Peripheral Artery Disease, Cardiac Arrhythmia, Pacemakers, and the ICD.   Other Education:  -Group or individual verbal, written, or video instructions that support the educational goals of the cardiac rehab program.   Knowledge Questionnaire Score:     Knowledge Questionnaire Score - 01/16/17 1419      Knowledge Questionnaire Score   Pre Score 19/24      Core Components/Risk Factors/Patient Goals at Admission:     Personal Goals and Risk Factors at Admission - 02/17/17 1127      Core Components/Risk Factors/Patient Goals on Admission    Weight Management Yes;Weight Loss   Intervention Weight Management: Develop a combined nutrition and exercise program designed to reach  desired caloric intake, while maintaining appropriate intake of nutrient and fiber, sodium and fats, and appropriate energy expenditure required for the weight goal.   Admit Weight 214 lb (97.1 kg)   Goal Weight: Short Term 208 lb (94.3 kg)   Goal Weight: Long Term 190 lb (86.2 kg)   Expected Outcomes Short Term: Continue to assess and modify interventions until short term weight is achieved;Long Term: Adherence to nutrition and physical activity/exercise program aimed toward attainment of established weight goal;Weight Loss: Understanding of general recommendations for a balanced deficit meal plan, which promotes 1-2 lb weight loss per week and includes a negative energy balance of [phone] kcal/d;Understanding recommendations for meals to include 15-35% energy as protein, 25-35% energy from fat, 35-60% energy from carbohydrates, less than 200mg  of dietary cholesterol, 20-35  gm of total fiber daily;Understanding of distribution of calorie intake throughout the day with the consumption of 4-5 meals/snacks   Lipids Yes   Intervention Provide education and support for participant on nutrition & aerobic/resistive exercise along with prescribed medications to achieve LDL 70mg , HDL >40mg .   Expected Outcomes Short Term: Participant states understanding of desired cholesterol values and is compliant with medications prescribed. Participant is following exercise prescription and nutrition guidelines.;Long Term: Cholesterol controlled with medications as prescribed, with individualized exercise RX and with personalized nutrition plan. Value goals: LDL < 70mg , HDL > 40 mg.   Personal Goal Other Yes   Personal Goal To lose 20 lbs      Core Components/Risk Factors/Patient Goals Review:      Goals and Risk Factor Review    Row Name 02/17/17 1126             Core Components/Risk Factors/Patient Goals Review   Personal Goals Review Weight Management/Obesity       Review Pt has lost 2 lb from admission wt of 214 lb.        Expected Outcomes Continue to encourage slow wt loss of 1-2 lb/week.          Core Components/Risk Factors/Patient Goals at Discharge (Final Review):      Goals and Risk Factor Review - 02/17/17 1126      Core Components/Risk Factors/Patient Goals Review   Personal Goals Review Weight Management/Obesity   Review Pt has lost 2 lb from admission wt of 214 lb.    Expected Outcomes Continue to encourage slow wt loss of 1-2 lb/week.      ITP Comments:     ITP Comments    Row Name 01/16/17 1420 01/16/17 1422         ITP Comments Fransico Him MD Fransico Him MD, Medical Director         Comments: Ronalee Belts is making expected progress toward personal goals after completing 12 sessions. Recommend continued exercise and life style modification education including  stress management and relaxation techniques to decrease cardiac risk profile. Ronalee Belts  is doing well with exercise at cardiac rehab.Barnet Pall, RN,BSN 02/17/2017 1:42 PM

## 2017-02-19 ENCOUNTER — Encounter (HOSPITAL_COMMUNITY)
Admission: RE | Admit: 2017-02-19 | Discharge: 2017-02-19 | Disposition: A | Discharge status: Home / Self Care | Payer: Medicare Other | Source: Ambulatory Visit | Attending: Cardiovascular Disease | Admitting: Cardiovascular Disease

## 2017-02-19 DIAGNOSIS — I2121 ST elevation (STEMI) myocardial infarction involving left circumflex coronary artery: Secondary | ICD-10-CM

## 2017-02-19 DIAGNOSIS — Z955 Presence of coronary angioplasty implant and graft: Secondary | ICD-10-CM

## 2017-02-19 DIAGNOSIS — I213 ST elevation (STEMI) myocardial infarction of unspecified site: Secondary | ICD-10-CM | POA: Diagnosis not present

## 2017-02-21 ENCOUNTER — Encounter (HOSPITAL_COMMUNITY)
Admission: RE | Admit: 2017-02-21 | Discharge: 2017-02-21 | Disposition: A | Discharge status: Home / Self Care | Payer: Medicare Other | Source: Ambulatory Visit | Attending: Cardiovascular Disease | Admitting: Cardiovascular Disease

## 2017-02-21 DIAGNOSIS — Z955 Presence of coronary angioplasty implant and graft: Secondary | ICD-10-CM | POA: Diagnosis not present

## 2017-02-21 DIAGNOSIS — I213 ST elevation (STEMI) myocardial infarction of unspecified site: Secondary | ICD-10-CM | POA: Diagnosis not present

## 2017-02-21 DIAGNOSIS — I2121 ST elevation (STEMI) myocardial infarction involving left circumflex coronary artery: Secondary | ICD-10-CM

## 2017-02-24 ENCOUNTER — Encounter (HOSPITAL_COMMUNITY)
Admission: RE | Admit: 2017-02-24 | Discharge: 2017-02-24 | Disposition: A | Discharge status: Home / Self Care | Payer: Medicare Other | Source: Ambulatory Visit | Attending: Cardiovascular Disease | Admitting: Cardiovascular Disease

## 2017-02-24 DIAGNOSIS — I2121 ST elevation (STEMI) myocardial infarction involving left circumflex coronary artery: Secondary | ICD-10-CM

## 2017-02-24 DIAGNOSIS — Z955 Presence of coronary angioplasty implant and graft: Secondary | ICD-10-CM

## 2017-02-24 DIAGNOSIS — I213 ST elevation (STEMI) myocardial infarction of unspecified site: Secondary | ICD-10-CM | POA: Diagnosis not present

## 2017-02-26 ENCOUNTER — Encounter (HOSPITAL_COMMUNITY)
Admission: RE | Admit: 2017-02-26 | Discharge: 2017-02-26 | Disposition: A | Discharge status: Home / Self Care | Payer: Medicare Other | Source: Ambulatory Visit | Attending: Cardiovascular Disease | Admitting: Cardiovascular Disease

## 2017-02-26 DIAGNOSIS — I2121 ST elevation (STEMI) myocardial infarction involving left circumflex coronary artery: Secondary | ICD-10-CM

## 2017-02-26 DIAGNOSIS — Z955 Presence of coronary angioplasty implant and graft: Secondary | ICD-10-CM

## 2017-02-26 DIAGNOSIS — I213 ST elevation (STEMI) myocardial infarction of unspecified site: Secondary | ICD-10-CM | POA: Diagnosis not present

## 2017-02-28 ENCOUNTER — Encounter (HOSPITAL_COMMUNITY)
Admission: RE | Admit: 2017-02-28 | Discharge: 2017-02-28 | Disposition: A | Discharge status: Home / Self Care | Payer: Medicare Other | Source: Ambulatory Visit | Attending: Cardiovascular Disease | Admitting: Cardiovascular Disease

## 2017-02-28 DIAGNOSIS — Z955 Presence of coronary angioplasty implant and graft: Secondary | ICD-10-CM | POA: Diagnosis not present

## 2017-02-28 DIAGNOSIS — I213 ST elevation (STEMI) myocardial infarction of unspecified site: Secondary | ICD-10-CM | POA: Diagnosis not present

## 2017-03-03 ENCOUNTER — Encounter (HOSPITAL_COMMUNITY)
Admission: RE | Admit: 2017-03-03 | Discharge: 2017-03-03 | Disposition: A | Discharge status: Home / Self Care | Payer: Medicare Other | Source: Ambulatory Visit | Attending: Cardiovascular Disease | Admitting: Cardiovascular Disease

## 2017-03-03 DIAGNOSIS — Z955 Presence of coronary angioplasty implant and graft: Secondary | ICD-10-CM | POA: Diagnosis not present

## 2017-03-03 DIAGNOSIS — I213 ST elevation (STEMI) myocardial infarction of unspecified site: Secondary | ICD-10-CM | POA: Diagnosis not present

## 2017-03-03 DIAGNOSIS — I2121 ST elevation (STEMI) myocardial infarction involving left circumflex coronary artery: Secondary | ICD-10-CM

## 2017-03-05 ENCOUNTER — Emergency Department (HOSPITAL_COMMUNITY): Payer: Medicare Other

## 2017-03-05 ENCOUNTER — Emergency Department (HOSPITAL_COMMUNITY)
Admission: EM | Admit: 2017-03-05 | Discharge: 2017-03-05 | Disposition: A | Discharge status: Home / Self Care | Payer: Medicare Other | Attending: Emergency Medicine | Admitting: Emergency Medicine

## 2017-03-05 ENCOUNTER — Encounter (HOSPITAL_COMMUNITY)
Admission: RE | Admit: 2017-03-05 | Discharge: 2017-03-05 | Disposition: A | Discharge status: Home / Self Care | Payer: Medicare Other | Source: Ambulatory Visit | Attending: Cardiovascular Disease | Admitting: Cardiovascular Disease

## 2017-03-05 ENCOUNTER — Other Ambulatory Visit: Payer: Self-pay

## 2017-03-05 ENCOUNTER — Ambulatory Visit (HOSPITAL_COMMUNITY)
Admission: RE | Admit: 2017-03-05 | Discharge: 2017-03-05 | Disposition: A | Discharge status: Home / Self Care | Payer: Medicare Other | Source: Ambulatory Visit | Attending: Cardiovascular Disease | Admitting: Cardiovascular Disease

## 2017-03-05 DIAGNOSIS — I25119 Atherosclerotic heart disease of native coronary artery with unspecified angina pectoris: Secondary | ICD-10-CM | POA: Diagnosis not present

## 2017-03-05 DIAGNOSIS — I2121 ST elevation (STEMI) myocardial infarction involving left circumflex coronary artery: Secondary | ICD-10-CM

## 2017-03-05 DIAGNOSIS — I213 ST elevation (STEMI) myocardial infarction of unspecified site: Secondary | ICD-10-CM | POA: Diagnosis not present

## 2017-03-05 DIAGNOSIS — I214 Non-ST elevation (NSTEMI) myocardial infarction: Secondary | ICD-10-CM | POA: Insufficient documentation

## 2017-03-05 DIAGNOSIS — E782 Mixed hyperlipidemia: Secondary | ICD-10-CM

## 2017-03-05 DIAGNOSIS — J45909 Unspecified asthma, uncomplicated: Secondary | ICD-10-CM | POA: Diagnosis not present

## 2017-03-05 DIAGNOSIS — I10 Essential (primary) hypertension: Secondary | ICD-10-CM | POA: Diagnosis not present

## 2017-03-05 DIAGNOSIS — R072 Precordial pain: Secondary | ICD-10-CM

## 2017-03-05 DIAGNOSIS — I2510 Atherosclerotic heart disease of native coronary artery without angina pectoris: Secondary | ICD-10-CM | POA: Diagnosis not present

## 2017-03-05 DIAGNOSIS — Z79899 Other long term (current) drug therapy: Secondary | ICD-10-CM | POA: Insufficient documentation

## 2017-03-05 DIAGNOSIS — Z955 Presence of coronary angioplasty implant and graft: Secondary | ICD-10-CM | POA: Diagnosis not present

## 2017-03-05 DIAGNOSIS — R079 Chest pain, unspecified: Secondary | ICD-10-CM | POA: Diagnosis not present

## 2017-03-05 DIAGNOSIS — E039 Hypothyroidism, unspecified: Secondary | ICD-10-CM | POA: Diagnosis not present

## 2017-03-05 LAB — CBC
HCT: 40 % (ref 39.0–52.0)
HEMOGLOBIN: 13.3 g/dL (ref 13.0–17.0)
MCH: 30.9 pg (ref 26.0–34.0)
MCHC: 33.3 g/dL (ref 30.0–36.0)
MCV: 92.8 fL (ref 78.0–100.0)
PLATELETS: 284 10*3/uL (ref 150–400)
RBC: 4.31 MIL/uL (ref 4.22–5.81)
RDW: 12.8 % (ref 11.5–15.5)
WBC: 7.8 10*3/uL (ref 4.0–10.5)

## 2017-03-05 LAB — BASIC METABOLIC PANEL
ANION GAP: 8 (ref 5–15)
BUN: 18 mg/dL (ref 6–20)
CALCIUM: 9.5 mg/dL (ref 8.9–10.3)
CO2: 25 mmol/L (ref 22–32)
CREATININE: 0.92 mg/dL (ref 0.61–1.24)
Chloride: 105 mmol/L (ref 101–111)
GFR calc Af Amer: >60 mL/min (ref >60)
GLUCOSE: 99 mg/dL (ref 65–99)
Potassium: 4.2 mmol/L (ref 3.5–5.1)
Sodium: 138 mmol/L (ref 135–145)

## 2017-03-05 LAB — I-STAT TROPONIN, ED
TROPONIN I, POC: 0 ng/mL (ref 0.00–0.08)
TROPONIN I, POC: 0.01 ng/mL (ref 0.00–0.08)

## 2017-03-05 MED ORDER — ASPIRIN 81 MG PO CHEW
324.0000 mg | CHEWABLE_TABLET | Freq: Once | ORAL | Status: AC
  Administered 2017-03-05: 324 mg via ORAL
  Filled 2017-03-05: qty 4

## 2017-03-05 NOTE — ED Triage Notes (Addendum)
Pt arrives via wheelchair from cardiac rehab appt. Pt had STEMI, with stent placement in May 2018. Began having sharp left sided chest pain at 4am. Pt took 1 nitro with some relief of pain. Arrived to appt stating 2/10 chest pain. Pt brought to ED for further eval of pain before starting exercise. Pt awake, alert, oriented x4, VSS.

## 2017-03-05 NOTE — ED Provider Notes (Signed)
[facility] DEPT Provider Note   CSN: [id number] Arrival date & time: 03/05/17  1023     History   Chief Complaint Chief Complaint  Patient presents with  . Chest Pain    HPI Ryan [name] is a 71 y.o. male.  Ryan [name] is a 71 y.o. Male with a history of a STEMI with stent placement in May 2018 by Dr. Burt Knack who presents to the emergency department complaining of chest pain. Patient reports he awoke this morning around 4 AM and noticed left-sided and nonradiating chest pain. He reports he took acid reflux medication and a nitroglycerin without relief of his symptoms. He reports his chest pain spontaneously resolved around 6:30 this morning. He reports when walking into his cardiac rehabilitation facility today he had another episode of chest pain. He denies any current chest pain. He also tells me he's been having intermittent chest pain since his stent over the past several weeks. She is unable to identify alleviating or aggravating factors of his chest pain. He denies any associated shortness of breath or coughing. He denies fevers, coughing, shortness of breath, leg pain, leg swelling, numbness, tingling, weakness, lightheadedness, dizziness, syncope or rashes.   The history is provided by the patient, medical records and the spouse. No language interpreter was used.  Chest Pain   Pertinent negatives include no abdominal pain, no back pain, no cough, no dizziness, no fever, no headaches, no nausea, no numbness, no palpitations, no shortness of breath, no vomiting and no weakness.    Past Medical History:  Diagnosis Date  . Allergy   . Anxiety   . Arthritis   . Asthma   . Cataract   . Coronary artery disease 12/08/2016   STEMI, DES Circumflex  . Hyperlipidemia   . Hypothyroidism   . STEMI (ST elevation myocardial infarction) ([place]) 12/08/2016    Patient Active Problem List   Diagnosis Date Noted  . ACS (acute coronary syndrome) ([place]) 12/17/2016  . ST  elevation myocardial infarction (STEMI), subsequent episode of care ([place]) 12/17/2016  . CAD (coronary artery disease) 12/17/2016  . Midsternal chest pain 12/17/2016  . S/P cardiac cath: a. Promus DES to left circumflex, moderate diffuse LAD, RCA and ramus intermedius stenosis, Normal LV function 12/10/2016  . Hypercholesterolemia 12/10/2016  . Acute inferoposterior myocardial infarction ([place]) 12/08/2016  . STEMI involving left circumflex coronary artery ([place]) 12/08/2016  . History of skin cancer 08/28/2016  . BPH associated with nocturia 05/17/2014  . Hypothyroidism 03/11/2007    Past Surgical History:  Procedure Laterality Date  . CARDIAC CATHETERIZATION  12/08/2016   S/P DES CFX  . cataract Bilateral 03/2016  . COLONOSCOPY  10.27.2005  . LEFT HEART CATH AND CORONARY ANGIOGRAPHY N/A 12/08/2016   Procedure: Left Heart Cath and Coronary Angiography;  Surgeon: Sherren Mocha, MD;  Location: [facility];  Service: Cardiovascular;  Laterality: N/A;  . SHOULDER SURGERY Right   . TONSILLECTOMY         Home Medications    Prior to Admission medications   Medication Sig Start Date End Date Taking? Authorizing Provider  acetaminophen (TYLENOL) 325 MG tablet Take 325-650 mg by mouth every 6 (six) hours as needed for mild pain or moderate pain.   Yes [provider]  albuterol (PROVENTIL HFA;VENTOLIN HFA) 108 (90 Base) MCG/ACT inhaler Inhale 2 puffs into the lungs every 6 (six) hours as needed for wheezing. 08/28/16  Yes Dorena Cookey, MD  aspirin EC 81 MG EC tablet Take 1  tablet (81 mg total) by mouth daily. 12/11/16  Yes Carlota Raspberry, Tiffany, PA-C  atorvastatin (LIPITOR) 80 MG tablet Take 1 tablet (80 mg total) by mouth daily at 6 PM. 12/27/16  Yes Bhagat, Bhavinkumar, PA  bisacodyl (DULCOLAX) 5 MG EC tablet Take 5 mg by mouth daily as needed for moderate constipation.   Yes [provider]  Cholecalciferol (VITAMIN D3) 5000 units CAPS Take 1 capsule by mouth every  morning.   Yes [provider]  HYDROcodone-homatropine (HYCODAN) 5-1.5 MG/5ML syrup Take 5 mLs by mouth every 8 (eight) hours as needed for cough. 02/07/17  Yes Nafziger, Tommi Rumps, NP  nitroGLYCERIN (NITROSTAT) 0.4 MG SL tablet Place 1 tablet (0.4 mg total) under the tongue every 5 (five) minutes x 3 doses as needed for chest pain. 12/10/16  Yes Carlota Raspberry, Tiffany, PA-C  pantoprazole (PROTONIX) 40 MG tablet Take 1 tablet (40 mg total) by mouth daily. 12/27/16  Yes Bhagat, Bhavinkumar, PA  SYNTHROID 88 MCG tablet Take 1 tablet (88 mcg total) by mouth daily. 01/06/17  Yes Dorena Cookey, MD  tamsulosin (FLOMAX) 0.4 MG CAPS capsule Take 1 capsule (0.4 mg total) by mouth daily. 01/06/17  Yes Dorena Cookey, MD  ticagrelor (BRILINTA) 90 MG TABS tablet Take 1 tablet (90 mg total) by mouth 2 (two) times daily. 12/26/16  Yes Sherren Mocha, MD    Family History Family History  Problem Relation Age of Onset  . Diabetes Mother   . Heart failure Mother   . Irritable bowel syndrome Mother   . Diverticulosis Mother   . Lung cancer Father   . Heart disease Sister   . Irritable bowel syndrome Sister   . Diverticulosis Sister   . Colon cancer Neg Hx   . Esophageal cancer Neg Hx   . Pancreatic cancer Neg Hx   . Stomach cancer Neg Hx     Social History Social History  Substance Use Topics  . Smoking status: Never Smoker  . Smokeless tobacco: Never Used  . Alcohol use Yes     Comment: less than 1 per day     Allergies   Contrast media [iodinated diagnostic agents]; Erythromycin; Niacin and related; and Penicillins   Review of Systems Review of Systems  Constitutional: Negative for chills and fever.  HENT: Negative for congestion and sore throat.   Eyes: Negative for visual disturbance.  Respiratory: Negative for cough, shortness of breath and wheezing.   Cardiovascular: Positive for chest pain. Negative for palpitations and leg swelling.  Gastrointestinal: Negative for abdominal pain,  diarrhea, nausea and vomiting.  Genitourinary: Negative for dysuria.  Musculoskeletal: Negative for back pain and neck pain.  Skin: Negative for rash.  Neurological: Negative for dizziness, syncope, weakness, light-headedness, numbness and headaches.     Physical Exam Updated Vital Signs BP 133/76   Pulse (!) 49   Temp 99 F (37.2 C) (Oral)   Resp 18   Wt 96 kg (211 lb 10.3 oz)   SpO2 100%   BMI 28.70 kg/m   Physical Exam  Constitutional: He is oriented to person, place, and time. He appears well-developed and well-nourished. No distress.  Nontoxic appearing.  HENT:  Head: Normocephalic and atraumatic.  Mouth/Throat: Oropharynx is clear and moist.  Eyes: Pupils are equal, round, and reactive to light. Conjunctivae are normal. Right eye exhibits no discharge. Left eye exhibits no discharge.  Neck: Normal range of motion. Neck supple. No JVD present. No tracheal deviation present.  Cardiovascular: Regular rhythm, normal heart sounds and intact  distal pulses.  Exam reveals no gallop and no friction rub.   No murmur heard. Heart rate 54 on my exam. Bilateral radial, posterior tibialis pulses are intact.    Pulmonary/Chest: Effort normal and breath sounds normal. No stridor. No respiratory distress. He has no wheezes. He has no rales. He exhibits no tenderness.  Lungs are clear to ascultation bilaterally. Symmetric chest expansion bilaterally. No increased work of breathing. No rales or rhonchi.    Abdominal: Soft. There is no tenderness. There is no guarding.  Musculoskeletal: He exhibits no edema or tenderness.  No lower extremity edema or tenderness.  Lymphadenopathy:    He has no cervical adenopathy.  Neurological: He is alert and oriented to person, place, and time. Coordination normal.  Skin: Skin is warm and dry. Capillary refill takes less than 2 seconds. No rash noted. He is not diaphoretic. No erythema. No pallor.  Psychiatric: He has a normal mood and affect. His  behavior is normal.  Nursing note and vitals reviewed.    ED Treatments / Results  Labs (all labs ordered are listed, but only abnormal results are displayed) Labs Reviewed  BASIC METABOLIC PANEL  CBC  I-STAT TROPONIN, ED  I-STAT TROPONIN, ED    EKG  EKG Interpretation  Date/Time:  Wednesday March 05 2017 10:34:07 EDT Ventricular Rate:  55 PR Interval:  164 QRS Duration: 98 QT Interval:  432 QTC Calculation: 413 R Axis:   9 Text Interpretation:  Sinus bradycardia Otherwise normal ECG No significant change since last tracing Confirmed by Orlie Dakin [phone]) on 03/05/2017 4:13:39 PM       Radiology Dg Chest 2 View  Result Date: 03/05/2017 CLINICAL DATA:  Chest pain since last night. Some improvement today. History of coronary artery disease with previous MI EXAM: CHEST  2 VIEW COMPARISON:  Chest x-ray of Dec 16, 2016 FINDINGS: The lungs are mildly hyperinflated and clear. Linear density at the left lung base is not entirely new. There is no pleural effusion or pneumothorax. There is no alveolar infiltrate. The heart and pulmonary vascularity are normal. There is calcification in the wall of the aortic arch. The bony thorax exhibits no acute abnormality. IMPRESSION: Mild hyperinflation may be voluntary or may reflect underlying reactive airway disease. Probable subsegmental atelectasis or scarring at the left lung base. No acute pneumonia nor CHF. Thoracic aortic atherosclerosis. Electronically Signed   By: David  Martinique M.D.   On: 03/05/2017 11:11    Procedures Procedures (including critical care time)  Medications Ordered in ED Medications  aspirin chewable tablet 324 mg (324 mg Oral Given 03/05/17 1431)     Initial Impression / Assessment and Plan / ED Course  I have reviewed the triage vital signs and the nursing notes.  Pertinent labs & imaging results that were available during my care of the patient were reviewed by me and considered in my medical decision making  (see chart for details).     This is a 71 y.o. Male with a history of a STEMI with stent placement in May 2018 by Dr. Burt Knack who presents to the emergency department complaining of chest pain. Patient reports he awoke this morning around 4 AM and noticed left-sided and nonradiating chest pain. He reports he took acid reflux medication and a nitroglycerin without relief of his symptoms. He reports his chest pain spontaneously resolved around 6:30 this morning. He reports when walking into his cardiac rehabilitation facility today he had another episode of chest pain. He denies any current chest  pain. He also tells me he's been having intermittent chest pain since his stent over the past several weeks. She is unable to identify alleviating or aggravating factors of his chest pain. He denies any associated shortness of breath or coughing. On exam the patient is afebrile nontoxic appearing. Lungs are clear to auscultation bilaterally. EKG shows sinus bradycardia without STEMI. He is chest pain-free. No tachypnea, tachycardia or hypoxia on exam. Initial troponin is not elevated. BMP and CBC are within normal limits. Chest x-ray shows mild hyperinflation and subsegmental atelectasis. No acute pneumonia or CHF.  The patient's recent STEMI will consult cardiology. They agree to see the patient in consult. I will order an delta troponin at this time while awaiting cardiology consultation.  Cardiology was down to evaluate the patient. Delta troponin returned and is not elevated. They are reassured by his exam and report he can be discharged. He has follow-up with Dr. Burt Knack tomorrow. Strict and specific return precautions discussed with the patient. He ambulated without chest pain or shortness of breath in the emergency department. I discussed return precautions. I advised the patient to follow-up with their primary care provider this week. I advised the patient to return to the emergency department with new or  worsening symptoms or new concerns. The patient verbalized understanding and agreement with plan.     This patient was discussed with and evaluated by Dr. Winfred Leeds who agrees with assessment and plan.   Final Clinical Impressions(s) / ED Diagnoses   Final diagnoses:  Precordial pain  Essential hypertension    New Prescriptions New Prescriptions   No medications on file     Waynetta Pean, Hershal Coria 03/05/17 1615    Orlie Dakin, MD 03/05/17 [phone]

## 2017-03-05 NOTE — ED Provider Notes (Signed)
Patient had anterior chest pain typical of his angina upon awakening 4 AM today. He had slight discomfort upon arriving at cardiac rehabilitation this morning and sent here for further evaluation. He is presently asymptomatic and pain-free after treatment with one sublingual nitroglycerin prior to coming here   Orlie Dakin, MD 03/05/17 1407

## 2017-03-05 NOTE — ED Notes (Signed)
IV removed, patient getting dressed, ready to go with discharge

## 2017-03-05 NOTE — ED Notes (Signed)
Ambulated pt. Down hallway approximately 60 ft in length, "no chest pain, no SOB, no other issues, completely normal"

## 2017-03-05 NOTE — Progress Notes (Signed)
Incomplete Session Note  Patient Details  Name: Reagan Klemz MRN: [id number] Date of Birth: Mar 03, 1946 Referring Provider:     CARDIAC REHAB PHASE II ORIENTATION from 01/16/2017 in [facility]  Referring Provider  Sherren Mocha MD      Susa Loffler did not complete his rehab session.  Ronalee Belts reported that he experienced some chest pain in his left chest area. Ronalee Belts took Maalox and then a sublingual nitroglycerin. Blood pressure 114/70. O2 sat 97% on room air. Ronalee Belts said the pain went away around 6:30 am. Ronalee Belts said he is now experiencing chest discomfort of a 2/10 level. Ronalee Belts says the pain is non radiating. Cecilie Kicks NP called and notified. Mickel Baas talked with Ronalee Belts over the phone. 12 lead ECG completed. Mickel Baas reviewed the ECG and although the ECG showed no changes. Mickel Baas recommended that Ronalee Belts be taken to the ED for further evaluation. Patient taken to the ED via wheelchair on 2l/minute of oxygen on a portable monitor. Report given to ED RN. Patient contacted his wife and let her know of today's events.Barnet Pall, RN,BSN 03/07/2017 10:45 AM

## 2017-03-05 NOTE — Consult Note (Signed)
Cardiology Consult    Patient ID: Ryan [name] MRN: [id number], DOB/AGE: 1946-05-09   Admit date: 03/05/2017 Date of Consult: 03/05/2017  Primary Physician: Dorena Cookey, MD Primary Cardiologist: Burt Knack Requesting Provider: Winfred Leeds Reason for Consultation: chest pain  Ryan [name] is a 71 y.o. male who is being seen today for the evaluation of chest pain at the request of Dr. Winfred Leeds.   Patient Profile    71 yo male with PMH of CAD s/p STEMI with PCI/DES to Lcx (5/18), HTN, HL and hypothyroidism who presented with chest pain.   Past Medical History   Past Medical History:  Diagnosis Date  . Allergy   . Anxiety   . Arthritis   . Asthma   . Cataract   . Coronary artery disease 12/08/2016   STEMI, DES Circumflex  . Hyperlipidemia   . Hypothyroidism   . STEMI (ST elevation myocardial infarction) ([place]) 12/08/2016    Past Surgical History:  Procedure Laterality Date  . CARDIAC CATHETERIZATION  12/08/2016   S/P DES CFX  . cataract Bilateral 03/2016  . COLONOSCOPY  10.27.2005  . LEFT HEART CATH AND CORONARY ANGIOGRAPHY N/A 12/08/2016   Procedure: Left Heart Cath and Coronary Angiography;  Surgeon: Sherren Mocha, MD;  Location: [facility];  Service: Cardiovascular;  Laterality: N/A;  . SHOULDER SURGERY Right   . TONSILLECTOMY       Allergies  Allergies  Allergen Reactions  . Contrast Media [Iodinated Diagnostic Agents] Shortness Of Breath and Swelling    Swelling of the throat   . Erythromycin Nausea And Vomiting  . Niacin And Related Swelling  . Penicillins Hives    Has patient had a PCN reaction causing immediate rash, facial/tongue/throat swelling, SOB or lightheadedness with hypotension: Y Has patient had a PCN reaction causing severe rash involving mucus membranes or skin necrosis: No Has patient had a PCN reaction that required hospitalization: No Has patient had a PCN reaction occurring within the last 10 years: No If all of the above  answers are "NO", then may proceed with Cephalosporin use.    History of Present Illness    [name] is a 71 yo male with PMH of CAD s/p STEMI with PCI/DES to Lcx (5/18), HTN, HL and hypothyroidism. He was admitted in 5/18 with inferoposterior STEMI with LCX stenting. Discharged on DAPT with ASA/Brilinta. Reports overall he has been doing well post cath. Currently attending cardiac rehab.   Was in his usual state of health up until this morning. Reports he developed left sided chest tightness in upper chest around 4am. States he is not sure whether this woke him up, because he typically wakes up around 4am to go to the bathroom. Took a SL and attempted to lay back down, but symptoms persisted. Went and sat in his chair in the living room and was able to fall asleep. Woke up around 7am and took his medications. Still felt a slight discomfort in his chest, but presented to cardiac rehab for work out. There he informed staff about his symptoms and was referred to the ED.   In the ED his labs showed stable electrolytes, Hgb 13.3, Trop neg x2. CXR negative. EKG showed SR with no acute changes. No further episodes of chest pressure. In talking with the patient he did report a lot of repetitive motions with his arms regarding lifting weights, and pain was somewhat reproducible with movement.   Inpatient Medications      Family History  Family History  Problem Relation Age of Onset  . Diabetes Mother   . Heart failure Mother   . Irritable bowel syndrome Mother   . Diverticulosis Mother   . Lung cancer Father   . Heart disease Sister   . Irritable bowel syndrome Sister   . Diverticulosis Sister   . Colon cancer Neg Hx   . Esophageal cancer Neg Hx   . Pancreatic cancer Neg Hx   . Stomach cancer Neg Hx     Social History    Social History   Social History  . Marital status: Married    Spouse name: N/A  . Number of children: 2  . Years of education: N/A   Occupational History  .  retired    Social History Main Topics  . Smoking status: Never Smoker  . Smokeless tobacco: Never Used  . Alcohol use Yes     Comment: less than 1 per day  . Drug use: No  . Sexual activity: Not on file   Other Topics Concern  . Not on file   Social History Narrative  . No narrative on file     Review of Systems    See HPI  All other systems reviewed and are otherwise negative except as noted above.  Physical Exam    Blood pressure 134/77, pulse (!) 51, temperature 99 F (37.2 C), temperature source Oral, resp. rate 16, weight 211 lb 10.3 oz (96 kg), SpO2 100 %.  General: Pleasant, older W male, NAD Psych: Normal affect. Neuro: Alert and oriented X 3. Moves all extremities spontaneously. HEENT: Normal  Neck: Supple without bruits or JVD. Lungs:  Resp regular and unlabored, CTA. Heart: RRR no s3, s4, or murmurs. Abdomen: Soft, non-tender, non-distended, BS + x 4.  Extremities: No clubbing, cyanosis or edema. DP/PT/Radials 2+ and equal bilaterally.  Labs    Troponin [facility] Test)  Recent Labs  03/05/17 1435  TROPIPOC 0.01   No results for input(s): CKTOTAL, CKMB, TROPONINI in the last 72 hours. Lab Results  Component Value Date   WBC 7.8 03/05/2017   HGB 13.3 03/05/2017   HCT 40.0 03/05/2017   MCV 92.8 03/05/2017   PLT 284 03/05/2017    Recent Labs Lab 03/05/17 1044  NA 138  K 4.2  CL 105  CO2 25  BUN 18  CREATININE 0.92  CALCIUM 9.5  GLUCOSE 99   Lab Results  Component Value Date   CHOL 89 (L) 01/21/2017   HDL 31 (L) 01/21/2017   LDLCALC 48 01/21/2017   TRIG 50 01/21/2017   No results found for: [facility]   Radiology Studies    Dg Chest 2 View  Result Date: 03/05/2017 CLINICAL DATA:  Chest pain since last night. Some improvement today. History of coronary artery disease with previous MI EXAM: CHEST  2 VIEW COMPARISON:  Chest x-ray of Dec 16, 2016 FINDINGS: The lungs are mildly hyperinflated and clear. Linear density at the left lung  base is not entirely new. There is no pleural effusion or pneumothorax. There is no alveolar infiltrate. The heart and pulmonary vascularity are normal. There is calcification in the wall of the aortic arch. The bony thorax exhibits no acute abnormality. IMPRESSION: Mild hyperinflation may be voluntary or may reflect underlying reactive airway disease. Probable subsegmental atelectasis or scarring at the left lung base. No acute pneumonia nor CHF. Thoracic aortic atherosclerosis. Electronically Signed   By: David  Martinique M.D.   On: 03/05/2017 11:11  ECG & Cardiac Imaging    EKG: SB with no acute changes  Assessment & Plan    71 yo male with PMH of CAD s/p STEMI with PCI/DES to Lcx (5/18), HTN, HL and hypothyroidism who presented with chest pain.  Chest pain: Recent STEMI back in 5/18 with PCI/DES. Currently on DAPT, and reports being compliant. Symptoms are somewhat atypical and reproducible with movement. Not similar at all to what he experienced with STEMI back in May. EKG non-acute. Trop negative x2. Suspect this may be musculoskeletal in nature.  -- would ambulate, if no chest pain then ok to dc home. Has follow up arranged in the office with Dr. Burt Knack tomorrow. Precautions given if recurrent chest pain to return to the ED.   -- continue home medications.   HTN: Overall controlled with current therapy  HL: on high dose statin  Barnet Pall, NP-C Pager [phone] 03/05/2017, 3:22 PM   I have examined the patient and reviewed assessment and plan and discussed with patient.  Agree with above as stated.  Patient with atypical symptoms. Left-sided chest pain currently is clearly different from the pain he had with his heart attack. He thinks he was overexerting while lifting some weights at cardiac rehabilitation. Troponins are negative. ECG nonacute. He feels better. If he can walk in the emergency room without any chest discomfort, okay to discharge. He has follow-up with Dr.  Burt Knack tomorrow. They could consider an exercise treadmill test. He has been walking regularly without any problems up until doing these upper body exercises today.  Larae Grooms

## 2017-03-06 ENCOUNTER — Encounter: Payer: Self-pay | Admitting: Cardiovascular Disease

## 2017-03-06 ENCOUNTER — Ambulatory Visit (INDEPENDENT_AMBULATORY_CARE_PROVIDER_SITE_OTHER): Payer: Medicare Other | Admitting: Cardiovascular Disease

## 2017-03-06 VITALS — BP 110/74 | HR 62 | Ht 72.0 in | Wt 211.2 lb

## 2017-03-06 DIAGNOSIS — I2121 ST elevation (STEMI) myocardial infarction involving left circumflex coronary artery: Secondary | ICD-10-CM | POA: Diagnosis not present

## 2017-03-06 DIAGNOSIS — I25119 Atherosclerotic heart disease of native coronary artery with unspecified angina pectoris: Secondary | ICD-10-CM | POA: Diagnosis not present

## 2017-03-06 DIAGNOSIS — I209 Angina pectoris, unspecified: Secondary | ICD-10-CM

## 2017-03-06 NOTE — Patient Instructions (Signed)
Medication Instructions:  Your physician recommends that you continue on your current medications as directed. Please refer to the Current Medication list given to you today.  Labwork: No new orders.   Testing/Procedures: Your physician has requested that you have an exercise tolerance test in 3 MONTHS (1-2 WEEKS prior to PA/NP appointment). For further information please visit [email or web address]. Please also follow instruction sheet, as given.  Follow-Up: Your physician recommends that you schedule a follow-up appointment in: 3 MONTHS with PA/NP  Your physician wants you to follow-up in: 6 MONTHS with Dr Burt Knack. You will receive a reminder letter in the mail two months in advance. If you don't receive a letter, please call our office to schedule the follow-up appointment.   Any Other Special Instructions Will Be Listed Below (If Applicable).     If you need a refill on your cardiac medications before your next appointment, please call your pharmacy.

## 2017-03-06 NOTE — Progress Notes (Signed)
Cardiology Office Note Date:  03/07/2017   ID:  [name], DOB 1946-07-12, MRN [id number]  PCP:  Dorena Cookey, MD  Cardiologist:  Sherren Mocha, MD    Chief Complaint  Patient presents with  . Follow-up     History of Present Illness: Ryan [name] is a 71 y.o. male who presents for follow-up after ER evaluation yesterday.   The patient has coronary artery disease and initially presented with an inferoposterior STEMI in May 2018. He was treated with primary PCI. He had a recurrent episode of chest discomfort that was atypical and he was observed overnight with negative enzymes and a negative CT of the chest. He was again seen yesterday in the emergency room with chest wall pain and was reassured.  The patient developed discomfort in his chest at 4:00 in the morning yesterday. His symptoms persisted and he ultimately went to the emergency room for evaluation. He describes a dull pain located in the left chest that has not been exacerbated by physical exertion. There are no associated symptoms of shortness of breath, diaphoresis, nausea, lightheadedness, or heart palpitations. He has a difficult time distinguishing is different types of chest pain symptoms. He was seen in the emergency room and cardiac markers and EKG were within normal limits. His pain was reproducible on exam and the patient noted that he had done some lifting the day prior to his pain symptoms. He also has had some problems with acid reflux. He otherwise is doing well and is participating regularly in cardiac rehabilitation with no exertional chest discomfort or shortness of breath.   Past Medical History:  Diagnosis Date  . Allergy   . Anxiety   . Arthritis   . Asthma   . Cataract   . Coronary artery disease 12/08/2016   STEMI, DES Circumflex  . Hyperlipidemia   . Hypothyroidism   . STEMI (ST elevation myocardial infarction) ([place]) 12/08/2016    Past Surgical History:  Procedure Laterality Date    . CARDIAC CATHETERIZATION  12/08/2016   S/P DES CFX  . cataract Bilateral 03/2016  . COLONOSCOPY  10.27.2005  . LEFT HEART CATH AND CORONARY ANGIOGRAPHY N/A 12/08/2016   Procedure: Left Heart Cath and Coronary Angiography;  Surgeon: Sherren Mocha, MD;  Location: [facility];  Service: Cardiovascular;  Laterality: N/A;  . SHOULDER SURGERY Right   . TONSILLECTOMY      Current Outpatient Prescriptions  Medication Sig Dispense Refill  . acetaminophen (TYLENOL) 325 MG tablet Take 325-650 mg by mouth every 6 (six) hours as needed for mild pain or moderate pain.    Marland Kitchen albuterol (PROVENTIL HFA;VENTOLIN HFA) 108 (90 Base) MCG/ACT inhaler Inhale 2 puffs into the lungs every 6 (six) hours as needed for wheezing. 1 Inhaler 1  . aspirin EC 81 MG EC tablet Take 1 tablet (81 mg total) by mouth daily. 30 tablet 11  . atorvastatin (LIPITOR) 80 MG tablet Take 1 tablet (80 mg total) by mouth daily at 6 PM. 90 tablet 3  . bisacodyl (DULCOLAX) 5 MG EC tablet Take 5 mg by mouth daily as needed for moderate constipation.    . Cholecalciferol (VITAMIN D3) 5000 units CAPS Take 1 capsule by mouth every morning.    Marland Kitchen HYDROcodone-homatropine (HYCODAN) 5-1.5 MG/5ML syrup Take 5 mLs by mouth every 8 (eight) hours as needed for cough. 120 mL 0  . Melatonin 5 MG TABS Take 5 mg by mouth at bedtime as needed (sleep).    . nitroGLYCERIN (  NITROSTAT) 0.4 MG SL tablet Place 1 tablet (0.4 mg total) under the tongue every 5 (five) minutes x 3 doses as needed for chest pain. 12 tablet 12  . pantoprazole (PROTONIX) 40 MG tablet Take 1 tablet (40 mg total) by mouth daily. 90 tablet 3  . SYNTHROID 88 MCG tablet Take 1 tablet (88 mcg total) by mouth daily. 90 tablet 1  . tamsulosin (FLOMAX) 0.4 MG CAPS capsule Take 1 capsule (0.4 mg total) by mouth daily. 90 capsule 1  . ticagrelor (BRILINTA) 90 MG TABS tablet Take 1 tablet (90 mg total) by mouth 2 (two) times daily. 180 tablet 3   No current facility-administered medications  for this visit.     Allergies:   Contrast media [iodinated diagnostic agents]; Erythromycin; Niacin and related; and Penicillins   Social History:  The patient  reports that he has never smoked. He has never used smokeless tobacco. He reports that he drinks alcohol. He reports that he does not use drugs.   Family History:  The patient's  family history includes Diabetes in his mother; Diverticulosis in his mother and sister; Heart disease in his sister; Heart failure in his mother; Irritable bowel syndrome in his mother and sister; Lung cancer in his father.    ROS:  Please see the history of present illness.  Otherwise, review of systems is positive for generalized fatigue.  All other systems are reviewed and negative.    PHYSICAL EXAM: VS:  BP 110/74   Pulse 62   Ht 6' (1.829 m)   Wt 211 lb 3.2 oz (95.8 kg)   BMI 28.64 kg/m  , BMI Body mass index is 28.64 kg/m. GEN: Well nourished, well developed, in no acute distress  HEENT: normal  Neck: no JVD, no masses. No carotid bruits Cardiac: RRR without murmur or gallop                Respiratory:  clear to auscultation bilaterally, normal work of breathing GI: soft, nontender, nondistended, + BS MS: no deformity or atrophy  Ext: no pretibial edema, pedal pulses 2+= bilaterally Skin: warm and dry, no rash Neuro:  Strength and sensation are intact Psych: euthymic mood, full affect  EKG:  EKG is not ordered today.  Recent Labs: 08/28/2016: TSH 4.63 01/21/2017: ALT 38 03/05/2017: BUN 18; Creatinine, Ser 0.92; Hemoglobin 13.3; Platelets 284; Potassium 4.2; Sodium 138   Lipid Panel     Component Value Date/Time   CHOL 89 (L) 01/21/2017 0904   TRIG 50 01/21/2017 0904   HDL 31 (L) 01/21/2017 0904   CHOLHDL 2.9 01/21/2017 0904   CHOLHDL 4.7 12/08/2016 2029   VLDL 18 12/08/2016 2029   LDLCALC 48 01/21/2017 0904   LDLDIRECT 144.0 05/13/2013 1036      Wt Readings from Last 3 Encounters:  03/06/17 211 lb 3.2 oz (95.8 kg)  03/05/17  211 lb 10.3 oz (96 kg)  02/07/17 212 lb 9.6 oz (96.4 kg)     Cardiac Studies Reviewed: Cardiac Cath 12/08/2016: Conclusion   1. Severe thrombotic stenosis of the left circumflex treated successfully with Primary PCI using a 3.5x24 mm Promus DES 2. Moderate diffuse LAD, RCA, and ramus intermedius stenoses 3. Normal LV function  Recommend:  Aggressive medical therapy  The patient's residual CAD affects small caliber vessels and will be best managed medically  Aggrastat x 6 hours  DAPT with ASA and brilinta x 12 months without interruption  Anticipate discharge 48 hours if no complications  Indications   ST  elevation myocardial infarction involving left circumflex coronary artery (HCC) [I21.21 (ICD-10-CM)]  Procedural Details/Technique   Technical Details INDICATION: Acute inferoposterior STEMI - borderline ST changes but suggestive history and positive troponin. Initial EKG in the ED is NOT diagnostic, second EKG has suggestive changes.  PROCEDURAL DETAILS: The right wrist was prepped, draped, and anesthetized with 1% lidocaine. Using the modified Seldinger technique, a 5/6 French Slender sheath was introduced into the right radial artery. 3 mg of verapamil was administered through the sheath, weight-based unfractionated heparin was administered intravenously. Standard Judkins catheters were used for selective coronary angiography and left ventriculography. PCI is performed. Catheter exchanges were performed over an exchange length guidewire. There were no immediate procedural complications. A TR band was used for radial hemostasis at the completion of the procedure. The patient was transferred to the post catheterization recovery area for further monitoring.    Estimated blood loss <50 mL.  During this procedure the patient was administered the following to achieve and maintain moderate conscious sedation: Versed 2 mg, Fentanyl 25 mcg, while the patient's heart rate, blood pressure,  and oxygen saturation were continuously monitored. The period of conscious sedation was 38 minutes, of which I was present face-to-face 100% of this time.    Coronary Findings   Dominance: Right  Left Main  Vessel is angiographically normal.  Left Anterior Descending  Vessel is small.  Prox LAD to Mid LAD lesion, 50% stenosed.  Ramus Intermedius  Vessel is small.  Ramus lesion, 80% stenosed.  Left Circumflex  Prox Cx lesion, 95% stenosed. The lesion is eccentric.  Angioplasty: Lesion crossed with guidewire using a WIRE COUGAR XT ST. 190CM. Pre-stent angioplasty was performed using a BALLOON MOZEC 2.50X14. A STENT PROMUS PREM MR 3.5X24 drug eluting stent was successfully placed. Post-stent angioplasty was not performed. The pre-interventional distal flow is normal (TIMI 3). The post-interventional distal flow is normal (TIMI 3). The intervention was successful . No complications occurred at this lesion. An EBU guide is used. Heparin and aggrastat are used for anticoagulation. A therapeutic ACT is achieved. The lesion is dilated with a 2.5 mm balloon, then stented with a 3.5x24 mm Promus DES.  There is no residual stenosis post intervention.  Right Coronary Artery  Mid RCA lesion, 50% stenosed.  Dist RCA lesion, 40% stenosed.  Wall Motion              Left Heart   Left Ventricle The left ventricular size is normal. The left ventricular systolic function is normal. LV end diastolic pressure is normal. The left ventricular ejection fraction is 55-65% by visual estimate. No regional wall motion abnormalities.    Coronary Diagrams   Diagnostic Diagram       Post-Intervention Diagram         ASSESSMENT AND PLAN: 1.  CAD, native vessel, with atypical angina: The patient is reassured today. I reviewed all the data from his emergency room visit including his EKG, radiographic data, and lab work. He will resume outpatient cardiac rehabilitation. He did have some residual coronary  artery disease with medical therapy recommended. I advised an exercise stress test before his follow-up visit in 3 months.  I spent time with the patient today reviewing his cardiac catheterization images with him. I explained the rationale for ongoing medical therapy of his nonobstructive residual coronary artery disease.  2. Hyperlipidemia: The patient is treated with atorvastatin. His most recent lipids are reviewed as outlined above.  Current medicines are reviewed with the patient today.  The  patient does not have concerns regarding medicines.  Labs/ tests ordered today include:   Orders Placed This Encounter  Procedures  . Exercise Tolerance Test    Disposition:   FU 3 months with an APP, 6 months with me.   Time spent = 35 minutes, greater than 50% of this time in counseling  Signed, Sherren Mocha, MD  03/07/2017 6:33 AM    [facility] [address], [place], [place]  [address] Phone: [phone]; Fax: [phone]

## 2017-03-07 ENCOUNTER — Encounter (HOSPITAL_COMMUNITY)
Admission: RE | Admit: 2017-03-07 | Discharge: 2017-03-07 | Disposition: A | Discharge status: Home / Self Care | Payer: Medicare Other | Source: Ambulatory Visit | Attending: Cardiovascular Disease | Admitting: Cardiovascular Disease

## 2017-03-07 DIAGNOSIS — Z955 Presence of coronary angioplasty implant and graft: Secondary | ICD-10-CM | POA: Diagnosis not present

## 2017-03-07 DIAGNOSIS — I2121 ST elevation (STEMI) myocardial infarction involving left circumflex coronary artery: Secondary | ICD-10-CM

## 2017-03-07 DIAGNOSIS — I213 ST elevation (STEMI) myocardial infarction of unspecified site: Secondary | ICD-10-CM | POA: Diagnosis not present

## 2017-03-10 ENCOUNTER — Encounter (HOSPITAL_COMMUNITY)
Admission: RE | Admit: 2017-03-10 | Discharge: 2017-03-10 | Disposition: A | Discharge status: Home / Self Care | Payer: Medicare Other | Source: Ambulatory Visit | Attending: Cardiovascular Disease | Admitting: Cardiovascular Disease

## 2017-03-10 DIAGNOSIS — Z955 Presence of coronary angioplasty implant and graft: Secondary | ICD-10-CM

## 2017-03-10 DIAGNOSIS — I2121 ST elevation (STEMI) myocardial infarction involving left circumflex coronary artery: Secondary | ICD-10-CM

## 2017-03-10 DIAGNOSIS — I213 ST elevation (STEMI) myocardial infarction of unspecified site: Secondary | ICD-10-CM | POA: Diagnosis not present

## 2017-03-12 ENCOUNTER — Encounter (HOSPITAL_COMMUNITY)
Admission: RE | Admit: 2017-03-12 | Discharge: 2017-03-12 | Disposition: A | Discharge status: Home / Self Care | Payer: Medicare Other | Source: Ambulatory Visit | Attending: Cardiovascular Disease | Admitting: Cardiovascular Disease

## 2017-03-12 DIAGNOSIS — I2121 ST elevation (STEMI) myocardial infarction involving left circumflex coronary artery: Secondary | ICD-10-CM

## 2017-03-12 DIAGNOSIS — Z955 Presence of coronary angioplasty implant and graft: Secondary | ICD-10-CM | POA: Diagnosis not present

## 2017-03-12 DIAGNOSIS — I213 ST elevation (STEMI) myocardial infarction of unspecified site: Secondary | ICD-10-CM | POA: Diagnosis not present

## 2017-03-14 ENCOUNTER — Encounter (HOSPITAL_COMMUNITY)
Admission: RE | Admit: 2017-03-14 | Discharge: 2017-03-14 | Disposition: A | Discharge status: Home / Self Care | Payer: Medicare Other | Source: Ambulatory Visit | Attending: Cardiovascular Disease | Admitting: Cardiovascular Disease

## 2017-03-14 DIAGNOSIS — Z955 Presence of coronary angioplasty implant and graft: Secondary | ICD-10-CM

## 2017-03-14 DIAGNOSIS — I213 ST elevation (STEMI) myocardial infarction of unspecified site: Secondary | ICD-10-CM | POA: Diagnosis not present

## 2017-03-14 DIAGNOSIS — I2121 ST elevation (STEMI) myocardial infarction involving left circumflex coronary artery: Secondary | ICD-10-CM

## 2017-03-17 ENCOUNTER — Encounter (HOSPITAL_COMMUNITY)
Admission: RE | Admit: 2017-03-17 | Discharge: 2017-03-17 | Disposition: A | Discharge status: Home / Self Care | Payer: Medicare Other | Source: Ambulatory Visit | Attending: Cardiovascular Disease | Admitting: Cardiovascular Disease

## 2017-03-17 DIAGNOSIS — Z955 Presence of coronary angioplasty implant and graft: Secondary | ICD-10-CM | POA: Diagnosis not present

## 2017-03-17 DIAGNOSIS — I2121 ST elevation (STEMI) myocardial infarction involving left circumflex coronary artery: Secondary | ICD-10-CM

## 2017-03-17 DIAGNOSIS — I213 ST elevation (STEMI) myocardial infarction of unspecified site: Secondary | ICD-10-CM | POA: Diagnosis not present

## 2017-03-18 ENCOUNTER — Ambulatory Visit (INDEPENDENT_AMBULATORY_CARE_PROVIDER_SITE_OTHER): Payer: Medicare Other | Admitting: Gastroenterology

## 2017-03-18 ENCOUNTER — Encounter: Payer: Self-pay | Admitting: Gastroenterology

## 2017-03-18 VITALS — BP 120/60 | HR 68 | Ht 72.0 in | Wt 211.0 lb

## 2017-03-18 DIAGNOSIS — R0789 Other chest pain: Secondary | ICD-10-CM | POA: Diagnosis not present

## 2017-03-18 DIAGNOSIS — I2121 ST elevation (STEMI) myocardial infarction involving left circumflex coronary artery: Secondary | ICD-10-CM

## 2017-03-18 DIAGNOSIS — K219 Gastro-esophageal reflux disease without esophagitis: Secondary | ICD-10-CM

## 2017-03-18 DIAGNOSIS — R142 Eructation: Secondary | ICD-10-CM | POA: Diagnosis not present

## 2017-03-18 DIAGNOSIS — K5901 Slow transit constipation: Secondary | ICD-10-CM

## 2017-03-18 NOTE — Patient Instructions (Addendum)
Miralax one capful in water every other day.  If no improvement in constipation after week of that, increase to every day while decreasing the fiber dose by half.  See me in three months, but call sooner if needed.  Thank you for choosing [facility]  Dr Wilfrid Lund III

## 2017-03-18 NOTE — Progress Notes (Signed)
Ryan [name] Progress Note  Chief Complaint: GERD and chest pain  Subjective  History:  This is a 71 year old man I last saw 2 months ago for GERD and chest pain. See that office note for details. Briefly, he had chronic chest pain that he felt was likely GERD related, but then was admitted with an acute MI. He had a coronary stent placed, and has multivessel coronary disease. He had some symptoms consistent with GERD, so he has been on once daily PPI that he takes at 7 PM. Overall, his symptoms are improved from when I last saw him. He has been undergoing cardiac rehabilitation. He is being good about an antireflux diet, and really misses having his daily coffee. There've been some episodes of belching even with just drinking water, and some intermittent brief sharp chest pain that is nonexertional. He had one severe episode that occurred about 4 AM and woke him from sleep. It did not get much better with antacid, so he took a nitroglycerin. He feels it probably subsided since he went back to sleep but in the morning still had a nagging discomfort. He went to cardiac rehabilitation as scheduled, and when they heard he was having chest pain, sent him to the ED. Ryan [name] says that he was still having chest pain upon arrival to the ED, reports indicate his EKG showed no ischemic changes in his CK was negative. He was cleared to go home, and saw Dr. Burt Knack of cardiology the next day. I reviewed Dr. Antionette Char extensive office note. He does not feel that the pain was unstable angina.  As a separate complaint, Ryan [name] still bothered by years of chronic constipation. 6 fiber capsules a day still only give him small, pellet-like stools about every 3 or 4 days. After that period of time he starts to get "stopped up" where he will have rectal pain/pressure there's been no rectal bleeding for many months.  ROS: Weight stable Respiratory: no dyspnea  The patient's Past Medical, Family and Social  History were reviewed and are on file in the EMR.  Objective:  Med list reviewed  Vital signs in last 24 hrs: Vitals:   03/18/17 1341  BP: 120/60  Pulse: 68    Physical Exam    HEENT: sclera anicteric, oral mucosa moist without lesions  Neck: supple, no thyromegaly, JVD or lymphadenopathy  Cardiac: RRR without murmurs, S1S2 heard, no peripheral edema  Pulm: clear to auscultation bilaterally, normal RR and effort noted  Abdomen: soft, No tenderness, with active bowel sounds. No guarding or palpable hepatosplenomegaly. Rectus diastasis as before Skin; warm and dry, no jaundice or rash  Recent Labs:  Recent ER lab and EKG workup reviewed and on file in chart. His coronary angiogram results were again reviewed and Dr. Antionette Char noted.   @ASSESSMENTPLANBEGIN @ Assessment: Encounter Diagnoses  Name Primary?  . Gastroesophageal reflux disease, esophagitis presence not specified Yes  . Midsternal chest pain   . Belching   . Slow transit constipation    I think Ryan [name] has some underlying GERD, it is not clear if that entirely explain his chest pain. He has multivessel coronary disease, but this chest pain is apparently been nonexertional. I think there may be some element of a visceral hypersensitivity, especially since he reports frequent belching just drinking water.  I have reassured him that I do not think he is likely to have erosive or otherwise harmful esophagitis from his reflux. We also discussed the likelihood than upper endoscopy at this  point would probably be normal, and I don't think that we'll likely change his management. I also discussed at length the limitation of potent antacid medicine such as Protonix, given the patients can have reflux-like symptoms from non-acid refluxate and food triggers. He does not have dysphagia, vomiting, early satiety, or weight loss.  Plan: Redosed PPI to about 30-45 minutes before supper meal Take Maalox once or twice daily at  meal time or as needed. Elevate head of bed Continue antireflux dietary modifications Add MiraLAX one capful every other day. If, after a week of that there is no improvement in constipation, increase MiraLAX to once daily.  See me in 3 months or sooner as needed.  Total time  30 minutes, over half spent in counseling and coordination of care.   Nelida Meuse III   CC: Sherren Mocha, M.D.  Sallee Provencal, NP

## 2017-03-19 ENCOUNTER — Encounter (HOSPITAL_COMMUNITY)
Admission: RE | Admit: 2017-03-19 | Discharge: 2017-03-19 | Disposition: A | Discharge status: Home / Self Care | Payer: Medicare Other | Source: Ambulatory Visit | Attending: Cardiovascular Disease | Admitting: Cardiovascular Disease

## 2017-03-19 DIAGNOSIS — Z955 Presence of coronary angioplasty implant and graft: Secondary | ICD-10-CM

## 2017-03-19 DIAGNOSIS — I2121 ST elevation (STEMI) myocardial infarction involving left circumflex coronary artery: Secondary | ICD-10-CM

## 2017-03-19 DIAGNOSIS — I213 ST elevation (STEMI) myocardial infarction of unspecified site: Secondary | ICD-10-CM | POA: Diagnosis not present

## 2017-03-19 NOTE — Progress Notes (Signed)
Cardiac Individual Treatment Plan  Patient Details  Name: Ryan [name] MRN: [id number] Date of Birth: [birth year] Referring Provider:     CARDIAC REHAB PHASE II ORIENTATION from 01/16/2017 in [place]  Referring Provider  Sherren Mocha MD      Initial Encounter Date:    CARDIAC REHAB PHASE II ORIENTATION from 01/16/2017 in [place]  Date  01/16/17  Referring Provider  Sherren Mocha MD      Visit Diagnosis: ST elevation myocardial infarction involving left circumflex coronary artery (HCC)  S/P drug eluting coronary stent placement  Patient's Home Medications on Admission:  Current Outpatient Prescriptions:  .  acetaminophen (TYLENOL) 325 MG tablet, Take 325-650 mg by mouth every 6 (six) hours as needed for mild pain or moderate pain., Disp: , Rfl:  .  albuterol (PROVENTIL HFA;VENTOLIN HFA) 108 (90 Base) MCG/ACT inhaler, Inhale 2 puffs into the lungs every 6 (six) hours as needed for wheezing., Disp: 1 Inhaler, Rfl: 1 .  aspirin EC 81 MG EC tablet, Take 1 tablet (81 mg total) by mouth daily., Disp: 30 tablet, Rfl: 11 .  atorvastatin (LIPITOR) 80 MG tablet, Take 1 tablet (80 mg total) by mouth daily at 6 PM., Disp: 90 tablet, Rfl: 3 .  bisacodyl (DULCOLAX) 5 MG EC tablet, Take 5 mg by mouth daily as needed for moderate constipation., Disp: , Rfl:  .  Cholecalciferol (VITAMIN D3) 5000 units CAPS, Take 1 capsule by mouth every morning., Disp: , Rfl:  .  HYDROcodone-homatropine (HYCODAN) 5-1.5 MG/5ML syrup, Take 5 mLs by mouth every 8 (eight) hours as needed for cough., Disp: 120 mL, Rfl: 0 .  Melatonin 5 MG TABS, Take 5 mg by mouth at bedtime as needed (sleep)., Disp: , Rfl:  .  nitroGLYCERIN (NITROSTAT) 0.4 MG SL tablet, Place 1 tablet (0.4 mg total) under the tongue every 5 (five) minutes x 3 doses as needed for chest pain., Disp: 12 tablet, Rfl: 12 .  pantoprazole (PROTONIX) 40 MG tablet, Take 1 tablet (40 mg  total) by mouth daily., Disp: 90 tablet, Rfl: 3 .  SYNTHROID 88 MCG tablet, Take 1 tablet (88 mcg total) by mouth daily., Disp: 90 tablet, Rfl: 1 .  tamsulosin (FLOMAX) 0.4 MG CAPS capsule, Take 1 capsule (0.4 mg total) by mouth daily., Disp: 90 capsule, Rfl: 1 .  ticagrelor (BRILINTA) 90 MG TABS tablet, Take 1 tablet (90 mg total) by mouth 2 (two) times daily., Disp: 180 tablet, Rfl: 3  Past Medical History: Past Medical History:  Diagnosis Date  . Allergy   . Anxiety   . Arthritis   . Asthma   . Cataract   . Coronary artery disease 12/08/2016   STEMI, DES Circumflex  . Hyperlipidemia   . Hypothyroidism   . STEMI (ST elevation myocardial infarction) ([place]) 12/08/2016    Tobacco Use: History  Smoking Status  . Never Smoker  Smokeless Tobacco  . Never Used    Labs: Recent Review Flowsheet Data    Labs for ITP Cardiac and Pulmonary Rehab Latest Ref Rng & Units 05/17/2014 06/05/2015 08/28/2016 12/08/2016 01/21/2017   Cholestrol 100 - 199 mg/dL 209(H) 217(H) 222(H) 206(H) 89(L)   LDLCALC 0 - 99 mg/dL 150(H) 144(H) 157(H) 144(H) 48   LDLDIRECT mg/dL - - - - -   HDL >39 mg/dL 39.20 49.40 44.40 44 31(L)   Trlycerides 0 - 149 mg/dL 99.0 117.0 103.0 92 50      Capillary Blood Glucose: No  results found for: GLUCAP   Exercise Target Goals:    Exercise Program Goal: Individual exercise prescription set with THRR, safety & activity barriers. Participant demonstrates ability to understand and report RPE using BORG scale, to self-measure pulse accurately, and to acknowledge the importance of the exercise prescription.  Exercise Prescription Goal: Starting with aerobic activity 30 plus minutes a day, 3 days per week for initial exercise prescription. Provide home exercise prescription and guidelines that participant acknowledges understanding prior to discharge.  Activity Barriers & Risk Stratification:     Activity Barriers & Cardiac Risk Stratification - 01/16/17 1556       Activity Barriers & Cardiac Risk Stratification   Activity Barriers None   Cardiac Risk Stratification High      6 Minute Walk:     6 Minute Walk    Row Name 01/16/17 1450         6 Minute Walk   Phase Initial     Distance 1615 feet     Walk Time 6 minutes     # of Rest Breaks 0     MPH 3.1     METS 3     RPE 9     VO2 Peak 10.4     Symptoms No     Resting HR 65 bpm     Resting BP 124/80     Max Ex. HR 83 bpm     Max Ex. BP 100/70     2 Minute Post BP 118/60        Oxygen Initial Assessment:   Oxygen Re-Evaluation:   Oxygen Discharge (Final Oxygen Re-Evaluation):   Initial Exercise Prescription:     Initial Exercise Prescription - 01/16/17 1400      Date of Initial Exercise RX and Referring Provider   Date 01/16/17   Referring Provider Sherren Mocha MD     Treadmill   MPH 2.5   Grade 0   Minutes 10   METs 3     Bike   Level 0.8   Minutes 10   METs 3     NuStep   Level 3   SPM 85   Minutes 10   METs 3     Prescription Details   Frequency (times per week) 3   Duration Progress to 45 minutes of aerobic exercise without signs/symptoms of physical distress     Intensity   THRR 40-80% of Max Heartrate 59-118   Ratings of Perceived Exertion 11-13   Perceived Dyspnea 0-4     Progression   Progression Continue to progress workloads to maintain intensity without signs/symptoms of physical distress.     Resistance Training   Training Prescription Yes   Weight 3   Reps 10-15      Perform Capillary Blood Glucose checks as needed.  Exercise Prescription Changes:     Exercise Prescription Changes    Row Name 02/13/17 1400 03/13/17 1700           Response to Exercise   Blood Pressure (Admit) 110/68 (P)  120/72      Blood Pressure (Exercise) 122/80 (P)  138/70      Blood Pressure (Exit) 120/80 (P)  122/74      Heart Rate (Admit) 72 bpm (P)  65 bpm      Heart Rate (Exercise) 99 bpm (P)  108 bpm      Heart Rate (Exit) 58 bpm (P)   72 bpm      Rating of Perceived Exertion (Exercise)  12 (P)  11      Duration Progress to 45 minutes of aerobic exercise without signs/symptoms of physical distress (P)  Progress to 45 minutes of aerobic exercise without signs/symptoms of physical distress      Intensity THRR unchanged (P)  THRR unchanged        Progression   Progression Continue to progress workloads to maintain intensity without signs/symptoms of physical distress. (P)  Continue to progress workloads to maintain intensity without signs/symptoms of physical distress.      Average METs 3.5 (P)  3.9        Resistance Training   Training Prescription Yes (P)  Yes      Weight 5lb (P)  6lb      Reps 10-15 (P)  10-15        Treadmill   MPH 2.9 (P)  2.9      Grade 0 (P)  0      Minutes 10 (P)  10      METs 3.22 (P)  3.22        Bike   Level 1 (P)  1      Minutes 10 (P)  10      METs 3 (P)  3        NuStep   Level 4 (P)  4      SPM 85 (P)  85      Minutes 10 (P)  10      METs 4.3 (P)  4.3        Home Exercise Plan   Plans to continue exercise at Home (comment) (P)  Home (comment)      Frequency Add 4 additional days to program exercise sessions. (P)  Add 4 additional days to program exercise sessions.      Initial Home Exercises Provided 02/10/17 (P)  02/10/17         Exercise Comments:     Exercise Comments    Row Name 02/13/17 1447 03/14/17 0821         Exercise Comments Reviewed home exercise program with pt.  Reviewed goals with pt.          Exercise Goals and Review:     Exercise Goals    Row Name 01/16/17 1420             Exercise Goals   Increase Physical Activity Yes       Intervention Provide advice, education, support and counseling about physical activity/exercise needs.;Develop an individualized exercise prescription for aerobic and resistive training based on initial evaluation findings, risk stratification, comorbidities and participant's personal goals.       Expected Outcomes  Achievement of increased cardiorespiratory fitness and enhanced flexibility, muscular endurance and strength shown through measurements of functional capacity and personal statement of participant.       Increase Strength and Stamina Yes       Intervention Provide advice, education, support and counseling about physical activity/exercise needs.;Develop an individualized exercise prescription for aerobic and resistive training based on initial evaluation findings, risk stratification, comorbidities and participant's personal goals.       Expected Outcomes Achievement of increased cardiorespiratory fitness and enhanced flexibility, muscular endurance and strength shown through measurements of functional capacity and personal statement of participant.          Exercise Goals Re-Evaluation :     Exercise Goals Re-Evaluation    [place] Name 02/13/17 1445 03/14/17 0820           Exercise  Goal Re-Evaluation   Exercise Goals Review Increase Physical Activity;Increase Strenth and Stamina Increase Physical Activity;Increase Strenth and Stamina      Comments Pt is doing well with exercise in CR.  He says he is doing some wlaking and stretching at home 5xs/week Pt is doing well with exercise and tolerating workload increases well.  He says he is doing some wlaking and stretching at home 5xs/week      Expected Outcomes Continue walking and stretching in CR and gradually increase workloads in CR in order to increase strength and stamina Continue walking and stretching at home and gradually increase workloads in CR in order to increase strength and stamina          Discharge Exercise Prescription (Final Exercise Prescription Changes):     Exercise Prescription Changes - 03/13/17 1700      Response to Exercise   Blood Pressure (Admit) (P)  120/72   Blood Pressure (Exercise) (P)  138/70   Blood Pressure (Exit) (P)  122/74   Heart Rate (Admit) (P)  65 bpm   Heart Rate (Exercise) (P)  108 bpm   Heart  Rate (Exit) (P)  72 bpm   Rating of Perceived Exertion (Exercise) (P)  11   Duration (P)  Progress to 45 minutes of aerobic exercise without signs/symptoms of physical distress   Intensity (P)  THRR unchanged     Progression   Progression (P)  Continue to progress workloads to maintain intensity without signs/symptoms of physical distress.   Average METs (P)  3.9     Resistance Training   Training Prescription (P)  Yes   Weight (P)  6lb   Reps (P)  10-15     Treadmill   MPH (P)  2.9   Grade (P)  0   Minutes (P)  10   METs (P)  3.22     Bike   Level (P)  1   Minutes (P)  10   METs (P)  3     NuStep   Level (P)  4   SPM (P)  85   Minutes (P)  10   METs (P)  4.3     Home Exercise Plan   Plans to continue exercise at (P)  Home (comment)   Frequency (P)  Add 4 additional days to program exercise sessions.   Initial Home Exercises Provided (P)  02/10/17      Nutrition:  Target Goals: Understanding of nutrition guidelines, daily intake of sodium 1500mg , cholesterol 200mg , calories 30% from fat and 7% or less from saturated fats, daily to have 5 or more servings of fruits and vegetables.  Biometrics:     Pre Biometrics - 01/16/17 1547      Pre Biometrics   Height 6' (1.829 m)   Weight 214 lb 1.1 oz (97.1 kg)   Waist Circumference 43.25 inches   Hip Circumference 43.75 inches   Waist to Hip Ratio 0.99 %   BMI (Calculated) 29.1   Triceps Skinfold 26 mm   % Body Fat 31.6 %   Grip Strength 51 kg   Flexibility 10 in   Single Leg Stand 30 seconds       Nutrition Therapy Plan and Nutrition Goals:     Nutrition Therapy & Goals - 01/24/17 1110      Nutrition Therapy   Diet Therapeutic Lifestyle Changes     Personal Nutrition Goals   Nutrition Goal Pt to identify food quantities necessary to achieve weight loss of 6-20  lb at graduation from cardiac rehab.     Intervention Plan   Intervention Prescribe, educate and counsel regarding individualized specific  dietary modifications aiming towards targeted core components such as weight, hypertension, lipid management, diabetes, heart failure and other comorbidities.   Expected Outcomes Short Term Goal: Understand basic principles of dietary content, such as calories, fat, sodium, cholesterol and nutrients.;Long Term Goal: Adherence to prescribed nutrition plan.      Nutrition Discharge: Nutrition Scores:     Nutrition Assessments - 02/17/17 1117      MEDFICTS Scores   Pre Score 22      Nutrition Goals Re-Evaluation:     Nutrition Goals Re-Evaluation    Row Name 02/17/17 1125             Goals   Current Weight 213 lb 3 oz (96.7 kg)       Nutrition Goal Pt to identify food quantities necessary to achieve weight loss of 6-20 lb at graduation from cardiac rehab. Goal wt of 190 lb desired.       Comment Pt wt is down 2 lb since admission.          Nutrition Goals Re-Evaluation:     Nutrition Goals Re-Evaluation    [place] Name 02/17/17 1125             Goals   Current Weight 213 lb 3 oz (96.7 kg)       Nutrition Goal Pt to identify food quantities necessary to achieve weight loss of 6-20 lb at graduation from cardiac rehab. Goal wt of 190 lb desired.       Comment Pt wt is down 2 lb since admission.          Nutrition Goals Discharge (Final Nutrition Goals Re-Evaluation):     Nutrition Goals Re-Evaluation - 02/17/17 1125      Goals   Current Weight 213 lb 3 oz (96.7 kg)   Nutrition Goal Pt to identify food quantities necessary to achieve weight loss of 6-20 lb at graduation from cardiac rehab. Goal wt of 190 lb desired.   Comment Pt wt is down 2 lb since admission.      Psychosocial: Target Goals: Acknowledge presence or absence of significant depression and/or stress, maximize coping skills, provide positive support system. Participant is able to verbalize types and ability to use techniques and skills needed for reducing stress and depression.  Initial Review &  Psychosocial Screening:     Initial Psych Review & Screening - 01/16/17 1544      Initial Review   Current issues with None Identified     Family Dynamics   Good Support System? Yes     Barriers   Psychosocial barriers to participate in program There are no identifiable barriers or psychosocial needs.     Screening Interventions   Interventions Encouraged to exercise      Quality of Life Scores:     Quality of Life - 01/16/17 1503      Quality of Life Scores   Health/Function Pre 23.1 %   Socioeconomic Pre 25.93 %   Psych/Spiritual Pre 25.71 %   Family Pre 28.8 %   GLOBAL Pre 25.06 %      PHQ-9: Recent Review Flowsheet Data    Depression screen [facility] 2/9 01/20/2017 08/28/2016 06/05/2015 05/17/2014 05/13/2013   Decreased Interest 0 0 0 0 0   Down, Depressed, Hopeless 0 0 0 0 0   PHQ - 2 Score 0 0 0 0 0  Interpretation of Total Score  Total Score Depression Severity:  1-4 = Minimal depression, 5-9 = Mild depression, 10-14 = Moderate depression, 15-19 = Moderately severe depression, 20-27 = Severe depression   Psychosocial Evaluation and Intervention:   Psychosocial Re-Evaluation:     Psychosocial Re-Evaluation    Row Name 02/17/17 1339 03/19/17 1841           Psychosocial Re-Evaluation   Current issues with None Identified None Identified      Interventions Encouraged to attend Cardiac Rehabilitation for the exercise Encouraged to attend Cardiac Rehabilitation for the exercise      Continue Psychosocial Services  No Follow up required No Follow up required         Psychosocial Discharge (Final Psychosocial Re-Evaluation):     Psychosocial Re-Evaluation - 03/19/17 1841      Psychosocial Re-Evaluation   Current issues with None Identified   Interventions Encouraged to attend Cardiac Rehabilitation for the exercise   Continue Psychosocial Services  No Follow up required      Vocational Rehabilitation: Provide vocational rehab assistance to  qualifying candidates.   Vocational Rehab Evaluation & Intervention:     Vocational Rehab - 01/16/17 1544      Initial Vocational Rehab Evaluation & Intervention   Assessment shows need for Vocational Rehabilitation No      Education: Education Goals: Education classes will be provided on a weekly basis, covering required topics. Participant will state understanding/return demonstration of topics presented.  Learning Barriers/Preferences:     Learning Barriers/Preferences - 01/16/17 1543      Learning Barriers/Preferences   Learning Barriers None   Learning Preferences Skilled Demonstration;Verbal Instruction;Written Material;Audio      Education Topics: Count Your Pulse:  -Group instruction provided by verbal instruction, demonstration, patient participation and written materials to support subject.  Instructors address importance of being able to find your pulse and how to count your pulse when at home without a heart monitor.  Patients get hands on experience counting their pulse with staff help and individually.   Heart Attack, Angina, and Risk Factor Modification:  -Group instruction provided by verbal instruction, video, and written materials to support subject.  Instructors address signs and symptoms of angina and heart attacks.    Also discuss risk factors for heart disease and how to make changes to improve heart health risk factors.   CARDIAC REHAB PHASE II EXERCISE from 02/26/2017 in [place]  Date  02/26/17  Instruction Review Code  2- meets goals/outcomes      Functional Fitness:  -Group instruction provided by verbal instruction, demonstration, patient participation, and written materials to support subject.  Instructors address safety measures for doing things around the house.  Discuss how to get up and down off the floor, how to pick things up properly, how to safely get out of a chair without assistance, and balance  training.   Meditation and Mindfulness:  -Group instruction provided by verbal instruction, patient participation, and written materials to support subject.  Instructor addresses importance of mindfulness and meditation practice to help reduce stress and improve awareness.  Instructor also leads participants through a meditation exercise.    Stretching for Flexibility and Mobility:  -Group instruction provided by verbal instruction, patient participation, and written materials to support subject.  Instructors lead participants through series of stretches that are designed to increase flexibility thus improving mobility.  These stretches are additional exercise for major muscle groups that are typically performed during regular warm up and  cool down.   Hands Only CPR:  -Group verbal, video, and participation provides a basic overview of AHA guidelines for community CPR. Role-play of emergencies allow participants the opportunity to practice calling for help and chest compression technique with discussion of AED use.   Hypertension: -Group verbal and written instruction that provides a basic overview of hypertension including the most recent diagnostic guidelines, risk factor reduction with self-care instructions and medication management.    Nutrition I class: Heart Healthy Eating:  -Group instruction provided by PowerPoint slides, verbal discussion, and written materials to support subject matter. The instructor gives an explanation and review of the Therapeutic Lifestyle Changes diet recommendations, which includes a discussion on lipid goals, dietary fat, sodium, fiber, plant stanol/sterol esters, sugar, and the components of a well-balanced, healthy diet.   CARDIAC REHAB PHASE II EXERCISE from 02/26/2017 in [place]  Date  02/17/17  Educator  RD  Instruction Review Code  Not applicable      Nutrition II class: Lifestyle Skills:  -Group instruction  provided by PowerPoint slides, verbal discussion, and written materials to support subject matter. The instructor gives an explanation and review of label reading, grocery shopping for heart health, heart healthy recipe modifications, and ways to make healthier choices when eating out.   CARDIAC REHAB PHASE II EXERCISE from 02/26/2017 in [place]  Date  02/17/17  Educator  RD  Instruction Review Code  Not applicable      Diabetes Question & Answer:  -Group instruction provided by PowerPoint slides, verbal discussion, and written materials to support subject matter. The instructor gives an explanation and review of diabetes co-morbidities, pre- and post-prandial blood glucose goals, pre-exercise blood glucose goals, signs, symptoms, and treatment of hypoglycemia and hyperglycemia, and foot care basics.   Diabetes Blitz:  -Group instruction provided by PowerPoint slides, verbal discussion, and written materials to support subject matter. The instructor gives an explanation and review of the physiology behind type 1 and type 2 diabetes, diabetes medications and rational behind using different medications, pre- and post-prandial blood glucose recommendations and Hemoglobin A1c goals, diabetes diet, and exercise including blood glucose guidelines for exercising safely.    Portion Distortion:  -Group instruction provided by PowerPoint slides, verbal discussion, written materials, and food models to support subject matter. The instructor gives an explanation of serving size versus portion size, changes in portions sizes over the last 20 years, and what consists of a serving from each food group.   Stress Management:  -Group instruction provided by verbal instruction, video, and written materials to support subject matter.  Instructors review role of stress in heart disease and how to cope with stress positively.     CARDIAC REHAB PHASE II EXERCISE from 02/26/2017 in  [place]  Date  01/29/17  Instruction Review Code  2- meets goals/outcomes      Exercising on Your Own:  -Group instruction provided by verbal instruction, power point, and written materials to support subject.  Instructors discuss benefits of exercise, components of exercise, frequency and intensity of exercise, and end points for exercise.  Also discuss use of nitroglycerin and activating EMS.  Review options of places to exercise outside of rehab.  Review guidelines for sex with heart disease.   CARDIAC REHAB PHASE II EXERCISE from 02/26/2017 in [place]  Date  02/19/17  Instruction Review Code  2- meets goals/outcomes      Cardiac Drugs  I:  -Group instruction provided by verbal instruction and written materials to support subject.  Instructor reviews cardiac drug classes: antiplatelets, anticoagulants, beta blockers, and statins.  Instructor discusses reasons, side effects, and lifestyle considerations for each drug class.   Cardiac Drugs II:  -Group instruction provided by verbal instruction and written materials to support subject.  Instructor reviews cardiac drug classes: angiotensin converting enzyme inhibitors (ACE-I), angiotensin II receptor blockers (ARBs), nitrates, and calcium channel blockers.  Instructor discusses reasons, side effects, and lifestyle considerations for each drug class.   CARDIAC REHAB PHASE II EXERCISE from 02/26/2017 in [place]  Date  02/12/17  Instruction Review Code  2- meets goals/outcomes      Anatomy and Physiology of the Circulatory System:  Group verbal and written instruction and models provide basic cardiac anatomy and physiology, with the coronary electrical and arterial systems. Review of: AMI, Angina, Valve disease, Heart Failure, Peripheral Artery Disease, Cardiac Arrhythmia, Pacemakers, and the ICD.   Other Education:  -Group or individual  verbal, written, or video instructions that support the educational goals of the cardiac rehab program.   Knowledge Questionnaire Score:     Knowledge Questionnaire Score - 01/16/17 1419      Knowledge Questionnaire Score   Pre Score 19/24      Core Components/Risk Factors/Patient Goals at Admission:     Personal Goals and Risk Factors at Admission - 02/17/17 1127      Core Components/Risk Factors/Patient Goals on Admission    Weight Management Yes;Weight Loss   Intervention Weight Management: Develop a combined nutrition and exercise program designed to reach desired caloric intake, while maintaining appropriate intake of nutrient and fiber, sodium and fats, and appropriate energy expenditure required for the weight goal.   Admit Weight 214 lb (97.1 kg)   Goal Weight: Short Term 208 lb (94.3 kg)   Goal Weight: Long Term 190 lb (86.2 kg)   Expected Outcomes Short Term: Continue to assess and modify interventions until short term weight is achieved;Long Term: Adherence to nutrition and physical activity/exercise program aimed toward attainment of established weight goal;Weight Loss: Understanding of general recommendations for a balanced deficit meal plan, which promotes 1-2 lb weight loss per week and includes a negative energy balance of [phone] kcal/d;Understanding recommendations for meals to include 15-35% energy as protein, 25-35% energy from fat, 35-60% energy from carbohydrates, less than 200mg  of dietary cholesterol, 20-35 gm of total fiber daily;Understanding of distribution of calorie intake throughout the day with the consumption of 4-5 meals/snacks   Lipids Yes   Intervention Provide education and support for participant on nutrition & aerobic/resistive exercise along with prescribed medications to achieve LDL 70mg , HDL >40mg .   Expected Outcomes Short Term: Participant states understanding of desired cholesterol values and is compliant with medications prescribed.  Participant is following exercise prescription and nutrition guidelines.;Long Term: Cholesterol controlled with medications as prescribed, with individualized exercise RX and with personalized nutrition plan. Value goals: LDL < 70mg , HDL > 40 mg.   Personal Goal Other Yes   Personal Goal To lose 20 lbs      Core Components/Risk Factors/Patient Goals Review:      Goals and Risk Factor Review    Row Name 02/17/17 1126             Core Components/Risk Factors/Patient Goals Review   Personal Goals Review Weight Management/Obesity       Review Pt has lost 2 lb from admission wt of 214 lb.  Expected Outcomes Continue to encourage slow wt loss of 1-2 lb/week.          Core Components/Risk Factors/Patient Goals at Discharge (Final Review):      Goals and Risk Factor Review - 02/17/17 1126      Core Components/Risk Factors/Patient Goals Review   Personal Goals Review Weight Management/Obesity   Review Pt has lost 2 lb from admission wt of 214 lb.    Expected Outcomes Continue to encourage slow wt loss of 1-2 lb/week.      ITP Comments:     ITP Comments    Row Name 01/16/17 1420 01/16/17 1422         ITP Comments Fransico Him MD Fransico Him MD, Medical Director         Comments: Ronalee Belts is making expected progress toward personal goals after completing 24 sessions. Recommend continued exercise and life style modification education including  stress management and relaxation techniques to decrease cardiac risk profile. Ronalee Belts is doing well with exercise.Barnet Pall, RN,BSN 03/19/2017 6:43 PM

## 2017-03-21 ENCOUNTER — Encounter (HOSPITAL_COMMUNITY)
Admission: RE | Admit: 2017-03-21 | Discharge: 2017-03-21 | Disposition: A | Discharge status: Home / Self Care | Payer: Medicare Other | Source: Ambulatory Visit | Attending: Cardiovascular Disease | Admitting: Cardiovascular Disease

## 2017-03-21 DIAGNOSIS — I2121 ST elevation (STEMI) myocardial infarction involving left circumflex coronary artery: Secondary | ICD-10-CM

## 2017-03-21 DIAGNOSIS — Z955 Presence of coronary angioplasty implant and graft: Secondary | ICD-10-CM | POA: Diagnosis not present

## 2017-03-21 DIAGNOSIS — I213 ST elevation (STEMI) myocardial infarction of unspecified site: Secondary | ICD-10-CM | POA: Diagnosis not present

## 2017-03-24 ENCOUNTER — Encounter (HOSPITAL_COMMUNITY): Payer: Medicare Other

## 2017-03-26 ENCOUNTER — Encounter (HOSPITAL_COMMUNITY): Payer: Medicare Other

## 2017-03-28 ENCOUNTER — Encounter (HOSPITAL_COMMUNITY): Payer: Medicare Other

## 2017-03-31 ENCOUNTER — Encounter (HOSPITAL_COMMUNITY): Payer: Medicare Other

## 2017-04-02 ENCOUNTER — Encounter (HOSPITAL_COMMUNITY): Payer: Medicare Other

## 2017-04-04 ENCOUNTER — Encounter (HOSPITAL_COMMUNITY): Payer: Medicare Other

## 2017-04-09 ENCOUNTER — Encounter (HOSPITAL_COMMUNITY): Payer: Medicare Other

## 2017-04-11 ENCOUNTER — Encounter (HOSPITAL_COMMUNITY)
Admission: RE | Admit: 2017-04-11 | Discharge: 2017-04-11 | Disposition: A | Discharge status: Home / Self Care | Payer: Medicare Other | Source: Ambulatory Visit | Attending: Cardiovascular Disease | Admitting: Cardiovascular Disease

## 2017-04-11 DIAGNOSIS — I2121 ST elevation (STEMI) myocardial infarction involving left circumflex coronary artery: Secondary | ICD-10-CM

## 2017-04-11 DIAGNOSIS — I213 ST elevation (STEMI) myocardial infarction of unspecified site: Secondary | ICD-10-CM | POA: Insufficient documentation

## 2017-04-11 DIAGNOSIS — Z955 Presence of coronary angioplasty implant and graft: Secondary | ICD-10-CM | POA: Diagnosis not present

## 2017-04-14 ENCOUNTER — Encounter (HOSPITAL_COMMUNITY): Payer: Medicare Other

## 2017-04-14 ENCOUNTER — Telehealth (HOSPITAL_COMMUNITY): Payer: Self-pay | Admitting: Family Medicine

## 2017-04-16 ENCOUNTER — Encounter (HOSPITAL_COMMUNITY): Payer: Medicare Other

## 2017-04-17 ENCOUNTER — Encounter (HOSPITAL_COMMUNITY): Payer: Self-pay | Admitting: *Deleted

## 2017-04-17 DIAGNOSIS — Z955 Presence of coronary angioplasty implant and graft: Secondary | ICD-10-CM

## 2017-04-17 DIAGNOSIS — I2121 ST elevation (STEMI) myocardial infarction involving left circumflex coronary artery: Secondary | ICD-10-CM

## 2017-04-17 NOTE — Progress Notes (Signed)
Cardiac Individual Treatment Plan  Patient Details  Name: Ryan [name] MRN: [id number] Date of Birth: [birth year] Referring Provider:     CARDIAC REHAB PHASE II ORIENTATION from 01/16/2017 in [place]  Referring Provider  Sherren Mocha MD      Initial Encounter Date:    CARDIAC REHAB PHASE II ORIENTATION from 01/16/2017 in [place]  Date  01/16/17  Referring Provider  Sherren Mocha MD      Visit Diagnosis: ST elevation myocardial infarction involving left circumflex coronary artery (HCC)  S/P drug eluting coronary stent placement  Patient's Home Medications on Admission:  Current Outpatient Prescriptions:  .  acetaminophen (TYLENOL) 325 MG tablet, Take 325-650 mg by mouth every 6 (six) hours as needed for mild pain or moderate pain., Disp: , Rfl:  .  albuterol (PROVENTIL HFA;VENTOLIN HFA) 108 (90 Base) MCG/ACT inhaler, Inhale 2 puffs into the lungs every 6 (six) hours as needed for wheezing., Disp: 1 Inhaler, Rfl: 1 .  aspirin EC 81 MG EC tablet, Take 1 tablet (81 mg total) by mouth daily., Disp: 30 tablet, Rfl: 11 .  atorvastatin (LIPITOR) 80 MG tablet, Take 1 tablet (80 mg total) by mouth daily at 6 PM., Disp: 90 tablet, Rfl: 3 .  bisacodyl (DULCOLAX) 5 MG EC tablet, Take 5 mg by mouth daily as needed for moderate constipation., Disp: , Rfl:  .  Cholecalciferol (VITAMIN D3) 5000 units CAPS, Take 1 capsule by mouth every morning., Disp: , Rfl:  .  HYDROcodone-homatropine (HYCODAN) 5-1.5 MG/5ML syrup, Take 5 mLs by mouth every 8 (eight) hours as needed for cough., Disp: 120 mL, Rfl: 0 .  Melatonin 5 MG TABS, Take 5 mg by mouth at bedtime as needed (sleep)., Disp: , Rfl:  .  nitroGLYCERIN (NITROSTAT) 0.4 MG SL tablet, Place 1 tablet (0.4 mg total) under the tongue every 5 (five) minutes x 3 doses as needed for chest pain., Disp: 12 tablet, Rfl: 12 .  pantoprazole (PROTONIX) 40 MG tablet, Take 1 tablet (40 mg  total) by mouth daily., Disp: 90 tablet, Rfl: 3 .  SYNTHROID 88 MCG tablet, Take 1 tablet (88 mcg total) by mouth daily., Disp: 90 tablet, Rfl: 1 .  tamsulosin (FLOMAX) 0.4 MG CAPS capsule, Take 1 capsule (0.4 mg total) by mouth daily., Disp: 90 capsule, Rfl: 1 .  ticagrelor (BRILINTA) 90 MG TABS tablet, Take 1 tablet (90 mg total) by mouth 2 (two) times daily., Disp: 180 tablet, Rfl: 3  Past Medical History: Past Medical History:  Diagnosis Date  . Allergy   . Anxiety   . Arthritis   . Asthma   . Cataract   . Coronary artery disease 12/08/2016   STEMI, DES Circumflex  . Hyperlipidemia   . Hypothyroidism   . STEMI (ST elevation myocardial infarction) ([place]) 12/08/2016    Tobacco Use: History  Smoking Status  . Never Smoker  Smokeless Tobacco  . Never Used    Labs: Recent Review Flowsheet Data    Labs for ITP Cardiac and Pulmonary Rehab Latest Ref Rng & Units 05/17/2014 06/05/2015 08/28/2016 12/08/2016 01/21/2017   Cholestrol 100 - 199 mg/dL 209(H) 217(H) 222(H) 206(H) 89(L)   LDLCALC 0 - 99 mg/dL 150(H) 144(H) 157(H) 144(H) 48   LDLDIRECT mg/dL - - - - -   HDL >39 mg/dL 39.20 49.40 44.40 44 31(L)   Trlycerides 0 - 149 mg/dL 99.0 117.0 103.0 92 50      Capillary Blood Glucose: No  results found for: GLUCAP   Exercise Target Goals:    Exercise Program Goal: Individual exercise prescription set with THRR, safety & activity barriers. Participant demonstrates ability to understand and report RPE using BORG scale, to self-measure pulse accurately, and to acknowledge the importance of the exercise prescription.  Exercise Prescription Goal: Starting with aerobic activity 30 plus minutes a day, 3 days per week for initial exercise prescription. Provide home exercise prescription and guidelines that participant acknowledges understanding prior to discharge.  Activity Barriers & Risk Stratification:   6 Minute Walk:   Oxygen Initial Assessment:   Oxygen  Re-Evaluation:   Oxygen Discharge (Final Oxygen Re-Evaluation):   Initial Exercise Prescription:   Perform Capillary Blood Glucose checks as needed.  Exercise Prescription Changes:      Exercise Prescription Changes    Row Name 03/13/17 1700 04/15/17 1000           Response to Exercise   Blood Pressure (Admit) (P)  120/72 120/80      Blood Pressure (Exercise) (P)  138/70 140/80      Blood Pressure (Exit) (P)  122/74 110/64      Heart Rate (Admit) (P)  65 bpm 63 bpm      Heart Rate (Exercise) (P)  108 bpm 119 bpm      Heart Rate (Exit) (P)  72 bpm 73 bpm      Rating of Perceived Exertion (Exercise) (P)  11 12      Symptoms  - none      Duration (P)  Progress to 45 minutes of aerobic exercise without signs/symptoms of physical distress Progress to 45 minutes of aerobic exercise without signs/symptoms of physical distress      Intensity (P)  THRR unchanged THRR unchanged        Progression   Progression (P)  Continue to progress workloads to maintain intensity without signs/symptoms of physical distress. Continue to progress workloads to maintain intensity without signs/symptoms of physical distress.      Average METs (P)  3.9 4.4        Resistance Training   Training Prescription (P)  Yes Yes      Weight (P)  6lb 6lb      Reps (P)  10-15 10-15      Time  - 10 Minutes        Interval Training   Interval Training  - No        Treadmill   MPH (P)  2.9 4      Grade (P)  0 3      Minutes (P)  10 10      METs (P)  3.22 5.72        Bike   Level (P)  1 1      Minutes (P)  10 10      METs (P)  3 2.97        NuStep   Level (P)  4 -      SPM (P)  85 -      Minutes (P)  10 -      METs (P)  4.3 -        Rower   Watts  - 28      Minutes  - 10      METs  - 4.56        Home Exercise Plan   Plans to continue exercise at (P)  Home (comment) Home (comment)      Frequency (P)  Add 4 additional days to program exercise sessions. Add 4 additional days to program exercise  sessions.      Initial Home Exercises Provided (P)  02/10/17 02/10/17         Exercise Comments:      Exercise Comments    Row Name 03/14/17 0821 04/16/17 1540         Exercise Comments Reviewed goals with pt.  Doing well with exercise.         Exercise Goals and Review:   Exercise Goals Re-Evaluation :     Exercise Goals Re-Evaluation    Row Name 03/14/17 0820 04/16/17 1540           Exercise Goal Re-Evaluation   Exercise Goals Review Increase Physical Activity;Increase Strenth and Stamina Increase Physical Activity      Comments Pt is doing well with exercise and tolerating workload increases well.  He says he is doing some wlaking and stretching at home 5xs/week Pt is doing well with exercise, currently averaging 4.4 METs.      Expected Outcomes Continue walking and stretching at home and gradually increase workloads in CR in order to increase strength and stamina Continue walking and stretching at home and gradually increase workloads in CR in order to increase strength and stamina          Discharge Exercise Prescription (Final Exercise Prescription Changes):     Exercise Prescription Changes - 04/15/17 1000      Response to Exercise   Blood Pressure (Admit) 120/80   Blood Pressure (Exercise) 140/80   Blood Pressure (Exit) 110/64   Heart Rate (Admit) 63 bpm   Heart Rate (Exercise) 119 bpm   Heart Rate (Exit) 73 bpm   Rating of Perceived Exertion (Exercise) 12   Symptoms none   Duration Progress to 45 minutes of aerobic exercise without signs/symptoms of physical distress   Intensity THRR unchanged     Progression   Progression Continue to progress workloads to maintain intensity without signs/symptoms of physical distress.   Average METs 4.4     Resistance Training   Training Prescription Yes   Weight 6lb   Reps 10-15   Time 10 Minutes     Interval Training   Interval Training No     Treadmill   MPH 4   Grade 3   Minutes 10   METs 5.72      Bike   Level 1   Minutes 10   METs 2.97     NuStep   Level --   SPM --   Minutes --   METs --     Rower   Watts 28   Minutes 10   METs 4.56     Home Exercise Plan   Plans to continue exercise at Home (comment)   Frequency Add 4 additional days to program exercise sessions.   Initial Home Exercises Provided 02/10/17      Nutrition:  Target Goals: Understanding of nutrition guidelines, daily intake of sodium 1500mg , cholesterol 200mg , calories 30% from fat and 7% or less from saturated fats, daily to have 5 or more servings of fruits and vegetables.  Biometrics:    Nutrition Therapy Plan and Nutrition Goals:   Nutrition Discharge: Nutrition Scores:     Nutrition Assessments - 02/17/17 1117      MEDFICTS Scores   Pre Score 22      Nutrition Goals Re-Evaluation:     Nutrition Goals Re-Evaluation    [place] Name 02/17/17 1125  Goals   Current Weight 213 lb 3 oz (96.7 kg)       Nutrition Goal Pt to identify food quantities necessary to achieve weight loss of 6-20 lb at graduation from cardiac rehab. Goal wt of 190 lb desired.       Comment Pt wt is down 2 lb since admission.          Nutrition Goals Re-Evaluation:     Nutrition Goals Re-Evaluation    [place] Name 02/17/17 1125             Goals   Current Weight 213 lb 3 oz (96.7 kg)       Nutrition Goal Pt to identify food quantities necessary to achieve weight loss of 6-20 lb at graduation from cardiac rehab. Goal wt of 190 lb desired.       Comment Pt wt is down 2 lb since admission.          Nutrition Goals Discharge (Final Nutrition Goals Re-Evaluation):     Nutrition Goals Re-Evaluation - 02/17/17 1125      Goals   Current Weight 213 lb 3 oz (96.7 kg)   Nutrition Goal Pt to identify food quantities necessary to achieve weight loss of 6-20 lb at graduation from cardiac rehab. Goal wt of 190 lb desired.   Comment Pt wt is down 2 lb since admission.       Psychosocial: Target Goals: Acknowledge presence or absence of significant depression and/or stress, maximize coping skills, provide positive support system. Participant is able to verbalize types and ability to use techniques and skills needed for reducing stress and depression.  Initial Review & Psychosocial Screening:   Quality of Life Scores:   PHQ-9: Recent Review Flowsheet Data    Depression screen [facility] 2/9 01/20/2017 08/28/2016 06/05/2015 05/17/2014 05/13/2013   Decreased Interest 0 0 0 0 0   Down, Depressed, Hopeless 0 0 0 0 0   PHQ - 2 Score 0 0 0 0 0     Interpretation of Total Score  Total Score Depression Severity:  1-4 = Minimal depression, 5-9 = Mild depression, 10-14 = Moderate depression, 15-19 = Moderately severe depression, 20-27 = Severe depression   Psychosocial Evaluation and Intervention:   Psychosocial Re-Evaluation:     Psychosocial Re-Evaluation    [place] Name 02/17/17 1339 03/19/17 1841 04/17/17 1100         Psychosocial Re-Evaluation   Current issues with None Identified None Identified None Identified     Interventions Encouraged to attend Cardiac Rehabilitation for the exercise Encouraged to attend Cardiac Rehabilitation for the exercise Encouraged to attend Cardiac Rehabilitation for the exercise     Continue Psychosocial Services  No Follow up required No Follow up required No Follow up required        Psychosocial Discharge (Final Psychosocial Re-Evaluation):     Psychosocial Re-Evaluation - 04/17/17 1100      Psychosocial Re-Evaluation   Current issues with None Identified   Interventions Encouraged to attend Cardiac Rehabilitation for the exercise   Continue Psychosocial Services  No Follow up required      Vocational Rehabilitation: Provide vocational rehab assistance to qualifying candidates.   Vocational Rehab Evaluation & Intervention:   Education: Education Goals: Education classes will be provided on a weekly basis,  covering required topics. Participant will state understanding/return demonstration of topics presented.  Learning Barriers/Preferences:   Education Topics: Count Your Pulse:  -Group instruction provided by verbal instruction, demonstration, patient participation and written materials to support  subject.  Instructors address importance of being able to find your pulse and how to count your pulse when at home without a heart monitor.  Patients get hands on experience counting their pulse with staff help and individually.   CARDIAC REHAB PHASE II EXERCISE from 04/11/2017 in [place]  Date  04/11/17  Instruction Review Code  2- meets goals/outcomes      Heart Attack, Angina, and Risk Factor Modification:  -Group instruction provided by verbal instruction, video, and written materials to support subject.  Instructors address signs and symptoms of angina and heart attacks.    Also discuss risk factors for heart disease and how to make changes to improve heart health risk factors.   CARDIAC REHAB PHASE II EXERCISE from 04/11/2017 in [place]  Date  02/26/17  Instruction Review Code  2- meets goals/outcomes      Functional Fitness:  -Group instruction provided by verbal instruction, demonstration, patient participation, and written materials to support subject.  Instructors address safety measures for doing things around the house.  Discuss how to get up and down off the floor, how to pick things up properly, how to safely get out of a chair without assistance, and balance training.   Meditation and Mindfulness:  -Group instruction provided by verbal instruction, patient participation, and written materials to support subject.  Instructor addresses importance of mindfulness and meditation practice to help reduce stress and improve awareness.  Instructor also leads participants through a meditation exercise.    CARDIAC REHAB PHASE II  EXERCISE from 04/11/2017 in [place]  Date  03/19/17  Instruction Review Code  2- meets goals/outcomes      Stretching for Flexibility and Mobility:  -Group instruction provided by verbal instruction, patient participation, and written materials to support subject.  Instructors lead participants through series of stretches that are designed to increase flexibility thus improving mobility.  These stretches are additional exercise for major muscle groups that are typically performed during regular warm up and cool down.   Hands Only CPR:  -Group verbal, video, and participation provides a basic overview of AHA guidelines for community CPR. Role-play of emergencies allow participants the opportunity to practice calling for help and chest compression technique with discussion of AED use.   Hypertension: -Group verbal and written instruction that provides a basic overview of hypertension including the most recent diagnostic guidelines, risk factor reduction with self-care instructions and medication management.    Nutrition I class: Heart Healthy Eating:  -Group instruction provided by PowerPoint slides, verbal discussion, and written materials to support subject matter. The instructor gives an explanation and review of the Therapeutic Lifestyle Changes diet recommendations, which includes a discussion on lipid goals, dietary fat, sodium, fiber, plant stanol/sterol esters, sugar, and the components of a well-balanced, healthy diet.   CARDIAC REHAB PHASE II EXERCISE from 04/11/2017 in [place]  Date  02/17/17  Educator  RD  Instruction Review Code  Not applicable      Nutrition II class: Lifestyle Skills:  -Group instruction provided by PowerPoint slides, verbal discussion, and written materials to support subject matter. The instructor gives an explanation and review of label reading, grocery shopping for heart health, heart  healthy recipe modifications, and ways to make healthier choices when eating out.   CARDIAC REHAB PHASE II EXERCISE from 04/11/2017 in [place]  Date  02/17/17  Educator  RD  Instruction Review Code  Not applicable      Diabetes Question & Answer:  -Group instruction provided by PowerPoint slides, verbal discussion, and written materials to support subject matter. The instructor gives an explanation and review of diabetes co-morbidities, pre- and post-prandial blood glucose goals, pre-exercise blood glucose goals, signs, symptoms, and treatment of hypoglycemia and hyperglycemia, and foot care basics.   Diabetes Blitz:  -Group instruction provided by PowerPoint slides, verbal discussion, and written materials to support subject matter. The instructor gives an explanation and review of the physiology behind type 1 and type 2 diabetes, diabetes medications and rational behind using different medications, pre- and post-prandial blood glucose recommendations and Hemoglobin A1c goals, diabetes diet, and exercise including blood glucose guidelines for exercising safely.    Portion Distortion:  -Group instruction provided by PowerPoint slides, verbal discussion, written materials, and food models to support subject matter. The instructor gives an explanation of serving size versus portion size, changes in portions sizes over the last 20 years, and what consists of a serving from each food group.   Stress Management:  -Group instruction provided by verbal instruction, video, and written materials to support subject matter.  Instructors review role of stress in heart disease and how to cope with stress positively.     CARDIAC REHAB PHASE II EXERCISE from 04/11/2017 in [place]  Date  01/29/17  Instruction Review Code  2- meets goals/outcomes      Exercising on Your Own:  -Group instruction provided by verbal instruction, power point,  and written materials to support subject.  Instructors discuss benefits of exercise, components of exercise, frequency and intensity of exercise, and end points for exercise.  Also discuss use of nitroglycerin and activating EMS.  Review options of places to exercise outside of rehab.  Review guidelines for sex with heart disease.   CARDIAC REHAB PHASE II EXERCISE from 04/11/2017 in [place]  Date  02/19/17  Instruction Review Code  2- meets goals/outcomes      Cardiac Drugs I:  -Group instruction provided by verbal instruction and written materials to support subject.  Instructor reviews cardiac drug classes: antiplatelets, anticoagulants, beta blockers, and statins.  Instructor discusses reasons, side effects, and lifestyle considerations for each drug class.   Cardiac Drugs II:  -Group instruction provided by verbal instruction and written materials to support subject.  Instructor reviews cardiac drug classes: angiotensin converting enzyme inhibitors (ACE-I), angiotensin II receptor blockers (ARBs), nitrates, and calcium channel blockers.  Instructor discusses reasons, side effects, and lifestyle considerations for each drug class.   CARDIAC REHAB PHASE II EXERCISE from 04/11/2017 in [place]  Date  02/12/17  Instruction Review Code  2- meets goals/outcomes      Anatomy and Physiology of the Circulatory System:  Group verbal and written instruction and models provide basic cardiac anatomy and physiology, with the coronary electrical and arterial systems. Review of: AMI, Angina, Valve disease, Heart Failure, Peripheral Artery Disease, Cardiac Arrhythmia, Pacemakers, and the ICD.   Other Education:  -Group or individual verbal, written, or video instructions that support the educational goals of the cardiac rehab program.   Knowledge Questionnaire Score:   Core Components/Risk Factors/Patient Goals at Admission:      Personal Goals and Risk Factors at Admission - 02/17/17 1127      Core Components/Risk Factors/Patient Goals on Admission    Weight Management Yes;Weight Loss   Intervention Weight Management: Develop a combined nutrition  and exercise program designed to reach desired caloric intake, while maintaining appropriate intake of nutrient and fiber, sodium and fats, and appropriate energy expenditure required for the weight goal.   Admit Weight 214 lb (97.1 kg)   Goal Weight: Short Term 208 lb (94.3 kg)   Goal Weight: Long Term 190 lb (86.2 kg)   Expected Outcomes Short Term: Continue to assess and modify interventions until short term weight is achieved;Long Term: Adherence to nutrition and physical activity/exercise program aimed toward attainment of established weight goal;Weight Loss: Understanding of general recommendations for a balanced deficit meal plan, which promotes 1-2 lb weight loss per week and includes a negative energy balance of [phone] kcal/d;Understanding recommendations for meals to include 15-35% energy as protein, 25-35% energy from fat, 35-60% energy from carbohydrates, less than 200mg  of dietary cholesterol, 20-35 gm of total fiber daily;Understanding of distribution of calorie intake throughout the day with the consumption of 4-5 meals/snacks   Lipids Yes   Intervention Provide education and support for participant on nutrition & aerobic/resistive exercise along with prescribed medications to achieve LDL 70mg , HDL >40mg .   Expected Outcomes Short Term: Participant states understanding of desired cholesterol values and is compliant with medications prescribed. Participant is following exercise prescription and nutrition guidelines.;Long Term: Cholesterol controlled with medications as prescribed, with individualized exercise RX and with personalized nutrition plan. Value goals: LDL < 70mg , HDL > 40 mg.   Personal Goal Other Yes   Personal Goal To lose 20 lbs      Core  Components/Risk Factors/Patient Goals Review:      Goals and Risk Factor Review    Row Name 02/17/17 1126 04/17/17 1058           Core Components/Risk Factors/Patient Goals Review   Personal Goals Review Weight Management/Obesity Weight Management/Obesity;Lipids      Review Pt has lost 2 lb from admission wt of 214 lb.  Ronalee Belts continues to do well with exercise. Mike's vital signs have been stable      Expected Outcomes Continue to encourage slow wt loss of 1-2 lb/week. Ronalee Belts will continue to exercise at cardiac rehab and take statin as prescribed         Core Components/Risk Factors/Patient Goals at Discharge (Final Review):      Goals and Risk Factor Review - 04/17/17 1058      Core Components/Risk Factors/Patient Goals Review   Personal Goals Review Weight Management/Obesity;Lipids   Review Ronalee Belts continues to do well with exercise. Mike's vital signs have been stable   Expected Outcomes Ronalee Belts will continue to exercise at cardiac rehab and take statin as prescribed      ITP Comments:   Comments: Ronalee Belts is making expected progress toward personal goals after completing 26 sessions. Recommend continued exercise and life style modification education including  stress management and relaxation techniques to decrease cardiac risk profile. Ronalee Belts is doing well with exercise and weight loss. Ronalee Belts will be completing exercise at cardiac rehab next week.Barnet Pall, RN,BSN 04/17/2017 11:06 AM

## 2017-04-18 ENCOUNTER — Encounter (HOSPITAL_COMMUNITY)
Admission: RE | Admit: 2017-04-18 | Discharge: 2017-04-18 | Disposition: A | Discharge status: Home / Self Care | Payer: Medicare Other | Source: Ambulatory Visit | Attending: Cardiovascular Disease | Admitting: Cardiovascular Disease

## 2017-04-18 DIAGNOSIS — Z955 Presence of coronary angioplasty implant and graft: Secondary | ICD-10-CM

## 2017-04-18 DIAGNOSIS — I213 ST elevation (STEMI) myocardial infarction of unspecified site: Secondary | ICD-10-CM | POA: Diagnosis not present

## 2017-04-18 DIAGNOSIS — I2121 ST elevation (STEMI) myocardial infarction involving left circumflex coronary artery: Secondary | ICD-10-CM

## 2017-04-21 ENCOUNTER — Encounter (HOSPITAL_COMMUNITY)
Admission: RE | Admit: 2017-04-21 | Discharge: 2017-04-21 | Disposition: A | Discharge status: Home / Self Care | Payer: Medicare Other | Source: Ambulatory Visit | Attending: Cardiovascular Disease | Admitting: Cardiovascular Disease

## 2017-04-21 ENCOUNTER — Ambulatory Visit: Payer: Medicare Other | Admitting: Cardiovascular Disease

## 2017-04-21 DIAGNOSIS — I213 ST elevation (STEMI) myocardial infarction of unspecified site: Secondary | ICD-10-CM | POA: Diagnosis not present

## 2017-04-21 DIAGNOSIS — Z955 Presence of coronary angioplasty implant and graft: Secondary | ICD-10-CM | POA: Diagnosis not present

## 2017-04-21 DIAGNOSIS — I2121 ST elevation (STEMI) myocardial infarction involving left circumflex coronary artery: Secondary | ICD-10-CM

## 2017-04-22 DIAGNOSIS — Z23 Encounter for immunization: Secondary | ICD-10-CM | POA: Diagnosis not present

## 2017-04-23 ENCOUNTER — Encounter (HOSPITAL_COMMUNITY)
Admission: RE | Admit: 2017-04-23 | Discharge: 2017-04-23 | Disposition: A | Discharge status: Home / Self Care | Payer: Medicare Other | Source: Ambulatory Visit | Attending: Cardiovascular Disease | Admitting: Cardiovascular Disease

## 2017-04-23 DIAGNOSIS — Z955 Presence of coronary angioplasty implant and graft: Secondary | ICD-10-CM

## 2017-04-23 DIAGNOSIS — I2121 ST elevation (STEMI) myocardial infarction involving left circumflex coronary artery: Secondary | ICD-10-CM

## 2017-04-23 DIAGNOSIS — I213 ST elevation (STEMI) myocardial infarction of unspecified site: Secondary | ICD-10-CM | POA: Diagnosis not present

## 2017-04-25 ENCOUNTER — Encounter: Payer: Self-pay | Admitting: Family Medicine

## 2017-04-28 ENCOUNTER — Encounter: Payer: Self-pay | Admitting: Gastroenterology

## 2017-04-30 ENCOUNTER — Encounter (HOSPITAL_COMMUNITY)
Admission: RE | Admit: 2017-04-30 | Discharge: 2017-04-30 | Disposition: A | Discharge status: Home / Self Care | Payer: Medicare Other | Source: Ambulatory Visit | Attending: Cardiovascular Disease | Admitting: Cardiovascular Disease

## 2017-04-30 DIAGNOSIS — H01022 Squamous blepharitis right lower eyelid: Secondary | ICD-10-CM | POA: Diagnosis not present

## 2017-04-30 DIAGNOSIS — H01021 Squamous blepharitis right upper eyelid: Secondary | ICD-10-CM | POA: Diagnosis not present

## 2017-04-30 DIAGNOSIS — H02831 Dermatochalasis of right upper eyelid: Secondary | ICD-10-CM | POA: Diagnosis not present

## 2017-04-30 DIAGNOSIS — H02834 Dermatochalasis of left upper eyelid: Secondary | ICD-10-CM | POA: Diagnosis not present

## 2017-04-30 DIAGNOSIS — I2121 ST elevation (STEMI) myocardial infarction involving left circumflex coronary artery: Secondary | ICD-10-CM

## 2017-04-30 DIAGNOSIS — H01025 Squamous blepharitis left lower eyelid: Secondary | ICD-10-CM | POA: Diagnosis not present

## 2017-04-30 DIAGNOSIS — Z955 Presence of coronary angioplasty implant and graft: Secondary | ICD-10-CM | POA: Diagnosis not present

## 2017-04-30 DIAGNOSIS — Z961 Presence of intraocular lens: Secondary | ICD-10-CM | POA: Diagnosis not present

## 2017-04-30 DIAGNOSIS — H01024 Squamous blepharitis left upper eyelid: Secondary | ICD-10-CM | POA: Diagnosis not present

## 2017-04-30 DIAGNOSIS — I213 ST elevation (STEMI) myocardial infarction of unspecified site: Secondary | ICD-10-CM | POA: Diagnosis not present

## 2017-05-02 ENCOUNTER — Encounter (HOSPITAL_COMMUNITY)
Admission: RE | Admit: 2017-05-02 | Discharge: 2017-05-02 | Disposition: A | Discharge status: Home / Self Care | Payer: Medicare Other | Source: Ambulatory Visit | Attending: Cardiovascular Disease | Admitting: Cardiovascular Disease

## 2017-05-02 DIAGNOSIS — Z955 Presence of coronary angioplasty implant and graft: Secondary | ICD-10-CM | POA: Diagnosis not present

## 2017-05-02 DIAGNOSIS — I213 ST elevation (STEMI) myocardial infarction of unspecified site: Secondary | ICD-10-CM | POA: Diagnosis not present

## 2017-05-02 DIAGNOSIS — I2121 ST elevation (STEMI) myocardial infarction involving left circumflex coronary artery: Secondary | ICD-10-CM

## 2017-05-05 ENCOUNTER — Encounter (HOSPITAL_COMMUNITY)
Admission: RE | Admit: 2017-05-05 | Discharge: 2017-05-05 | Disposition: A | Discharge status: Home / Self Care | Payer: Medicare Other | Source: Ambulatory Visit | Attending: Cardiovascular Disease | Admitting: Cardiovascular Disease

## 2017-05-05 VITALS — BP 124/80 | HR 62 | Ht 72.0 in | Wt 209.7 lb

## 2017-05-05 DIAGNOSIS — Z955 Presence of coronary angioplasty implant and graft: Secondary | ICD-10-CM

## 2017-05-05 DIAGNOSIS — I2121 ST elevation (STEMI) myocardial infarction involving left circumflex coronary artery: Secondary | ICD-10-CM

## 2017-05-05 DIAGNOSIS — I213 ST elevation (STEMI) myocardial infarction of unspecified site: Secondary | ICD-10-CM | POA: Insufficient documentation

## 2017-05-05 NOTE — Progress Notes (Signed)
Discharge Progress Report  Patient Details  Name: Ryan [name] MRN: [id number] Date of Birth: 12/21/1945 Referring Provider:     CARDIAC REHAB PHASE II ORIENTATION from 01/16/2017 in [place]  Referring Provider  Sherren Mocha MD       Number of Visits: 32  Reason for Discharge:  Patient independent in their exercise.  Smoking History:  History  Smoking Status  . Never Smoker  Smokeless Tobacco  . Never Used    Diagnosis:  ST elevation myocardial infarction involving left circumflex coronary artery (HCC)  S/P drug eluting coronary stent placement  ADL UCSD:   Initial Exercise Prescription:     Initial Exercise Prescription - 01/16/17 1400      Date of Initial Exercise RX and Referring Provider   Date 01/16/17   Referring Provider Sherren Mocha MD     Treadmill   MPH 2.5   Grade 0   Minutes 10   METs 3     Bike   Level 0.8   Minutes 10   METs 3     NuStep   Level 3   SPM 85   Minutes 10   METs 3     Prescription Details   Frequency (times per week) 3   Duration Progress to 45 minutes of aerobic exercise without signs/symptoms of physical distress     Intensity   THRR 40-80% of Max Heartrate 59-118   Ratings of Perceived Exertion 11-13   Perceived Dyspnea 0-4     Progression   Progression Continue to progress workloads to maintain intensity without signs/symptoms of physical distress.     Resistance Training   Training Prescription Yes   Weight 3   Reps 10-15      Discharge Exercise Prescription (Final Exercise Prescription Changes):     Exercise Prescription Changes - 05/05/17 1200      Response to Exercise   Blood Pressure (Admit) 124/80   Blood Pressure (Exercise) 138/80   Blood Pressure (Exit) 122/80   Heart Rate (Admit) 62 bpm   Heart Rate (Exercise) 114 bpm   Heart Rate (Exit) 64 bpm   Rating of Perceived Exertion (Exercise) 12   Symptoms none   Duration Progress to 45 minutes of  aerobic exercise without signs/symptoms of physical distress   Intensity THRR unchanged     Progression   Progression Continue to progress workloads to maintain intensity without signs/symptoms of physical distress.   Average METs 4.7     Resistance Training   Training Prescription Yes   Weight 6lb   Reps 10-15   Time 10 Minutes     Interval Training   Interval Training No     Treadmill   MPH 4   Grade 3   Minutes 10   METs 5.72     Bike   Level 1   Minutes 10   METs 2.98     Rower   Level 5   Watts 33   Minutes 10   METs 4.74     Home Exercise Plan   Plans to continue exercise at Home (comment)   Frequency Add 4 additional days to program exercise sessions.   Initial Home Exercises Provided 02/10/17      Functional Capacity:     6 Minute Walk    Row Name 01/16/17 1450 05/05/17 1012       6 Minute Walk   Phase Initial Discharge    Distance 1615 feet 1818 feet  Distance % Change  - 12.57 %    Distance Feet Change  - 203 ft    Walk Time 6 minutes 6 minutes    # of Rest Breaks 0 0    MPH 3.1 3.44    METS 3 3.91    RPE 9 11    VO2 Peak 10.4 13.67    Symptoms No No    Resting HR 65 bpm 62 bpm    Resting BP 124/80 124/80    Max Ex. HR 83 bpm 114 bpm    Max Ex. BP 100/70 138/80    2 Minute Post BP 118/60  -       Psychological, QOL, Others - Outcomes: PHQ 2/9: Depression screen [facility] 2/9 05/05/2017 01/20/2017 08/28/2016 06/05/2015 05/17/2014  Decreased Interest 0 0 0 0 0  Down, Depressed, Hopeless 0 0 0 0 0  PHQ - 2 Score 0 0 0 0 0    Quality of Life:     Quality of Life - 05/05/17 1218      Quality of Life Scores   Health/Function Pre 23.1 %   Health/Function Post 26.8 %   Health/Function % Change 16.02 %   Socioeconomic Pre 25.93 %   Socioeconomic Post 27.43 %   Socioeconomic % Change  5.78 %   Psych/Spiritual Pre 25.71 %   Psych/Spiritual Post 24.86 %   Psych/Spiritual % Change -3.31 %   Family Pre 28.8 %   Family Post 30 %   Family  % Change 4.17 %   GLOBAL Pre 25.06 %   GLOBAL Post 27 %   GLOBAL % Change 7.74 %      Personal Goals: Goals established at orientation with interventions provided to work toward goal.     Personal Goals and Risk Factors at Admission - 02/17/17 1127      Core Components/Risk Factors/Patient Goals on Admission    Weight Management Yes;Weight Loss   Intervention Weight Management: Develop a combined nutrition and exercise program designed to reach desired caloric intake, while maintaining appropriate intake of nutrient and fiber, sodium and fats, and appropriate energy expenditure required for the weight goal.   Admit Weight 214 lb (97.1 kg)   Goal Weight: Short Term 208 lb (94.3 kg)   Goal Weight: Long Term 190 lb (86.2 kg)   Expected Outcomes Short Term: Continue to assess and modify interventions until short term weight is achieved;Long Term: Adherence to nutrition and physical activity/exercise program aimed toward attainment of established weight goal;Weight Loss: Understanding of general recommendations for a balanced deficit meal plan, which promotes 1-2 lb weight loss per week and includes a negative energy balance of [phone] kcal/d;Understanding recommendations for meals to include 15-35% energy as protein, 25-35% energy from fat, 35-60% energy from carbohydrates, less than 284m of dietary cholesterol, 20-35 gm of total fiber daily;Understanding of distribution of calorie intake throughout the day with the consumption of 4-5 meals/snacks   Lipids Yes   Intervention Provide education and support for participant on nutrition & aerobic/resistive exercise along with prescribed medications to achieve LDL <732m HDL >4059m  Expected Outcomes Short Term: Participant states understanding of desired cholesterol values and is compliant with medications prescribed. Participant is following exercise prescription and nutrition guidelines.;Long Term: Cholesterol controlled with medications as  prescribed, with individualized exercise RX and with personalized nutrition plan. Value goals: LDL < 17m64mDL > 40 mg.   Personal Goal Other Yes   Personal Goal To lose 20 lbs  Personal Goals Discharge:     Goals and Risk Factor Review    Row Name 02/17/17 1126 04/17/17 1058 05/07/17 1000         Core Components/Risk Factors/Patient Goals Review   Personal Goals Review Weight Management/Obesity Weight Management/Obesity;Lipids Weight Management/Obesity     Review Pt has lost 2 lb from admission wt of 214 lb.  Ronalee Belts continues to do well with exercise. Mike's vital signs have been stable Pt has lost 3.9 lb from admission wt of 213.6 lb.      Expected Outcomes Continue to encourage slow wt loss of 1-2 lb/week. Ronalee Belts will continue to exercise at cardiac rehab and take statin as prescribed Continue to encourage slow wt loss of 1-2 lb/week.        Exercise Goals and Review:     Exercise Goals    Row Name 01/16/17 1420             Exercise Goals   Increase Physical Activity Yes       Intervention Provide advice, education, support and counseling about physical activity/exercise needs.;Develop an individualized exercise prescription for aerobic and resistive training based on initial evaluation findings, risk stratification, comorbidities and participant's personal goals.       Expected Outcomes Achievement of increased cardiorespiratory fitness and enhanced flexibility, muscular endurance and strength shown through measurements of functional capacity and personal statement of participant.       Increase Strength and Stamina Yes       Intervention Provide advice, education, support and counseling about physical activity/exercise needs.;Develop an individualized exercise prescription for aerobic and resistive training based on initial evaluation findings, risk stratification, comorbidities and participant's personal goals.       Expected Outcomes Achievement of increased  cardiorespiratory fitness and enhanced flexibility, muscular endurance and strength shown through measurements of functional capacity and personal statement of participant.          Nutrition & Weight - Outcomes:     Pre Biometrics - 01/16/17 1547      Pre Biometrics   Height 6' (1.829 m)   Weight 214 lb 1.1 oz (97.1 kg)   Waist Circumference 43.25 inches   Hip Circumference 43.75 inches   Waist to Hip Ratio 0.99 %   BMI (Calculated) 29.1   Triceps Skinfold 26 mm   % Body Fat 31.6 %   Grip Strength 51 kg   Flexibility 10 in   Single Leg Stand 30 seconds         Post Biometrics - 05/05/17 1012       Post  Biometrics   Height 6' (1.829 m)   Weight 209 lb 10.5 oz (95.1 kg)   Waist Circumference 41.5 inches   Hip Circumference 42.5 inches   Waist to Hip Ratio 0.98 %   BMI (Calculated) 28.43   Triceps Skinfold 22 mm   % Body Fat 29.8 %   Grip Strength 49 kg   Flexibility 12 in   Single Leg Stand 7.03 seconds      Nutrition:     Nutrition Therapy & Goals - 01/24/17 1110      Nutrition Therapy   Diet Therapeutic Lifestyle Changes     Personal Nutrition Goals   Nutrition Goal Pt to identify food quantities necessary to achieve weight loss of 6-20 lb at graduation from cardiac rehab.     Intervention Plan   Intervention Prescribe, educate and counsel regarding individualized specific dietary modifications aiming towards targeted core components such as weight, hypertension,  lipid management, diabetes, heart failure and other comorbidities.   Expected Outcomes Short Term Goal: Understand basic principles of dietary content, such as calories, fat, sodium, cholesterol and nutrients.;Long Term Goal: Adherence to prescribed nutrition plan.      Nutrition Discharge:     Nutrition Assessments - 05/07/17 0959      MEDFICTS Scores   Pre Score 22   Post Score 19   Score Difference -3      Education Questionnaire Score:     Knowledge Questionnaire Score -  05/05/17 1148      Knowledge Questionnaire Score   Pre Score 19/24   Post Score 22/24      Goals reviewed with patient; copy given to patient. Ronalee Belts graduated from cardiac rehab program today with completion of 32 exercise sessions in Phase II. Pt maintained good attendance and progressed nicely during his participation in rehab as evidenced by increased MET level.   Medication list reconciled. Repeat  PHQ score- 0 .  Pt has made significant lifestyle changes and should be commended for his success. Pt feels he has achieved his goals during cardiac rehab.   Pt plans to continue exercise at the [facility]. We are proud of Mikes progress and his weight loss.Barnet Pall, RN,BSN 05/08/2017 11:35 AM

## 2017-06-20 ENCOUNTER — Ambulatory Visit (INDEPENDENT_AMBULATORY_CARE_PROVIDER_SITE_OTHER): Payer: Medicare Other

## 2017-06-20 DIAGNOSIS — I25119 Atherosclerotic heart disease of native coronary artery with unspecified angina pectoris: Secondary | ICD-10-CM | POA: Diagnosis not present

## 2017-06-20 LAB — EXERCISE TOLERANCE TEST
CHL CUP MPHR: 148 {beats}/min
CSEPPHR: 131 {beats}/min
Estimated workload: 7 METS
Exercise duration (min): 5 min
Exercise duration (sec): 0 s
Percent HR: 88 %
RPE: 17
Rest HR: 48 {beats}/min

## 2017-06-23 ENCOUNTER — Telehealth: Payer: Self-pay | Admitting: Cardiovascular Disease

## 2017-06-23 NOTE — Telephone Encounter (Signed)
Informed patient of results and verbal understanding expressed.  Will discuss more at [place] with [place], [place]. He was grateful for call.

## 2017-06-23 NOTE — Progress Notes (Signed)
Cardiology Office Note    Date:  06/24/2017   ID:  Ryan [name], DOB [birth year], MRN [id number]  PCP:  Dorena Cookey, MD  Cardiologist: Dr. Burt Knack  Chief Complaint: 3  Months follow up  History of Present Illness:   Ryan [name] is a 71 y.o. male CAD s/p STEMI with PCI/DES to Lcx (5/18), HLD and hypothyroidism presents for follow up.   He was admitted in 5/18 with inferoposterior STEMI with LCX stenting. Discharged on DAPT with ASA/Brilinta. He was seen in ER 03/2017 for atypical chest pain. Felt atypica chest pain when last seen by Dr. Burt Knack 03/06/17. Continued medical therapy. Recommended exercise stress test before his follow-up visit in 3 months.  ETT 06/20/17: Negative stress test. Fairly poor exercise tolerance. Continue current management and work on graded exercise program per Dr. Burt Knack.   Here today for follow up. He walks on treadmill 3-4 times per week. About 45 minutes each times with incline up to 40. No chest pain or shortness of breath. He also does rolling machine for about 15 minutes after walking. He plays golf on non exercise days. He some time takes Albuterol inhaler prior to exercise. He did was very nervous and did not used his inhaler prior to his stress test. He was worried to have a MI during stress test. No orthopnea, PND, syncope, LE edema or melena.   Past Medical History:  Diagnosis Date  . Allergy   . Anxiety   . Arthritis   . Asthma   . Cataract   . Coronary artery disease 12/08/2016   STEMI, DES Circumflex  . Hyperlipidemia   . Hypothyroidism   . STEMI (ST elevation myocardial infarction) ([place]) 12/08/2016    Past Surgical History:  Procedure Laterality Date  . cataract Bilateral 03/2016  . COLONOSCOPY  10.27.2005  . Left Heart Cath and Coronary Angiography N/A 12/08/2016   Performed by Sherren Mocha, MD at [facility]  . SHOULDER SURGERY Right   . TONSILLECTOMY      Current Medications: Prior to Admission medications     Medication Sig Start Date End Date Taking? Authorizing Provider  acetaminophen (TYLENOL) 325 MG tablet Take 325-650 mg by mouth every 6 (six) hours as needed for mild pain or moderate pain.    [provider]  albuterol (PROVENTIL HFA;VENTOLIN HFA) 108 (90 Base) MCG/ACT inhaler Inhale 2 puffs into the lungs every 6 (six) hours as needed for wheezing. Patient not taking: Reported on 05/05/2017 08/28/16   Dorena Cookey, MD  aspirin EC 81 MG EC tablet Take 1 tablet (81 mg total) by mouth daily. 12/11/16   Delos Haring, PA-C  atorvastatin (LIPITOR) 80 MG tablet Take 1 tablet (80 mg total) by mouth daily at 6 PM. 12/27/16   Laiklynn Raczynski, PA  bisacodyl (DULCOLAX) 5 MG EC tablet Take 5 mg by mouth daily as needed for moderate constipation.    [provider]  Cholecalciferol (VITAMIN D3) 5000 units CAPS Take 1 capsule by mouth every morning.    [provider]  HYDROcodone-homatropine (HYCODAN) 5-1.5 MG/5ML syrup Take 5 mLs by mouth every 8 (eight) hours as needed for cough. Patient not taking: Reported on 05/05/2017 02/07/17   Dorothyann Peng, NP  Melatonin 5 MG TABS Take 5 mg by mouth at bedtime as needed (sleep).    [provider]  nitroGLYCERIN (NITROSTAT) 0.4 MG SL tablet Place 1 tablet (0.4 mg total) under the tongue every 5 (five) minutes x 3 doses  as needed for chest pain. 12/10/16   Delos Haring, PA-C  pantoprazole (PROTONIX) 40 MG tablet Take 1 tablet (40 mg total) by mouth daily. 12/27/16   Sarp Vernier, PA  SYNTHROID 88 MCG tablet Take 1 tablet (88 mcg total) by mouth daily. 01/06/17   Dorena Cookey, MD  tamsulosin (FLOMAX) 0.4 MG CAPS capsule Take 1 capsule (0.4 mg total) by mouth daily. 01/06/17   Dorena Cookey, MD  ticagrelor (BRILINTA) 90 MG TABS tablet Take 1 tablet (90 mg total) by mouth 2 (two) times daily. 12/26/16   Sherren Mocha, MD    Allergies:   Contrast media [iodinated diagnostic agents]; Erythromycin; Niacin and related; and  Penicillins   Social History   Socioeconomic History  . Marital status: Married    Spouse name: None  . Number of children: 2  . Years of education: None  . Highest education level: None  Social Needs  . Financial resource strain: None  . Food insecurity - worry: None  . Food insecurity - inability: None  . Transportation needs - medical: None  . Transportation needs - non-medical: None  Occupational History  . Occupation: retired  Tobacco Use  . Smoking status: Never Smoker  . Smokeless tobacco: Never Used  Substance and Sexual Activity  . Alcohol use: Yes    Comment: occasional  . Drug use: No  . Sexual activity: None  Other Topics Concern  . None  Social History Narrative  . None     Family History:  The patient's family history includes Diabetes in his mother; Diverticulosis in his mother and sister; Heart disease in his sister; Heart failure in his mother; Irritable bowel syndrome in his mother and sister; Lung cancer in his father.   ROS:   Please see the history of present illness.    ROS All other systems reviewed and are negative.   PHYSICAL EXAM:   VS:  BP 110/70   Pulse (!) 54   Ht 6' (1.829 m)   Wt 208 lb 12.8 oz (94.7 kg)   SpO2 97%   BMI 28.32 kg/m    GEN: Well nourished, well developed, in no acute distress  HEENT: normal  Neck: no JVD, carotid bruits, or masses Cardiac: RRR; no murmurs, rubs, or gallops,no edema  Respiratory:  clear to auscultation bilaterally, normal work of breathing GI: soft, nontender, nondistended, + BS MS: no deformity or atrophy  Skin: warm and dry, no rash Neuro:  Alert and Oriented x 3, Strength and sensation are intact Psych: euthymic mood, full affect  Wt Readings from Last 3 Encounters:  06/24/17 208 lb 12.8 oz (94.7 kg)  05/05/17 209 lb 10.5 oz (95.1 kg)  03/18/17 211 lb (95.7 kg)      Studies/Labs Reviewed:   EKG:  EKG is not  ordered today.    Recent Labs: 08/28/2016: TSH 4.63 01/21/2017: ALT  38 03/05/2017: BUN 18; Creatinine, Ser 0.92; Hemoglobin 13.3; Platelets 284; Potassium 4.2; Sodium 138   Lipid Panel    Component Value Date/Time   CHOL 89 (L) 01/21/2017 0904   TRIG 50 01/21/2017 0904   HDL 31 (L) 01/21/2017 0904   CHOLHDL 2.9 01/21/2017 0904   CHOLHDL 4.7 12/08/2016 2029   VLDL 18 12/08/2016 2029   LDLCALC 48 01/21/2017 0904   LDLDIRECT 144.0 05/13/2013 1036    Additional studies/ records that were reviewed today include:   Echocardiogram:  Cardiac Cath 12/08/2016: Conclusion   1. Severe thrombotic stenosis of the left circumflex treated  successfully with Primary PCI using a 3.5x24 mm Promus DES 2. Moderate diffuse LAD, RCA, and ramus intermedius stenoses 3. Normal LV function  Recommend:  Aggressive medical therapy  The patient's residual CAD affects small caliber vessels and will be best managed medically  Aggrastat x 6 hours  DAPT with ASA and brilinta x 12 months without interruption  Anticipate discharge 48 hours if no complications  Indications   Diagnostic Diagram       Post-Intervention Diagram             ASSESSMENT & PLAN:    1. CAD - No chest pain. Intermittent shortness of breath, likely Brillinta related. This does not limiting his lifestyle and wishes to continue. Continue daily exercise regimen and increase as tolerated with PRN albuterol.  - Continue ASA, Brillinta, Statin  2. HLD - 12/08/2016: VLDL 18 01/21/2017: Cholesterol, Total 89; HDL 31; LDL Calculated 48; Triglycerides 50 - Continue  3. Transient elevated BP - Noted during stress test. However normal at home even with exercise. Likely related to anxiety. Normal today. Continue to observe.   Medication Adjustments/Labs and Tests Ordered: Current medicines are reviewed at length with the patient today.  Concerns regarding medicines are outlined above.  Medication changes, Labs and Tests ordered today are listed in the Patient Instructions below. Patient  Instructions  Medication Instructions:  Your physician recommends that you continue on your current medications as directed. Please refer to the Current Medication list given to you today.   Labwork: NONE ORDERED TODAY  Testing/Procedures: NONE ORDERED TODAY   Follow-Up: Your physician wants you to follow-up in: 6 MONTHS WITH DR. Emelda Fear will receive a reminder letter in the mail two months in advance. If you don't receive a letter, please call our office to schedule the follow-up appointment.   Any Other Special Instructions Will Be Listed Below (If Applicable).     If you need a refill on your cardiac medications before your next appointment, please call your pharmacy.      Jarrett Soho, [place]  06/24/2017 11:13 AM    [address] [place] [place] [place], [place], [place]  [address] Phone: [phone]; Fax: [phone]

## 2017-06-23 NOTE — Telephone Encounter (Signed)
-----   Message from Sherren Mocha, MD sent at 06/22/2017  9:40 AM EST ----- Negative stress test. Fairly poor exercise tolerance. Continue current management and work on graded exercise program. thx

## 2017-06-23 NOTE — Telephone Encounter (Signed)
Ryan [name] is returning a call about test Results

## 2017-06-24 ENCOUNTER — Ambulatory Visit: Payer: Medicare Other | Admitting: Physician Assistant

## 2017-06-24 ENCOUNTER — Ambulatory Visit (INDEPENDENT_AMBULATORY_CARE_PROVIDER_SITE_OTHER): Payer: Medicare Other | Admitting: Physician Assistant

## 2017-06-24 ENCOUNTER — Encounter: Payer: Self-pay | Admitting: Physician Assistant

## 2017-06-24 ENCOUNTER — Ambulatory Visit (INDEPENDENT_AMBULATORY_CARE_PROVIDER_SITE_OTHER): Payer: Medicare Other | Admitting: Gastroenterology

## 2017-06-24 ENCOUNTER — Encounter: Payer: Self-pay | Admitting: Gastroenterology

## 2017-06-24 VITALS — BP 114/70 | HR 64 | Ht 71.25 in | Wt 206.5 lb

## 2017-06-24 VITALS — BP 110/70 | HR 54 | Ht 72.0 in | Wt 208.8 lb

## 2017-06-24 DIAGNOSIS — K5901 Slow transit constipation: Secondary | ICD-10-CM | POA: Diagnosis not present

## 2017-06-24 DIAGNOSIS — I2121 ST elevation (STEMI) myocardial infarction involving left circumflex coronary artery: Secondary | ICD-10-CM | POA: Diagnosis not present

## 2017-06-24 DIAGNOSIS — R142 Eructation: Secondary | ICD-10-CM

## 2017-06-24 DIAGNOSIS — I25119 Atherosclerotic heart disease of native coronary artery with unspecified angina pectoris: Secondary | ICD-10-CM | POA: Diagnosis not present

## 2017-06-24 DIAGNOSIS — K219 Gastro-esophageal reflux disease without esophagitis: Secondary | ICD-10-CM

## 2017-06-24 DIAGNOSIS — E785 Hyperlipidemia, unspecified: Secondary | ICD-10-CM | POA: Diagnosis not present

## 2017-06-24 DIAGNOSIS — R0789 Other chest pain: Secondary | ICD-10-CM

## 2017-06-24 DIAGNOSIS — R030 Elevated blood-pressure reading, without diagnosis of hypertension: Secondary | ICD-10-CM | POA: Diagnosis not present

## 2017-06-24 DIAGNOSIS — I209 Angina pectoris, unspecified: Secondary | ICD-10-CM

## 2017-06-24 NOTE — Patient Instructions (Signed)
If you are age 71 or older, your body mass index should be between 23-30. Your Body mass index is 28.6 kg/m. If this is out of the aforementioned range listed, please consider follow up with your Primary Care Provider.  If you are age 80 or younger, your body mass index should be between 19-25. Your Body mass index is 28.6 kg/m. If this is out of the aformentioned range listed, please consider follow up with your Primary Care Provider.   Please follow up with Dr. Loletha Carrow as needed.  Thank you for choosing [facility]  Dr Wilfrid Lund III

## 2017-06-24 NOTE — Patient Instructions (Signed)
Medication Instructions:  Your physician recommends that you continue on your current medications as directed. Please refer to the Current Medication list given to you today.   Labwork: NONE ORDERED TODAY  Testing/Procedures: NONE ORDERED TODAY   Follow-Up: Your physician wants you to follow-up in: 6 MONTHS WITH DR. Emelda Fear will receive a reminder letter in the mail two months in advance. If you don't receive a letter, please call our office to schedule the follow-up appointment.   Any Other Special Instructions Will Be Listed Below (If Applicable).     If you need a refill on your cardiac medications before your next appointment, please call your pharmacy.

## 2017-06-24 NOTE — Progress Notes (Signed)
[facility] Progress Note  Chief Complaint: GERD  Subjective  History:  Ryan [name] follows up for his GERD and chest pain. History is detailed extensively in prior notes from June and August of this year. He is feeling much better, with occasional episodes of brief right-sided nonexertional chest pain or belching that he has come to live with and admits that he has now less anxious about. He just saw his cardiologist yesterday, and an exercise stress test from last week is described below. He denies dysphagia, odynophagia, nausea vomiting or weight loss. Bowel habits are more regular on MiraLAX every other day. He has still been avoiding fatty and red wine, as these seem to be triggers for his chest pain. ROS: No dysuria Respiratory: no dyspnea  The patient's Past Medical, Family and Social History were reviewed and are on file in the EMR.  Objective:  Med list reviewed  Current Outpatient Medications:  .  acetaminophen (TYLENOL) 325 MG tablet, Take 325-650 mg by mouth every 6 (six) hours as needed for mild pain or moderate pain., Disp: , Rfl:  .  albuterol (PROVENTIL HFA;VENTOLIN HFA) 108 (90 Base) MCG/ACT inhaler, Inhale 2 puffs into the lungs every 6 (six) hours as needed for wheezing., Disp: 1 Inhaler, Rfl: 1 .  aspirin EC 81 MG EC tablet, Take 1 tablet (81 mg total) by mouth daily., Disp: 30 tablet, Rfl: 11 .  atorvastatin (LIPITOR) 80 MG tablet, Take 1 tablet (80 mg total) by mouth daily at 6 PM., Disp: 90 tablet, Rfl: 3 .  bisacodyl (DULCOLAX) 5 MG EC tablet, Take 5 mg by mouth daily as needed for moderate constipation., Disp: , Rfl:  .  Cholecalciferol (VITAMIN D3) 5000 units CAPS, Take 1 capsule by mouth every morning., Disp: , Rfl:  .  HYDROcodone-homatropine (HYCODAN) 5-1.5 MG/5ML syrup, Take 5 mLs by mouth every 8 (eight) hours as needed for cough., Disp: 120 mL, Rfl: 0 .  Melatonin 5 MG TABS, Take 5 mg by mouth at bedtime as needed (sleep)., Disp: , Rfl:  .   nitroGLYCERIN (NITROSTAT) 0.4 MG SL tablet, Place 1 tablet (0.4 mg total) under the tongue every 5 (five) minutes x 3 doses as needed for chest pain., Disp: 12 tablet, Rfl: 12 .  pantoprazole (PROTONIX) 40 MG tablet, Take 1 tablet (40 mg total) by mouth daily., Disp: 90 tablet, Rfl: 3 .  polyethylene glycol powder (GLYCOLAX/MIRALAX) powder, Take 17 g by mouth daily., Disp: , Rfl:  .  SYNTHROID 88 MCG tablet, Take 1 tablet (88 mcg total) by mouth daily., Disp: 90 tablet, Rfl: 1 .  tamsulosin (FLOMAX) 0.4 MG CAPS capsule, Take 1 capsule (0.4 mg total) by mouth daily., Disp: 90 capsule, Rfl: 1 .  ticagrelor (BRILINTA) 90 MG TABS tablet, Take 1 tablet (90 mg total) by mouth 2 (two) times daily., Disp: 180 tablet, Rfl: 3  I reviewed his cardiologist's note from yesterday.  Vital signs in last 24 hrs: Vitals:   06/24/17 1124  BP: 114/70  Pulse: 64    Physical Exam    HEENT: sclera anicteric, oral mucosa moist without lesions  Neck: supple, no thyromegaly, JVD or lymphadenopathy  Cardiac: RRR without murmurs, S1S2 heard, no peripheral edema  Pulm: clear to auscultation bilaterally, normal RR and effort noted  Abdomen: soft, no tenderness, with active bowel sounds. No guarding or palpable hepatosplenomegaly.  Skin; warm and dry, no jaundice or rash   Radiologic studies:  Stress  06/20/17:  Poor exercise tolerance - neg  for ischemia  @ASSESSMENTPLANBEGIN @ Assessment: Encounter Diagnoses  Name Primary?  . Gastroesophageal reflux disease, esophagitis presence not specified Yes  . Midsternal chest pain   . Belching   . Slow transit constipation     Overall he is much improved. There is a reflux component to his symptoms, and also some anxiety/visceral hypersensitivity. He admits that for the last few months he has come to appreciate the anxiety component of this. It was understandably life-changing for him to discover that he had multivessel coronary disease and to suffer an  MI.  Plan: Continue current once daily dosing of his pantoprazole. After the holidays, I have encouraged him to decrease it to every other day as any change in symptoms. He would like to see me as needed   Total time 15 minutes face-to-face, over half spent in counseling and coordination of care.   Nelida Meuse III

## 2017-07-07 DIAGNOSIS — D1801 Hemangioma of skin and subcutaneous tissue: Secondary | ICD-10-CM | POA: Diagnosis not present

## 2017-07-07 DIAGNOSIS — L821 Other seborrheic keratosis: Secondary | ICD-10-CM | POA: Diagnosis not present

## 2017-07-07 DIAGNOSIS — L812 Freckles: Secondary | ICD-10-CM | POA: Diagnosis not present

## 2017-07-07 DIAGNOSIS — L820 Inflamed seborrheic keratosis: Secondary | ICD-10-CM | POA: Diagnosis not present

## 2017-07-07 DIAGNOSIS — Z85828 Personal history of other malignant neoplasm of skin: Secondary | ICD-10-CM | POA: Diagnosis not present

## 2017-07-07 DIAGNOSIS — D485 Neoplasm of uncertain behavior of skin: Secondary | ICD-10-CM | POA: Diagnosis not present

## 2017-08-13 ENCOUNTER — Encounter: Payer: Self-pay | Admitting: Family Medicine

## 2017-08-13 ENCOUNTER — Ambulatory Visit (INDEPENDENT_AMBULATORY_CARE_PROVIDER_SITE_OTHER): Payer: Medicare Other | Admitting: Family Medicine

## 2017-08-13 VITALS — BP 108/70 | HR 52 | Temp 97.6°F | Wt 211.0 lb

## 2017-08-13 DIAGNOSIS — M48062 Spinal stenosis, lumbar region with neurogenic claudication: Secondary | ICD-10-CM | POA: Diagnosis not present

## 2017-08-13 MED ORDER — HYDROCODONE-ACETAMINOPHEN 7.5-325 MG/15ML PO SOLN: 15.0000 mL | Freq: Four times a day (QID) | ORAL | 0 refills | Status: DC | PRN

## 2017-08-13 MED ORDER — PREDNISONE 20 MG PO TABS: ORAL_TABLET | ORAL | 1 refills | Status: DC

## 2017-08-13 NOTE — Patient Instructions (Signed)
Prednisone 20 mg,,,,,,,,,,,, 2 now,,,,,,, 2 tonight,,,,,, then starting tomorrow 2 tabs daily for 3 days then taper as outlined  Vicodin,,,,,,,,,, one half tab every 6-8 hours as needed for severe pain  Bedrest 2 days  We will send a consult request to Dr. Arnoldo Morale ASAP

## 2017-08-13 NOTE — Progress Notes (Signed)
Ryan [name] is a 72 year old married male nonsmoker who comes in today for evaluation of severe back pain for 4-6 weeks  He states over the Christmas holidays he was his beach house and moved a washing machine in the toilet. The next day he began having severe back pain. The pain is constant it's a 7 on a daily basis at 10 at times. It's decreased by sitting increased by standing and walking. It's mostly left and right lumbar area rating down his left leg to his toes. He also has some numbness in that left leg.  January 2017 he had an MRI and an evaluation by Dr. Earle Gell. Study shows severe spinal stenosis. He was advised symptomatic therapy and was told to call if things got worse and they would consider a spinal injection.  He called Dr. Arnoldo Morale office yesterday and they cannot see him till April  He's tried bedrest etc. etc. and it doesn't seem to be helping  BP 108/70 (BP Location: Left Arm, Patient Position: Sitting, Cuff Size: Normal)   Pulse (!) 52   Temp 97.6 F (36.4 C) (Oral)   Wt 211 lb (95.7 kg)   BMI 29.22 kg/m  Well-developed well-nourished male in obvious pain  In the standing position there is palpable tenderness nurse throughout the lumbar spine area.  In the supine position legs were of equal length muscle strength reflexes all within normal limits straight leg raising positive left leg 45 right leg 60. Also decreased sensation left leg  #1 severe low back pain secondary to lumbar disc disease and spinal stenosis......... from a general medical point of view I can offer some pain medicine and some steroids however I think he is audibly going to need to go back and see Dr. Arnoldo Morale and have another evaluation

## 2017-09-03 ENCOUNTER — Ambulatory Visit (INDEPENDENT_AMBULATORY_CARE_PROVIDER_SITE_OTHER): Payer: Medicare Other | Admitting: Family Medicine

## 2017-09-03 ENCOUNTER — Encounter: Payer: Self-pay | Admitting: Family Medicine

## 2017-09-03 VITALS — BP 128/76 | HR 56 | Temp 97.7°F | Ht 71.0 in | Wt 213.0 lb

## 2017-09-03 DIAGNOSIS — R351 Nocturia: Secondary | ICD-10-CM | POA: Diagnosis not present

## 2017-09-03 DIAGNOSIS — Z23 Encounter for immunization: Secondary | ICD-10-CM

## 2017-09-03 DIAGNOSIS — M48062 Spinal stenosis, lumbar region with neurogenic claudication: Secondary | ICD-10-CM | POA: Diagnosis not present

## 2017-09-03 DIAGNOSIS — N401 Enlarged prostate with lower urinary tract symptoms: Secondary | ICD-10-CM | POA: Diagnosis not present

## 2017-09-03 DIAGNOSIS — L820 Inflamed seborrheic keratosis: Secondary | ICD-10-CM

## 2017-09-03 DIAGNOSIS — E038 Other specified hypothyroidism: Secondary | ICD-10-CM

## 2017-09-03 DIAGNOSIS — E032 Hypothyroidism due to medicaments and other exogenous substances: Secondary | ICD-10-CM

## 2017-09-03 DIAGNOSIS — I25119 Atherosclerotic heart disease of native coronary artery with unspecified angina pectoris: Secondary | ICD-10-CM

## 2017-09-03 DIAGNOSIS — E039 Hypothyroidism, unspecified: Secondary | ICD-10-CM

## 2017-09-03 DIAGNOSIS — E7800 Pure hypercholesterolemia, unspecified: Secondary | ICD-10-CM | POA: Diagnosis not present

## 2017-09-03 DIAGNOSIS — J302 Other seasonal allergic rhinitis: Secondary | ICD-10-CM | POA: Diagnosis not present

## 2017-09-03 LAB — POCT URINALYSIS DIPSTICK
BILIRUBIN UA: NEGATIVE
Glucose, UA: NEGATIVE
KETONES UA: NEGATIVE
Leukocytes, UA: NEGATIVE
Nitrite, UA: NEGATIVE
PH UA: 6 (ref 5.0–8.0)
Spec Grav, UA: >=1.03 — AB (ref 1.010–1.025)
UROBILINOGEN UA: 0.2 U/dL

## 2017-09-03 LAB — HEPATIC FUNCTION PANEL
ALBUMIN: 4.1 g/dL (ref 3.5–5.2)
ALK PHOS: 82 U/L (ref 39–117)
ALT: 26 U/L (ref 0–53)
AST: 13 U/L (ref 0–37)
BILIRUBIN DIRECT: 0.2 mg/dL (ref 0.0–0.3)
TOTAL PROTEIN: 6.8 g/dL (ref 6.0–8.3)
Total Bilirubin: 1.1 mg/dL (ref 0.2–1.2)

## 2017-09-03 LAB — LIPID PANEL
CHOL/HDL RATIO: 3
CHOLESTEROL: 145 mg/dL (ref 0–200)
HDL: 45.9 mg/dL (ref >39.00)
LDL CALC: 80 mg/dL (ref 0–99)
NonHDL: 98.61
TRIGLYCERIDES: 92 mg/dL (ref 0.0–149.0)
VLDL: 18.4 mg/dL (ref 0.0–40.0)

## 2017-09-03 LAB — TSH: TSH: 2.68 u[IU]/mL (ref 0.35–4.50)

## 2017-09-03 LAB — PSA: PSA: 2.69 ng/mL (ref 0.10–4.00)

## 2017-09-03 MED ORDER — TAMSULOSIN HCL 0.4 MG PO CAPS: 0.4000 mg | ORAL_CAPSULE | Freq: Every day | ORAL | 4 refills | Status: DC

## 2017-09-03 MED ORDER — SYNTHROID 88 MCG PO TABS: 88.0000 ug | ORAL_TABLET | Freq: Every day | ORAL | 4 refills | Status: DC

## 2017-09-03 MED ORDER — HYDROCODONE-ACETAMINOPHEN 10-325 MG PO TABS: ORAL_TABLET | ORAL | 0 refills | Status: DC

## 2017-09-03 MED ORDER — ATORVASTATIN CALCIUM 80 MG PO TABS: 80.0000 mg | ORAL_TABLET | Freq: Every day | ORAL | 3 refills | Status: DC

## 2017-09-03 NOTE — Progress Notes (Signed)
Ryan [name] is a 72 year old married male nonsmoker who comes in today for evaluation of multiple healthcare problems  Cardiovascular-wise he's stable. He had an MI in May 2018. Did well cardiovascular-wise is currently on brelenta 90 mg twice a day.  His biggest problem now is his back. History of spinal stenosis. He's been to neurosurgery in the past. We saw him a couple weeks ago with a flare of his back pain put her at bedrest for 48 hours started some oral prednisone. He's down to a half a tablet Monday Wednesday Friday and does not feel a lot better. He said he got some immediate lesion the first 48 hourswith the prednisone at bedrest but now he's back to square one. I recommend he go back and see his neurosurgeon ASAP  He has a history of hypothyroidism and takes Synthroid 88 g daily  He has a history of hyperlipidemia and he takes an 81 mg baby aspirin and 80 mg of Lipitor daily  He has a history of reflux esophagitis and sees Dr. Mittie Bodo GI. He is on Protonix 40 mg every other day.  Scattered BPH on Flomax 0.4 daily at bedtime.  Vaccinations due a tetanus booster which will be given today.  He gets routine eye care, dental care, colonoscopy and GI 2014 showed a polyp he's due to go back for follow-up this year  Cognitive function normal he cannot exercise because of severe back pain, home health safety reviewed no issues identified, no guns in the house, he does have a healthcare power of attorney and living well  He uses albuterol usually about once a year when he has a flare of his asthma. It usually triggered by a bad cold.  14 point review of systems reviewed and otherwise negative  EKG done in cardiology therefore not repeated  BP 128/76 (BP Location: Left Arm, Patient Position: Sitting, Cuff Size: Normal)   Pulse (!) 56   Temp 97.7 F (36.5 C) (Oral)   Ht 5\' 11"  (1.803 m)   Wt 213 lb (96.6 kg)   BMI 29.71 kg/m  Well-developed well-nourished male no acute distress vital  signs stable he's afebrile HEENT were negative except evidence of bilateral cataract extraction with lens implants. Neck was supple thyroid is not enlarged no carotid bruits. Cardiopulmonary exam normal abdominal exam normal genitalia normal male. Rectal normal stool guaiac-negative prostate smooth nonnodular 1+ BPH  Extremities normal skin normal peripheral pulses normal except for multiple seborrheic keratoses.  #1 coronary artery disease status post MI May 2018...Marland KitchenMarland KitchenMarland Kitchen Asymptomatic continue current meds  #2 hyperlipidemia......... Continue meds check labs  #3 severe low back pain secondary to spinal stenosis unamenable to conservative therapy.........Marland Kitchen reconsult with neurosurgeon  #4 hypothyroidism....... Continues Synthroid recheck labs  #5BPH........ Check PSA continue Flomax  #6 reflux esophagitis....... Tapered to Protonix every other day by his GI doctor Dr. Loletha Carrow.

## 2017-09-03 NOTE — Patient Instructions (Signed)
Labs today......... I will call you if there is anything abnormal  We've put in a request for you to go back to see you neurosurgeon ASAP..........Marland Kitchen Also since you've been there before I would recommend you call them today yourself  Continue current meds

## 2017-09-16 ENCOUNTER — Other Ambulatory Visit: Payer: Self-pay | Admitting: Neurosurgery

## 2017-09-16 DIAGNOSIS — Z6829 Body mass index (BMI) 29.0-29.9, adult: Secondary | ICD-10-CM | POA: Diagnosis not present

## 2017-09-16 DIAGNOSIS — M5442 Lumbago with sciatica, left side: Secondary | ICD-10-CM

## 2017-09-16 DIAGNOSIS — R030 Elevated blood-pressure reading, without diagnosis of hypertension: Secondary | ICD-10-CM | POA: Diagnosis not present

## 2017-09-16 DIAGNOSIS — R1032 Left lower quadrant pain: Secondary | ICD-10-CM | POA: Diagnosis not present

## 2017-09-20 ENCOUNTER — Ambulatory Visit
Admission: RE | Admit: 2017-09-20 | Discharge: 2017-09-20 | Disposition: A | Discharge status: Home / Self Care | Payer: Medicare Other | Source: Ambulatory Visit | Attending: Neurosurgery | Admitting: Neurosurgery

## 2017-09-20 DIAGNOSIS — M48061 Spinal stenosis, lumbar region without neurogenic claudication: Secondary | ICD-10-CM | POA: Diagnosis not present

## 2017-09-20 DIAGNOSIS — M5442 Lumbago with sciatica, left side: Secondary | ICD-10-CM

## 2017-10-21 DIAGNOSIS — M5432 Sciatica, left side: Secondary | ICD-10-CM | POA: Diagnosis not present

## 2017-11-17 DIAGNOSIS — M5442 Lumbago with sciatica, left side: Secondary | ICD-10-CM | POA: Diagnosis not present

## 2017-12-26 DIAGNOSIS — M545 Low back pain: Secondary | ICD-10-CM | POA: Diagnosis not present

## 2017-12-26 DIAGNOSIS — M5416 Radiculopathy, lumbar region: Secondary | ICD-10-CM | POA: Diagnosis not present

## 2018-01-12 ENCOUNTER — Ambulatory Visit (INDEPENDENT_AMBULATORY_CARE_PROVIDER_SITE_OTHER): Payer: Medicare Other | Admitting: Cardiovascular Disease

## 2018-01-12 ENCOUNTER — Encounter: Payer: Self-pay | Admitting: Cardiovascular Disease

## 2018-01-12 VITALS — BP 122/74 | HR 58 | Ht 71.0 in | Wt 208.2 lb

## 2018-01-12 DIAGNOSIS — I2510 Atherosclerotic heart disease of native coronary artery without angina pectoris: Secondary | ICD-10-CM | POA: Diagnosis not present

## 2018-01-12 DIAGNOSIS — I25119 Atherosclerotic heart disease of native coronary artery with unspecified angina pectoris: Secondary | ICD-10-CM | POA: Diagnosis not present

## 2018-01-12 DIAGNOSIS — E785 Hyperlipidemia, unspecified: Secondary | ICD-10-CM | POA: Diagnosis not present

## 2018-01-12 NOTE — Progress Notes (Signed)
Cardiology Office Note Date:  01/12/2018   ID:  Ryan [name], DOB Jul 17, 1946, MRN [id number]  PCP:  Dorena Cookey, MD  Cardiologist:  Sherren Mocha, MD    Chief Complaint  Patient presents with  . Shortness of Breath     History of Present Illness: Ryan [name] is a 72 y.o. male who presents for follow-up of coronary artery disease.  The patient presented in May 2018 with a STEMI involving the left circumflex.  He was treated with primary PCI using a drug-eluting stent.  Comorbid medical conditions include hyperlipidemia and hypothyroidism.  A follow-up exercise treadmill study was negative for ischemia.  The patient is here with his wife today.  He is doing well with no recent episodes of chest pain.  He complains of shortness of breath, primarily on an intermittent basis at rest and with bending forward to tie his shoes.  He really does not have shortness of breath with physical exertion.  No edema, orthopnea, or PND.  No heart palpitations or other complaints.  Past Medical History:  Diagnosis Date  . Allergy   . Anxiety   . Arthritis   . Asthma   . Cataract   . Coronary artery disease 12/08/2016   STEMI, DES Circumflex  . Hyperlipidemia   . Hypothyroidism   . STEMI (ST elevation myocardial infarction) ([facility]) 12/08/2016    Past Surgical History:  Procedure Laterality Date  . cataract Bilateral 03/2016  . COLONOSCOPY  10.27.2005  . LEFT HEART CATH AND CORONARY ANGIOGRAPHY N/A 12/08/2016   Procedure: Left Heart Cath and Coronary Angiography;  Surgeon: Sherren Mocha, MD;  Location: [facility];  Service: Cardiovascular;  Laterality: N/A;  . SHOULDER SURGERY Right   . TONSILLECTOMY      Current Outpatient Medications  Medication Sig Dispense Refill  . acetaminophen (TYLENOL) 325 MG tablet Take 325-650 mg by mouth every 6 (six) hours as needed for mild pain or moderate pain.    Marland Kitchen albuterol (PROVENTIL HFA;VENTOLIN HFA) 108 (90 Base) MCG/ACT inhaler Inhale  2 puffs into the lungs every 6 (six) hours as needed for wheezing. 1 Inhaler 1  . aspirin EC 81 MG EC tablet Take 1 tablet (81 mg total) by mouth daily. 30 tablet 11  . atorvastatin (LIPITOR) 80 MG tablet Take 1 tablet (80 mg total) by mouth daily at 6 PM. 90 tablet 3  . bisacodyl (DULCOLAX) 5 MG EC tablet Take 5 mg by mouth daily as needed for moderate constipation.    . Cholecalciferol (VITAMIN D3) 5000 units CAPS Take 1 capsule by mouth every morning.    Marland Kitchen HYDROcodone-homatropine (HYCODAN) 5-1.5 MG/5ML syrup Take 5 mLs by mouth every 8 (eight) hours as needed for cough. 120 mL 0  . Melatonin 5 MG TABS Take 5 mg by mouth at bedtime as needed (sleep).    . nitroGLYCERIN (NITROSTAT) 0.4 MG SL tablet Place 1 tablet (0.4 mg total) under the tongue every 5 (five) minutes x 3 doses as needed for chest pain. 12 tablet 12  . pantoprazole (PROTONIX) 40 MG tablet Take 1 tablet (40 mg total) by mouth daily. 90 tablet 3  . polyethylene glycol powder (GLYCOLAX/MIRALAX) powder Take 17 g by mouth daily.    Marland Kitchen SYNTHROID 88 MCG tablet Take 1 tablet (88 mcg total) by mouth daily. 90 tablet 4  . tamsulosin (FLOMAX) 0.4 MG CAPS capsule Take 1 capsule (0.4 mg total) by mouth daily. 90 capsule 4   No current facility-administered medications for  this visit.     Allergies:   Contrast media [iodinated diagnostic agents]; Erythromycin; Niacin and related; and Penicillins   Social History:  The patient  reports that he has never smoked. He has never used smokeless tobacco. He reports that he drinks alcohol. He reports that he does not use drugs.   Family History:  The patient's  family history includes Diabetes in his mother; Diverticulosis in his mother and sister; Heart disease in his sister; Heart failure in his mother; Irritable bowel syndrome in his mother and sister; Lung cancer in his father.    ROS:  Please see the history of present illness.  Otherwise, review of systems is positive for easy bruising.  All  other systems are reviewed and negative.    PHYSICAL EXAM: VS:  BP 122/74   Pulse (!) 58   Ht 5\' 11"  (1.803 m)   Wt 208 lb 4 oz (94.5 kg)   SpO2 97%   BMI 29.04 kg/m  , BMI Body mass index is 29.04 kg/m. GEN: Well nourished, well developed, in no acute distress  HEENT: normal  Neck: no JVD, no masses. No carotid bruits Cardiac: RRR without murmur or gallop                Respiratory:  clear to auscultation bilaterally, normal work of breathing GI: soft, nontender, nondistended, + BS MS: no deformity or atrophy  Ext: no pretibial edema, pedal pulses 2+= bilaterally Skin: warm and dry, no rash Neuro:  Strength and sensation are intact Psych: euthymic mood, full affect  EKG:  EKG is ordered today. The ekg ordered today shows sinus bradycardia 56 bpm, otherwise within normal limits.  Recent Labs: 03/05/2017: BUN 18; Creatinine, Ser 0.92; Hemoglobin 13.3; Platelets 284; Potassium 4.2; Sodium 138 09/03/2017: ALT 26; TSH 2.68   Lipid Panel     Component Value Date/Time   CHOL 145 09/03/2017 1057   CHOL 89 (L) 01/21/2017 0904   TRIG 92.0 09/03/2017 1057   HDL 45.90 09/03/2017 1057   HDL 31 (L) 01/21/2017 0904   CHOLHDL 3 09/03/2017 1057   VLDL 18.4 09/03/2017 1057   LDLCALC 80 09/03/2017 1057   LDLCALC 48 01/21/2017 0904   LDLDIRECT 144.0 05/13/2013 1036      Wt Readings from Last 3 Encounters:  01/12/18 208 lb 4 oz (94.5 kg)  09/03/17 213 lb (96.6 kg)  08/13/17 211 lb (95.7 kg)     Cardiac Studies Reviewed: Cardiac Catheterization 12-08-2016: Conclusion   1. Severe thrombotic stenosis of the left circumflex treated successfully with Primary PCI using a 3.5x24 mm Promus DES 2. Moderate diffuse LAD, RCA, and ramus intermedius stenoses 3. Normal LV function  Recommend:  Aggressive medical therapy  The patient's residual CAD affects small caliber vessels and will be best managed medically  Aggrastat x 6 hours  DAPT with ASA and brilinta x 12 months without  interruption  Anticipate discharge 48 hours if no complications  Indications   ST elevation myocardial infarction involving left circumflex coronary artery (HCC) [I21.21 (ICD-10-CM)]  Procedural Details/Technique   Technical Details INDICATION: Acute inferoposterior STEMI - borderline ST changes but suggestive history and positive troponin. Initial EKG in the ED is NOT diagnostic, second EKG has suggestive changes.  PROCEDURAL DETAILS: The right wrist was prepped, draped, and anesthetized with 1% lidocaine. Using the modified Seldinger technique, a 5/6 French Slender sheath was introduced into the right radial artery. 3 mg of verapamil was administered through the sheath, weight-based unfractionated heparin was administered intravenously.  Standard Judkins catheters were used for selective coronary angiography and left ventriculography. PCI is performed. Catheter exchanges were performed over an exchange length guidewire. There were no immediate procedural complications. A TR band was used for radial hemostasis at the completion of the procedure. The patient was transferred to the post catheterization recovery area for further monitoring.    Estimated blood loss <50 mL.  During this procedure the patient was administered the following to achieve and maintain moderate conscious sedation: Versed 2 mg, Fentanyl 25 mcg, while the patient's heart rate, blood pressure, and oxygen saturation were continuously monitored. The period of conscious sedation was 38 minutes, of which I was present face-to-face 100% of this time.  Coronary Findings   Diagnostic  Dominance: Right  Left Main  Vessel is angiographically normal.  Left Anterior Descending  Vessel is small.  Prox LAD to Mid LAD lesion 50% stenosed  Prox LAD to Mid LAD lesion.  Ramus Intermedius  Vessel is small.  Ramus lesion 80% stenosed  Ramus lesion.  Left Circumflex  Prox Cx lesion 95% stenosed  The lesion is eccentric.  Right  Coronary Artery  Mid RCA lesion 50% stenosed  Mid RCA lesion.  Dist RCA lesion 40% stenosed  Dist RCA lesion.  Intervention   Prox Cx lesion  Angioplasty  Lesion crossed with guidewire using a WIRE COUGAR XT ST. 190CM. Pre-stent angioplasty was performed using a BALLOON MOZEC 2.50X14. A STENT PROMUS PREM MR 3.5X24 drug eluting stent was successfully placed. Post-stent angioplasty was not performed. The pre-interventional distal flow is normal (TIMI 3). The post-interventional distal flow is normal (TIMI 3). The intervention was successful . No complications occurred at this lesion. An EBU guide is used. Heparin and aggrastat are used for anticoagulation. A therapeutic ACT is achieved. The lesion is dilated with a 2.5 mm balloon, then stented with a 3.5x24 mm Promus DES.  There is no residual stenosis post intervention.  Wall Motion              Left Heart   Left Ventricle The left ventricular size is normal. The left ventricular systolic function is normal. LV end diastolic pressure is normal. The left ventricular ejection fraction is 55-65% by visual estimate. No regional wall motion abnormalities.  Coronary Diagrams   Diagnostic Diagram       Post-Intervention Diagram          ASSESSMENT AND PLAN: 1.  Coronary artery disease, native vessel, without angina: The patient is now 12 months out from his acute MI treated with PCI.  Recommend long-term antiplatelet therapy with aspirin 81 mg daily.  Advised him to discontinue ticagrelor based on current guidelines.  Continue aggressive medical therapy.  Reviewed his cardiac catheterization findings with him in detail today.  We will continue with medical therapy for his moderate residual CAD.  2.  Hyperlipidemia: Most recent lipids reviewed.  He continues on a high intensity statin drug with atorvastatin 80 mg daily.  Current medicines are reviewed with the patient today.  The patient does not have concerns regarding  medicines.  Labs/ tests ordered today include:   Orders Placed This Encounter  Procedures  . EKG 12-Lead    Disposition:   FU 6 months APP, one year with me  Signed, Sherren Mocha, MD  01/12/2018 3:32 PM    [facility] [address], [place], [place]  [address] Phone: [phone]; Fax: [phone]

## 2018-01-12 NOTE — Patient Instructions (Signed)
Medication Instructions:  1) STOP BRILINTA   Labwork: None  Testing/Procedures: None  Follow-Up: Your provider wants you to follow-up in: 6 months with Dr. Antionette Char assistant. You will receive a reminder letter in the mail two months in advance. If you don't receive a letter, please call our office to schedule the follow-up appointment.    Your provider wants you to follow-up in: 1 year with Dr. Burt Knack. You will receive a reminder letter in the mail two months in advance. If you don't receive a letter, please call our office to schedule the follow-up appointment.    Any Other Special Instructions Will Be Listed Below (If Applicable).     If you need a refill on your cardiac medications before your next appointment, please call your pharmacy.

## 2018-01-26 ENCOUNTER — Other Ambulatory Visit: Payer: Self-pay | Admitting: Physician Assistant

## 2018-05-14 DIAGNOSIS — H02834 Dermatochalasis of left upper eyelid: Secondary | ICD-10-CM | POA: Diagnosis not present

## 2018-05-14 DIAGNOSIS — M5416 Radiculopathy, lumbar region: Secondary | ICD-10-CM | POA: Diagnosis not present

## 2018-05-14 DIAGNOSIS — Z961 Presence of intraocular lens: Secondary | ICD-10-CM | POA: Diagnosis not present

## 2018-05-14 DIAGNOSIS — H02831 Dermatochalasis of right upper eyelid: Secondary | ICD-10-CM | POA: Diagnosis not present

## 2018-05-21 DIAGNOSIS — Z23 Encounter for immunization: Secondary | ICD-10-CM | POA: Diagnosis not present

## 2018-05-21 IMAGING — MR MR LUMBAR SPINE W/O CM
4 of 5 series · 26 of 48 positions shown · non-contrast
Comparison: Lumbar MRI 08/29/2015

CLINICAL DATA: Low back pain with left-sided sciatica.

EXAM:
MRI LUMBAR SPINE WITHOUT CONTRAST
TECHNIQUE: Multiplanar, multisequence MR imaging of the lumbar spine was
performed. No intravenous contrast was administered.

[Series 3: T2 post-contrast · sagittal · 4.0mm · 0.55mm/px · 6 of 13 slices shown]
[im 1/13]
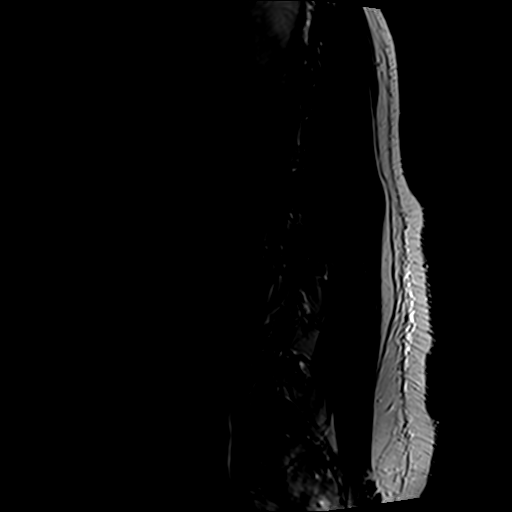
[im 3/13]
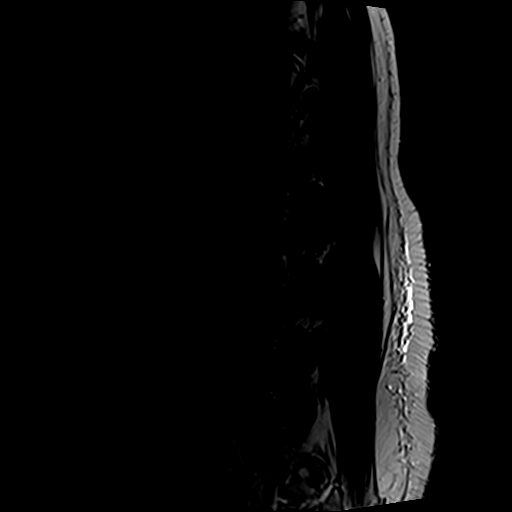
[im 5/13]
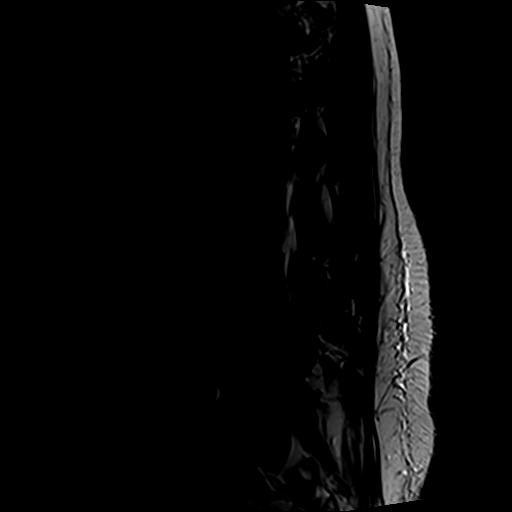
[im 8/13]
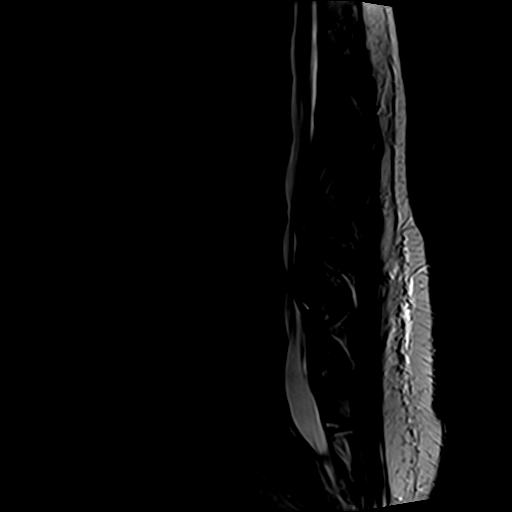
[im 10/13]
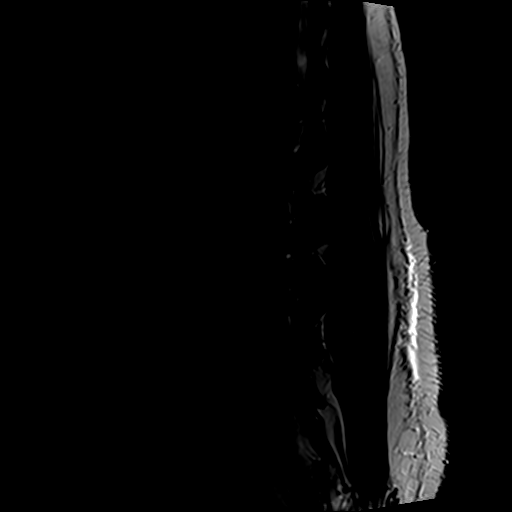
[im 13/13]
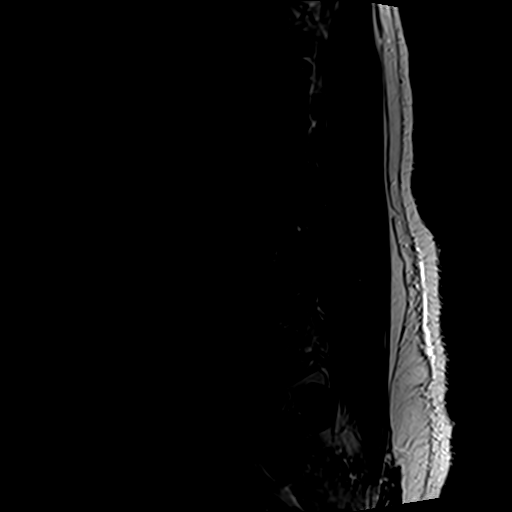

[Series 5: T1 · axial · 4.0mm · 0.35mm/px · z∈[-118,+56]mm · 8 of 35 slices shown (1 of 2)]
[im 1/35]
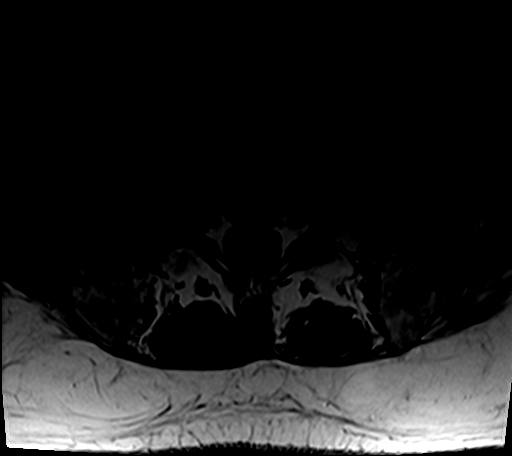
[im 5/35]
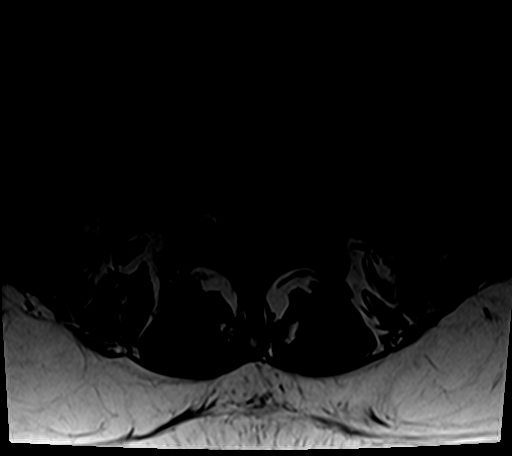
[im 10/35]
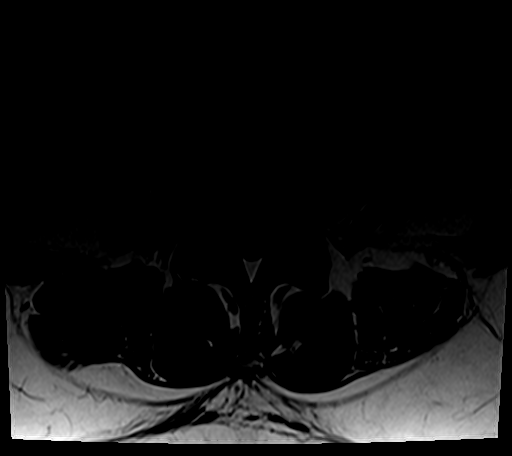
[im 15/35]
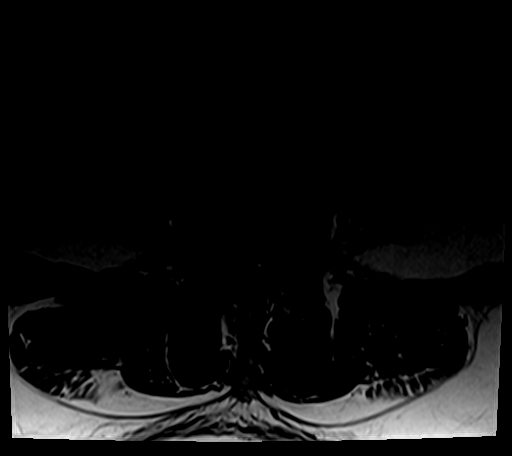
[im 18/35]
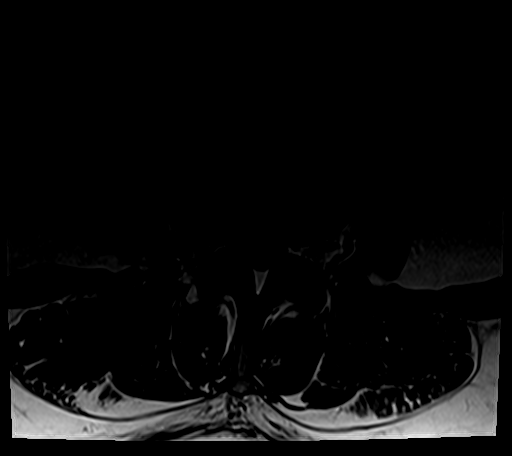
[im 20/35]
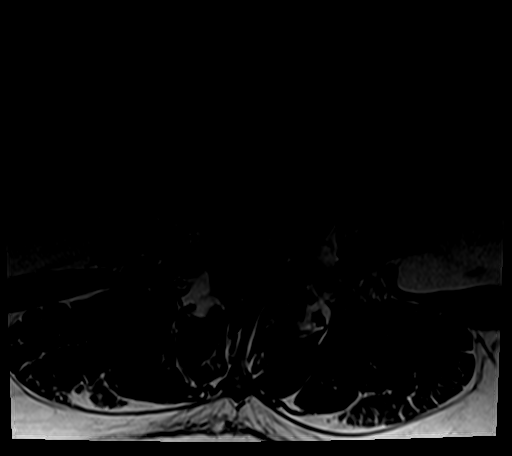
[im 25/35]
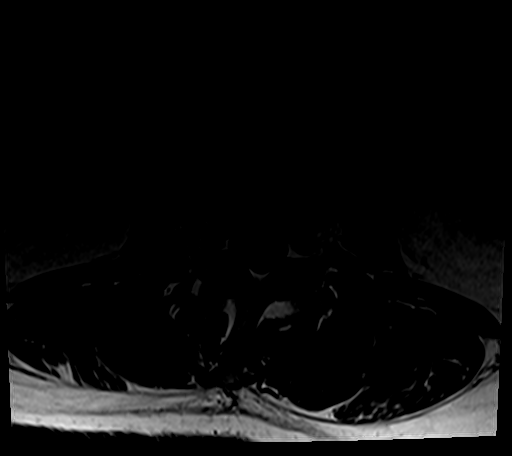
[im 30/35]
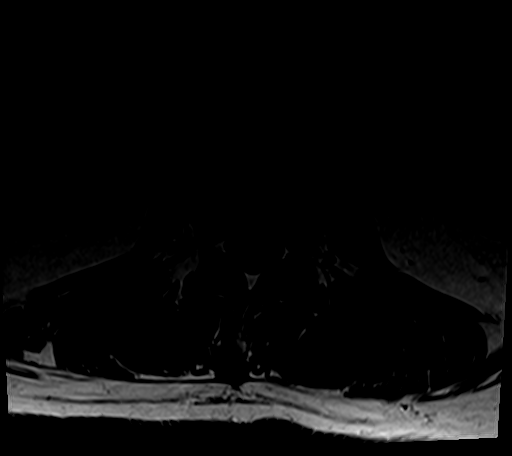

[Series 6: T2 · axial · 4.0mm · 0.70mm/px · z∈[-118,+99]mm · 9 of 35 slices shown]
[im 1/35]
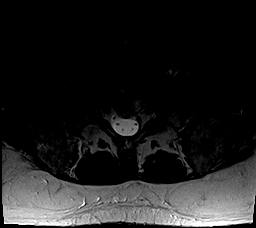
[im 5/35]
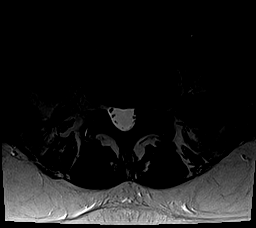
[im 10/35]
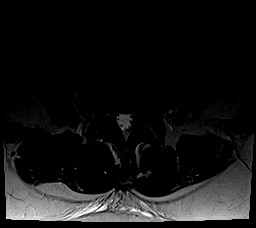
[im 15/35]
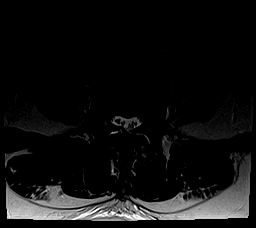
[im 18/35]
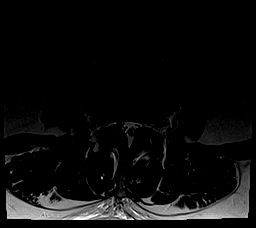
[im 20/35]
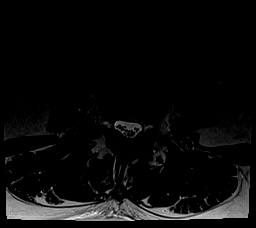
[im 25/35]
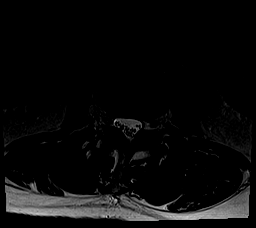
[im 30/35]
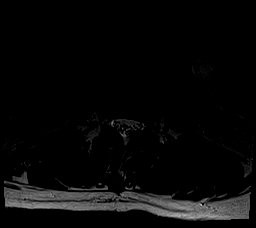
[im 35/35]
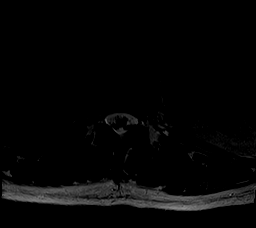

[Series 7: T1 · sagittal · 4.0mm · 0.55mm/px · 3 of 13 slices shown (2 of 2)]
[im 3/13]
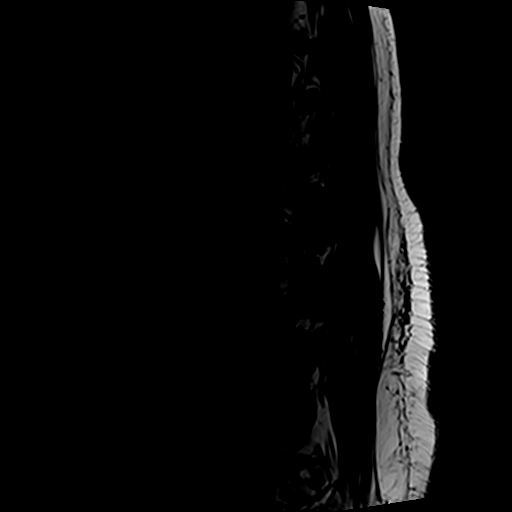
[im 8/13]
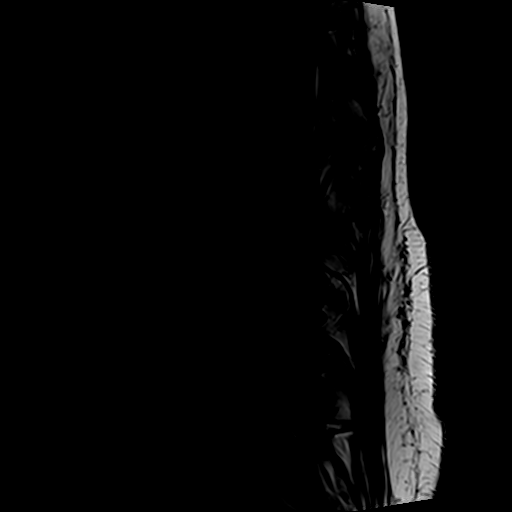
[im 13/13]
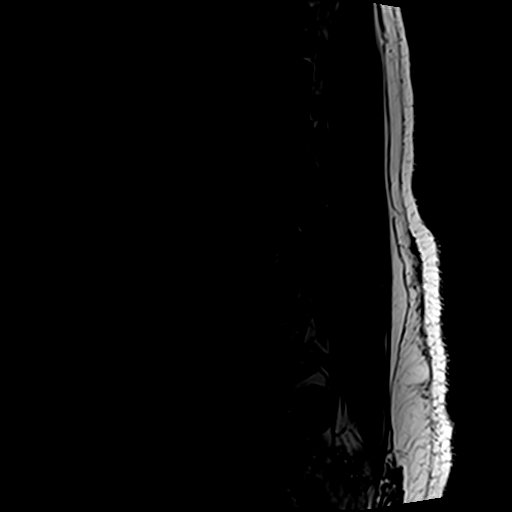

[26 of 48 positions shown; findings below may reference images not displayed]

FINDINGS: Segmentation:  Normal

Alignment:  Mild retrolisthesis L4-5.  Mild anterolisthesis L2-3.

Vertebrae: Negative for fracture or mass. Hemangiomata at L2 and L3
vertebral bodies. Hemangioma S1 vertebral body.

Conus medullaris and cauda equina: Conus extends to the L1-2 level.
Conus and cauda equina appear normal.

Paraspinal and other soft tissues: Left upper pole renal cyst. No
retroperitoneal mass

Disc levels:

L1-2: Mild disc and mild facet degeneration without stenosis

L2-3: Mild anterolisthesis. Moderate facet hypertrophy. No
significant stenosis.

L3-4: Mild disc bulging. Moderate facet and ligamentum flavum
hypertrophy. Mild spinal stenosis bilaterally. Moderate subarticular
stenosis, right greater than left.

L4-5: Mild retrolisthesis. Disc and facet degeneration. Subarticular
stenosis, left greater than right unchanged.

L5-S1: Left lateral disc osteophyte complex unchanged causing
subarticular and foraminal stenosis similar to the prior study.
IMPRESSION: Multilevel degenerative change as above. No significant change from
MRI of 08/29/2015

## 2018-06-01 ENCOUNTER — Encounter: Payer: Self-pay | Admitting: Gastroenterology

## 2018-06-09 ENCOUNTER — Ambulatory Visit: Payer: Medicare Other | Admitting: Gastroenterology

## 2018-06-16 ENCOUNTER — Ambulatory Visit (INDEPENDENT_AMBULATORY_CARE_PROVIDER_SITE_OTHER): Payer: Medicare Other | Admitting: Gastroenterology

## 2018-06-16 ENCOUNTER — Encounter: Payer: Self-pay | Admitting: Gastroenterology

## 2018-06-16 VITALS — BP 120/60 | HR 78 | Ht 72.0 in | Wt 214.0 lb

## 2018-06-16 DIAGNOSIS — I25119 Atherosclerotic heart disease of native coronary artery with unspecified angina pectoris: Secondary | ICD-10-CM

## 2018-06-16 DIAGNOSIS — Z86010 Personal history of colonic polyps: Secondary | ICD-10-CM

## 2018-06-16 DIAGNOSIS — K219 Gastro-esophageal reflux disease without esophagitis: Secondary | ICD-10-CM | POA: Diagnosis not present

## 2018-06-16 DIAGNOSIS — R030 Elevated blood-pressure reading, without diagnosis of hypertension: Secondary | ICD-10-CM | POA: Diagnosis not present

## 2018-06-16 DIAGNOSIS — Z6829 Body mass index (BMI) 29.0-29.9, adult: Secondary | ICD-10-CM | POA: Diagnosis not present

## 2018-06-16 DIAGNOSIS — M5416 Radiculopathy, lumbar region: Secondary | ICD-10-CM | POA: Diagnosis not present

## 2018-06-16 DIAGNOSIS — M545 Low back pain: Secondary | ICD-10-CM | POA: Diagnosis not present

## 2018-06-16 MED ORDER — PEG-KCL-NACL-NASULF-NA ASC-C 140 G PO SOLR: 140.0000 g | ORAL | 0 refills | Status: DC

## 2018-06-16 NOTE — Progress Notes (Signed)
[facility] Progress Note  Chief Complaint: Heartburn and history of colon polyps  Subjective  History:  Ryan [name] was here to see me for the first time in a year.  He was last seen a year ago for evaluation of chest pain which began several months prior.  It persisted somewhat after an admission for MI with coronary stent placement.  He was maintained on once daily PPI, and I felt there was probably some element of anxiety related as well.  At last visit on 06/24/2017, I recommended he try to decrease PPI to every other day. I reviewed most recent cardiology follow-up note from 01/12/2018 by Dr. Burt Knack.  The patient was doing well with no recent episodes of chest pain and no exertional dyspnea. His last colonoscopy was in October 2014 by Dr. Deatra Ina, 1 tubular adenoma and one hyperplastic polyp (each under 10 mm) removed.  Ryan [name] was able to decrease pantoprazole to 20 mg, but still needs to take it every day.  If he decreases every other day, he has recurrence of a sharp left-sided chest pain that is nonexertional.  He denies dysphagia or odynophagia, vomiting or weight loss.  ROS: Anxiety Back pain for which she has been receiving periodic injections Remainder of systems negative except as above  The patient's Past Medical, Family and Social History were reviewed and are on file in the EMR.  Objective:  Med list reviewed  Current Outpatient Medications:  .  acetaminophen (TYLENOL) 325 MG tablet, Take 325-650 mg by mouth every 6 (six) hours as needed for mild pain or moderate pain., Disp: , Rfl:  .  albuterol (PROVENTIL HFA;VENTOLIN HFA) 108 (90 Base) MCG/ACT inhaler, Inhale 2 puffs into the lungs every 6 (six) hours as needed for wheezing., Disp: 1 Inhaler, Rfl: 1 .  aspirin EC 81 MG EC tablet, Take 1 tablet (81 mg total) by mouth daily., Disp: 30 tablet, Rfl: 11 .  atorvastatin (LIPITOR) 80 MG tablet, TAKE 1 TABLET (80 MG TOTAL) DAILY AT 6 PM, Disp: 90 tablet, Rfl: 3 .   bisacodyl (DULCOLAX) 5 MG EC tablet, Take 5 mg by mouth daily as needed for moderate constipation., Disp: , Rfl:  .  Cholecalciferol (VITAMIN D3) 5000 units CAPS, Take 1 capsule by mouth every morning., Disp: , Rfl:  .  HYDROcodone-homatropine (HYCODAN) 5-1.5 MG/5ML syrup, Take 5 mLs by mouth every 8 (eight) hours as needed for cough., Disp: 120 mL, Rfl: 0 .  Melatonin 5 MG TABS, Take 5 mg by mouth at bedtime as needed (sleep)., Disp: , Rfl:  .  nitroGLYCERIN (NITROSTAT) 0.4 MG SL tablet, Place 1 tablet (0.4 mg total) under the tongue every 5 (five) minutes x 3 doses as needed for chest pain., Disp: 12 tablet, Rfl: 12 .  pantoprazole (PROTONIX) 20 MG tablet, Take 20 mg by mouth daily., Disp: , Rfl:  .  polyethylene glycol powder (GLYCOLAX/MIRALAX) powder, Take 17 g by mouth daily., Disp: , Rfl:  .  SYNTHROID 88 MCG tablet, Take 1 tablet (88 mcg total) by mouth daily., Disp: 90 tablet, Rfl: 4 .  tamsulosin (FLOMAX) 0.4 MG CAPS capsule, Take 1 capsule (0.4 mg total) by mouth daily., Disp: 90 capsule, Rfl: 4 .  PEG-KCl-NaCl-NaSulf-Na Asc-C (PLENVU) 140 g SOLR, Take 140 g by mouth as directed., Disp: 1 each, Rfl: 0   Vital signs in last 24 hrs: Vitals:   06/16/18 1622  BP: 120/60  Pulse: 78    Physical Exam  Well-appearing with normal vocal quality  HEENT: sclera anicteric, oral mucosa moist without lesions  Neck: supple, no thyromegaly, JVD or lymphadenopathy  Cardiac: RRR without murmurs, S1S2 heard, no peripheral edema  Pulm: clear to auscultation bilaterally, normal RR and effort noted  Abdomen: soft, no tenderness, with active bowel sounds. No guarding or palpable hepatosplenomegaly.  Rectus diastasis  Skin; warm and dry, no jaundice or rash   @ASSESSMENTPLANBEGIN @ Assessment: Encounter Diagnoses  Name Primary?  . Gastroesophageal reflux disease, esophagitis presence not specified Yes  . Personal history of colonic polyps    GERD without red flag symptoms.  Responds well  to medicines.  He is concerned about whether or not he should continue at this dose, and whether this condition might be causing any serious internal problems.   Plan:  Upper endoscopy Surveillance colonoscopy  He is agreeable after discussion of procedure and risks.  The benefits and risks of the planned procedure were described in detail with the patient or (when appropriate) their health care proxy.  Risks were outlined as including, but not limited to, bleeding, infection, perforation, adverse medication reaction leading to cardiac or pulmonary decompensation, or pancreatitis (if ERCP).  The limitation of incomplete mucosal visualization was also discussed.  No guarantees or warranties were given.   Total time 25 minutes, over half spent face-to-face with patient in counseling and coordination of care.   Nelida Meuse III

## 2018-06-16 NOTE — Patient Instructions (Signed)
If you are age 73 or older, your body mass index should be between 23-30. Your Body mass index is 29.02 kg/m. If this is out of the aforementioned range listed, please consider follow up with your Primary Care Provider.  If you are age 26 or younger, your body mass index should be between 19-25. Your Body mass index is 29.02 kg/m. If this is out of the aformentioned range listed, please consider follow up with your Primary Care Provider.   You have been scheduled for an endoscopy and colonoscopy. Please follow the written instructions given to you at your visit today. Please pick up your prep supplies at the pharmacy within the next 1-3 days. If you use inhalers (even only as needed), please bring them with you on the day of your procedure. Your physician has requested that you go to www.startemmi.com and enter the access code given to you at your visit today. This web site gives a general overview about your procedure. However, you should still follow specific instructions given to you by our office regarding your preparation for the procedure.  It was a pleasure to see you today!  Dr. Loletha Carrow

## 2018-06-18 ENCOUNTER — Other Ambulatory Visit: Payer: Self-pay | Admitting: Family Medicine

## 2018-06-18 DIAGNOSIS — L820 Inflamed seborrheic keratosis: Secondary | ICD-10-CM

## 2018-06-18 DIAGNOSIS — E032 Hypothyroidism due to medicaments and other exogenous substances: Secondary | ICD-10-CM

## 2018-06-18 DIAGNOSIS — E039 Hypothyroidism, unspecified: Secondary | ICD-10-CM

## 2018-06-18 DIAGNOSIS — Z23 Encounter for immunization: Secondary | ICD-10-CM

## 2018-06-18 DIAGNOSIS — J302 Other seasonal allergic rhinitis: Secondary | ICD-10-CM

## 2018-06-18 DIAGNOSIS — E038 Other specified hypothyroidism: Secondary | ICD-10-CM

## 2018-06-19 NOTE — Telephone Encounter (Signed)
Pt aware to follow up with new PCP for further refills due to Dr.Todd retiring. Rx refilled for 90 days. Any other refills will need to come from new PCP.

## 2018-06-25 ENCOUNTER — Encounter: Payer: Medicare Other | Admitting: Family Medicine

## 2018-06-30 ENCOUNTER — Encounter: Payer: Self-pay | Admitting: Gastroenterology

## 2018-06-30 ENCOUNTER — Ambulatory Visit (AMBULATORY_SURGERY_CENTER): Payer: Medicare Other | Admitting: Gastroenterology

## 2018-06-30 VITALS — BP 129/66 | HR 54 | Temp 97.1°F | Resp 12 | Ht 72.0 in | Wt 214.0 lb

## 2018-06-30 DIAGNOSIS — D122 Benign neoplasm of ascending colon: Secondary | ICD-10-CM | POA: Diagnosis not present

## 2018-06-30 DIAGNOSIS — K219 Gastro-esophageal reflux disease without esophagitis: Secondary | ICD-10-CM

## 2018-06-30 DIAGNOSIS — R0789 Other chest pain: Secondary | ICD-10-CM | POA: Diagnosis not present

## 2018-06-30 DIAGNOSIS — Z538 Procedure and treatment not carried out for other reasons: Secondary | ICD-10-CM | POA: Diagnosis not present

## 2018-06-30 DIAGNOSIS — Z1211 Encounter for screening for malignant neoplasm of colon: Secondary | ICD-10-CM | POA: Diagnosis not present

## 2018-06-30 DIAGNOSIS — Z86010 Personal history of colonic polyps: Secondary | ICD-10-CM

## 2018-06-30 HISTORY — PX: COLONOSCOPY: SHX174

## 2018-06-30 MED ORDER — SODIUM CHLORIDE 0.9 % IV SOLN: 500.0000 mL | Freq: Once | INTRAVENOUS | Status: DC

## 2018-06-30 NOTE — Progress Notes (Signed)
A and O x3. Report to RN. Tolerated MAC anesthesia well.Teeth unchanged after procedure.

## 2018-06-30 NOTE — Op Note (Signed)
[facility] Patient Name: Ryan [name] Procedure Date: 06/30/2018 3:24 PM MRN: [id number] Endoscopist: Mallie Mussel L. Loletha Carrow , MD Age: 72 Referring MD:  Date of Birth: Mar 09, 1946 Gender: Male Account #: [id number] Procedure:                Upper GI endoscopy Indications:              Chest pain - persisted after treatment for MI with                            coronary stent placement in 2018, improved on                            omeprazole 20 mg daily Medicines:                Monitored Anesthesia Care Procedure:                Pre-Anesthesia Assessment:                           - Prior to the procedure, a History and Physical                            was performed, and patient medications and                            allergies were reviewed. The patient's tolerance of                            previous anesthesia was also reviewed. The risks                            and benefits of the procedure and the sedation                            options and risks were discussed with the patient.                            All questions were answered, and informed consent                            was obtained. Anticoagulants: The patient has taken                            aspirin. It was decided not to withhold this                            medication prior to the procedure. ASA Grade                            Assessment: II - A patient with mild systemic                            disease. After reviewing the risks and benefits,  the patient was deemed in satisfactory condition to                            undergo the procedure.                           After obtaining informed consent, the endoscope was                            passed under direct vision. Throughout the                            procedure, the patient's blood pressure, pulse, and                            oxygen saturations were monitored continuously. The             Model GIF-[id number]) scope was introduced                            through the mouth, and advanced to the second part                            of duodenum. The upper GI endoscopy was                            accomplished without difficulty. The patient                            tolerated the procedure well. Scope In: Scope Out: Findings:                 The esophagus was normal.                           The stomach was normal.                           The cardia and gastric fundus were normal on                            retroflexion.                           The examined duodenum was normal. Complications:            No immediate complications. Estimated Blood Loss:     Estimated blood loss: none. Impression:               - Normal esophagus.                           - Normal stomach.                           - Normal examined duodenum.                           -  No specimens collected. Recommendation:           - Patient has a contact number available for                            emergencies. The signs and symptoms of potential                            delayed complications were discussed with the                            patient. Return to normal activities tomorrow.                            Written discharge instructions were provided to the                            patient.                           - Resume previous diet.                           - Continue present medications.                           - See the other procedure note for documentation of                            additional recommendations. Maranatha Grossi L. Loletha Carrow, MD 06/30/2018 4:19:09 PM This report has been signed electronically.

## 2018-06-30 NOTE — Op Note (Addendum)
[facility] Patient Name: RILAN EILAND Procedure Date: 06/30/2018 3:24 PM MRN: [id number] Endoscopist: Mallie Mussel L. Loletha Carrow , MD Age: 72 Referring MD:  Date of Birth: February 01, 1946 Gender: Male Account #: [id number] Procedure:                Colonoscopy Indications:              Surveillance: Personal history of adenomatous                            polyps on last colonoscopy 5 years ago (TA < 32mm                            05/2013) Medicines:                Monitored Anesthesia Care Procedure:                Pre-Anesthesia Assessment:                           - Prior to the procedure, a History and Physical                            was performed, and patient medications and                            allergies were reviewed. The patient's tolerance of                            previous anesthesia was also reviewed. The risks                            and benefits of the procedure and the sedation                            options and risks were discussed with the patient.                            All questions were answered, and informed consent                            was obtained. Anticoagulants: The patient has taken                            aspirin. It was decided not to withhold this                            medication prior to the procedure. ASA Grade                            Assessment: II - A patient with mild systemic                            disease. After reviewing the risks and benefits,  the patient was deemed in satisfactory condition to                            undergo the procedure.                           After obtaining informed consent, the colonoscope                            was passed under direct vision. Throughout the                            procedure, the patient's blood pressure, pulse, and                            oxygen saturations were monitored continuously. The   Colonoscope was introduced through the anus and                            advanced to the the cecum, identified by                            appendiceal orifice and ileocecal valve. The                            colonoscopy was somewhat difficult due to                            significant looping. Successful completion of the                            procedure was aided by using manual pressure. The                            patient tolerated the procedure well. The quality                            of the bowel preparation was poor. The ileocecal                            valve, appendiceal orifice, and rectum were                            photographed. The quality of the bowel preparation                            was evaluated using the BBPS [facility] Bowel                            Preparation Scale) with scores of: Right Colon = 1,                            Transverse Colon = 1 and Left Colon = 1. The total  BBPS score equals 3. The bowel preparation used was                            Plenvu. Scope In: 3:31:52 PM Scope Out: 3:57:54 PM Total Procedure Duration: 0 hours 26 minutes 2 seconds  Findings:                 The perianal and digital rectal examinations were                            normal.                           The entire colon was redundant, causing scope                            looping.                           Two sessile polyps were found in the ascending                            colon. The polyps were 4 to 5 mm in size. These                            polyps were removed with a piecemeal technique                            using a cold biopsy forceps. Resection and                            retrieval were complete.                           A large amount of semi-liquid stool was found in                            the entire colon, interfering with visualization.                           A few small-mouthed  diverticula were found in the                            left colon.                           The exam was otherwise without abnormality on                            direct and retroflexion views. Complications:            No immediate complications. Estimated Blood Loss:     Estimated blood loss was minimal. Impression:               - Preparation of the colon was poor.                           -  Redundant colon.                           - Two 4 to 5 mm polyps in the ascending colon,                            removed piecemeal using a cold biopsy forceps.                            Resected and retrieved.                           - Stool in the entire examined colon.                           - Diverticulosis in the left colon.                           - The examination was otherwise normal on direct                            and retroflexion views. Recommendation:           - Patient has a contact number available for                            emergencies. The signs and symptoms of potential                            delayed complications were discussed with the                            patient. Return to normal activities tomorrow.                            Written discharge instructions were provided to the                            patient.                           - Resume previous diet.                           - Continue present medications.                           - Await pathology results.                           - Repeat colonoscopy in 1 year for surveillance due                            to poor preparation. Two-day prep with                            Miralax/Suprep needed.  Kadeen Sroka L. Loletha Carrow, MD 06/30/2018 4:26:15 PM This report has been signed electronically.

## 2018-06-30 NOTE — Progress Notes (Signed)
Called to room to assist during endoscopic procedure.  Patient ID and intended procedure confirmed with present staff. Received instructions for my participation in the procedure from the performing physician.  

## 2018-06-30 NOTE — Patient Instructions (Signed)
Handouts Provided:  Polyps  YOU HAD AN ENDOSCOPIC PROCEDURE TODAY AT THE [facility]:   Refer to the procedure report that was given to you for any specific questions about what was found during the examination.  If the procedure report does not answer your questions, please call your gastroenterologist to clarify.  If you requested that your care partner not be given the details of your procedure findings, then the procedure report has been included in a sealed envelope for you to review at your convenience later.  YOU SHOULD EXPECT: Some feelings of bloating in the abdomen. Passage of more gas than usual.  Walking can help get rid of the air that was put into your GI tract during the procedure and reduce the bloating. If you had a lower endoscopy (such as a colonoscopy or flexible sigmoidoscopy) you may notice spotting of blood in your stool or on the toilet paper. If you underwent a bowel prep for your procedure, you may not have a normal bowel movement for a few days.  Please Note:  You might notice some irritation and congestion in your nose or some drainage.  This is from the oxygen used during your procedure.  There is no need for concern and it should clear up in a day or so.  SYMPTOMS TO REPORT IMMEDIATELY:   Following lower endoscopy (colonoscopy or flexible sigmoidoscopy):  Excessive amounts of blood in the stool  Significant tenderness or worsening of abdominal pains  Swelling of the abdomen that is new, acute  Fever of 100F or higher   Following upper endoscopy (EGD)  Vomiting of blood or coffee ground material  New chest pain or pain under the shoulder blades  Painful or persistently difficult swallowing  New shortness of breath  Fever of 100F or higher  Black, tarry-looking stools  For urgent or emergent issues, a gastroenterologist can be reached at any hour by calling [phone].   DIET:  We do recommend a small meal at first, but then you may proceed  to your regular diet.  Drink plenty of fluids but you should avoid alcoholic beverages for 24 hours.  ACTIVITY:  You should plan to take it easy for the rest of today and you should NOT DRIVE or use heavy machinery until tomorrow (because of the sedation medicines used during the test).    FOLLOW UP: Our staff will call the number listed on your records the next business day following your procedure to check on you and address any questions or concerns that you may have regarding the information given to you following your procedure. If we do not reach you, we will leave a message.  However, if you are feeling well and you are not experiencing any problems, there is no need to return our call.  We will assume that you have returned to your regular daily activities without incident.  If any biopsies were taken you will be contacted by phone or by letter within the next 1-3 weeks.  Please call us at [phone] if you have not heard about the biopsies in 3 weeks.    SIGNATURES/CONFIDENTIALITY: You and/or your care partner have signed paperwork which will be entered into your electronic medical record.  These signatures attest to the fact that that the information above on your After Visit Summary has been reviewed and is understood.  Full responsibility of the confidentiality of this discharge information lies with you and/or your care-partner.  

## 2018-07-01 ENCOUNTER — Telehealth: Payer: Self-pay | Admitting: *Deleted

## 2018-07-01 NOTE — Telephone Encounter (Signed)
  Follow up Call-  Call back number 06/30/2018  Post procedure Call Back phone  # [phone]  Permission to leave phone message Yes  Some recent data might be hidden     Patient questions:  Do you have a fever, pain , or abdominal swelling? No. Pain Score  0 *  Have you tolerated food without any problems? Yes.    Have you been able to return to your normal activities? Yes.    Do you have any questions about your discharge instructions: Diet   No. Medications  No. Follow up visit  No.  Do you have questions or concerns about your Care? No.  Actions: * If pain score is 4 or above: No action needed, pain <4.

## 2018-07-08 ENCOUNTER — Encounter: Payer: Medicare Other | Admitting: Family Medicine

## 2018-07-09 ENCOUNTER — Encounter: Payer: Self-pay | Admitting: Gastroenterology

## 2018-07-13 ENCOUNTER — Encounter: Payer: Self-pay | Admitting: Family Medicine

## 2018-07-13 ENCOUNTER — Ambulatory Visit (INDEPENDENT_AMBULATORY_CARE_PROVIDER_SITE_OTHER): Payer: Medicare Other | Admitting: Family Medicine

## 2018-07-13 VITALS — BP 126/66 | HR 55 | Temp 97.8°F | Ht 72.0 in | Wt 218.8 lb

## 2018-07-13 DIAGNOSIS — R351 Nocturia: Secondary | ICD-10-CM

## 2018-07-13 DIAGNOSIS — M48062 Spinal stenosis, lumbar region with neurogenic claudication: Secondary | ICD-10-CM

## 2018-07-13 DIAGNOSIS — E039 Hypothyroidism, unspecified: Secondary | ICD-10-CM | POA: Diagnosis not present

## 2018-07-13 DIAGNOSIS — Z125 Encounter for screening for malignant neoplasm of prostate: Secondary | ICD-10-CM

## 2018-07-13 DIAGNOSIS — I2510 Atherosclerotic heart disease of native coronary artery without angina pectoris: Secondary | ICD-10-CM

## 2018-07-13 DIAGNOSIS — J4520 Mild intermittent asthma, uncomplicated: Secondary | ICD-10-CM | POA: Diagnosis not present

## 2018-07-13 DIAGNOSIS — E7800 Pure hypercholesterolemia, unspecified: Secondary | ICD-10-CM

## 2018-07-13 DIAGNOSIS — N401 Enlarged prostate with lower urinary tract symptoms: Secondary | ICD-10-CM

## 2018-07-13 MED ORDER — TAMSULOSIN HCL 0.4 MG PO CAPS: 0.8000 mg | ORAL_CAPSULE | Freq: Every day | ORAL | 0 refills | Status: DC

## 2018-07-13 NOTE — Progress Notes (Signed)
   Subjective:  Ryan [name] is a 72 y.o. male who presents today with a chief complaint of BPH and to transfer care to this office  HPI:  BPH Chronic problem.  Several year history.  On Flomax 0.4 mg daily.  Wakes up a few times every night.  Has difficulty starting and stopping urination.  Occasional dribbling.  No noted hematuria.  No low abdominal pain.  No nausea or vomiting.  CAD s/p MI and stenting 03/2018 Follows with cardiology.  Currently on Lipitor 80 mg daily and ASA 81 mg daily.  Tolerating both well.  GERD Chronic problem.  Several year history.  On Protonix 20 mg daily tolerating well.  Hypothyroidism Chronic problem.  On Synthroid 88 mcg daily tolerating well.  Asthma Symptoms currently well controlled.  Uses albuterol as needed.  Occasionally has to use-for severe cough.  Has not had to use this for several months.  Chronic low back pain Follows with orthopedics.  Has had 3 epidural steroid injections within the last year.  ROS: Per HPI  PMH: He reports that he has never smoked. He has never used smokeless tobacco. He reports that he drinks alcohol. He reports that he does not use drugs.  Objective:  Physical Exam: BP 126/66 (BP Location: Left Arm, Patient Position: Sitting, Cuff Size: Large)   Pulse (!) 55   Temp 97.8 F (36.6 C) (Oral)   Ht 6' (1.829 m)   Wt 218 lb 12 oz (99.2 kg)   SpO2 95%   BMI 29.67 kg/m   Gen: NAD, resting comfortably CV: RRR with no murmurs appreciated Pulm: NWOB, CTAB with no crackles, wheezes, or rhonchi MSK: No edema, cyanosis, or clubbing noted Skin: Warm, dry Neuro: Grossly normal, moves all extremities Psych: Normal affect and thought content  Assessment/Plan:  Spinal stenosis of lumbar region with neurogenic claudication No red flags.  Follows with orthopedics.  Hypothyroidism Continue Synthroid 88 mcg daily.  Check TSH with next blood draw.  Hypercholesterolemia Continue Lipitor 80 mg daily.  Check lipid  panel next blood draw.  CAD S/P MI 2018 with cardiac cath: a. Promus DES to left circumflex, moderate diffuse LAD, RCA and ramus intermedius stenosis, Normal LV function Stable.  Continue aspirin and Lipitor 80 mg daily.  Will be following up with cardiology later this month.  BPH associated with nocturia Increase Flomax to 0.8 mg daily.  Discussed potential side effects.  Continues to have significant symptoms, would consider referral to urology.  Preventative Healthcare Patient was instructed to return soon for CPE. Health Maintenance Due  Topic Date Due  . Hepatitis C Screening  [birth year]    Time Spent: I spent >40 minutes face-to-face with the patient, with more than half spent on counseling for treatment plan for his BPH, coronary artery disease, dyslipidemia, hypothyroidism, and chronic low back pain.   Algis Greenhouse. [name], MD 07/13/2018 1:28 PM

## 2018-07-13 NOTE — Assessment & Plan Note (Signed)
Stable.  Continue aspirin and Lipitor 80 mg daily.  Will be following up with cardiology later this month.

## 2018-07-13 NOTE — Assessment & Plan Note (Signed)
No red flags.  Follows with orthopedics.

## 2018-07-13 NOTE — Assessment & Plan Note (Signed)
Continue Synthroid 88 mcg daily.  Check TSH with next blood draw.

## 2018-07-13 NOTE — Assessment & Plan Note (Signed)
Increase Flomax to 0.8 mg daily.  Discussed potential side effects.  Continues to have significant symptoms, would consider referral to urology.

## 2018-07-13 NOTE — Assessment & Plan Note (Signed)
Continue Lipitor 80 mg daily.  Check lipid panel next blood draw.

## 2018-07-13 NOTE — Patient Instructions (Addendum)
It was very nice to see you today!  Please start Flomax 0.8 g daily.  This will be 2 capsules.  Come back to see me in about after 09/03/2017. Please get labs done before our visit.  No other changes today.  Take care, Dr [name]

## 2018-07-16 ENCOUNTER — Other Ambulatory Visit: Payer: Self-pay | Admitting: Cardiovascular Disease

## 2018-07-22 ENCOUNTER — Ambulatory Visit (INDEPENDENT_AMBULATORY_CARE_PROVIDER_SITE_OTHER): Payer: Medicare Other | Admitting: Physician Assistant

## 2018-07-22 ENCOUNTER — Encounter: Payer: Self-pay | Admitting: Physician Assistant

## 2018-07-22 VITALS — BP 132/64 | HR 68 | Ht 72.0 in | Wt 219.1 lb

## 2018-07-22 DIAGNOSIS — E7800 Pure hypercholesterolemia, unspecified: Secondary | ICD-10-CM

## 2018-07-22 DIAGNOSIS — R001 Bradycardia, unspecified: Secondary | ICD-10-CM

## 2018-07-22 DIAGNOSIS — I2510 Atherosclerotic heart disease of native coronary artery without angina pectoris: Secondary | ICD-10-CM

## 2018-07-22 MED ORDER — NITROGLYCERIN 0.4 MG SL SUBL: 0.4000 mg | SUBLINGUAL_TABLET | SUBLINGUAL | 11 refills | Status: DC | PRN

## 2018-07-22 NOTE — Progress Notes (Signed)
Cardiology Office Note:    Date:  07/22/2018   ID:  Ryan [name], DOB July 17, 1946, MRN [id number]  PCP:  Vivi Barrack, MD  Cardiologist:  Sherren Mocha, MD   Electrophysiologist:  None   Referring MD: Dorena Cookey, MD   Chief Complaint  Patient presents with  . Follow-up    CAD     History of Present Illness:    Ryan [name] is a 72 y.o. male with coronary artery disease status post ST elevation myocardial infarction treated with a drug-eluting stent to the LCx in May 2018, hyperlipidemia.  He was last seen by Dr. Burt Knack in June 2019.   Ryan [name] returns for follow up.  He is overall doing well.  He is mainly bothered by back pain with radicular symptoms.  He has had several ESI with some relief.  He is unable to exercise as much.  He is fatigued.  He denies chest pain, shortness of breath, syncope, paroxysmal nocturnal dyspnea, leg swelling.  He does note his HR is low at times and wonders if it could be causing a problem.   Prior CV studies:   The following studies were reviewed today:  Exercise tolerance test 06/20/2017 Hypertensive response and mildly reduced exercise tolerance. Otherwise normal ECG stress test.  Cardiac catheterization 12/08/2016 Left main normal LAD proximal 50 RI 80 LCx proximal 95 RCA mid 50, distal 40 EF 55-65 PCI: 3.5 x 24 mm Promus Premier DES to the proximal LCx 1. Severe thrombotic stenosis of the left circumflex treated successfully with Primary PCI using a 3.5x24 mm Promus DES 2. Moderate diffuse LAD, RCA, and ramus intermedius stenoses 3. Normal LV function Recommend:  Aggressive medical therapy  The patient's residual CAD affects small caliber vessels and will be best managed medically  Aggrastat x 6 hours  DAPT with ASA and brilinta x 12 months without interruption  Anticipate discharge 48 hours if no complications  Past Medical History:  Diagnosis Date  . Allergy   . Anxiety   . Arthritis   . Asthma   .  Cataract   . Coronary artery disease 12/08/2016   STEMI, DES Circumflex  . Hyperlipidemia   . Hypothyroidism   . STEMI (ST elevation myocardial infarction) ([place]) 12/08/2016   Surgical Hx: The patient  has a past surgical history that includes Colonoscopy (10.27.2005); Shoulder surgery (Right); Tonsillectomy; cataract (Bilateral, 03/2016); LEFT HEART CATH AND CORONARY ANGIOGRAPHY (N/A, 12/08/2016); and epidural steroid shot.   Current Medications: No outpatient medications have been marked as taking for the 07/22/18 encounter (Office Visit) with Richardson Dopp T, PA-C.     Allergies:   Contrast media [iodinated diagnostic agents]; Erythromycin; Niacin and related; and Penicillins   Social History   Tobacco Use  . Smoking status: Never Smoker  . Smokeless tobacco: Never Used  Substance Use Topics  . Alcohol use: Yes    Comment: occasional  . Drug use: No     Family Hx: The patient's family history includes Diabetes in his mother; Diverticulosis in his mother and sister; Heart disease in his sister; Heart failure in his mother; Irritable bowel syndrome in his mother and sister; Lung cancer in his father. There is no history of Colon cancer, Esophageal cancer, Pancreatic cancer, or Stomach cancer.  ROS:   Please see the history of present illness.    Review of Systems  Musculoskeletal: Positive for back pain.  Genitourinary: Positive for incomplete emptying.   All other systems reviewed and are negative.  EKGs/Labs/Other Test Reviewed:    EKG:  EKG is  ordered today.  The ekg ordered today demonstrates normal sinus rhythm, HR 65, normal axis, QTc 426, no change from prior ECG  Recent Labs: 09/03/2017: ALT 26; TSH 2.68   Recent Lipid Panel Lab Results  Component Value Date/Time   CHOL 145 09/03/2017 10:57 AM   CHOL 89 (L) 01/21/2017 09:04 AM   TRIG 92.0 09/03/2017 10:57 AM   HDL 45.90 09/03/2017 10:57 AM   HDL 31 (L) 01/21/2017 09:04 AM   CHOLHDL 3 09/03/2017 10:57 AM    LDLCALC 80 09/03/2017 10:57 AM   LDLCALC 48 01/21/2017 09:04 AM   LDLDIRECT 144.0 05/13/2013 10:36 AM    Physical Exam:    VS:  BP 132/64   Pulse 68   Ht 6' (1.829 m)   Wt 219 lb 1.9 oz (99.4 kg)   SpO2 95%   BMI 29.72 kg/m     Wt Readings from Last 3 Encounters:  07/22/18 219 lb 1.9 oz (99.4 kg)  07/13/18 218 lb 12 oz (99.2 kg)  06/30/18 214 lb (97.1 kg)     Physical Exam  Constitutional: He is oriented to person, place, and time. He appears well-developed and well-nourished. No distress.  HENT:  Head: Normocephalic and atraumatic.  Neck: Neck supple. No JVD present. Carotid bruit is not present.  Cardiovascular: Normal rate, regular rhythm, S1 normal and S2 normal.  No murmur heard. Pulmonary/Chest: Breath sounds normal. He has no rales.  Abdominal: Soft. There is no hepatomegaly.  Musculoskeletal:        General: No edema.  Neurological: He is alert and oriented to person, place, and time.  Skin: Skin is warm and dry.    ASSESSMENT & PLAN:    Coronary artery disease involving native coronary artery of native heart without angina pectoris Hx of STEMI in 2018 tx with DES to LCx.  He is doing well without angina.  Continue ASA, statin.  Hypercholesterolemia Lipid panel from 08/2017 reviewed.  His LDL was 80.  It had previously been as low as 48.  He has a CPE with his PCP in 08/2018.  If his LDL remains > 70, we could consider changing Atorvastatin to Rosuvastatin.  Continue current therapy for now.  Bradycardia He notices his HR running in the 50s at times.  It has some fatigue but no specific symptoms related to bradycardia.  We discussed proceeding with a 48 Hour Holter to rule out significant bradycardia or pauses.  He prefers to hold off for now but will call if he notices any changes.     Dispo:  Return in about 6 months (around 01/21/2019) for Routine Follow Up, w/ Dr. Burt Knack.   Medication Adjustments/Labs and Tests Ordered: Current medicines are reviewed at  length with the patient today.  Concerns regarding medicines are outlined above.  Tests Ordered: Orders Placed This Encounter  Procedures  . EKG 12-Lead   Medication Changes: Meds ordered this encounter  Medications  . nitroGLYCERIN (NITROSTAT) 0.4 MG SL tablet    Sig: Place 1 tablet (0.4 mg total) under the tongue every 5 (five) minutes as needed for chest pain.    Dispense:  25 tablet    Refill:  11    Order Specific Question:   Supervising Provider    Answer:   Deboraha Sprang [[id number]]    Signed, Richardson Dopp, PA-C  07/22/2018 11:39 AM    [place], [place]  [address] Phone: [phone]; Fax: [phone]

## 2018-07-22 NOTE — Patient Instructions (Signed)
Medication Instructions:  Your physician recommends that you continue on your current medications as directed. Please refer to the Current Medication list given to you today.  If you need a refill on your cardiac medications before your next appointment, please call your pharmacy.   Lab work: NONE If you have labs (blood work) drawn today and your tests are completely normal, you will receive your results only by: . MyChart Message (if you have MyChart) OR . A paper copy in the mail If you have any lab test that is abnormal or we need to change your treatment, we will call you to review the results.  Testing/Procedures: NONE  Follow-Up: At [facility], you and your health needs are our priority.  As part of our continuing mission to provide you with exceptional heart care, we have created designated Provider Care Teams.  These Care Teams include your primary Cardiologist (physician) and Advanced Practice Providers (APPs -  Physician Assistants and Nurse Practitioners) who all work together to provide you with the care you need, when you need it. You will need a follow up appointment in:  6 months.  Please call our office 2 months in advance to schedule this appointment.  You may see Michael Cooper, MD or one of the following Advanced Practice Providers on your designated Care Team: Scott Weaver, PA-C Vin Bhagat, PA-C . Janine Hammond, NP  Any Other Special Instructions Will Be Listed Below (If Applicable).    

## 2018-08-07 ENCOUNTER — Telehealth: Payer: Self-pay | Admitting: Family Medicine

## 2018-08-07 NOTE — Telephone Encounter (Signed)
Future lab orders are already in.  Algis Greenhouse. [name], MD 08/07/2018 5:40 PM

## 2018-08-07 NOTE — Telephone Encounter (Signed)
Please advise as there are not any lab orders in and insurance may not cover labs prior to visit.  Copied from [place] [phone]. Topic: Appointment Scheduling - Scheduling Inquiry for Clinic >> Aug 07, 2018  1:45 PM Reyne Dumas L wrote: Reason for CRM:   Pt states he spoke with Dr. [name] about having CPE labs done prior to CPE.  Pt wants to know when he can come in to have those done. Pt can be reached at [phone]

## 2018-08-07 NOTE — Telephone Encounter (Signed)
Please advise 

## 2018-08-10 NOTE — Telephone Encounter (Signed)
Could you please schedule this patient for a lab visit prior to his CPE on 09/09/2018 (at least 2 days prior)?  He does need to be fasting.  Thanks!

## 2018-08-18 DIAGNOSIS — L430 Hypertrophic lichen planus: Secondary | ICD-10-CM | POA: Diagnosis not present

## 2018-08-18 DIAGNOSIS — Z85828 Personal history of other malignant neoplasm of skin: Secondary | ICD-10-CM | POA: Diagnosis not present

## 2018-08-18 DIAGNOSIS — D485 Neoplasm of uncertain behavior of skin: Secondary | ICD-10-CM | POA: Diagnosis not present

## 2018-08-18 DIAGNOSIS — L820 Inflamed seborrheic keratosis: Secondary | ICD-10-CM | POA: Diagnosis not present

## 2018-08-18 DIAGNOSIS — L570 Actinic keratosis: Secondary | ICD-10-CM | POA: Diagnosis not present

## 2018-08-18 DIAGNOSIS — L821 Other seborrheic keratosis: Secondary | ICD-10-CM | POA: Diagnosis not present

## 2018-08-25 DIAGNOSIS — M5416 Radiculopathy, lumbar region: Secondary | ICD-10-CM | POA: Diagnosis not present

## 2018-09-07 ENCOUNTER — Other Ambulatory Visit (INDEPENDENT_AMBULATORY_CARE_PROVIDER_SITE_OTHER): Payer: Medicare Other

## 2018-09-07 DIAGNOSIS — Z125 Encounter for screening for malignant neoplasm of prostate: Secondary | ICD-10-CM

## 2018-09-07 DIAGNOSIS — E7800 Pure hypercholesterolemia, unspecified: Secondary | ICD-10-CM

## 2018-09-07 DIAGNOSIS — I2510 Atherosclerotic heart disease of native coronary artery without angina pectoris: Secondary | ICD-10-CM

## 2018-09-07 DIAGNOSIS — E039 Hypothyroidism, unspecified: Secondary | ICD-10-CM

## 2018-09-07 LAB — LIPID PANEL
CHOL/HDL RATIO: 3
Cholesterol: 138 mg/dL (ref 0–200)
HDL: 46.3 mg/dL (ref >39.00)
LDL Cholesterol: 78 mg/dL (ref 0–99)
NONHDL: 91.62
Triglycerides: 68 mg/dL (ref 0.0–149.0)
VLDL: 13.6 mg/dL (ref 0.0–40.0)

## 2018-09-07 LAB — CBC
HCT: 44.3 % (ref 39.0–52.0)
Hemoglobin: 14.9 g/dL (ref 13.0–17.0)
MCHC: 33.6 g/dL (ref 30.0–36.0)
MCV: 95.5 fl (ref 78.0–100.0)
Platelets: 267 10*3/uL (ref 150.0–400.0)
RBC: 4.64 Mil/uL (ref 4.22–5.81)
RDW: 13 % (ref 11.5–15.5)
WBC: 10 10*3/uL (ref 4.0–10.5)

## 2018-09-07 LAB — COMPREHENSIVE METABOLIC PANEL
ALT: 34 U/L (ref 0–53)
AST: 15 U/L (ref 0–37)
Albumin: 4 g/dL (ref 3.5–5.2)
Alkaline Phosphatase: 59 U/L (ref 39–117)
BUN: 22 mg/dL (ref 6–23)
CALCIUM: 9.7 mg/dL (ref 8.4–10.5)
CO2: 31 meq/L (ref 19–32)
Chloride: 101 mEq/L (ref 96–112)
Creatinine, Ser: 1.04 mg/dL (ref 0.40–1.50)
GFR: 69.83 mL/min (ref >60.00)
Glucose, Bld: 96 mg/dL (ref 70–99)
Potassium: 4.4 mEq/L (ref 3.5–5.1)
Sodium: 140 mEq/L (ref 135–145)
Total Bilirubin: 0.8 mg/dL (ref 0.2–1.2)
Total Protein: 6.2 g/dL (ref 6.0–8.3)

## 2018-09-07 LAB — TSH: TSH: 6.99 u[IU]/mL — ABNORMAL HIGH (ref 0.35–4.50)

## 2018-09-07 LAB — PSA, MEDICARE: PSA: 3.02 ng/ml (ref 0.10–4.00)

## 2018-09-09 ENCOUNTER — Encounter: Payer: Medicare Other | Admitting: Family Medicine

## 2018-09-10 ENCOUNTER — Other Ambulatory Visit: Payer: Self-pay

## 2018-09-10 ENCOUNTER — Telehealth: Payer: Self-pay | Admitting: Family Medicine

## 2018-09-10 NOTE — Progress Notes (Signed)
Please inform patient of the following:  Thyroid test was off just a bit - may be a lab error. Recommend continue current dose and we can recheck at his visit next week.  We discuss more in detail at his follow up appointment.  Ryan [name]. [name], MD 09/10/2018 5:12 PM

## 2018-09-10 NOTE — Telephone Encounter (Signed)
Please advise.  Unsure if dose will change due to recent lab result.

## 2018-09-10 NOTE — Telephone Encounter (Signed)
MEDICATION: SYNTHROID 88 MCG tablet [[id number]]    PHARMACY:  [facility], [place] A 90 DAY SUPPLY : yes  IS PATIENT OUT OF MEDICATION: no  IF NOT; HOW MUCH IS LEFT: about five more pills (Patient says it takes about two weeks for him to receive the medicine after its ordered.)  LAST APPOINTMENT DATE: @1 /10/2018  NEXT APPOINTMENT DATE:@2 /05/2019  OTHER COMMENTS:    **Let patient know to contact pharmacy at the end of the day to make sure medication is ready. **  ** Please notify patient to allow 48-72 hours to process**  **Encourage patient to contact the pharmacy for refills or they can request refills through [facility]**

## 2018-09-10 NOTE — Telephone Encounter (Signed)
We were supposed to discuss at his CPE that he showed up late for. Would send in 30 supply of current dose and we should recheck TSH in the next week or so.  Algis Greenhouse. [name], MD 09/10/2018 3:47 PM

## 2018-09-11 ENCOUNTER — Encounter: Payer: Medicare Other | Admitting: Family Medicine

## 2018-09-11 ENCOUNTER — Other Ambulatory Visit: Payer: Self-pay

## 2018-09-11 MED ORDER — LEVOTHYROXINE SODIUM 88 MCG PO TABS: 88.0000 ug | ORAL_TABLET | Freq: Every day | ORAL | 3 refills | Status: DC

## 2018-09-11 NOTE — Telephone Encounter (Signed)
Patient notified

## 2018-09-14 ENCOUNTER — Ambulatory Visit (INDEPENDENT_AMBULATORY_CARE_PROVIDER_SITE_OTHER): Payer: Medicare Other | Admitting: Family Medicine

## 2018-09-14 ENCOUNTER — Encounter: Payer: Self-pay | Admitting: Family Medicine

## 2018-09-14 VITALS — BP 118/64 | HR 58 | Temp 98.4°F | Ht 71.25 in | Wt 218.0 lb

## 2018-09-14 DIAGNOSIS — I2510 Atherosclerotic heart disease of native coronary artery without angina pectoris: Secondary | ICD-10-CM | POA: Diagnosis not present

## 2018-09-14 DIAGNOSIS — K219 Gastro-esophageal reflux disease without esophagitis: Secondary | ICD-10-CM

## 2018-09-14 DIAGNOSIS — J4520 Mild intermittent asthma, uncomplicated: Secondary | ICD-10-CM | POA: Diagnosis not present

## 2018-09-14 DIAGNOSIS — N401 Enlarged prostate with lower urinary tract symptoms: Secondary | ICD-10-CM | POA: Diagnosis not present

## 2018-09-14 DIAGNOSIS — Z6830 Body mass index (BMI) 30.0-30.9, adult: Secondary | ICD-10-CM

## 2018-09-14 DIAGNOSIS — E039 Hypothyroidism, unspecified: Secondary | ICD-10-CM

## 2018-09-14 DIAGNOSIS — E7800 Pure hypercholesterolemia, unspecified: Secondary | ICD-10-CM

## 2018-09-14 DIAGNOSIS — R351 Nocturia: Secondary | ICD-10-CM

## 2018-09-14 DIAGNOSIS — J45909 Unspecified asthma, uncomplicated: Secondary | ICD-10-CM | POA: Insufficient documentation

## 2018-09-14 NOTE — Patient Instructions (Signed)
It was very nice to see you today!  No changes today.  Please come back in a few weeks to recheck your thyroid.  Please let me know if your prostate issues worsen and we can get you into see a urologist.  Come back to see me in 1 year for your next visit, or sooner as needed.  Take care, Dr [name]

## 2018-09-14 NOTE — Assessment & Plan Note (Signed)
Continue Flomax 0.4 mg daily.  Discussed referral to urology however patient wishes to defer for the time being.

## 2018-09-14 NOTE — Assessment & Plan Note (Signed)
Last TSH mildly elevated.  Discussed need to take medication an hour before eating on an empty stomach.  States that he will start taking at night.  He will follow-up in a few weeks to have TSH rechecked.  Continue Synthroid 88 mcg daily for the time being.

## 2018-09-14 NOTE — Assessment & Plan Note (Signed)
Stable. Continue albuterol as needed.  

## 2018-09-14 NOTE — Progress Notes (Signed)
   Chief Complaint:  Ryan [name] is a 73 y.o. male who presents today with a chief complaint of hypothyroidism.   Assessment/Plan:  BMI 30 Discussed lifestyle modifications.  Hypothyroidism Last TSH mildly elevated.  Discussed need to take medication an hour before eating on an empty stomach.  States that he will start taking at night.  He will follow-up in a few weeks to have TSH rechecked.  Continue Synthroid 88 mcg daily for the time being.  Hypercholesterolemia Last LDL 78.  Continue Lipitor 80mg  daily.   CAD (coronary artery disease) Stable.  Continue management per cardiology.  BPH associated with nocturia Continue Flomax 0.4 mg daily.  Discussed referral to urology however patient wishes to defer for the time being.   GERD (gastroesophageal reflux disease) Stable.  Continue Protonix 40 mg daily.  Asthma Stable.  Continue albuterol as needed.  Follow-up in 1 year.    Subjective:  HPI:  His stable, chronic medical conditions are outlined below:  # BPH - On Flomax 0.8mg  daily and tolerating well - ROS: Mild nocturia  ccasional dribbling.    # GERD - On Protonix 20 mg daily tolerating well.  # Hypothyroidism - On Synthroid 88 mcg daily tolerating well.  # Asthma - Uses albuterol as needed.    % Chronic low back pain - Follows with orthopedics.    % CAD s/p MI and stenting 03/2018 - Follows with cardiology.   - On Lipitor 80 mg daily and ASA 81 mg daily.  Tolerating both well. - ROS: No reported chest pain or shortness of breath  BMI 30 Does not exercise regularly due to back issues.   ROS: Per HPI  PMH: He reports that he has never smoked. He has never used smokeless tobacco. He reports current alcohol use. He reports that he does not use drugs.      Objective:  Physical Exam: BP 118/64 (BP Location: Left Arm, Patient Position: Sitting, Cuff Size: Large)   Pulse (!) 58   Temp 98.4 F (36.9 C) (Oral)   Ht 5' 11.25" (1.81 m)   Wt 218 lb  (98.9 kg)   SpO2 94%   BMI 30.19 kg/m   Wt Readings from Last 3 Encounters:  09/14/18 218 lb (98.9 kg)  07/22/18 219 lb 1.9 oz (99.4 kg)  07/13/18 218 lb 12 oz (99.2 kg)    Gen: NAD, resting comfortably CV: Regular rate and rhythm with no murmurs appreciated Pulm: Normal work of breathing, clear to auscultation bilaterally with no crackles, wheezes, or rhonchi GI: Normal bowel sounds present. Soft, Nontender, Nondistended. MSK: No edema, cyanosis, or clubbing noted Skin: Warm, dry Neuro: Grossly normal, moves all extremities Psych: Normal affect and thought content     Amario Longmore M. [name], MD 09/14/2018 10:43 AM

## 2018-09-14 NOTE — Assessment & Plan Note (Addendum)
Last LDL 78.  Continue Lipitor 80mg  daily.

## 2018-09-14 NOTE — Assessment & Plan Note (Signed)
Stable.  Continue management per cardiology. 

## 2018-09-14 NOTE — Assessment & Plan Note (Signed)
Stable.  Continue Protonix 40 mg daily

## 2018-09-16 ENCOUNTER — Other Ambulatory Visit: Payer: Self-pay

## 2018-09-16 ENCOUNTER — Ambulatory Visit: Payer: Self-pay | Admitting: *Deleted

## 2018-09-16 DIAGNOSIS — E038 Other specified hypothyroidism: Secondary | ICD-10-CM

## 2018-09-16 DIAGNOSIS — E039 Hypothyroidism, unspecified: Secondary | ICD-10-CM

## 2018-09-16 DIAGNOSIS — Z23 Encounter for immunization: Secondary | ICD-10-CM

## 2018-09-16 DIAGNOSIS — J302 Other seasonal allergic rhinitis: Secondary | ICD-10-CM

## 2018-09-16 DIAGNOSIS — E032 Hypothyroidism due to medicaments and other exogenous substances: Secondary | ICD-10-CM

## 2018-09-16 DIAGNOSIS — L820 Inflamed seborrheic keratosis: Secondary | ICD-10-CM

## 2018-09-16 MED ORDER — SYNTHROID 88 MCG PO TABS: 88.0000 ug | ORAL_TABLET | Freq: Every day | ORAL | 0 refills | Status: DC

## 2018-09-16 NOTE — Telephone Encounter (Signed)
Rx sent to the pharmacy name brand Synthroid.

## 2018-09-16 NOTE — Telephone Encounter (Addendum)
Contacted pt; he states that when he gor his thyroid medication was sent in as generic; he is concerned because his previous PCP stopped generic synthroid because it caused his numbers to be off; for this reason he would like to get brand name synthroid; he verifies that uses [facility] mail order; he also scheduled a lab appointment on 10/06/2018 at 1015;  the pt also states that he can be contacted [phone]; pt last seen by Dr Dimas Chyle, [place], 09/14/2018; will route to office for final disposition.    Reason for Disposition . Caller has URGENT medication question about med that PCP prescribed and triager unable to answer question  Answer Assessment - Initial Assessment Questions 1. SYMPTOMS: "Do you have any symptoms?"     no 2. SEVERITY: If symptoms are present, ask "Are they mild, moderate or severe?"     n/a  Protocols used: MEDICATION QUESTION CALL-A-AH

## 2018-10-06 ENCOUNTER — Other Ambulatory Visit (INDEPENDENT_AMBULATORY_CARE_PROVIDER_SITE_OTHER): Payer: Medicare Other

## 2018-10-06 DIAGNOSIS — Z6829 Body mass index (BMI) 29.0-29.9, adult: Secondary | ICD-10-CM | POA: Diagnosis not present

## 2018-10-06 DIAGNOSIS — R030 Elevated blood-pressure reading, without diagnosis of hypertension: Secondary | ICD-10-CM | POA: Diagnosis not present

## 2018-10-06 DIAGNOSIS — M5416 Radiculopathy, lumbar region: Secondary | ICD-10-CM | POA: Diagnosis not present

## 2018-10-06 DIAGNOSIS — E039 Hypothyroidism, unspecified: Secondary | ICD-10-CM | POA: Diagnosis not present

## 2018-10-06 DIAGNOSIS — M545 Low back pain: Secondary | ICD-10-CM | POA: Diagnosis not present

## 2018-10-06 LAB — TSH: TSH: 3.41 u[IU]/mL (ref 0.35–4.50)

## 2018-10-07 NOTE — Progress Notes (Signed)
Dr Marigene Ehlers interpretation of your lab work:  Good news! Your thyroid level is perfect. Please continue taking your synthroid as you have been. We can recheck again in a year.    If you have any additional questions, please give [place] a call or send [place] a message through [place].  Take care, Dr [name]

## 2018-10-17 ENCOUNTER — Other Ambulatory Visit: Payer: Self-pay

## 2018-10-17 ENCOUNTER — Ambulatory Visit (INDEPENDENT_AMBULATORY_CARE_PROVIDER_SITE_OTHER): Payer: Medicare Other | Admitting: Family Medicine

## 2018-10-17 VITALS — BP 112/64 | HR 73 | Temp 97.7°F | Resp 16 | Wt 219.0 lb

## 2018-10-17 DIAGNOSIS — R059 Cough, unspecified: Secondary | ICD-10-CM

## 2018-10-17 DIAGNOSIS — R05 Cough: Secondary | ICD-10-CM

## 2018-10-17 DIAGNOSIS — R0982 Postnasal drip: Secondary | ICD-10-CM

## 2018-10-17 MED ORDER — HYDROCODONE-HOMATROPINE 5-1.5 MG/5ML PO SYRP: 5.0000 mL | ORAL_SOLUTION | Freq: Three times a day (TID) | ORAL | 0 refills | Status: DC | PRN

## 2018-10-17 NOTE — Progress Notes (Signed)
Subjective:    Patient ID: Ryan [name], male    DOB: 04-13-1946, 73 y.o.   MRN: [id number]  HPI   Patient presents to clinic complaining of cough and postnasal drainage been present off and on for the past 4 to 5 weeks.  Patient has been using over-the-counter allergy medication and nasal spray with okay effect and keeping symptoms under control.  Main concern is the cough and drainage down back of throat because that keeps him awake at night.  In the past he has had great success with use of a Hydromet cough syrup, this suppresses cough and allows him to rest.  Denies fever or chills.  Denies body aches.  Denies chest pain, shortness of breath or wheezing.  Patient Active Problem List   Diagnosis Date Noted   GERD (gastroesophageal reflux disease) 09/14/2018   Asthma 09/14/2018   Spinal stenosis of lumbar region with neurogenic claudication 08/13/2017   CAD (coronary artery disease) 12/17/2016   Hypercholesterolemia 12/10/2016   History of skin cancer 08/28/2016   BPH associated with nocturia 05/17/2014   Hypothyroidism 03/11/2007     Review of Systems  Constitutional: Negative for chills, fatigue and fever.  HENT: +nasal congestion. No ear pain, sinus pain and sore throat.   Eyes: Negative.   Respiratory: +cough. Negative for shortness of breath and wheezing.   Cardiovascular: Negative for chest pain, palpitations and leg swelling.  Gastrointestinal: Negative for abdominal pain, diarrhea, nausea and vomiting.  Genitourinary: Negative for dysuria, frequency and urgency.  Musculoskeletal: Negative for arthralgias and myalgias.  Skin: Negative for color change, pallor and rash.  Neurological: Negative for syncope, light-headedness and headaches.  Psychiatric/Behavioral: The patient is not nervous/anxious.        Objective:   Physical Exam Vitals signs and nursing note reviewed.  Constitutional:      General: He is not in acute distress.    Appearance: He is  not ill-appearing or toxic-appearing.  HENT:     Head: Normocephalic and atraumatic.     Ears:     Comments: Mild fullness bilat TMs    Nose: Congestion and rhinorrhea present.     Comments: +Clear drainage, +post nasal drip    Mouth/Throat:     Mouth: Mucous membranes are moist.  Eyes:     General: No scleral icterus.    Extraocular Movements: Extraocular movements intact.     Conjunctiva/sclera: Conjunctivae normal.     Pupils: Pupils are equal, round, and reactive to light.  Neck:     Musculoskeletal: Neck supple. No neck rigidity.  Cardiovascular:     Rate and Rhythm: Normal rate and regular rhythm.     Heart sounds: Normal heart sounds.  Pulmonary:     Effort: Pulmonary effort is normal. No respiratory distress.     Breath sounds: Normal breath sounds. No wheezing, rhonchi or rales.  Lymphadenopathy:     Cervical: No cervical adenopathy.  Skin:    General: Skin is warm and dry.     Coloration: Skin is not jaundiced or pale.  Neurological:     Mental Status: He is alert and oriented to person, place, and time.  Psychiatric:        Mood and Affect: Mood normal.        Behavior: Behavior normal.    Vitals:   10/17/18 0916  BP: 112/64  Pulse: 73  Resp: 16  Temp: 97.7 F (36.5 C)  SpO2: 98%      Assessment & Plan:  Cough/postnasal drip-suspect patient's cough is related to postnasal drainage.  Lungs are clear on exam.  He has no fever or any signs of concerning infection.  Patient advised to continue using nasal spray to reduce congestion, use over-the-counter antihistamine like Claritin or Allegra to dry up congestion, and refill of cough syrup given.  Patient aware that cough syrup with narcotic component can cause drowsiness so do not take prior to driving.  Advised to rest, keep up fluid intake and do good handwashing.  Patient will follow-up as planned with PCP.  Advised to return to clinic sooner if any issues arise or any of his current symptoms worsen.

## 2018-10-17 NOTE — Patient Instructions (Signed)
Postnasal Drip  Postnasal drip is the feeling of mucus going down the back of your throat. Mucus is a slimy substance that moistens and cleans your nose and throat, as well as the air pockets in face bones near your forehead and cheeks (sinuses). Small amounts of mucus pass from your nose and sinuses down the back of your throat all the time. This is normal. When you produce too much mucus or the mucus gets too thick, you can feel it.  Some common causes of postnasal drip include:   Having more mucus because of:  ? A cold or the flu.  ? Allergies.  ? Cold air.  ? Certain medicines.   Having more mucus that is thicker because of:  ? A sinus or nasal infection.  ? Dry air.  ? A food allergy.  Follow these instructions at home:  Relieving discomfort     Gargle with a salt-water mixture 3-4 times a day or as needed. To make a salt-water mixture, completely dissolve -1 tsp of salt in 1 cup of warm water.   If the air in your home is dry, use a humidifier to add moisture to the air.   Use a saline spray or container (neti pot) to flush out the nose (nasal irrigation). These methods can help clear away mucus and keep the nasal passages moist.  General instructions   Take over-the-counter and prescription medicines only as told by your health care provider.   Follow instructions from your health care provider about eating or drinking restrictions. You may need to avoid caffeine.   Avoid things that you know you are allergic to (allergens), like dust, mold, pollen, pets, or certain foods.   Drink enough fluid to keep your urine pale yellow.   Keep all follow-up visits as told by your health care provider. This is important.  Contact a health care provider if:   You have a fever.   You have a sore throat.   You have difficulty swallowing.   You have headache.   You have sinus pain.   You have a cough that does not go away.   The mucus from your nose becomes thick and is green or yellow in color.   You have  cold or flu symptoms that last more than 10 days.  Summary   Postnasal drip is the feeling of mucus going down the back of your throat.   If your health care provider approves, use nasal irrigation or a nasal spray 2?4 times a day.   Avoid things that you know you are allergic to (allergens), like dust, mold, pollen, pets, or certain foods.  This information is not intended to replace advice given to you by your health care provider. Make sure you discuss any questions you have with your health care provider.  Document Released: 11/04/2016 Document Revised: 11/04/2016 Document Reviewed: 11/04/2016  Elsevier Interactive Patient Education  2019 [employer]

## 2018-11-17 ENCOUNTER — Other Ambulatory Visit: Payer: Self-pay | Admitting: Cardiovascular Disease

## 2018-11-17 MED ORDER — ATORVASTATIN CALCIUM 80 MG PO TABS: ORAL_TABLET | ORAL | 2 refills | Status: DC

## 2018-11-17 NOTE — Telephone Encounter (Signed)
Pt's medication was sent to pt's pharmacy as requested. Confirmation received.  °

## 2018-11-24 ENCOUNTER — Telehealth: Payer: Self-pay | Admitting: Cardiovascular Disease

## 2018-11-24 NOTE — Telephone Encounter (Signed)
New Message   Pt is calling to get a virtual visit scheduled for June with Dr Burt Knack for his follow up and their is nothing available at this time, pt would not like to see a PA because he saw a PA his last visit    Please call

## 2018-11-26 DIAGNOSIS — M5416 Radiculopathy, lumbar region: Secondary | ICD-10-CM | POA: Diagnosis not present

## 2018-12-07 ENCOUNTER — Other Ambulatory Visit: Payer: Self-pay | Admitting: Family Medicine

## 2018-12-16 NOTE — Telephone Encounter (Signed)
Offered the patient an e-visit with Dr. Burt Knack 6/17. He declined the visit as [place] be [place] the beach. Informed him he will be called if further appointments open.

## 2018-12-29 DIAGNOSIS — M5136 Other intervertebral disc degeneration, lumbar region: Secondary | ICD-10-CM | POA: Diagnosis not present

## 2018-12-29 DIAGNOSIS — M5416 Radiculopathy, lumbar region: Secondary | ICD-10-CM | POA: Diagnosis not present

## 2018-12-29 NOTE — Telephone Encounter (Signed)
Scheduled patient for e-visit with Dr. Burt Knack 5/29 at 0900. He understands to have VS and meds ready for pre-call. Consent obtained for video visit.     Virtual Visit Pre-Appointment Phone Call  Confirm consent - "In the setting of the current Covid19 crisis, you are scheduled for a (phone or video) visit with your provider on (date) at (time).  Just as we do with many in-office visits, in order for you to participate in this visit, we must obtain consent.  If you'd like, I can send this to your mychart (if signed up) or email for you to review.  Otherwise, I can obtain your verbal consent now.  All virtual visits are billed to your insurance company just like a normal visit would be.  By agreeing to a virtual visit, we'd like you to understand that the technology does not allow for your provider to perform an examination, and thus may limit your provider's ability to fully assess your condition. If your provider identifies any concerns that need to be evaluated in person, we will make arrangements to do so.  Finally, though the technology is pretty good, we cannot assure that it will always work on either your or our end, and in the setting of a video visit, we may have to convert it to a phone-only visit.  In either situation, we cannot ensure that we have a secure connection.  Are you willing to proceed?" STAFF: Did the patient verbally acknowledge consent to telehealth visit? Document YES/NO here: YES   TELEPHONE CALL NOTE  Ryan [name] has been deemed a candidate for a follow-up tele-health visit to limit community exposure during the Covid-19 pandemic. I spoke with the patient via phone to ensure availability of phone/video source, confirm preferred email & phone number, and discuss instructions and expectations.  I reminded Ryan [name] to be prepared with any vital sign and/or heart rhythm information that could potentially be obtained via home monitoring, at the time of his visit. I  reminded Ryan [name] to expect a phone call prior to his visit.   IF USING DOXIMITY or [email or web address] - The patient will receive a link just prior to their visit by text.

## 2019-01-01 ENCOUNTER — Telehealth (INDEPENDENT_AMBULATORY_CARE_PROVIDER_SITE_OTHER): Payer: Medicare Other | Admitting: Cardiovascular Disease

## 2019-01-01 ENCOUNTER — Other Ambulatory Visit: Payer: Self-pay

## 2019-01-01 ENCOUNTER — Encounter: Payer: Self-pay | Admitting: Cardiovascular Disease

## 2019-01-01 VITALS — BP 145/81 | HR 55 | Ht 72.0 in | Wt 216.0 lb

## 2019-01-01 DIAGNOSIS — I2510 Atherosclerotic heart disease of native coronary artery without angina pectoris: Secondary | ICD-10-CM

## 2019-01-01 DIAGNOSIS — E782 Mixed hyperlipidemia: Secondary | ICD-10-CM

## 2019-01-01 DIAGNOSIS — R001 Bradycardia, unspecified: Secondary | ICD-10-CM | POA: Diagnosis not present

## 2019-01-01 NOTE — Progress Notes (Signed)
Virtual Visit via Telephone Note   This visit type was conducted due to national recommendations for restrictions regarding the COVID-19 Pandemic (e.g. social distancing) in an effort to limit this patient's exposure and mitigate transmission in our community.  Due to his co-morbid illnesses, this patient is at least at moderate risk for complications without adequate follow up.  This format is felt to be most appropriate for this patient at this time.  The patient did not have access to video technology/had technical difficulties with video requiring transitioning to audio format only (telephone).  All issues noted in this document were discussed and addressed.  No physical exam could be performed with this format.  Please refer to the patient's chart for his  consent to telehealth for [facility].   Date:  01/01/2019   ID:  Ryan [name], DOB 09-27-1945, MRN [id number]  Patient Location: Home Provider Location: Home  PCP:  Vivi Barrack, MD  Cardiologist:  Sherren Mocha, MD  Electrophysiologist:  None   Evaluation Performed:  Follow-Up Visit  Chief Complaint:  Follow-up CAD  History of Present Illness:    Ryan [name] is a 73 y.o. male with history of coronary artery disease, presenting for follow-up evaluation via telephone today in light of the current COVID-19 pandemic.  The patient initially presented in 2018 with an ST elevation MI secondary to left circumflex acute occlusion, treated with drug-eluting stent placement.  He was last seen by Richardson Dopp in December 2019.  He's been spending a lot of time at [facility] during the Covid-19 pandemic. [place] beach, but plan to keep their home in [place]. Today, he denies symptoms of palpitations, chest pain, shortness of breath, orthopnea, PND, lower extremity edema, dizziness, or syncope.  The patient does not have symptoms concerning for COVID-19 infection (fever, chills,  cough, or new shortness of breath).    Past Medical History:  Diagnosis Date  . Allergy   . Anxiety   . Arthritis   . Asthma   . Cataract   . Coronary artery disease 12/08/2016   STEMI, DES Circumflex  . Hyperlipidemia   . Hypothyroidism   . STEMI (ST elevation myocardial infarction) ([place]) 12/08/2016   Past Surgical History:  Procedure Laterality Date  . cataract Bilateral 03/2016  . COLONOSCOPY  10.27.2005  . epidural steroid shot     x 3 , lumbar spine area  . LEFT HEART CATH AND CORONARY ANGIOGRAPHY N/A 12/08/2016   Procedure: Left Heart Cath and Coronary Angiography;  Surgeon: Sherren Mocha, MD;  Location: [facility];  Service: Cardiovascular;  Laterality: N/A;  . SHOULDER SURGERY Right    Rotator Cuff Tear Repair  . TONSILLECTOMY       Current Meds  Medication Sig  . acetaminophen (TYLENOL) 325 MG tablet Take 325-650 mg by mouth every 6 (six) hours as needed for mild pain or moderate pain.  Marland Kitchen albuterol (PROVENTIL HFA;VENTOLIN HFA) 108 (90 Base) MCG/ACT inhaler Inhale 2 puffs into the lungs every 6 (six) hours as needed for wheezing.  Marland Kitchen aspirin EC 81 MG EC tablet Take 1 tablet (81 mg total) by mouth daily.  Marland Kitchen atorvastatin (LIPITOR) 80 MG tablet TAKE 1 TABLET (80 MG TOTAL) DAILY AT 6 PM  . bisacodyl (DULCOLAX) 5 MG EC tablet Take 5 mg by mouth daily as needed for moderate constipation.  Marland Kitchen co-enzyme Q-10 30 MG capsule Take 30 mg by mouth daily.   . fluorouracil (  EFUDEX) 5 % cream Apply 1 application topically as needed.   Marland Kitchen HYDROcodone-homatropine (HYCODAN) 5-1.5 MG/5ML syrup Take 5 mLs by mouth every 8 (eight) hours as needed for cough.  . Melatonin 5 MG TABS Take 5 mg by mouth at bedtime as needed (sleep).  . Multiple Vitamins-Minerals (CENTRUM ADULTS PO) Take 1 tablet by mouth daily.  . nitroGLYCERIN (NITROSTAT) 0.4 MG SL tablet Place 1 tablet (0.4 mg total) under the tongue every 5 (five) minutes as needed for chest pain.  . pantoprazole (PROTONIX) 20 MG tablet  Take 20 mg by mouth daily.  . polyethylene glycol powder (GLYCOLAX/MIRALAX) powder Take 17 g by mouth as needed for mild constipation.   Marland Kitchen SYNTHROID 88 MCG tablet Take 1 tablet (88 mcg total) by mouth daily.  . tamsulosin (FLOMAX) 0.4 MG CAPS capsule TAKE 2 CAPSULES(0.8 MG) BY MOUTH DAILY     Allergies:   Contrast media [iodinated diagnostic agents]; Erythromycin; Niacin and related; and Penicillins   Social History   Tobacco Use  . Smoking status: Never Smoker  . Smokeless tobacco: Never Used  Substance Use Topics  . Alcohol use: Yes    Comment: occasional  . Drug use: No     Family Hx: The patient's family history includes Diabetes in his mother; Diverticulosis in his mother and sister; Heart disease in his sister; Heart failure in his mother; Irritable bowel syndrome in his mother and sister; Lung cancer in his father. There is no history of Colon cancer, Esophageal cancer, Pancreatic cancer, or Stomach cancer.  ROS:   Please see the history of present illness.    All other systems reviewed and are negative.  Prior CV studies:   The following studies were reviewed today:  Cardiac catheterization 12/08/2016: Conclusion   1. Severe thrombotic stenosis of the left circumflex treated successfully with Primary PCI using a 3.5x24 mm Promus DES 2. Moderate diffuse LAD, RCA, and ramus intermedius stenoses 3. Normal LV function  Recommend:  Aggressive medical therapy  The patient's residual CAD affects small caliber vessels and will be best managed medically  Aggrastat x 6 hours  DAPT with ASA and brilinta x 12 months without interruption  Anticipate discharge 48 hours if no complications   Coronary Findings   Diagnostic  Dominance: Right  Left Main  Vessel is angiographically normal.  Left Anterior Descending  Vessel is small.  Prox LAD to Mid LAD lesion 50% stenosed  Prox LAD to Mid LAD lesion.  Ramus Intermedius  Vessel is small.  Ramus lesion 80% stenosed   Ramus lesion.  Left Circumflex  Prox Cx lesion 95% stenosed  The lesion is eccentric.  Right Coronary Artery  Mid RCA lesion 50% stenosed  Mid RCA lesion.  Dist RCA lesion 40% stenosed  Dist RCA lesion.  Intervention   Prox Cx lesion  Angioplasty  Lesion crossed with guidewire using a WIRE COUGAR XT ST. 190CM. Pre-stent angioplasty was performed using a BALLOON MOZEC 2.50X14. A STENT PROMUS PREM MR 3.5X24 drug eluting stent was successfully placed. Post-stent angioplasty was not performed. The pre-interventional distal flow is normal (TIMI 3). The post-interventional distal flow is normal (TIMI 3). The intervention was successful . No complications occurred at this lesion. An EBU guide is used. Heparin and aggrastat are used for anticoagulation. A therapeutic ACT is achieved. The lesion is dilated with a 2.5 mm balloon, then stented with a 3.5x24 mm Promus DES.  There is a 0% residual stenosis post intervention.  Wall Motion  Left Heart   Left Ventricle The left ventricular size is normal. The left ventricular systolic function is normal. LV end diastolic pressure is normal. The left ventricular ejection fraction is 55-65% by visual estimate. No regional wall motion abnormalities.  Coronary Diagrams   Diagnostic  Dominance: Right    Intervention     Labs/Other Tests and Data Reviewed:    EKG:  An ECG dated 07/22/2018 was personally reviewed today and demonstrated:  NSr 62 bpm, within norma llimits  Recent Labs: 09/07/2018: ALT 34; BUN 22; Creatinine, Ser 1.04; Hemoglobin 14.9; Platelets 267.0; Potassium 4.4; Sodium 140 10/06/2018: TSH 3.41   Recent Lipid Panel Lab Results  Component Value Date/Time   CHOL 138 09/07/2018 08:32 AM   CHOL 89 (L) 01/21/2017 09:04 AM   TRIG 68.0 09/07/2018 08:32 AM   HDL 46.30 09/07/2018 08:32 AM   HDL 31 (L) 01/21/2017 09:04 AM   CHOLHDL 3 09/07/2018 08:32 AM   LDLCALC 78 09/07/2018 08:32 AM   LDLCALC 48 01/21/2017 09:04 AM    LDLDIRECT 144.0 05/13/2013 10:36 AM    Wt Readings from Last 3 Encounters:  12/31/18 216 lb (98 kg)  10/17/18 219 lb (99.3 kg)  09/14/18 218 lb (98.9 kg)     Objective:    Vital Signs:  BP (!) 145/81 (BP Location: Left Arm, Patient Position: Sitting, Cuff Size: Normal)   Pulse (!) 55   Ht 6' (1.829 m)   Wt 216 lb (98 kg)   BMI 29.29 kg/m    VITAL SIGNS:  reviewed Alert, oriented, in NAD. Remaining exam not performed secondary to telephone visit type.  ASSESSMENT & PLAN:    1. CAD, native vessel, without angina: He continues on aspirin and a statin drug.  No beta-blocker secondary to resting bradycardia. He's walking regularly for exercise with no exertional symptoms. 2. Mixed hyperlipidemia: lipids/LFT's from March 2020 reviewed. He will continue current Rx's.  3. Bradycardia: asymptomatic. Avoid beta blockers/AV nodal agents.   COVID-19 Education: The signs and symptoms of COVID-19 were discussed with the patient and how to seek care for testing (follow up with PCP or arrange E-visit).  The importance of social distancing was discussed today.  Time:   Today, I have spent 17 minutes with the patient with telehealth technology discussing the above problems.     Medication Adjustments/Labs and Tests Ordered: Current medicines are reviewed at length with the patient today.  Concerns regarding medicines are outlined above.   Tests Ordered: No orders of the defined types were placed in this encounter.   Medication Changes: No orders of the defined types were placed in this encounter.   Disposition:  Follow up in 1 year(s)  Signed, Sherren Mocha, MD  01/01/2019 9:06 AM    [place]

## 2019-02-06 ENCOUNTER — Encounter

## 2019-03-05 DIAGNOSIS — M47816 Spondylosis without myelopathy or radiculopathy, lumbar region: Secondary | ICD-10-CM | POA: Diagnosis not present

## 2019-03-05 DIAGNOSIS — Z6829 Body mass index (BMI) 29.0-29.9, adult: Secondary | ICD-10-CM | POA: Diagnosis not present

## 2019-03-05 DIAGNOSIS — R030 Elevated blood-pressure reading, without diagnosis of hypertension: Secondary | ICD-10-CM | POA: Diagnosis not present

## 2019-03-05 DIAGNOSIS — M48061 Spinal stenosis, lumbar region without neurogenic claudication: Secondary | ICD-10-CM | POA: Diagnosis not present

## 2019-03-05 DIAGNOSIS — M5136 Other intervertebral disc degeneration, lumbar region: Secondary | ICD-10-CM | POA: Diagnosis not present

## 2019-03-15 DIAGNOSIS — M5416 Radiculopathy, lumbar region: Secondary | ICD-10-CM | POA: Diagnosis not present

## 2019-03-31 ENCOUNTER — Telehealth: Payer: Self-pay | Admitting: Cardiovascular Disease

## 2019-03-31 NOTE — Telephone Encounter (Signed)
Patient was given meloxicam for back pain/inflammation. He also got a steroid injection in his back that hasnt been effective. Patient states his blood was up to 160 on the meloxicam. They gave him celecoxib instead. Advised that neither are great for people with heart disease/ high blood pressure. But other than steroids its the only class that will help reduce inflammation. Advised to only use short term. (1 week or so). Try the celebrex and see how it works. Watch blood pressure.

## 2019-03-31 NOTE — Telephone Encounter (Signed)
New Message     Pt c/o medication issue:  1. Name of Medication: Celecoxib and Meloxicam  2. How are you currently taking this medication (dosage and times per day)?   3. Are you having a reaction (difficulty breathing--STAT)? NO  4. What is your medication issue? Patient states Dr. Blanchie Serve has ordered him to take these medications, and he wants to make sure it's ok for him to take the medication.

## 2019-04-01 NOTE — Telephone Encounter (Signed)
Agree. thanks

## 2019-04-19 DIAGNOSIS — M5136 Other intervertebral disc degeneration, lumbar region: Secondary | ICD-10-CM | POA: Diagnosis not present

## 2019-04-19 DIAGNOSIS — R030 Elevated blood-pressure reading, without diagnosis of hypertension: Secondary | ICD-10-CM | POA: Diagnosis not present

## 2019-04-19 DIAGNOSIS — Z23 Encounter for immunization: Secondary | ICD-10-CM | POA: Diagnosis not present

## 2019-04-19 DIAGNOSIS — Z6829 Body mass index (BMI) 29.0-29.9, adult: Secondary | ICD-10-CM | POA: Diagnosis not present

## 2019-04-19 DIAGNOSIS — M47816 Spondylosis without myelopathy or radiculopathy, lumbar region: Secondary | ICD-10-CM | POA: Diagnosis not present

## 2019-05-07 ENCOUNTER — Other Ambulatory Visit: Payer: Self-pay | Admitting: Family Medicine

## 2019-05-19 ENCOUNTER — Encounter: Payer: Self-pay | Admitting: Gastroenterology

## 2019-06-03 DIAGNOSIS — M47816 Spondylosis without myelopathy or radiculopathy, lumbar region: Secondary | ICD-10-CM | POA: Diagnosis not present

## 2019-06-07 DIAGNOSIS — H02833 Dermatochalasis of right eye, unspecified eyelid: Secondary | ICD-10-CM | POA: Diagnosis not present

## 2019-06-07 DIAGNOSIS — Z961 Presence of intraocular lens: Secondary | ICD-10-CM | POA: Diagnosis not present

## 2019-06-07 DIAGNOSIS — H02836 Dermatochalasis of left eye, unspecified eyelid: Secondary | ICD-10-CM | POA: Diagnosis not present

## 2019-06-14 DIAGNOSIS — M5136 Other intervertebral disc degeneration, lumbar region: Secondary | ICD-10-CM | POA: Diagnosis not present

## 2019-06-14 DIAGNOSIS — M5416 Radiculopathy, lumbar region: Secondary | ICD-10-CM | POA: Diagnosis not present

## 2019-06-14 DIAGNOSIS — R030 Elevated blood-pressure reading, without diagnosis of hypertension: Secondary | ICD-10-CM | POA: Diagnosis not present

## 2019-06-14 DIAGNOSIS — Z6829 Body mass index (BMI) 29.0-29.9, adult: Secondary | ICD-10-CM | POA: Diagnosis not present

## 2019-07-11 ENCOUNTER — Other Ambulatory Visit: Payer: Self-pay | Admitting: Cardiovascular Disease

## 2019-08-05 ENCOUNTER — Other Ambulatory Visit: Payer: Self-pay | Admitting: Family Medicine

## 2019-08-16 ENCOUNTER — Ambulatory Visit (AMBULATORY_SURGERY_CENTER): Payer: No Typology Code available for payment source

## 2019-08-16 ENCOUNTER — Other Ambulatory Visit: Payer: Self-pay

## 2019-08-16 ENCOUNTER — Telehealth: Payer: Self-pay | Admitting: Gastroenterology

## 2019-08-16 VITALS — Ht 71.0 in | Wt 219.0 lb

## 2019-08-16 DIAGNOSIS — Z86010 Personal history of colonic polyps: Secondary | ICD-10-CM

## 2019-08-16 DIAGNOSIS — Z01818 Encounter for other preprocedural examination: Secondary | ICD-10-CM

## 2019-08-16 MED ORDER — NA SULFATE-K SULFATE-MG SULF 17.5-3.13-1.6 GM/177ML PO SOLN: 1.0000 | Freq: Once | ORAL | 0 refills | Status: AC

## 2019-08-16 NOTE — Progress Notes (Signed)
No egg or soy allergy known to patient  No issues with past sedation with any surgeries  or procedures, no intubation problems  No diet pills per patient No home 02 use per patient  No blood thinners per patient  Pt has issues with constipation, 2 day prep instructions given. VIRTUAL pre-visit  No A fib or A flutter  EMMI video sent to pt's e mail  2 day prep, suprep coupon given,  Due to the COVID-19 pandemic we are asking patients to follow these guidelines. Please only bring one care partner. Please be aware that your care partner may wait in the car in the parking lot or if they feel like they will be too hot to wait in the car, they may wait in the lobby on the 4th floor. All care partners are required to wear a mask the entire time (we do not have any that we can provide them), they need to practice social distancing, and we will do a Covid check for all patient's and care partners when you arrive. Also we will check their temperature and your temperature. If the care partner waits in their car they need to stay in the parking lot the entire time and we will call them on their cell phone when the patient is ready for discharge so they can bring the car to the front of the building. Also all patient's will need to wear a mask into building.

## 2019-08-16 NOTE — Telephone Encounter (Signed)
Spoke with the patient. Suprep $140, I offered a sample. Patient will try to find a coupon from [employer] and if needed he will call us back. He declined the sample at this time.

## 2019-08-16 NOTE — Telephone Encounter (Signed)
Pt requested a call back to discuss prep soln.

## 2019-08-17 MED ORDER — SUPREP BOWEL PREP KIT 17.5-3.13-1.6 GM/177ML PO SOLN: ORAL | 0 refills | Status: DC

## 2019-08-17 NOTE — Telephone Encounter (Signed)
Pt stated that he will be out of town so his instructions will not arrive through the mail in time.  He also requested [facility] [address] instead of to [facility].

## 2019-08-17 NOTE — Telephone Encounter (Signed)
[facility].  Instructions printed out and pt will pick today

## 2019-08-17 NOTE — Addendum Note (Signed)
Addended by: Laverna Peace on: 08/17/2019 09:22 AM   Modules accepted: Orders

## 2019-08-23 ENCOUNTER — Other Ambulatory Visit: Payer: Self-pay | Admitting: Gastroenterology

## 2019-08-23 ENCOUNTER — Ambulatory Visit (INDEPENDENT_AMBULATORY_CARE_PROVIDER_SITE_OTHER): Payer: Medicare HMO

## 2019-08-23 DIAGNOSIS — Z1159 Encounter for screening for other viral diseases: Secondary | ICD-10-CM

## 2019-08-24 LAB — SARS CORONAVIRUS 2 (TAT 6-24 HRS): SARS Coronavirus 2: NEGATIVE

## 2019-08-25 ENCOUNTER — Ambulatory Visit (AMBULATORY_SURGERY_CENTER): Payer: Medicare HMO | Admitting: Gastroenterology

## 2019-08-25 ENCOUNTER — Encounter: Payer: Self-pay | Admitting: Gastroenterology

## 2019-08-25 ENCOUNTER — Other Ambulatory Visit: Payer: Self-pay

## 2019-08-25 VITALS — BP 130/63 | HR 50 | Temp 97.5°F | Resp 15 | Ht 71.0 in | Wt 219.0 lb

## 2019-08-25 DIAGNOSIS — Z86010 Personal history of colonic polyps: Secondary | ICD-10-CM | POA: Diagnosis not present

## 2019-08-25 DIAGNOSIS — D122 Benign neoplasm of ascending colon: Secondary | ICD-10-CM

## 2019-08-25 DIAGNOSIS — D120 Benign neoplasm of cecum: Secondary | ICD-10-CM

## 2019-08-25 DIAGNOSIS — D124 Benign neoplasm of descending colon: Secondary | ICD-10-CM | POA: Diagnosis not present

## 2019-08-25 MED ORDER — SODIUM CHLORIDE 0.9 % IV SOLN: 500.0000 mL | Freq: Once | INTRAVENOUS | Status: DC

## 2019-08-25 NOTE — Patient Instructions (Signed)
Handouts on polyps and hemorrhoids given to you today.  Await pathology results.   YOU HAD AN ENDOSCOPIC PROCEDURE TODAY AT [facility]:   Refer to the procedure report that was given to you for any specific questions about what was found during the examination.  If the procedure report does not answer your questions, please call your gastroenterologist to clarify.  If you requested that your care partner not be given the details of your procedure findings, then the procedure report has been included in a sealed envelope for you to review at your convenience later.  YOU SHOULD EXPECT: Some feelings of bloating in the abdomen. Passage of more gas than usual.  Walking can help get rid of the air that was put into your GI tract during the procedure and reduce the bloating. If you had a lower endoscopy (such as a colonoscopy or flexible sigmoidoscopy) you may notice spotting of blood in your stool or on the toilet paper. If you underwent a bowel prep for your procedure, you may not have a normal bowel movement for a few days.  Please Note:  You might notice some irritation and congestion in your nose or some drainage.  This is from the oxygen used during your procedure.  There is no need for concern and it should clear up in a day or so.  SYMPTOMS TO REPORT IMMEDIATELY:   Following lower endoscopy (colonoscopy or flexible sigmoidoscopy):  Excessive amounts of blood in the stool  Significant tenderness or worsening of abdominal pains  Swelling of the abdomen that is new, acute  Fever of 100F or higher  For urgent or emergent issues, a gastroenterologist can be reached at any hour by calling [phone].   DIET:  We do recommend a small meal at first, but then you may proceed to your regular diet.  Drink plenty of fluids but you should avoid alcoholic beverages for 24 hours.  ACTIVITY:  You should plan to take it easy for the rest of today and you should NOT DRIVE or use heavy  machinery until tomorrow (because of the sedation medicines used during the test).    FOLLOW UP: Our staff will call the number listed on your records 48-72 hours following your procedure to check on you and address any questions or concerns that you may have regarding the information given to you following your procedure. If we do not reach you, we will leave a message.  We will attempt to reach you two times.  During this call, we will ask if you have developed any symptoms of COVID 19. If you develop any symptoms (ie: fever, flu-like symptoms, shortness of breath, cough etc.) before then, please call [phone].  If you test positive for Covid 19 in the 2 weeks post procedure, please call and report this information to [place].    If any biopsies were taken you will be contacted by phone or by letter within the next 1-3 weeks.  Please call us at [phone] if you have not heard about the biopsies in 3 weeks.    SIGNATURES/CONFIDENTIALITY: You and/or your care partner have signed paperwork which will be entered into your electronic medical record.  These signatures attest to the fact that that the information above on your After Visit Summary has been reviewed and is understood.  Full responsibility of the confidentiality of this discharge information lies with you and/or your care-partner.

## 2019-08-25 NOTE — Op Note (Signed)
[facility] Patient Name: Ryan [name] Procedure Date: 08/25/2019 1:33 PM MRN: [id number] Endoscopist: Mallie Mussel L. Loletha Carrow , MD Age: 74 Referring MD:  Date of Birth: 1946/06/19 Gender: Male Account #: [id number] Procedure:                Colonoscopy Indications:              Surveillance: History of adenomatous polyps,                            inadequate prep on last exam (<16yr) - two                            diminutive polyps 06/2018 poor prep, also < 32mm TA                            05/2013 Medicines:                Monitored Anesthesia Care Procedure:                Pre-Anesthesia Assessment:                           - Prior to the procedure, a History and Physical                            was performed, and patient medications and                            allergies were reviewed. The patient's tolerance of                            previous anesthesia was also reviewed. The risks                            and benefits of the procedure and the sedation                            options and risks were discussed with the patient.                            All questions were answered, and informed consent                            was obtained. Prior Anticoagulants: The patient has                            taken no previous anticoagulant or antiplatelet                            agents except for aspirin. ASA Grade Assessment:                            III - A patient with severe systemic disease. After  reviewing the risks and benefits, the patient was                            deemed in satisfactory condition to undergo the                            procedure.                           After obtaining informed consent, the colonoscope                            was passed under direct vision. Throughout the                            procedure, the patient's blood pressure, pulse, and                            oxygen saturations  were monitored continuously. The                            Colonoscope was introduced through the anus and                            advanced to the the terminal ileum, with                            identification of the appendiceal orifice and IC                            valve. The colonoscopy was performed with                            difficulty due to a redundant colon and significant                            looping. Successful completion of the procedure was                            aided by changing the patient to a semi-prone                            position and using manual pressure. The quality of                            the bowel preparation was good. The bowel                            preparation used was 2 day Suprep/Miralax. Scope In: 1:50:08 PM Scope Out: 2:24:37 PM Scope Withdrawal Time: 0 hours 15 minutes 41 seconds  Total Procedure Duration: 0 hours 34 minutes 29 seconds  Findings:                 The perianal and digital rectal examinations were  normal.                           Three sessile polyps were found in the ascending                            colon and cecum. The polyps were 2 to 8 mm in size.                            These polyps were removed with a cold snare.                            Resection and retrieval were complete.                           A 5 mm polyp was found in the descending colon. The                            polyp was sessile. The polyp was removed with a                            cold snare. Resection and retrieval were complete.                           The sigmoid colon was significantly redundant as                            before, causing scope looping.                           Internal hemorrhoids were found.                           The exam was otherwise without abnormality on                            direct and retroflexion views. Complications:            No immediate  complications. Estimated Blood Loss:     Estimated blood loss was minimal. Impression:               - Three 2 to 8 mm polyps in the ascending colon and                            in the cecum, removed with a cold snare. Resected                            and retrieved.                           - One 5 mm polyp in the descending colon, removed                            with a cold snare. Resected and retrieved.                           -  Redundant colon.                           - Internal hemorrhoids.                           - The examination was otherwise normal on direct                            and retroflexion views. Recommendation:           - Patient has a contact number available for                            emergencies. The signs and symptoms of potential                            delayed complications were discussed with the                            patient. Return to normal activities tomorrow.                            Written discharge instructions were provided to the                            patient.                           - Resume previous diet.                           - Continue present medications.                           - Await pathology results.                           - Based on current guidelines, no repeat routine                            surveillance colonoscopy. Raihana Balderrama L. Loletha Carrow, MD 08/25/2019 2:35:51 PM This report has been signed electronically.

## 2019-08-25 NOTE — Progress Notes (Signed)
Called to room to assist during endoscopic procedure.  Patient ID and intended procedure confirmed with present staff. Received instructions for my participation in the procedure from the performing physician.  

## 2019-08-25 NOTE — Progress Notes (Signed)
Pt's states no medical or surgical changes since previsit or office visit. 

## 2019-08-25 NOTE — Progress Notes (Signed)
PT taken to PACU. Monitors in place. VSS. Report given to RN. 

## 2019-08-27 ENCOUNTER — Telehealth: Payer: Self-pay

## 2019-08-27 NOTE — Telephone Encounter (Signed)
  Follow up Call-  Call back number 08/25/2019 06/30/2018  Post procedure Call Back phone  # [phone]  Permission to leave phone message Yes Yes  Some recent data might be hidden     Patient questions:  Do you have a fever, pain , or abdominal swelling? No. Pain Score  0 *  Have you tolerated food without any problems? Yes.    Have you been able to return to your normal activities? Yes.    Do you have any questions about your discharge instructions: Diet   No. Medications  No. Follow up visit  No.  Do you have questions or concerns about your Care? No.  Actions: * If pain score is 4 or above: No action needed, pain <4.  1. Have you developed a fever since your procedure? no  2.   Have you had an respiratory symptoms (SOB or cough) since your procedure? no  3.   Have you tested positive for COVID 19 since your procedure no  4.   Have you had any family members/close contacts diagnosed with the COVID 19 since your procedure?  no   If yes to any of these questions please route to Joylene John, RN and Alphonsa Gin, Therapist, sports.

## 2019-08-31 ENCOUNTER — Encounter: Payer: Self-pay | Admitting: Gastroenterology

## 2019-09-07 DIAGNOSIS — L812 Freckles: Secondary | ICD-10-CM | POA: Diagnosis not present

## 2019-09-07 DIAGNOSIS — D1801 Hemangioma of skin and subcutaneous tissue: Secondary | ICD-10-CM | POA: Diagnosis not present

## 2019-09-07 DIAGNOSIS — Z85828 Personal history of other malignant neoplasm of skin: Secondary | ICD-10-CM | POA: Diagnosis not present

## 2019-09-07 DIAGNOSIS — L570 Actinic keratosis: Secondary | ICD-10-CM | POA: Diagnosis not present

## 2019-09-07 DIAGNOSIS — L821 Other seborrheic keratosis: Secondary | ICD-10-CM | POA: Diagnosis not present

## 2019-09-20 ENCOUNTER — Encounter: Payer: Self-pay | Admitting: Family Medicine

## 2019-09-20 ENCOUNTER — Ambulatory Visit: Payer: Medicare HMO

## 2019-09-20 ENCOUNTER — Ambulatory Visit (INDEPENDENT_AMBULATORY_CARE_PROVIDER_SITE_OTHER): Payer: Medicare HMO | Admitting: Family Medicine

## 2019-09-20 ENCOUNTER — Other Ambulatory Visit: Payer: Self-pay

## 2019-09-20 VITALS — BP 118/62 | HR 56 | Temp 97.2°F | Ht 71.0 in | Wt 219.5 lb

## 2019-09-20 DIAGNOSIS — E039 Hypothyroidism, unspecified: Secondary | ICD-10-CM

## 2019-09-20 DIAGNOSIS — E7800 Pure hypercholesterolemia, unspecified: Secondary | ICD-10-CM

## 2019-09-20 DIAGNOSIS — R351 Nocturia: Secondary | ICD-10-CM | POA: Diagnosis not present

## 2019-09-20 DIAGNOSIS — Z1159 Encounter for screening for other viral diseases: Secondary | ICD-10-CM

## 2019-09-20 DIAGNOSIS — Z0001 Encounter for general adult medical examination with abnormal findings: Secondary | ICD-10-CM | POA: Diagnosis not present

## 2019-09-20 DIAGNOSIS — N401 Enlarged prostate with lower urinary tract symptoms: Secondary | ICD-10-CM

## 2019-09-20 DIAGNOSIS — R739 Hyperglycemia, unspecified: Secondary | ICD-10-CM | POA: Diagnosis not present

## 2019-09-20 DIAGNOSIS — I2510 Atherosclerotic heart disease of native coronary artery without angina pectoris: Secondary | ICD-10-CM

## 2019-09-20 DIAGNOSIS — J4520 Mild intermittent asthma, uncomplicated: Secondary | ICD-10-CM | POA: Diagnosis not present

## 2019-09-20 LAB — CBC
HCT: 42.8 % (ref 39.0–52.0)
Hemoglobin: 14.4 g/dL (ref 13.0–17.0)
MCHC: 33.7 g/dL (ref 30.0–36.0)
MCV: 95.1 fl (ref 78.0–100.0)
Platelets: 245 10*3/uL (ref 150.0–400.0)
RBC: 4.5 Mil/uL (ref 4.22–5.81)
RDW: 12.9 % (ref 11.5–15.5)
WBC: 5.9 10*3/uL (ref 4.0–10.5)

## 2019-09-20 LAB — COMPREHENSIVE METABOLIC PANEL
ALT: 23 U/L (ref 0–53)
AST: 15 U/L (ref 0–37)
Albumin: 4.1 g/dL (ref 3.5–5.2)
Alkaline Phosphatase: 98 U/L (ref 39–117)
BUN: 19 mg/dL (ref 6–23)
CO2: 29 mEq/L (ref 19–32)
Calcium: 9.4 mg/dL (ref 8.4–10.5)
Chloride: 103 mEq/L (ref 96–112)
Creatinine, Ser: 0.93 mg/dL (ref 0.40–1.50)
GFR: 79.22 mL/min (ref >60.00)
Glucose, Bld: 91 mg/dL (ref 70–99)
Potassium: 4.7 mEq/L (ref 3.5–5.1)
Sodium: 139 mEq/L (ref 135–145)
Total Bilirubin: 0.6 mg/dL (ref 0.2–1.2)
Total Protein: 6.2 g/dL (ref 6.0–8.3)

## 2019-09-20 LAB — LIPID PANEL
Cholesterol: 115 mg/dL (ref 0–200)
HDL: 39.2 mg/dL (ref >39.00)
LDL Cholesterol: 60 mg/dL (ref 0–99)
NonHDL: 76.11
Total CHOL/HDL Ratio: 3
Triglycerides: 83 mg/dL (ref 0.0–149.0)
VLDL: 16.6 mg/dL (ref 0.0–40.0)

## 2019-09-20 LAB — TSH: TSH: 4.13 u[IU]/mL (ref 0.35–4.50)

## 2019-09-20 LAB — HEMOGLOBIN A1C: Hgb A1c MFr Bld: 5.9 % (ref 4.6–6.5)

## 2019-09-20 LAB — PSA, MEDICARE: PSA: 3.05 ng/ml (ref 0.10–4.00)

## 2019-09-20 NOTE — Assessment & Plan Note (Signed)
Stable.  Will follow up with cardiology soon.

## 2019-09-20 NOTE — Assessment & Plan Note (Signed)
Check TSH.  Continue Synthroid 88 mcg daily. 

## 2019-09-20 NOTE — Progress Notes (Signed)
Chief Complaint:  Ryan [name] is a 74 y.o. male who presents today for his annual comprehensive physical exam.    Assessment/Plan:  Chronic Problems Addressed Today: Hypothyroidism Check TSH.  Continue Synthroid 88 mcg daily.  BPH associated with nocturia Still wakes up twice per night to urinate.  Discussed alternatives including trial of Cardura or finasteride however patient declined due to not wanting any additional side effects.  May need referral to urology if continues to be problematic.  Asthma Stable.  Continue albuterol as needed.  CAD (coronary artery disease) Stable.  Will follow up with cardiology soon.  Hypercholesterolemia Stable.  Continue Lipitor 80 mg daily.  Check CBC, C met, TSH, and lipid panel.  Patient would like to come back in 6 months to recheck these labs as well.   Body mass index is 30.61 kg/m. / Obese BMI Metric Follow Up - 09/20/19 0910      BMI Metric Follow Up-Please document annually   BMI Metric Follow Up  Education provided        Preventative Healthcare: Check CBC, C met, TSH, lipid panel.  Check PSA.  Check hep C antibody.  Has already received 2 doses of Covid vaccine.  Has had colonoscopy earlier this year.  Patient Counseling(The following topics were reviewed and/or handout was given):  -Nutrition: Stressed importance of moderation in sodium/caffeine intake, saturated fat and cholesterol, caloric balance, sufficient intake of fresh fruits, vegetables, and fiber.  -Stressed the importance of regular exercise.   -Substance Abuse: Discussed cessation/primary prevention of tobacco, alcohol, or other drug use; driving or other dangerous activities under the influence; availability of treatment for abuse.   -Injury prevention: Discussed safety belts, safety helmets, smoke detector, smoking near bedding or upholstery.   -Sexuality: Discussed sexually transmitted diseases, partner selection, use of condoms, avoidance of unintended  pregnancy and contraceptive alternatives.   -Dental health: Discussed importance of regular tooth brushing, flossing, and dental visits.  -Health maintenance and immunizations reviewed. Please refer to Health maintenance section.  Return to care in 1 year for next preventative visit.     Subjective:  HPI:  He has no acute complaints today.   Lifestyle Diet: Balanced. Plenty of fruits and vegetables.  Exercise: Walking and playing golf.   Depression screen PHQ 2/9 09/20/2019  Decreased Interest 0  Down, Depressed, Hopeless 0  PHQ - 2 Score 0    Health Maintenance Due  Topic Date Due  . Hepatitis C Screening  09-Jul-1946     ROS: Per HPI, otherwise a complete review of systems was negative.   PMH:  The following were reviewed and entered/updated in epic: Past Medical History:  Diagnosis Date  . Allergy    seasonal  . Anxiety   . Arthritis   . Asthma   . Cataract   . Coronary artery disease 12/08/2016   STEMI, DES Circumflex  . GERD (gastroesophageal reflux disease)   . Hyperlipidemia   . Hypothyroidism   . STEMI (ST elevation myocardial infarction) ([place]) 12/08/2016   Patient Active Problem List   Diagnosis Date Noted  . GERD (gastroesophageal reflux disease) 09/14/2018  . Asthma 09/14/2018  . Spinal stenosis of lumbar region with neurogenic claudication 08/13/2017  . CAD (coronary artery disease) 12/17/2016  . Hypercholesterolemia 12/10/2016  . History of skin cancer 08/28/2016  . BPH associated with nocturia 05/17/2014  . Hypothyroidism 03/11/2007   Past Surgical History:  Procedure Laterality Date  . cataract Bilateral 03/2016  . COLONOSCOPY  10.27.2005  . COLONOSCOPY  06/30/2018  . epidural steroid shot     x 3 , lumbar spine area  . LEFT HEART CATH AND CORONARY ANGIOGRAPHY N/A 12/08/2016   Procedure: Left Heart Cath and Coronary Angiography;  Surgeon: Sherren Mocha, MD;  Location: [facility];  Service: Cardiovascular;  Laterality: N/A;  .  POLYPECTOMY    . SHOULDER SURGERY Right    Rotator Cuff Tear Repair  . TONSILLECTOMY    . UPPER GASTROINTESTINAL ENDOSCOPY      Family History  Problem Relation Age of Onset  . Diabetes Mother   . Heart failure Mother   . Irritable bowel syndrome Mother   . Diverticulosis Mother   . Lung cancer Father        worked in [place]  . Heart disease Sister   . Irritable bowel syndrome Sister   . Diverticulosis Sister   . Colon cancer Neg Hx   . Esophageal cancer Neg Hx   . Pancreatic cancer Neg Hx   . Stomach cancer Neg Hx     Medications- reviewed and updated Current Outpatient Medications  Medication Sig Dispense Refill  . acetaminophen (TYLENOL) 325 MG tablet Take 325-650 mg by mouth every 6 (six) hours as needed for mild pain or moderate pain.    Marland Kitchen albuterol (PROVENTIL HFA;VENTOLIN HFA) 108 (90 Base) MCG/ACT inhaler Inhale 2 puffs into the lungs every 6 (six) hours as needed for wheezing. 1 Inhaler 1  . aspirin EC 81 MG EC tablet Take 1 tablet (81 mg total) by mouth daily. 30 tablet 11  . atorvastatin (LIPITOR) 80 MG tablet TAKE 1 TABLET (80 MG TOTAL) DAILY AT 6 PM 90 tablet 2  . bisacodyl (DULCOLAX) 5 MG EC tablet Take 5 mg by mouth daily as needed for moderate constipation.    Marland Kitchen co-enzyme Q-10 30 MG capsule Take 30 mg by mouth daily.     . fluorouracil (EFUDEX) 5 % cream Apply 1 application topically as needed.     Marland Kitchen HYDROcodone-homatropine (HYCODAN) 5-1.5 MG/5ML syrup Take 5 mLs by mouth every 8 (eight) hours as needed for cough. 120 mL 0  . Melatonin 5 MG TABS Take 5 mg by mouth at bedtime as needed (sleep).    . Multiple Vitamins-Minerals (CENTRUM ADULTS PO) Take 1 tablet by mouth daily.    . nitroGLYCERIN (NITROSTAT) 0.4 MG SL tablet Place 1 tablet (0.4 mg total) under the tongue every 5 (five) minutes as needed for chest pain. 25 tablet 11  . pantoprazole (PROTONIX) 20 MG tablet Take 20 mg by mouth daily.    . polyethylene glycol powder (GLYCOLAX/MIRALAX) powder  Take 17 g by mouth as needed for mild constipation.     Marland Kitchen SYNTHROID 88 MCG tablet Take 1 tablet (88 mcg total) by mouth daily. 90 tablet 0  . tamsulosin (FLOMAX) 0.4 MG CAPS capsule TAKE 2 CAPSULES(0.8 MG) BY MOUTH DAILY 180 capsule 0   No current facility-administered medications for this visit.    Allergies-reviewed and updated Allergies  Allergen Reactions  . Contrast Media [Iodinated Diagnostic Agents] Shortness Of Breath and Swelling    Swelling of the throat   . Erythromycin Nausea And Vomiting  . Niacin And Related Swelling  . Penicillins Hives    Has patient had a PCN reaction causing immediate rash, facial/tongue/throat swelling, SOB or lightheadedness with hypotension: Y Has patient had a PCN reaction causing severe rash involving mucus membranes or skin necrosis: No Has patient had a PCN reaction that required hospitalization: No Has  patient had a PCN reaction occurring within the last 10 years: No If all of the above answers are "NO", then may proceed with Cephalosporin use.    Social History   Socioeconomic History  . Marital status: Married    Spouse name: Not on file  . Number of children: 2  . Years of education: Not on file  . Highest education level: Not on file  Occupational History  . Occupation: retired  Tobacco Use  . Smoking status: Never Smoker  . Smokeless tobacco: Never Used  Substance and Sexual Activity  . Alcohol use: Yes    Comment: occasional  . Drug use: No  . Sexual activity: Not on file  Other Topics Concern  . Not on file  Social History Narrative  . Not on file   Social Determinants of Health   Financial Resource Strain:   . Difficulty of Paying Living Expenses: Not on file  Food Insecurity:   . Worried About Charity fundraiser in the Last Year: Not on file  . Ran Out of Food in the Last Year: Not on file  Transportation Needs:   . Lack of Transportation (Medical): Not on file  . Lack of Transportation (Non-Medical): Not on  file  Physical Activity:   . Days of Exercise per Week: Not on file  . Minutes of Exercise per Session: Not on file  Stress:   . Feeling of Stress : Not on file  Social Connections:   . Frequency of Communication with Friends and Family: Not on file  . Frequency of Social Gatherings with Friends and Family: Not on file  . Attends Religious Services: Not on file  . Active Member of Clubs or Organizations: Not on file  . Attends Archivist Meetings: Not on file  . Marital Status: Not on file        Objective:  Physical Exam: BP 118/62   Pulse (!) 56   Temp (!) 97.2 F (36.2 C)   Ht 5' 11"  (1.803 m)   Wt 219 lb 8 oz (99.6 kg)   SpO2 93%   BMI 30.61 kg/m   Body mass index is 30.61 kg/m. Wt Readings from Last 3 Encounters:  09/20/19 219 lb 8 oz (99.6 kg)  08/25/19 219 lb (99.3 kg)  08/16/19 219 lb (99.3 kg)   Gen: NAD, resting comfortably HEENT: TMs normal bilaterally. OP clear. No thyromegaly noted.  CV: RRR with no murmurs appreciated Pulm: NWOB, CTAB with no crackles, wheezes, or rhonchi GI: Normal bowel sounds present. Soft, Nontender, Nondistended. MSK: no edema, cyanosis, or clubbing noted Skin: warm, dry Neuro: CN2-12 grossly intact. Strength 5/5 in upper and lower extremities. Reflexes symmetric and intact bilaterally.  Psych: Normal affect and thought content     Staisha Winiarski M. [name], MD 09/20/2019 9:10 AM

## 2019-09-20 NOTE — Patient Instructions (Signed)
It was very nice to see you today!  We will check blood work today.  You can come back in 6 months to recheck.  Please come back in 1 year for your next physical with blood work.  Take care, Dr [name]  Please try these tips to maintain a healthy lifestyle:   Eat at least 3 REAL meals and 1-2 snacks per day.  Aim for no more than 5 hours between eating.  If you eat breakfast, please do so within one hour of getting up.    Each meal should contain half fruits/vegetables, one quarter protein, and one quarter carbs (no bigger than a computer mouse)   Cut down on sweet beverages. This includes juice, soda, and sweet tea.     Drink at least 1 glass of water with each meal and aim for at least 8 glasses per day   Exercise at least 150 minutes every week.    Preventive Care 35 Years and Older, Male Preventive care refers to lifestyle choices and visits with your health care provider that can promote health and wellness. This includes:  A yearly physical exam. This is also called an annual well check.  Regular dental and eye exams.  Immunizations.  Screening for certain conditions.  Healthy lifestyle choices, such as diet and exercise. What can I expect for my preventive care visit? Physical exam Your health care provider will check:  Height and weight. These may be used to calculate body mass index (BMI), which is a measurement that tells if you are at a healthy weight.  Heart rate and blood pressure.  Your skin for abnormal spots. Counseling Your health care provider may ask you questions about:  Alcohol, tobacco, and drug use.  Emotional well-being.  Home and relationship well-being.  Sexual activity.  Eating habits.  History of falls.  Memory and ability to understand (cognition).  Work and work Statistician. What immunizations do I need?  Influenza (flu) vaccine  This is recommended every year. Tetanus, diphtheria, and pertussis (Tdap) vaccine   You may need a Td booster every 10 years. Varicella (chickenpox) vaccine  You may need this vaccine if you have not already been vaccinated. Zoster (shingles) vaccine  You may need this after age 63. Pneumococcal conjugate (PCV13) vaccine  One dose is recommended after age 47. Pneumococcal polysaccharide (PPSV23) vaccine  One dose is recommended after age 39. Measles, mumps, and rubella (MMR) vaccine  You may need at least one dose of MMR if you were born in 1957 or later. You may also need a second dose. Meningococcal conjugate (MenACWY) vaccine  You may need this if you have certain conditions. Hepatitis A vaccine  You may need this if you have certain conditions or if you travel or work in places where you may be exposed to hepatitis A. Hepatitis B vaccine  You may need this if you have certain conditions or if you travel or work in places where you may be exposed to hepatitis B. Haemophilus influenzae type b (Hib) vaccine  You may need this if you have certain conditions. You may receive vaccines as individual doses or as more than one vaccine together in one shot (combination vaccines). Talk with your health care provider about the risks and benefits of combination vaccines. What tests do I need? Blood tests  Lipid and cholesterol levels. These may be checked every 5 years, or more frequently depending on your overall health.  Hepatitis C test.  Hepatitis B test. Screening  Lung  cancer screening. You may have this screening every year starting at age 57 if you have a 30-pack-year history of smoking and currently smoke or have quit within the past 15 years.  Colorectal cancer screening. All adults should have this screening starting at age 38 and continuing until age 69. Your health care provider may recommend screening at age 67 if you are at increased risk. You will have tests every 1-10 years, depending on your results and the type of screening test.  Prostate cancer  screening. Recommendations will vary depending on your family history and other risks.  Diabetes screening. This is done by checking your blood sugar (glucose) after you have not eaten for a while (fasting). You may have this done every 1-3 years.  Abdominal aortic aneurysm (AAA) screening. You may need this if you are a current or former smoker.  Sexually transmitted disease (STD) testing. Follow these instructions at home: Eating and drinking  Eat a diet that includes fresh fruits and vegetables, whole grains, lean protein, and low-fat dairy products. Limit your intake of foods with high amounts of sugar, saturated fats, and salt.  Take vitamin and mineral supplements as recommended by your health care provider.  Do not drink alcohol if your health care provider tells you not to drink.  If you drink alcohol: ? Limit how much you have to 0-2 drinks a day. ? Be aware of how much alcohol is in your drink. In the [place] one drink equals one 12 oz bottle of beer (355 mL), one 5 oz glass of wine (148 mL), or one 1 oz glass of hard liquor (44 mL). Lifestyle  Take daily care of your teeth and gums.  Stay active. Exercise for at least 30 minutes on 5 or more days each week.  Do not use any products that contain nicotine or tobacco, such as cigarettes, e-cigarettes, and chewing tobacco. If you need help quitting, ask your health care provider.  If you are sexually active, practice safe sex. Use a condom or other form of protection to prevent STIs (sexually transmitted infections).  Talk with your health care provider about taking a low-dose aspirin or statin. What's next?  Visit your health care provider once a year for a well check visit.  Ask your health care provider how often you should have your eyes and teeth checked.  Stay up to date on all vaccines. This information is not intended to replace advice given to you by your health care provider. Make sure you discuss any questions  you have with your health care provider. Document Revised: 07/16/2018 Document Reviewed: 07/16/2018 Elsevier Patient Education  2020 [employer].

## 2019-09-20 NOTE — Assessment & Plan Note (Signed)
Stable.  Continue Lipitor 80 mg daily.  Check CBC, C met, TSH, and lipid panel.  Patient would like to come back in 6 months to recheck these labs as well.

## 2019-09-20 NOTE — Assessment & Plan Note (Signed)
Stable. Continue albuterol as needed.  

## 2019-09-20 NOTE — Assessment & Plan Note (Signed)
Still wakes up twice per night to urinate.  Discussed alternatives including trial of Cardura or finasteride however patient declined due to not wanting any additional side effects.  May need referral to urology if continues to be problematic.

## 2019-09-21 LAB — HEPATITIS C ANTIBODY
Hepatitis C Ab: NONREACTIVE
SIGNAL TO CUT-OFF: 0.01 (ref <1.00)

## 2019-09-21 NOTE — Progress Notes (Signed)
Please inform patient of the following:  Blood sugar is borderline elevated but the rest of the labs are normal.  No need to start medications.  Would like him to continue working on diet and exercise and we can recheck in 1 year.

## 2019-10-07 DIAGNOSIS — R69 Illness, unspecified: Secondary | ICD-10-CM | POA: Diagnosis not present

## 2019-11-08 ENCOUNTER — Other Ambulatory Visit: Payer: Self-pay | Admitting: Family Medicine

## 2019-11-15 ENCOUNTER — Telehealth: Payer: Self-pay | Admitting: Family Medicine

## 2019-11-15 NOTE — Telephone Encounter (Signed)
I called the patient to reschedule AWV with Ryan [name], but he declined.  He said that he didn't see the need for it.

## 2019-12-08 DIAGNOSIS — M5136 Other intervertebral disc degeneration, lumbar region: Secondary | ICD-10-CM | POA: Diagnosis not present

## 2019-12-08 DIAGNOSIS — M5416 Radiculopathy, lumbar region: Secondary | ICD-10-CM | POA: Diagnosis not present

## 2019-12-23 ENCOUNTER — Telehealth: Payer: Self-pay

## 2019-12-23 DIAGNOSIS — E039 Hypothyroidism, unspecified: Secondary | ICD-10-CM

## 2019-12-23 DIAGNOSIS — Z23 Encounter for immunization: Secondary | ICD-10-CM

## 2019-12-23 DIAGNOSIS — L820 Inflamed seborrheic keratosis: Secondary | ICD-10-CM

## 2019-12-23 DIAGNOSIS — E032 Hypothyroidism due to medicaments and other exogenous substances: Secondary | ICD-10-CM

## 2019-12-23 DIAGNOSIS — J302 Other seasonal allergic rhinitis: Secondary | ICD-10-CM

## 2019-12-23 DIAGNOSIS — E038 Other specified hypothyroidism: Secondary | ICD-10-CM

## 2019-12-23 MED ORDER — SYNTHROID 88 MCG PO TABS: 88.0000 ug | ORAL_TABLET | Freq: Every day | ORAL | 0 refills | Status: DC

## 2019-12-23 NOTE — Telephone Encounter (Signed)
  LAST APPOINTMENT DATE: 11/15/2019   NEXT APPOINTMENT DATE:@Visit  date not found  MEDICATION:SYNTHROID 88 MCG tablet  PHARMACY: [id number] - [place], [place] - [address] Phone:  [phone]  Fax:  [phone]     Patient would like a call when the prescription is sent in.  **Let patient know to contact pharmacy at the end of the day to make sure medication is ready. **  ** Please notify patient to allow 48-72 hours to process**  **Encourage patient to contact the pharmacy for refills or they can request refills through [facility]**  CLINICAL FILLS OUT ALL BELOW:   LAST REFILL:  QTY:  REFILL DATE:    OTHER COMMENTS:    Okay for refill?  Please advise

## 2019-12-23 NOTE — Addendum Note (Signed)
Addended bySerita Sheller on: 12/23/2019 01:32 PM   Modules accepted: Orders

## 2019-12-23 NOTE — Telephone Encounter (Signed)
Synthroid 88 mcg sent to patient's pharmacy.

## 2019-12-28 DIAGNOSIS — M47816 Spondylosis without myelopathy or radiculopathy, lumbar region: Secondary | ICD-10-CM | POA: Diagnosis not present

## 2020-01-07 ENCOUNTER — Other Ambulatory Visit: Payer: Self-pay

## 2020-01-07 ENCOUNTER — Ambulatory Visit: Payer: Medicare HMO | Admitting: Cardiovascular Disease

## 2020-01-07 ENCOUNTER — Encounter: Payer: Self-pay | Admitting: Cardiovascular Disease

## 2020-01-07 VITALS — BP 130/72 | HR 49 | Ht 71.0 in | Wt 211.0 lb

## 2020-01-07 DIAGNOSIS — E782 Mixed hyperlipidemia: Secondary | ICD-10-CM

## 2020-01-07 DIAGNOSIS — I2510 Atherosclerotic heart disease of native coronary artery without angina pectoris: Secondary | ICD-10-CM | POA: Diagnosis not present

## 2020-01-07 NOTE — Progress Notes (Signed)
Cardiology Office Note:    Date:  01/07/2020   ID:  Ryan [name], DOB Feb 16, 1946, MRN [id number]  PCP:  Vivi Barrack, MD  [facility] Cardiologist:  Sherren Mocha, MD  [place] Electrophysiologist:  None   Referring MD: Vivi Barrack, MD   Chief Complaint  Patient presents with  . Coronary Artery Disease    History of Present Illness:    Ryan [name] is a 74 y.o. male with a hx of coronary artery disease, presenting for follow-up evaluation.  The patient is here alone today.  He was seen last in a telehealth visit in May 2020 during the COVID-19 pandemic.  The patient initially presented in 2018 with a left circumflex STEMI, treated with PCI using a drug-eluting stent.  He is living most of the time down in [place].  He and his wife still have a home here.  He denies any recent chest pain, chest pressure, or shortness of breath.  He denies orthopnea, PND, or heart palpitations.  He is primarily limited by low back pain with some pain and weakness radiating down into the legs.  This is his primary limitation.  Past Medical History:  Diagnosis Date  . Allergy    seasonal  . Anxiety   . Arthritis   . Asthma   . Cataract   . Coronary artery disease 12/08/2016   STEMI, DES Circumflex  . GERD (gastroesophageal reflux disease)   . Hyperlipidemia   . Hypothyroidism   . STEMI (ST elevation myocardial infarction) ([place]) 12/08/2016    Past Surgical History:  Procedure Laterality Date  . cataract Bilateral 03/2016  . COLONOSCOPY  10.27.2005  . COLONOSCOPY  06/30/2018  . epidural steroid shot     x 3 , lumbar spine area  . LEFT HEART CATH AND CORONARY ANGIOGRAPHY N/A 12/08/2016   Procedure: Left Heart Cath and Coronary Angiography;  Surgeon: Sherren Mocha, MD;  Location: [place] [facility];  Service: Cardiovascular;  Laterality: N/A;  . POLYPECTOMY    . SHOULDER SURGERY Right    Rotator Cuff Tear Repair  . TONSILLECTOMY    . UPPER GASTROINTESTINAL  ENDOSCOPY      Current Medications: Current Meds  Medication Sig  . acetaminophen (TYLENOL) 325 MG tablet Take 325-650 mg by mouth every 6 (six) hours as needed for mild pain or moderate pain.  Marland Kitchen albuterol (PROVENTIL HFA;VENTOLIN HFA) 108 (90 Base) MCG/ACT inhaler Inhale 2 puffs into the lungs every 6 (six) hours as needed for wheezing.  Marland Kitchen aspirin EC 81 MG EC tablet Take 1 tablet (81 mg total) by mouth daily.  Marland Kitchen atorvastatin (LIPITOR) 80 MG tablet TAKE 1 TABLET (80 MG TOTAL) DAILY AT 6 PM  . bisacodyl (DULCOLAX) 5 MG EC tablet Take 5 mg by mouth daily as needed for moderate constipation.  Marland Kitchen co-enzyme Q-10 30 MG capsule Take 30 mg by mouth daily.   . fluorouracil (EFUDEX) 5 % cream Apply 1 application topically as needed.   Marland Kitchen HYDROcodone-homatropine (HYCODAN) 5-1.5 MG/5ML syrup Take 5 mLs by mouth every 8 (eight) hours as needed for cough.  . Melatonin 5 MG TABS Take 5 mg by mouth at bedtime as needed (sleep).  . Multiple Vitamins-Minerals (CENTRUM ADULTS PO) Take 1 tablet by mouth daily.  . nitroGLYCERIN (NITROSTAT) 0.4 MG SL tablet Place 1 tablet (0.4 mg total) under the tongue every 5 (five) minutes as needed for chest pain.  . pantoprazole (PROTONIX) 20 MG tablet Take 20 mg by mouth daily.  Marland Kitchen  polyethylene glycol powder (GLYCOLAX/MIRALAX) powder Take 17 g by mouth as needed for mild constipation.   Marland Kitchen SYNTHROID 88 MCG tablet Take 1 tablet (88 mcg total) by mouth daily.  . tamsulosin (FLOMAX) 0.4 MG CAPS capsule TAKE 2 CAPSULES(0.8 MG) BY MOUTH DAILY     Allergies:   Contrast media [iodinated diagnostic agents], Erythromycin, Niacin and related, and Penicillins   Social History   Socioeconomic History  . Marital status: Married    Spouse name: Not on file  . Number of children: 2  . Years of education: Not on file  . Highest education level: Not on file  Occupational History  . Occupation: retired  Tobacco Use  . Smoking status: Never Smoker  . Smokeless tobacco: Never Used    Substance and Sexual Activity  . Alcohol use: Yes    Comment: occasional  . Drug use: No  . Sexual activity: Not on file  Other Topics Concern  . Not on file  Social History Narrative  . Not on file   Social Determinants of Health   Financial Resource Strain:   . Difficulty of Paying Living Expenses:   Food Insecurity:   . Worried About Charity fundraiser in the Last Year:   . Arboriculturist in the Last Year:   Transportation Needs:   . Film/video editor (Medical):   Marland Kitchen Lack of Transportation (Non-Medical):   Physical Activity:   . Days of Exercise per Week:   . Minutes of Exercise per Session:   Stress:   . Feeling of Stress :   Social Connections:   . Frequency of Communication with Friends and Family:   . Frequency of Social Gatherings with Friends and Family:   . Attends Religious Services:   . Active Member of Clubs or Organizations:   . Attends Archivist Meetings:   Marland Kitchen Marital Status:      Family History: The patient's family history includes Diabetes in his mother; Diverticulosis in his mother and sister; Heart disease in his sister; Heart failure in his mother; Irritable bowel syndrome in his mother and sister; Lung cancer in his father. There is no history of Colon cancer, Esophageal cancer, Pancreatic cancer, or Stomach cancer.  ROS:   Please see the history of present illness.    Positive for back pain and leg pain.  All other systems reviewed and are negative.  EKGs/Labs/Other Studies Reviewed:    The following studies were reviewed today: Cardiac Catheterization 12-08-2016: Conclusion  1. Severe thrombotic stenosis of the left circumflex treated successfully with Primary PCI using a 3.5x24 mm Promus DES 2. Moderate diffuse LAD, RCA, and ramus intermedius stenoses 3. Normal LV function  Recommend:  Aggressive medical therapy  The patient's residual CAD affects small caliber vessels and will be best managed medically  Aggrastat x 6  hours  DAPT with ASA and brilinta x 12 months without interruption  Anticipate discharge 48 hours if no complications   Coronary Findings  Diagnostic Dominance: Right Left Main  Vessel is angiographically normal.  Left Anterior Descending  Vessel is small.  Prox LAD to Mid LAD lesion 50% stenosed  .  Ramus Intermedius  Vessel is small.  Ramus lesion 80% stenosed  .  Left Circumflex  Prox Cx lesion 95% stenosed  The lesion is eccentric.  Right Coronary Artery  Mid RCA lesion 50% stenosed  .  Dist RCA lesion 40% stenosed  .  Intervention  Prox Cx lesion  Angioplasty  Lesion  crossed with guidewire using a WIRE COUGAR XT ST. 190CM. Pre-stent angioplasty was performed using a BALLOON MOZEC 2.50X14. A STENT PROMUS PREM MR 3.5X24 drug eluting stent was successfully placed. Post-stent angioplasty was not performed. The pre-interventional distal flow is normal (TIMI 3). The post-interventional distal flow is normal (TIMI 3). The intervention was successful . No complications occurred at this lesion. An EBU guide is used. Heparin and aggrastat are used for anticoagulation. A therapeutic ACT is achieved. The lesion is dilated with a 2.5 mm balloon, then stented with a 3.5x24 mm Promus DES.  There is a 0% residual stenosis post intervention.  Wall Motion             Left Heart  Left Ventricle The left ventricular size is normal. The left ventricular systolic function is normal. LV end diastolic pressure is normal. The left ventricular ejection fraction is 55-65% by visual estimate. No regional wall motion abnormalities.  Coronary Diagrams  Diagnostic Dominance: Right  Intervention     EKG:  EKG is ordered today.  The ekg ordered today demonstrates sinus bradycardia 49 bpm, otherwise within normal limits.  Recent Labs: 09/20/2019: ALT 23; BUN 19; Creatinine, Ser 0.93; Hemoglobin 14.4; Platelets 245.0; Potassium 4.7; Sodium 139; TSH 4.13  Recent Lipid Panel    Component  Value Date/Time   CHOL 115 09/20/2019 0857   CHOL 89 (L) 01/21/2017 0904   TRIG 83.0 09/20/2019 0857   HDL 39.20 09/20/2019 0857   HDL 31 (L) 01/21/2017 0904   CHOLHDL 3 09/20/2019 0857   VLDL 16.6 09/20/2019 0857   LDLCALC 60 09/20/2019 0857   LDLCALC 48 01/21/2017 0904   LDLDIRECT 144.0 05/13/2013 1036    Physical Exam:    VS:  BP 130/72   Pulse (!) 49   Ht 5\' 11"  (1.803 m)   Wt 211 lb (95.7 kg)   SpO2 96%   BMI 29.43 kg/m     Wt Readings from Last 3 Encounters:  01/07/20 211 lb (95.7 kg)  09/20/19 219 lb 8 oz (99.6 kg)  08/25/19 219 lb (99.3 kg)     GEN:  Well nourished, well developed in no acute distress HEENT: Normal NECK: No JVD; No carotid bruits LYMPHATICS: No lymphadenopathy CARDIAC: bradycardic and regular, no murmurs, rubs, gallops RESPIRATORY:  Clear to auscultation without rales, wheezing or rhonchi  ABDOMEN: Soft, non-tender, non-distended MUSCULOSKELETAL:  No edema; No deformity  SKIN: Warm and dry NEUROLOGIC:  Alert and oriented x 3 PSYCHIATRIC:  Normal affect   ASSESSMENT:    1. Coronary artery disease involving native coronary artery of native heart without angina pectoris   2. Mixed hyperlipidemia    PLAN:    In order of problems listed above:  1. Stable on current medical program.  His cardiac catheterization films from 2018 are reviewed today.  He did have moderate diffuse residual CAD in the LAD and RCA vessels.  He appears to be doing quite well on a combination of aspirin and atorvastatin.  He cannot tolerate a beta-blocker because of bradycardia.  I would like to see him back in 1 year unless problems arise. 2. Lipids are at goal with an LDL cholesterol 60 mg/dL on atorvastatin 80 mg daily.  ALT is 23 mg/dL.  Overall the patient is doing well from a cardiovascular perspective.  We discussed some alternative ways he might be able to exercise with his low back problems.   Medication Adjustments/Labs and Tests Ordered: Current medicines  are reviewed at length with the patient  today.  Concerns regarding medicines are outlined above.  Orders Placed This Encounter  Procedures  . EKG 12-Lead   No orders of the defined types were placed in this encounter.   Patient Instructions  Medication Instructions:  Your physician recommends that you continue on your current medications as directed. Please refer to the Current Medication list given to you today.  *If you need a refill on your cardiac medications before your next appointment, please call your pharmacy*   Lab Work: None If you have labs (blood work) drawn today and your tests are completely normal, you will receive your results only by: Marland Kitchen MyChart Message (if you have MyChart) OR . A paper copy in the mail If you have any lab test that is abnormal or we need to change your treatment, we will call you to review the results.   Testing/Procedures: None   Follow-Up: At [facility], you and your health needs are our priority.  As part of our continuing mission to provide you with exceptional heart care, we have created designated Provider Care Teams.  These Care Teams include your primary Cardiologist (physician) and Advanced Practice Providers (APPs -  Physician Assistants and Nurse Practitioners) who all work together to provide you with the care you need, when you need it.  We recommend signing up for the patient portal called "MyChart".  Sign up information is provided on this After Visit Summary.  MyChart is used to connect with patients for Virtual Visits (Telemedicine).  Patients are able to view lab/test results, encounter notes, upcoming appointments, etc.  Non-urgent messages can be sent to your provider as well.   To learn more about what you can do with MyChart, go to [email or web address].    Your next appointment:   12 month(s)  The format for your next appointment:   In Person  Provider:   You may see Sherren Mocha, MD or one of the following  Advanced Practice Providers on your designated Care Team:    Richardson Dopp, PA-C  Robbie Lis, [place]    Other Instructions      Signed, Sherren Mocha, MD  01/07/2020 3:32 PM    [facility]

## 2020-01-07 NOTE — Patient Instructions (Signed)
Medication Instructions:  Your physician recommends that you continue on your current medications as directed. Please refer to the Current Medication list given to you today.  *If you need a refill on your cardiac medications before your next appointment, please call your pharmacy*   Lab Work: None If you have labs (blood work) drawn today and your tests are completely normal, you will receive your results only by: Marland Kitchen MyChart Message (if you have MyChart) OR . A paper copy in the mail If you have any lab test that is abnormal or we need to change your treatment, we will call you to review the results.   Testing/Procedures: None   Follow-Up: At [facility], you and your health needs are our priority.  As part of our continuing mission to provide you with exceptional heart care, we have created designated Provider Care Teams.  These Care Teams include your primary Cardiologist (physician) and Advanced Practice Providers (APPs -  Physician Assistants and Nurse Practitioners) who all work together to provide you with the care you need, when you need it.  We recommend signing up for the patient portal called "MyChart".  Sign up information is provided on this After Visit Summary.  MyChart is used to connect with patients for Virtual Visits (Telemedicine).  Patients are able to view lab/test results, encounter notes, upcoming appointments, etc.  Non-urgent messages can be sent to your provider as well.   To learn more about what you can do with MyChart, go to [email or web address].    Your next appointment:   12 month(s)  The format for your next appointment:   In Person  Provider:   You may see Sherren Mocha, MD or one of the following Advanced Practice Providers on your designated Care Team:    Richardson Dopp, PA-C  Robbie Lis, [place]    Other Instructions

## 2020-02-08 ENCOUNTER — Other Ambulatory Visit: Payer: Self-pay

## 2020-02-08 ENCOUNTER — Other Ambulatory Visit: Payer: Self-pay | Admitting: *Deleted

## 2020-02-08 MED ORDER — ATORVASTATIN CALCIUM 80 MG PO TABS: 80.0000 mg | ORAL_TABLET | Freq: Every day | ORAL | 0 refills | Status: DC

## 2020-02-08 MED ORDER — ATORVASTATIN CALCIUM 80 MG PO TABS: 80.0000 mg | ORAL_TABLET | Freq: Every day | ORAL | 1 refills | Status: DC

## 2020-02-10 ENCOUNTER — Other Ambulatory Visit: Payer: Self-pay

## 2020-02-10 DIAGNOSIS — M5416 Radiculopathy, lumbar region: Secondary | ICD-10-CM | POA: Diagnosis not present

## 2020-02-10 DIAGNOSIS — R030 Elevated blood-pressure reading, without diagnosis of hypertension: Secondary | ICD-10-CM | POA: Diagnosis not present

## 2020-02-10 DIAGNOSIS — M5136 Other intervertebral disc degeneration, lumbar region: Secondary | ICD-10-CM | POA: Diagnosis not present

## 2020-02-10 MED ORDER — NITROGLYCERIN 0.4 MG SL SUBL: 0.4000 mg | SUBLINGUAL_TABLET | SUBLINGUAL | 11 refills | Status: DC | PRN

## 2020-02-10 NOTE — Telephone Encounter (Signed)
Pt's medication was sent to pt's pharmacy as requested. Confirmation received.  °

## 2020-03-19 ENCOUNTER — Other Ambulatory Visit: Payer: Self-pay | Admitting: Family Medicine

## 2020-03-19 DIAGNOSIS — E032 Hypothyroidism due to medicaments and other exogenous substances: Secondary | ICD-10-CM

## 2020-03-19 DIAGNOSIS — E039 Hypothyroidism, unspecified: Secondary | ICD-10-CM

## 2020-03-19 DIAGNOSIS — E038 Other specified hypothyroidism: Secondary | ICD-10-CM

## 2020-03-19 DIAGNOSIS — J302 Other seasonal allergic rhinitis: Secondary | ICD-10-CM

## 2020-03-19 DIAGNOSIS — Z23 Encounter for immunization: Secondary | ICD-10-CM

## 2020-03-19 DIAGNOSIS — L820 Inflamed seborrheic keratosis: Secondary | ICD-10-CM

## 2020-03-23 ENCOUNTER — Other Ambulatory Visit: Payer: Self-pay | Admitting: *Deleted

## 2020-03-23 ENCOUNTER — Telehealth: Payer: Self-pay | Admitting: Family Medicine

## 2020-03-23 NOTE — Telephone Encounter (Signed)
Patient notified to schedule appt

## 2020-03-23 NOTE — Telephone Encounter (Signed)
Patient called requesting lab work, from his previous appointment. 6 month recheck is stated in the AVS from 09/20/19. Can we put orders in so I can call and schedule lab work with PT. Thank you!

## 2020-03-24 NOTE — Telephone Encounter (Signed)
There is no orders to schedule this

## 2020-03-24 NOTE — Telephone Encounter (Signed)
Appointment schedule  

## 2020-03-24 NOTE — Telephone Encounter (Signed)
Please schedule appointment with PCP for labs

## 2020-03-28 ENCOUNTER — Encounter: Payer: Self-pay | Admitting: Family Medicine

## 2020-03-28 ENCOUNTER — Other Ambulatory Visit: Payer: Self-pay

## 2020-03-28 ENCOUNTER — Ambulatory Visit (INDEPENDENT_AMBULATORY_CARE_PROVIDER_SITE_OTHER): Payer: Medicare HMO | Admitting: Family Medicine

## 2020-03-28 VITALS — BP 124/76 | HR 53 | Temp 97.7°F | Ht 71.0 in | Wt 208.0 lb

## 2020-03-28 DIAGNOSIS — E7800 Pure hypercholesterolemia, unspecified: Secondary | ICD-10-CM | POA: Diagnosis not present

## 2020-03-28 DIAGNOSIS — E039 Hypothyroidism, unspecified: Secondary | ICD-10-CM | POA: Diagnosis not present

## 2020-03-28 NOTE — Progress Notes (Signed)
Patient here today for lab visit.  He was mistakenly placed on my schedule.  He was not seen by or evaluated by me today.  Algis Greenhouse. [name], MD 03/28/2020 10:48 AM

## 2020-03-29 LAB — COMPREHENSIVE METABOLIC PANEL
AG Ratio: 1.9 (calc) (ref 1.0–2.5)
ALT: 25 U/L (ref 9–46)
AST: 16 U/L (ref 10–35)
Albumin: 3.8 g/dL (ref 3.6–5.1)
Alkaline phosphatase (APISO): 86 U/L (ref 35–144)
BUN: 17 mg/dL (ref 7–25)
CO2: 27 mmol/L (ref 20–32)
Calcium: 9.3 mg/dL (ref 8.6–10.3)
Chloride: 103 mmol/L (ref 98–110)
Creat: 0.9 mg/dL (ref 0.70–1.18)
Globulin: 2 g/dL (calc) (ref 1.9–3.7)
Glucose, Bld: 88 mg/dL (ref 65–99)
Potassium: 4.3 mmol/L (ref 3.5–5.3)
Sodium: 138 mmol/L (ref 135–146)
Total Bilirubin: 0.9 mg/dL (ref 0.2–1.2)
Total Protein: 5.8 g/dL — ABNORMAL LOW (ref 6.1–8.1)

## 2020-03-29 LAB — CBC
HCT: 42.9 % (ref 38.5–50.0)
Hemoglobin: 14.1 g/dL (ref 13.2–17.1)
MCH: 32 pg (ref 27.0–33.0)
MCHC: 32.9 g/dL (ref 32.0–36.0)
MCV: 97.3 fL (ref 80.0–100.0)
MPV: 10.8 fL (ref 7.5–12.5)
Platelets: 275 10*3/uL (ref 140–400)
RBC: 4.41 10*6/uL (ref 4.20–5.80)
RDW: 12.1 % (ref 11.0–15.0)
WBC: 7.4 10*3/uL (ref 3.8–10.8)

## 2020-03-29 LAB — LIPID PANEL
Cholesterol: 124 mg/dL (ref <200)
HDL: 48 mg/dL (ref >=40)
LDL Cholesterol (Calc): 62 mg/dL (calc)
Non-HDL Cholesterol (Calc): 76 mg/dL (calc) (ref <130)
Total CHOL/HDL Ratio: 2.6 (calc) (ref <5.0)
Triglycerides: 65 mg/dL (ref <150)

## 2020-03-29 LAB — TSH: TSH: 3.11 mIU/L (ref 0.40–4.50)

## 2020-03-29 NOTE — Progress Notes (Signed)
Please inform patient of the following:  Blood work all STABLE. We can recheck again in 6 months.  Ryan [name]. [name], MD 03/29/2020 8:08 AM

## 2020-04-13 DIAGNOSIS — R69 Illness, unspecified: Secondary | ICD-10-CM | POA: Diagnosis not present

## 2020-05-10 DIAGNOSIS — R69 Illness, unspecified: Secondary | ICD-10-CM | POA: Diagnosis not present

## 2020-06-12 ENCOUNTER — Other Ambulatory Visit: Payer: Self-pay | Admitting: Family Medicine

## 2020-06-12 DIAGNOSIS — E038 Other specified hypothyroidism: Secondary | ICD-10-CM

## 2020-06-12 DIAGNOSIS — E032 Hypothyroidism due to medicaments and other exogenous substances: Secondary | ICD-10-CM

## 2020-06-12 DIAGNOSIS — Z23 Encounter for immunization: Secondary | ICD-10-CM

## 2020-06-12 DIAGNOSIS — J302 Other seasonal allergic rhinitis: Secondary | ICD-10-CM

## 2020-06-12 DIAGNOSIS — L820 Inflamed seborrheic keratosis: Secondary | ICD-10-CM

## 2020-06-12 DIAGNOSIS — E039 Hypothyroidism, unspecified: Secondary | ICD-10-CM

## 2020-06-28 ENCOUNTER — Other Ambulatory Visit: Payer: Self-pay | Admitting: Pain Medicine

## 2020-06-28 DIAGNOSIS — M48061 Spinal stenosis, lumbar region without neurogenic claudication: Secondary | ICD-10-CM

## 2020-06-28 DIAGNOSIS — Z6829 Body mass index (BMI) 29.0-29.9, adult: Secondary | ICD-10-CM | POA: Diagnosis not present

## 2020-06-28 DIAGNOSIS — M47816 Spondylosis without myelopathy or radiculopathy, lumbar region: Secondary | ICD-10-CM | POA: Diagnosis not present

## 2020-06-28 DIAGNOSIS — R030 Elevated blood-pressure reading, without diagnosis of hypertension: Secondary | ICD-10-CM | POA: Diagnosis not present

## 2020-07-20 ENCOUNTER — Ambulatory Visit
Admission: RE | Admit: 2020-07-20 | Discharge: 2020-07-20 | Disposition: A | Discharge status: Home / Self Care | Payer: Medicare HMO | Source: Ambulatory Visit | Attending: Pain Medicine | Admitting: Pain Medicine

## 2020-07-20 ENCOUNTER — Other Ambulatory Visit: Payer: Self-pay

## 2020-07-20 ENCOUNTER — Other Ambulatory Visit: Payer: Self-pay | Admitting: Pain Medicine

## 2020-07-20 DIAGNOSIS — M48061 Spinal stenosis, lumbar region without neurogenic claudication: Secondary | ICD-10-CM

## 2020-07-20 DIAGNOSIS — M545 Low back pain, unspecified: Secondary | ICD-10-CM | POA: Diagnosis not present

## 2020-07-26 DIAGNOSIS — H02834 Dermatochalasis of left upper eyelid: Secondary | ICD-10-CM | POA: Diagnosis not present

## 2020-07-26 DIAGNOSIS — H02831 Dermatochalasis of right upper eyelid: Secondary | ICD-10-CM | POA: Diagnosis not present

## 2020-07-26 DIAGNOSIS — Z961 Presence of intraocular lens: Secondary | ICD-10-CM | POA: Diagnosis not present

## 2020-08-04 ENCOUNTER — Other Ambulatory Visit: Payer: Self-pay | Admitting: Family Medicine

## 2020-08-10 DIAGNOSIS — M47816 Spondylosis without myelopathy or radiculopathy, lumbar region: Secondary | ICD-10-CM | POA: Diagnosis not present

## 2020-08-10 DIAGNOSIS — M48061 Spinal stenosis, lumbar region without neurogenic claudication: Secondary | ICD-10-CM | POA: Diagnosis not present

## 2020-09-01 ENCOUNTER — Other Ambulatory Visit: Payer: Self-pay | Admitting: *Deleted

## 2020-09-01 ENCOUNTER — Telehealth: Payer: Self-pay | Admitting: Family Medicine

## 2020-09-01 MED ORDER — TAMSULOSIN HCL 0.4 MG PO CAPS: ORAL_CAPSULE | ORAL | 0 refills | Status: DC

## 2020-09-01 NOTE — Telephone Encounter (Signed)
Pt wife called in asking for a refill on the Tamsulosin, pt would like this sent to the [facility] on [address]

## 2020-09-01 NOTE — Telephone Encounter (Signed)
I sent this over 4 hours ago can you get someone to take care of this for the pt please.

## 2020-09-01 NOTE — Telephone Encounter (Signed)
This Rx was sent to the pharmacy on 09/01/20 at 11:58am.

## 2020-09-07 ENCOUNTER — Other Ambulatory Visit: Payer: Self-pay | Admitting: Family Medicine

## 2020-09-07 DIAGNOSIS — Z23 Encounter for immunization: Secondary | ICD-10-CM

## 2020-09-07 DIAGNOSIS — L820 Inflamed seborrheic keratosis: Secondary | ICD-10-CM

## 2020-09-07 DIAGNOSIS — E039 Hypothyroidism, unspecified: Secondary | ICD-10-CM

## 2020-09-07 DIAGNOSIS — E038 Other specified hypothyroidism: Secondary | ICD-10-CM

## 2020-09-07 DIAGNOSIS — E032 Hypothyroidism due to medicaments and other exogenous substances: Secondary | ICD-10-CM

## 2020-09-07 DIAGNOSIS — J302 Other seasonal allergic rhinitis: Secondary | ICD-10-CM

## 2020-09-20 ENCOUNTER — Encounter: Payer: Medicare HMO | Admitting: Family Medicine

## 2020-09-21 ENCOUNTER — Encounter: Payer: Medicare HMO | Admitting: Family Medicine

## 2020-10-02 ENCOUNTER — Ambulatory Visit (INDEPENDENT_AMBULATORY_CARE_PROVIDER_SITE_OTHER): Payer: Medicare Other | Admitting: Family Medicine

## 2020-10-02 ENCOUNTER — Other Ambulatory Visit: Payer: Self-pay

## 2020-10-02 ENCOUNTER — Encounter: Payer: Self-pay | Admitting: Family Medicine

## 2020-10-02 VITALS — BP 136/82 | HR 59 | Temp 97.6°F | Ht 71.0 in | Wt 210.8 lb

## 2020-10-02 DIAGNOSIS — Z79899 Other long term (current) drug therapy: Secondary | ICD-10-CM

## 2020-10-02 DIAGNOSIS — L820 Inflamed seborrheic keratosis: Secondary | ICD-10-CM

## 2020-10-02 DIAGNOSIS — E038 Other specified hypothyroidism: Secondary | ICD-10-CM

## 2020-10-02 DIAGNOSIS — N401 Enlarged prostate with lower urinary tract symptoms: Secondary | ICD-10-CM | POA: Diagnosis not present

## 2020-10-02 DIAGNOSIS — J302 Other seasonal allergic rhinitis: Secondary | ICD-10-CM

## 2020-10-02 DIAGNOSIS — E039 Hypothyroidism, unspecified: Secondary | ICD-10-CM | POA: Diagnosis not present

## 2020-10-02 DIAGNOSIS — E7800 Pure hypercholesterolemia, unspecified: Secondary | ICD-10-CM

## 2020-10-02 DIAGNOSIS — R739 Hyperglycemia, unspecified: Secondary | ICD-10-CM

## 2020-10-02 DIAGNOSIS — K219 Gastro-esophageal reflux disease without esophagitis: Secondary | ICD-10-CM

## 2020-10-02 DIAGNOSIS — E032 Hypothyroidism due to medicaments and other exogenous substances: Secondary | ICD-10-CM | POA: Diagnosis not present

## 2020-10-02 DIAGNOSIS — R351 Nocturia: Secondary | ICD-10-CM | POA: Diagnosis not present

## 2020-10-02 DIAGNOSIS — J4520 Mild intermittent asthma, uncomplicated: Secondary | ICD-10-CM

## 2020-10-02 DIAGNOSIS — M48062 Spinal stenosis, lumbar region with neurogenic claudication: Secondary | ICD-10-CM | POA: Diagnosis not present

## 2020-10-02 DIAGNOSIS — I2510 Atherosclerotic heart disease of native coronary artery without angina pectoris: Secondary | ICD-10-CM

## 2020-10-02 LAB — CBC
HCT: 45.5 % (ref 39.0–52.0)
Hemoglobin: 15.3 g/dL (ref 13.0–17.0)
MCHC: 33.5 g/dL (ref 30.0–36.0)
MCV: 95.2 fl (ref 78.0–100.0)
Platelets: 271 10*3/uL (ref 150.0–400.0)
RBC: 4.78 Mil/uL (ref 4.22–5.81)
RDW: 13.6 % (ref 11.5–15.5)
WBC: 9.3 10*3/uL (ref 4.0–10.5)

## 2020-10-02 LAB — COMPREHENSIVE METABOLIC PANEL
ALT: 38 U/L (ref 0–53)
AST: 16 U/L (ref 0–37)
Albumin: 4 g/dL (ref 3.5–5.2)
Alkaline Phosphatase: 67 U/L (ref 39–117)
BUN: 25 mg/dL — ABNORMAL HIGH (ref 6–23)
CO2: 30 mEq/L (ref 19–32)
Calcium: 10.1 mg/dL (ref 8.4–10.5)
Chloride: 102 mEq/L (ref 96–112)
Creatinine, Ser: 0.92 mg/dL (ref 0.40–1.50)
GFR: 81.1 mL/min (ref >60.00)
Glucose, Bld: 91 mg/dL (ref 70–99)
Potassium: 5.3 mEq/L — ABNORMAL HIGH (ref 3.5–5.1)
Sodium: 139 mEq/L (ref 135–145)
Total Bilirubin: 0.8 mg/dL (ref 0.2–1.2)
Total Protein: 6.2 g/dL (ref 6.0–8.3)

## 2020-10-02 LAB — LIPID PANEL
Cholesterol: 134 mg/dL (ref 0–200)
HDL: 52 mg/dL (ref >39.00)
LDL Cholesterol: 69 mg/dL (ref 0–99)
NonHDL: 81.62
Total CHOL/HDL Ratio: 3
Triglycerides: 61 mg/dL (ref 0.0–149.0)
VLDL: 12.2 mg/dL (ref 0.0–40.0)

## 2020-10-02 LAB — PSA: PSA: 2.42 ng/mL (ref 0.10–4.00)

## 2020-10-02 LAB — VITAMIN B12: Vitamin B-12: 366 pg/mL (ref 211–911)

## 2020-10-02 LAB — TSH: TSH: 2.69 u[IU]/mL (ref 0.35–4.50)

## 2020-10-02 LAB — HEMOGLOBIN A1C: Hgb A1c MFr Bld: 6.1 % (ref 4.6–6.5)

## 2020-10-02 MED ORDER — SYNTHROID 88 MCG PO TABS: 88.0000 ug | ORAL_TABLET | Freq: Every day | ORAL | 3 refills | Status: DC

## 2020-10-02 MED ORDER — PANTOPRAZOLE SODIUM 20 MG PO TBEC: 20.0000 mg | DELAYED_RELEASE_TABLET | Freq: Every day | ORAL | 3 refills | Status: DC

## 2020-10-02 NOTE — Assessment & Plan Note (Signed)
Refill Protonix.  Will check B12.

## 2020-10-02 NOTE — Assessment & Plan Note (Signed)
On aspirin and statin.  Follows with cardiology. 

## 2020-10-02 NOTE — Progress Notes (Signed)
Ryan [name] is a 75 y.o. male who presents today for an office visit.  Assessment/Plan:  New/Acute Problems: Renal Cyst Incidentally found on MRI few months ago.  Given single small benign-appearing cyst do not need to do further investigation at this point.  Reassured patient.  Chronic Problems Addressed Today: Hypothyroidism Check TSH.  Continue Synthroid 88 mcg daily.  BPH associated with nocturia Continue Flomax 0.8 mg daily.  Will check PSA today.  Asthma Stable.  Continue albuterol as needed.  GERD (gastroesophageal reflux disease) Refill Protonix.  Will check B12.  Spinal stenosis of lumbar region with neurogenic claudication Follows with orthopedics.    Hypercholesterolemia Check labs.  He is on Lipitor 80 mg daily.   CAD (coronary artery disease) On aspirin and statin.  Follows with cardiology.  Preventative Healthcare Check labs today. UTD on vaccines. No longer needs colon cancer screening due to age.    Subjective:  HPI:  See A/P for status of chronic conditions.  He would like to have blood work done today.  He has lost about 10 lbs since last year.    PMH:  The following were reviewed and entered/updated in epic: Past Medical History:  Diagnosis Date  . Allergy    seasonal  . Anxiety   . Arthritis   . Asthma   . Cataract   . Coronary artery disease 12/08/2016   STEMI, DES Circumflex  . GERD (gastroesophageal reflux disease)   . Hyperlipidemia   . Hypothyroidism   . STEMI (ST elevation myocardial infarction) ([place]) 12/08/2016   Patient Active Problem List   Diagnosis Date Noted  . GERD (gastroesophageal reflux disease) 09/14/2018  . Asthma 09/14/2018  . Spinal stenosis of lumbar region with neurogenic claudication 08/13/2017  . CAD (coronary artery disease) 12/17/2016  . Hypercholesterolemia 12/10/2016  . History of skin cancer 08/28/2016  . BPH associated with nocturia 05/17/2014  . Hypothyroidism 03/11/2007   Past Surgical  History:  Procedure Laterality Date  . cataract Bilateral 03/2016  . COLONOSCOPY  10.27.2005  . COLONOSCOPY  06/30/2018  . epidural steroid shot     x 3 , lumbar spine area  . LEFT HEART CATH AND CORONARY ANGIOGRAPHY N/A 12/08/2016   Procedure: Left Heart Cath and Coronary Angiography;  Surgeon: Sherren Mocha, MD;  Location: [place] [facility];  Service: Cardiovascular;  Laterality: N/A;  . POLYPECTOMY    . SHOULDER SURGERY Right    Rotator Cuff Tear Repair  . TONSILLECTOMY    . UPPER GASTROINTESTINAL ENDOSCOPY      Family History  Problem Relation Age of Onset  . Diabetes Mother   . Heart failure Mother   . Irritable bowel syndrome Mother   . Diverticulosis Mother   . Lung cancer Father        worked in [place]  . Heart disease Sister   . Irritable bowel syndrome Sister   . Diverticulosis Sister   . Colon cancer Neg Hx   . Esophageal cancer Neg Hx   . Pancreatic cancer Neg Hx   . Stomach cancer Neg Hx     Medications- reviewed and updated Current Outpatient Medications  Medication Sig Dispense Refill  . acetaminophen (TYLENOL) 325 MG tablet Take 325-650 mg by mouth every 6 (six) hours as needed for mild pain or moderate pain.    Marland Kitchen albuterol (PROVENTIL HFA;VENTOLIN HFA) 108 (90 Base) MCG/ACT inhaler Inhale 2 puffs into the lungs every 6 (six) hours as needed for wheezing. 1 Inhaler 1  .  aspirin EC 81 MG EC tablet Take 1 tablet (81 mg total) by mouth daily. 30 tablet 11  . atorvastatin (LIPITOR) 80 MG tablet TAKE 1 TABLET BY MOUTH EVERY DAY 90 tablet 1  . bisacodyl (DULCOLAX) 5 MG EC tablet Take 5 mg by mouth daily as needed for moderate constipation.    Marland Kitchen co-enzyme Q-10 30 MG capsule Take 30 mg by mouth daily.     . fluorouracil (EFUDEX) 5 % cream Apply 1 application topically as needed.     Marland Kitchen HYDROcodone-homatropine (HYCODAN) 5-1.5 MG/5ML syrup Take 5 mLs by mouth every 8 (eight) hours as needed for cough. 120 mL 0  . Melatonin 5 MG TABS Take 5 mg by mouth at  bedtime as needed (sleep).    . Multiple Vitamins-Minerals (CENTRUM ADULTS PO) Take 1 tablet by mouth daily.    . nitroGLYCERIN (NITROSTAT) 0.4 MG SL tablet Place 1 tablet (0.4 mg total) under the tongue every 5 (five) minutes as needed for chest pain. 25 tablet 11  . pantoprazole (PROTONIX) 20 MG tablet Take 1 tablet (20 mg total) by mouth daily. 90 tablet 3  . polyethylene glycol powder (GLYCOLAX/MIRALAX) powder Take 17 g by mouth as needed for mild constipation.     Marland Kitchen SYNTHROID 88 MCG tablet Take 1 tablet (88 mcg total) by mouth daily. 90 tablet 3  . tamsulosin (FLOMAX) 0.4 MG CAPS capsule TAKE 2 CAPSULES(0.8 MG) BY MOUTH DAILY 180 capsule 0   No current facility-administered medications for this visit.    Allergies-reviewed and updated Allergies  Allergen Reactions  . Contrast Media [Iodinated Diagnostic Agents] Shortness Of Breath and Swelling    Swelling of the throat   . Erythromycin Nausea And Vomiting  . Niacin And Related Swelling  . Penicillins Hives    Has patient had a PCN reaction causing immediate rash, facial/tongue/throat swelling, SOB or lightheadedness with hypotension: Y Has patient had a PCN reaction causing severe rash involving mucus membranes or skin necrosis: No Has patient had a PCN reaction that required hospitalization: No Has patient had a PCN reaction occurring within the last 10 years: No If all of the above answers are "NO", then may proceed with Cephalosporin use.    Social History   Socioeconomic History  . Marital status: Married    Spouse name: Not on file  . Number of children: 2  . Years of education: Not on file  . Highest education level: Not on file  Occupational History  . Occupation: retired  Tobacco Use  . Smoking status: Never Smoker  . Smokeless tobacco: Never Used  Vaping Use  . Vaping Use: Never used  Substance and Sexual Activity  . Alcohol use: Yes    Comment: occasional  . Drug use: No  . Sexual activity: Not on file   Other Topics Concern  . Not on file  Social History Narrative  . Not on file   Social Determinants of Health   Financial Resource Strain: Not on file  Food Insecurity: Not on file  Transportation Needs: Not on file  Physical Activity: Not on file  Stress: Not on file  Social Connections: Not on file          Objective:  Physical Exam: BP 136/82   Pulse (!) 59   Temp 97.6 F (36.4 C) (Temporal)   Ht 5\' 11"  (1.803 m)   Wt 210 lb 12.8 oz (95.6 kg)   SpO2 97%   BMI 29.40 kg/m   Wt Readings from Last  3 Encounters:  10/02/20 210 lb 12.8 oz (95.6 kg)  03/28/20 208 lb (94.3 kg)  01/07/20 211 lb (95.7 kg)    Gen: No acute distress, resting comfortably CV: Regular rate and rhythm with no murmurs appreciated Pulm: Normal work of breathing, clear to auscultation bilaterally with no crackles, wheezes, or rhonchi Neuro: Grossly normal, moves all extremities Psych: Normal affect and thought content  Time Spent: 45 minutes of total time was spent on the date of the encounter performing the following actions: chart review prior to seeing the patient, obtaining history, reviewing recent imaging and lab results in detail with patient, performing a medically necessary exam, counseling on the treatment plan, placing orders, and documenting in our EHR.        Algis Greenhouse. [name], MD 10/02/2020 9:15 AM

## 2020-10-02 NOTE — Patient Instructions (Signed)
It was very nice to see you today!  We will check blood work today and I will refill your medications.  I will see you back in 1 year for your next annual checkup.  Please come back to see me sooner if needed.  Take care, Dr [name]  Please try these tips to maintain a healthy lifestyle:   Eat at least 3 REAL meals and 1-2 snacks per day.  Aim for no more than 5 hours between eating.  If you eat breakfast, please do so within one hour of getting up.    Each meal should contain half fruits/vegetables, one quarter protein, and one quarter carbs (no bigger than a computer mouse)   Cut down on sweet beverages. This includes juice, soda, and sweet tea.     Drink at least 1 glass of water with each meal and aim for at least 8 glasses per day   Exercise at least 150 minutes every week.    Preventive Care 36 Years and Older, Male Preventive care refers to lifestyle choices and visits with your health care provider that can promote health and wellness. This includes:  A yearly physical exam. This is also called an annual wellness visit.  Regular dental and eye exams.  Immunizations.  Screening for certain conditions.  Healthy lifestyle choices, such as: ? Eating a healthy diet. ? Getting regular exercise. ? Not using drugs or products that contain nicotine and tobacco. ? Limiting alcohol use. What can I expect for my preventive care visit? Physical exam Your health care provider will check your:  Height and weight. These may be used to calculate your BMI (body mass index). BMI is a measurement that tells if you are at a healthy weight.  Heart rate and blood pressure.  Body temperature.  Skin for abnormal spots. Counseling Your health care provider may ask you questions about your:  Past medical problems.  Family's medical history.  Alcohol, tobacco, and drug use.  Emotional well-being.  Home life and relationship well-being.  Sexual activity.  Diet,  exercise, and sleep habits.  History of falls.  Memory and ability to understand (cognition).  Work and work Statistician.  Access to firearms. What immunizations do I need? Vaccines are usually given at various ages, according to a schedule. Your health care provider will recommend vaccines for you based on your age, medical history, and lifestyle or other factors, such as travel or where you work.   What tests do I need? Blood tests  Lipid and cholesterol levels. These may be checked every 5 years, or more often depending on your overall health.  Hepatitis C test.  Hepatitis B test. Screening  Lung cancer screening. You may have this screening every year starting at age 63 if you have a 30-pack-year history of smoking and currently smoke or have quit within the past 15 years.  Colorectal cancer screening. ? All adults should have this screening starting at age 39 and continuing until age 59. ? Your health care provider may recommend screening at age 73 if you are at increased risk. ? You will have tests every 1-10 years, depending on your results and the type of screening test.  Prostate cancer screening. Recommendations will vary depending on your family history and other risks.  Genital exam to check for testicular cancer or hernias.  Diabetes screening. ? This is done by checking your blood sugar (glucose) after you have not eaten for a while (fasting). ? You may have this done every  1-3 years.  Abdominal aortic aneurysm (AAA) screening. You may need this if you are a current or former smoker.  STD (sexually transmitted disease) testing, if you are at risk. Follow these instructions at home: Eating and drinking  Eat a diet that includes fresh fruits and vegetables, whole grains, lean protein, and low-fat dairy products. Limit your intake of foods with high amounts of sugar, saturated fats, and salt.  Take vitamin and mineral supplements as recommended by your health  care provider.  Do not drink alcohol if your health care provider tells you not to drink.  If you drink alcohol: ? Limit how much you have to 0-2 drinks a day. ? Be aware of how much alcohol is in your drink. In the [place] one drink equals one 12 oz bottle of beer (355 mL), one 5 oz glass of wine (148 mL), or one 1 oz glass of hard liquor (44 mL).   Lifestyle  Take daily care of your teeth and gums. Brush your teeth every morning and night with fluoride toothpaste. Floss one time each day.  Stay active. Exercise for at least 30 minutes 5 or more days each week.  Do not use any products that contain nicotine or tobacco, such as cigarettes, e-cigarettes, and chewing tobacco. If you need help quitting, ask your health care provider.  Do not use drugs.  If you are sexually active, practice safe sex. Use a condom or other form of protection to prevent STIs (sexually transmitted infections).  Talk with your health care provider about taking a low-dose aspirin or statin.  Find healthy ways to cope with stress, such as: ? Meditation, yoga, or listening to music. ? Journaling. ? Talking to a trusted person. ? Spending time with friends and family. Safety  Always wear your seat belt while driving or riding in a vehicle.  Do not drive: ? If you have been drinking alcohol. Do not ride with someone who has been drinking. ? When you are tired or distracted. ? While texting.  Wear a helmet and other protective equipment during sports activities.  If you have firearms in your house, make sure you follow all gun safety procedures. What's next?  Visit your health care provider once a year for an annual wellness visit.  Ask your health care provider how often you should have your eyes and teeth checked.  Stay up to date on all vaccines. This information is not intended to replace advice given to you by your health care provider. Make sure you discuss any questions you have with your health  care provider. Document Revised: 04/20/2019 Document Reviewed: 07/16/2018 Elsevier Patient Education  2021 [employer].

## 2020-10-02 NOTE — Assessment & Plan Note (Signed)
Check labs.  He is on Lipitor 80 mg daily. 

## 2020-10-02 NOTE — Assessment & Plan Note (Signed)
Stable. Continue albuterol as needed.  

## 2020-10-02 NOTE — Assessment & Plan Note (Signed)
Follows with orthopedics. 

## 2020-10-02 NOTE — Assessment & Plan Note (Signed)
Continue Flomax 0.8 mg daily.  Will check PSA today.

## 2020-10-02 NOTE — Assessment & Plan Note (Signed)
Check TSH.  Continue Synthroid 88 mcg daily. 

## 2020-10-03 NOTE — Progress Notes (Signed)
Please inform patient of the following:  Labs are all stable compared to last year. Do not need to make any changes to his treatment plan at this time. He can come back in 6 months for labs if he wishes but we will see him back in 1 year for his next check up.  Ryan [name]. [name], MD 10/03/2020 8:43 AM

## 2020-11-27 ENCOUNTER — Other Ambulatory Visit: Payer: Self-pay | Admitting: Family Medicine

## 2021-01-11 ENCOUNTER — Telehealth: Payer: Self-pay | Admitting: Family Medicine

## 2021-01-11 NOTE — Chronic Care Management (AMB) (Signed)
  Chronic Care Management   Note  01/11/2021 Name: Ryan [name] MRN: [id number] DOB: Jun 15, 1946  Ryan [name] is a 75 y.o. year old male who is a primary care patient of Vivi Barrack, MD. I reached out to Susa Loffler by phone today in response to a referral sent by [name]'s PCP, Vivi Barrack, MD.   Mr. Stump was given information about Chronic Care Management services today including:  CCM service includes personalized support from designated clinical staff supervised by his physician, including individualized plan of care and coordination with other care providers 24/7 contact phone numbers for assistance for urgent and routine care needs. Service will only be billed when office clinical staff spend 20 minutes or more in a month to coordinate care. Only one practitioner may furnish and bill the service in a calendar month. The patient may stop CCM services at any time (effective at the end of the month) by phone call to the office staff.   Patient agreed to services and verbal consent obtained.   Follow up plan:   Lauretta Grill [employer] Scheduler

## 2021-02-03 ENCOUNTER — Other Ambulatory Visit: Payer: Self-pay | Admitting: Family Medicine

## 2021-02-15 ENCOUNTER — Other Ambulatory Visit: Payer: Self-pay | Admitting: Cardiovascular Disease

## 2021-02-21 ENCOUNTER — Telehealth: Payer: Self-pay | Admitting: Pharmacist

## 2021-02-21 NOTE — Chronic Care Management (AMB) (Addendum)
Chronic Care Management Pharmacy Assistant   Name: Hazem Kenner  MRN: [id number] DOB: [birth year]  Ryan [name] is an 75 y.o. year old male who presents for his initial CCM visit with the clinical pharmacist.  Reason for Encounter: Initial CCM Visit   Conditions to be addressed/monitored: CAD, Asthma, GERD, Hypothyroidism, BPH, HLD, Spinal Stenosis  Primary concerns for visit include: CAD, Hypothyroidism, HLD, GERD, Spinal Stenosis, Asthma   Recent office visits:  10/02/2020 [facility] PCP Vivi Barrack, MD; stable chronic f/u, no medication changes indicated.  Recent consult visits:  None  Hospital visits:  None in previous 6 months  Medications: Outpatient Encounter Medications as of 02/21/2021  Medication Sig   acetaminophen (TYLENOL) 325 MG tablet Take 325-650 mg by mouth every 6 (six) hours as needed for mild pain or moderate pain.   albuterol (PROVENTIL HFA;VENTOLIN HFA) 108 (90 Base) MCG/ACT inhaler Inhale 2 puffs into the lungs every 6 (six) hours as needed for wheezing.   aspirin EC 81 MG EC tablet Take 1 tablet (81 mg total) by mouth daily.   atorvastatin (LIPITOR) 80 MG tablet TAKE 1 TABLET BY MOUTH EVERY DAY   bisacodyl (DULCOLAX) 5 MG EC tablet Take 5 mg by mouth daily as needed for moderate constipation.   co-enzyme Q-10 30 MG capsule Take 30 mg by mouth daily.    fluorouracil (EFUDEX) 5 % cream Apply 1 application topically as needed.    HYDROcodone-homatropine (HYCODAN) 5-1.5 MG/5ML syrup Take 5 mLs by mouth every 8 (eight) hours as needed for cough.   Melatonin 5 MG TABS Take 5 mg by mouth at bedtime as needed (sleep).   Multiple Vitamins-Minerals (CENTRUM ADULTS PO) Take 1 tablet by mouth daily.   nitroGLYCERIN (NITROSTAT) 0.4 MG SL tablet Place 1 tablet (0.4 mg total) under the tongue every 5 (five) minutes as needed for chest pain. Please make overdue appt with Dr. Burt Knack before anymore refills. Thank you 1st attempt   pantoprazole (PROTONIX) 20 MG  tablet Take 1 tablet (20 mg total) by mouth daily.   polyethylene glycol powder (GLYCOLAX/MIRALAX) powder Take 17 g by mouth as needed for mild constipation.    SYNTHROID 88 MCG tablet Take 1 tablet (88 mcg total) by mouth daily.   tamsulosin (FLOMAX) 0.4 MG CAPS capsule TAKE 2 CAPSULES BY MOUTH EVERY DAY.   No facility-administered encounter medications on file as of 02/21/2021.    Current Medications: Nitroglycerin 0.4 mg SL - as needed for chest pain  Atorvastatin 80 mg once daily Tamsulosin 0.4 mg two capsules daily Synthroid 88 mcg once daily Pantoprazole 20 mg once daily Multiple Vitamin Hydrocodone-homatrophine  Fluorouracil 5% cream - use as needed for skin cancer Co-enzyme Q-10 30 mg - once a day Polyethylene glycol - uses as needed/daily Melatonin 5 mg - one at bedtime as needed  Patient states he has recently had all of his medications filled within the last 2-3 months at [facility] on [address] in [place]. These filled dates are not available under medication dispense history.   Any changes in your medications or health? Patient states he has not had any changes in his medications or health.  Any side effects from any medications?  Patient states he has not had any side effects from any of his medications that he is aware of.  Do you have any symptoms or problems not managed by your medications? Patient states he has a lot of back pain. He states he don't take medication for it but  he does get routine steroid injections to help with pain.  Any concerns about your health right now? Patient states he does not have any major concerns with his health right now.  Has your provider asked that you check blood pressure, blood sugar, or follow special diet at home? Patient states he does check blood pressure, but not blood sugar. He states he has not ever had a problem with blood sugar. He states he does not follow a special diet at home.  Do you get any type of exercise on a  regular basis? Patient states he like to walk for exercise but is limited due to his back pain. Patient states he has trouble standing for long periods of time, he will have to stop and sit for a while before restarting. He also likes to golf regularly. He states it doesn't hurt his back to swing, only when he walks.  Can you think of a goal you would like to reach for your health? Patient states he would like to use a few pounds.  Do you have any problems getting your medications? Patient states he does not have any problems getting any of his medications.  Is there anything that you would like to discuss during the appointment?  Patient states he doesn't have anything he would like to discuss.  Please bring medications and supplements to appointment  Future Appointments  Date Time Provider [place]ra[place]  02/26/2021  9:00 AM [facility] CCM PHARMACIST [facility]      Star Rating Drugs: Atorvastatin 80 mg once daily  April D Calhoun, [place] Pharmacist Assistant [phone]

## 2021-02-26 ENCOUNTER — Other Ambulatory Visit: Payer: Self-pay

## 2021-02-26 ENCOUNTER — Ambulatory Visit (INDEPENDENT_AMBULATORY_CARE_PROVIDER_SITE_OTHER): Payer: Medicare Other | Admitting: Pharmacist

## 2021-02-26 ENCOUNTER — Other Ambulatory Visit: Payer: Self-pay | Admitting: *Deleted

## 2021-02-26 ENCOUNTER — Other Ambulatory Visit (INDEPENDENT_AMBULATORY_CARE_PROVIDER_SITE_OTHER): Payer: Medicare Other

## 2021-02-26 DIAGNOSIS — I2510 Atherosclerotic heart disease of native coronary artery without angina pectoris: Secondary | ICD-10-CM

## 2021-02-26 DIAGNOSIS — K219 Gastro-esophageal reflux disease without esophagitis: Secondary | ICD-10-CM

## 2021-02-26 DIAGNOSIS — E7800 Pure hypercholesterolemia, unspecified: Secondary | ICD-10-CM

## 2021-02-26 DIAGNOSIS — C61 Malignant neoplasm of prostate: Secondary | ICD-10-CM | POA: Diagnosis not present

## 2021-02-26 DIAGNOSIS — R739 Hyperglycemia, unspecified: Secondary | ICD-10-CM

## 2021-02-26 DIAGNOSIS — E039 Hypothyroidism, unspecified: Secondary | ICD-10-CM

## 2021-02-26 DIAGNOSIS — E538 Deficiency of other specified B group vitamins: Secondary | ICD-10-CM

## 2021-02-26 DIAGNOSIS — E032 Hypothyroidism due to medicaments and other exogenous substances: Secondary | ICD-10-CM

## 2021-02-26 LAB — COMPREHENSIVE METABOLIC PANEL
ALT: 26 U/L (ref 0–53)
AST: 17 U/L (ref 0–37)
Albumin: 3.8 g/dL (ref 3.5–5.2)
Alkaline Phosphatase: 84 U/L (ref 39–117)
BUN: 17 mg/dL (ref 6–23)
CO2: 29 mEq/L (ref 19–32)
Calcium: 9.3 mg/dL (ref 8.4–10.5)
Chloride: 105 mEq/L (ref 96–112)
Creatinine, Ser: 0.86 mg/dL (ref 0.40–1.50)
GFR: 84.18 mL/min (ref >60.00)
Glucose, Bld: 88 mg/dL (ref 70–99)
Potassium: 5 mEq/L (ref 3.5–5.1)
Sodium: 140 mEq/L (ref 135–145)
Total Bilirubin: 0.8 mg/dL (ref 0.2–1.2)
Total Protein: 5.8 g/dL — ABNORMAL LOW (ref 6.0–8.3)

## 2021-02-26 LAB — PSA: PSA: 2.73 ng/mL (ref 0.10–4.00)

## 2021-02-26 LAB — CBC
HCT: 42.2 % (ref 39.0–52.0)
Hemoglobin: 14 g/dL (ref 13.0–17.0)
MCHC: 33.2 g/dL (ref 30.0–36.0)
MCV: 95.7 fl (ref 78.0–100.0)
Platelets: 238 10*3/uL (ref 150.0–400.0)
RBC: 4.41 Mil/uL (ref 4.22–5.81)
RDW: 12.6 % (ref 11.5–15.5)
WBC: 5.4 10*3/uL (ref 4.0–10.5)

## 2021-02-26 LAB — TSH: TSH: 4.21 u[IU]/mL (ref 0.35–5.50)

## 2021-02-26 LAB — HEMOGLOBIN A1C: Hgb A1c MFr Bld: 5.9 % (ref 4.6–6.5)

## 2021-02-26 LAB — VITAMIN B12: Vitamin B-12: 333 pg/mL (ref 211–911)

## 2021-02-26 NOTE — Patient Instructions (Addendum)
Visit Information   Goals Addressed             This Visit's Progress    Keep Low Back Pain Under Control       Timeframe:  Long-Range Goal Priority:  High Start Date: 02/26/21                            Expected End Date: 08/29/21                      Follow Up Date 06/04/21    - call for medicine refill 2 or 3 days before it runs out - develop a personal pain management plan - plan exercise or activity when pain is best controlled - stay active - track times pain is worst and when it is best - track what makes the pain worse and what makes it better    Why is this important?   Day-to-day life can be hard when you have back pain.  Pain medicine is just one piece of the treatment puzzle. There are many things you can do to manage pain and keep your back strong.   Lifestyle changes, like stopping smoking and eating foods with Vitamin D and calcium, keep your bones and muscles healthy. Your back is better when it is supported by strong muscles.  You can try these action steps to help you manage your pain.     Notes:        Patient Care Plan: General Pharmacy (Adult)     Problem Identified: CAD, Asthma, GERD, Hypothyroidism, Hypercholesterolemia, Spinal Stenosis   Priority: High  Onset Date: 02/26/2021     Long-Range Goal: Patient-Specific Goal   Start Date: 02/26/2021  Expected End Date: 08/29/2021  This Visit's Progress: On track  Priority: High  Note:   Current Barriers:  Unable to achieve control of back pain   Pharmacist Clinical Goal(s):  Patient will achieve adherence to monitoring guidelines and medication adherence to achieve therapeutic efficacy adhere to prescribed medication regimen as evidenced by pill box contact provider office for questions/concerns as evidenced notation of same in electronic health record through collaboration with PharmD and provider.   Interventions: 1:1 collaboration with Vivi Barrack, MD regarding development and update of  comprehensive plan of care as evidenced by provider attestation and co-signature Inter-disciplinary care team collaboration (see longitudinal plan of care) Comprehensive medication review performed; medication list updated in electronic medical record  Hyperlipidemia/CAD: (LDL goal < 70) -Controlled -Current treatment: Atorvastatin '80mg'$  daily ASA '81mg'$  daily -Medications previously tried: none noted  -Current exercise habits: golfs, exercise is limited due to back pain -Educated on Cholesterol goals;  Benefits of statin for ASCVD risk reduction; Importance of limiting foods high in cholesterol; Strategies to manage statin-induced myalgias; -He is getting blood work today for updated lipid panel, most recent LDL well controlled  -He is adherent with meds and reports no myalgias -Counseled on diet and exercise extensively Recommended to continue current medication  GERD (Goal: Control Symptoms) -Controlled -Current treatment  Pantoprazole '20mg'$  daily - takes at night -Medications previously tried: OTC tums, etc. -Patient has severe symptoms when not taking this medication. -Takes appropriately at night because his symptoms used to wake him up.  -Recommended to continue current medication  Spinal Stenosis (Goal: Reduce pain/improve QOL) -Not ideally controlled -Current treatment  Tylenol prn Injections from neuro - steroid -Medications previously tried: none noted  -Has history of taking large  quantities of NSAIDs for relief.  Discussed other alternatives for pain relief such as Tramadol to take prn.  Kidney function is still stable.  He sees neuro for pain management. -Recommended to continue current medication Recommended he discuss possible tramadol prn with neuro at next visit.  Hypothyroidism (Goal: Maintain TSH) -Controlled -Current treatment  Synthroid 3mg daily -Medications previously tried: none noted -Takes appropriately on empty stomach 30-60 mins before food and  drink -TSH is WNL  -Recommended to continue current medication  Asthma (Goal: Minimze symptoms) -Controlled -Current treatment  Albuterol HFA 90 mcg daily -Medications previously tried: none noted -Patient rarely has to use inhaler  -Recommended to continue current medication  Patient Goals/Self-Care Activities Patient will:  - take medications as prescribed check blood pressure periodically, document, and provide at future appointments collaborate with provider on medication access solutions  Follow Up Plan: The care management team will reach out to the patient again over the next 180 days.      Mr. HValasekwas given information about Chronic Care Management services today including:  CCM service includes personalized support from designated clinical staff supervised by his physician, including individualized plan of care and coordination with other care providers 24/7 contact phone numbers for assistance for urgent and routine care needs. Standard insurance, coinsurance, copays and deductibles apply for chronic care management only during months in which we provide at least 20 minutes of these services. Most insurances cover these services at 100%, however patients may be responsible for any copay, coinsurance and/or deductible if applicable. This service may help you avoid the need for more expensive face-to-face services. Only one practitioner may furnish and bill the service in a calendar month. The patient may stop CCM services at any time (effective at the end of the month) by phone call to the office staff.  Patient agreed to services and verbal consent obtained.   The patient verbalized understanding of instructions, educational materials, and care plan provided today and agreed to receive a mailed copy of patient instructions, educational materials, and care plan.  Telephone follow up appointment with pharmacy team member scheduled for: 6 months  CEdythe Clarity RAdin

## 2021-02-26 NOTE — Progress Notes (Signed)
Chronic Care Management Pharmacy Note  02/26/2021 Name:  Ryan [name] MRN:  [id number] DOB:  [birth year]  Summary: Initial visit with PharmD.  Patient main concern is his ongoing pain.  Reviewed all meds and updated med list/  Recommendations/Changes made from today's visit: No med changes - discuss Tramadol with neuro  Plan: FU 6 months   Subjective: Ryan [name] is an 75 y.o. year old male who is a primary patient of Vivi Barrack, MD.  The CCM team was consulted for assistance with disease management and care coordination needs.    Engaged with patient face to face for initial visit in response to provider referral for pharmacy case management and/or care coordination services.   Consent to Services:  The patient was given the following information about Chronic Care Management services today, agreed to services, and gave verbal consent: 1. CCM service includes personalized support from designated clinical staff supervised by the primary care provider, including individualized plan of care and coordination with other care providers 2. 24/7 contact phone numbers for assistance for urgent and routine care needs. 3. Service will only be billed when office clinical staff spend 20 minutes or more in a month to coordinate care. 4. Only one practitioner may furnish and bill the service in a calendar month. 5.The patient may stop CCM services at any time (effective at the end of the month) by phone call to the office staff. 6. The patient will be responsible for cost sharing (co-pay) of up to 20% of the service fee (after annual deductible is met). Patient agreed to services and consent obtained.  Patient Care Team: Vivi Barrack, MD as PCP - General (Family Medicine) Sherren Mocha, MD as PCP - Cardiology (Cardiology) Edythe Clarity, [facility] as Pharmacist (Pharmacist)  Recent office visits:  10/02/2020 OV PCP Vivi Barrack, MD; stable chronic f/u, no medication changes  indicated.   Recent consult visits:  None   Hospital visits:  None in previous 6 months   Objective:  Lab Results  Component Value Date   CREATININE 0.92 10/02/2020   BUN 25 (H) 10/02/2020   GFR 81.10 10/02/2020   GFRNONAA >60 03/05/2017   GFRAA >60 03/05/2017   NA 139 10/02/2020   K 5.3 No hemolysis seen (H) 10/02/2020   CALCIUM 10.1 10/02/2020   CO2 30 10/02/2020   GLUCOSE 91 10/02/2020    Lab Results  Component Value Date/Time   HGBA1C 6.1 10/02/2020 09:11 AM   HGBA1C 5.9 09/20/2019 08:57 AM   GFR 81.10 10/02/2020 09:11 AM   GFR 79.22 09/20/2019 08:57 AM    Last diabetic Eye exam: No results found for: HMDIABEYEEXA  Last diabetic Foot exam: No results found for: HMDIABFOOTEX   Lab Results  Component Value Date   CHOL 134 10/02/2020   HDL 52.00 10/02/2020   LDLCALC 69 10/02/2020   LDLDIRECT 144.0 05/13/2013   TRIG 61.0 10/02/2020   CHOLHDL 3 10/02/2020    Hepatic Function Latest Ref Rng & Units 10/02/2020 03/28/2020 09/20/2019  Total Protein 6.0 - 8.3 g/dL 6.2 5.8(L) 6.2  Albumin 3.5 - 5.2 g/dL 4.0 - 4.1  AST 0 - 37 U/L 16 16 15   ALT 0 - 53 U/L 38 25 23  Alk Phosphatase 39 - 117 U/L 67 - 98  Total Bilirubin 0.2 - 1.2 mg/dL 0.8 0.9 0.6  Bilirubin, Direct 0.0 - 0.3 mg/dL - - -    Lab Results  Component Value Date/Time   TSH 2.69 10/02/2020 09:11  AM   TSH 3.11 03/28/2020 10:55 AM    CBC Latest Ref Rng & Units 10/02/2020 03/28/2020 09/20/2019  WBC 4.0 - 10.5 K/uL 9.3 7.4 5.9  Hemoglobin 13.0 - 17.0 g/dL 15.3 14.1 14.4  Hematocrit 39.0 - 52.0 % 45.5 42.9 42.8  Platelets 150.0 - 400.0 K/uL 271.0 275 245.0    No results found for: VD25OH  Clinical ASCVD: Yes  The 10-year ASCVD risk score Mikey Bussing DC Jr., et al., 2013) is: 29.3%   Values used to calculate the score:     Age: 13 years     Sex: Male     Is Non-Hispanic African American: No     Diabetic: No     Tobacco smoker: No     Systolic Blood Pressure: 272 mmHg     Is BP treated: Yes     HDL  Cholesterol: 52 mg/dL     Total Cholesterol: 134 mg/dL    Depression screen [facility] 2/9 09/20/2019 07/13/2018 05/05/2017  Decreased Interest 0 0 0  Down, Depressed, Hopeless 0 0 0  PHQ - 2 Score 0 0 0     Social History   Tobacco Use  Smoking Status Never  Smokeless Tobacco Never   BP Readings from Last 3 Encounters:  10/02/20 136/82  03/28/20 124/76  01/07/20 130/72   Pulse Readings from Last 3 Encounters:  10/02/20 (!) 59  03/28/20 (!) 53  01/07/20 (!) 49   Wt Readings from Last 3 Encounters:  10/02/20 210 lb 12.8 oz (95.6 kg)  03/28/20 208 lb (94.3 kg)  01/07/20 211 lb (95.7 kg)   BMI Readings from Last 3 Encounters:  10/02/20 29.40 kg/m  03/28/20 29.01 kg/m  01/07/20 29.43 kg/m    Assessment/Interventions: Review of patient past medical history, allergies, medications, health status, including review of consultants reports, laboratory and other test data, was performed as part of comprehensive evaluation and provision of chronic care management services.   SDOH:  (Social Determinants of Health) assessments and interventions performed: Yes  Financial Resource Strain: Low Risk    Difficulty of Paying Living Expenses: Not very hard    SDOH Screenings   Alcohol Screen: Not on file  Depression (PHQ2-9): Not on file  Financial Resource Strain: Low Risk    Difficulty of Paying Living Expenses: Not very hard  Food Insecurity: Not on file  Housing: Not on file  Physical Activity: Not on file  Social Connections: Not on file  Stress: Not on file  Tobacco Use: Low Risk    Smoking Tobacco Use: Never   Smokeless Tobacco Use: Never  Transportation Needs: Not on file    CCM Care Plan  Allergies  Allergen Reactions   Contrast Media [Iodinated Diagnostic Agents] Shortness Of Breath and Swelling    Swelling of the throat    Erythromycin Nausea And Vomiting   Niacin And Related Swelling   Penicillins Hives    Has patient had a PCN reaction causing immediate rash,  facial/tongue/throat swelling, SOB or lightheadedness with hypotension: Y Has patient had a PCN reaction causing severe rash involving mucus membranes or skin necrosis: No Has patient had a PCN reaction that required hospitalization: No Has patient had a PCN reaction occurring within the last 10 years: No If all of the above answers are "NO", then may proceed with Cephalosporin use.    Medications Reviewed Today     Reviewed by Edythe Clarity, [facility] (Pharmacist) on 02/26/21 at 57  Med List Status: <None>   Medication Order Taking?  Sig Documenting Provider Last Dose Status Informant  acetaminophen (TYLENOL) 325 MG tablet [id number] Yes Take 325-650 mg by mouth every 6 (six) hours as needed for mild pain or moderate pain. [provider] Taking Active Self  albuterol (PROVENTIL HFA;VENTOLIN HFA) 108 (90 Base) MCG/ACT inhaler [id number] Yes Inhale 2 puffs into the lungs every 6 (six) hours as needed for wheezing. Dorena Cookey, MD Taking Active Self  aspirin EC 81 MG EC tablet [id number] Yes Take 1 tablet (81 mg total) by mouth daily. Delos Haring, PA-C Taking Active Self  atorvastatin (LIPITOR) 80 MG tablet [id number] Yes TAKE 1 TABLET BY MOUTH EVERY DAY Vivi Barrack, MD Taking Active   bisacodyl (DULCOLAX) 5 MG EC tablet [id number] Yes Take 5 mg by mouth daily as needed for moderate constipation. [provider] Taking Active Self  co-enzyme Q-10 30 MG capsule [id number] Yes Take 30 mg by mouth daily.  [provider] Taking Active   fluorouracil (EFUDEX) 5 % cream [id number] Yes Apply 1 application topically as needed.  [provider] Taking Active   HYDROcodone-homatropine [facility]) 5-1.5 MG/5ML syrup [id number] Yes Take 5 mLs by mouth every 8 (eight) hours as needed for cough. Jodelle Green, FNP Taking Active   Melatonin 5 MG TABS [id number]  Take 5 mg by mouth at bedtime as needed (sleep). [provider]  Active   Multiple Vitamins-Minerals  (CENTRUM ADULTS PO) [id number]  Take 1 tablet by mouth daily. [provider]  Active   nitroGLYCERIN (NITROSTAT) 0.4 MG SL tablet [id number]  Place 1 tablet (0.4 mg total) under the tongue every 5 (five) minutes as needed for chest pain. Please make overdue appt with Dr. Burt Knack before anymore refills. Thank you 1st attempt Sherren Mocha, MD  Active   pantoprazole (PROTONIX) 20 MG tablet [id number] Yes Take 1 tablet (20 mg total) by mouth daily. Vivi Barrack, MD Taking Active   polyethylene glycol powder [facility]) powder [id number]  Take 17 g by mouth as needed for mild constipation.  [provider]  Active   SYNTHROID 88 MCG tablet [id number] Yes Take 1 tablet (88 mcg total) by mouth daily. Vivi Barrack, MD Taking Active   tamsulosin [facility]) 0.4 MG CAPS capsule [id number] Yes TAKE 2 CAPSULES BY MOUTH EVERY DAY. Vivi Barrack, MD Taking Active             Patient Active Problem List   Diagnosis Date Noted   GERD (gastroesophageal reflux disease) 09/14/2018   Asthma 09/14/2018   Spinal stenosis of lumbar region with neurogenic claudication 08/13/2017   CAD (coronary artery disease) 12/17/2016   Hypercholesterolemia 12/10/2016   History of skin cancer 08/28/2016   BPH associated with nocturia 05/17/2014   Hypothyroidism 03/11/2007    Immunization History  Administered Date(s) Administered   Influenza Split 04/29/2012   Influenza Whole 05/05/2009   Influenza, High Dose Seasonal PF 05/13/2013, 04/19/2019   Influenza-Unspecified 05/03/2014, 05/26/2015, 04/16/2016, 04/22/2017   PFIZER(Purple Top)SARS-COV-2 Vaccination 08/27/2019, 09/17/2019   Pneumococcal Conjugate-13 06/05/2015   Pneumococcal Polysaccharide-23 01/24/2010, 08/28/2016   Td 08/05/2005   Tdap 09/03/2017   Zoster Recombinat (Shingrix) 03/22/2017, 07/22/2017   Zoster, Live 05/28/2013    Conditions to be addressed/monitored:  CAD, Asthma, GERD, Hypothyroidism, Hypercholesterolemia, Spinal  Stenosis  Care Plan : General Pharmacy (Adult)  Updates made by Edythe Clarity, RPH since 02/26/2021 12:00 AM     Problem: CAD, Asthma, GERD, Hypothyroidism, Hypercholesterolemia, Spinal Stenosis   Priority: High  Onset Date: 02/26/2021  Long-Range Goal: Patient-Specific Goal   Start Date: 02/26/2021  Expected End Date: 08/29/2021  This Visit's Progress: On track  Priority: High  Note:   Current Barriers:  Unable to achieve control of back pain   Pharmacist Clinical Goal(s):  Patient will achieve adherence to monitoring guidelines and medication adherence to achieve therapeutic efficacy adhere to prescribed medication regimen as evidenced by pill box contact provider office for questions/concerns as evidenced notation of same in electronic health record through collaboration with PharmD and provider.   Interventions: 1:1 collaboration with Vivi Barrack, MD regarding development and update of comprehensive plan of care as evidenced by provider attestation and co-signature Inter-disciplinary care team collaboration (see longitudinal plan of care) Comprehensive medication review performed; medication list updated in electronic medical record  Hyperlipidemia/CAD: (LDL goal < 70) -Controlled -Current treatment: Atorvastatin 59m daily ASA 827mdaily -Medications previously tried: none noted  -Current exercise habits: golfs, exercise is limited due to back pain -Educated on Cholesterol goals;  Benefits of statin for ASCVD risk reduction; Importance of limiting foods high in cholesterol; Strategies to manage statin-induced myalgias; -He is getting blood work today for updated lipid panel, most recent LDL well controlled  -He is adherent with meds and reports no myalgias -Counseled on diet and exercise extensively Recommended to continue current medication  GERD (Goal: Control Symptoms) -Controlled -Current treatment  Pantoprazole 2045maily - takes at  night -Medications previously tried: OTC tums, etc. -Patient has severe symptoms when not taking this medication. -Takes appropriately at night because his symptoms used to wake him up.  -Recommended to continue current medication  Spinal Stenosis (Goal: Reduce pain/improve QOL) -Not ideally controlled -Current treatment  Tylenol prn Injections from neuro - steroid -Medications previously tried: none noted  -Has history of taking large quantities of NSAIDs for relief.  Discussed other alternatives for pain relief such as Tramadol to take prn.  Kidney function is still stable.  He sees neuro for pain management. -Recommended to continue current medication Recommended he discuss possible tramadol prn with neuro at next visit.  Hypothyroidism (Goal: Maintain TSH) -Controlled -Current treatment  Synthroid 59m65maily -Medications previously tried: none noted -Takes appropriately on empty stomach 30-60 mins before food and drink -TSH is WNL  -Recommended to continue current medication  Asthma (Goal: Minimze symptoms) -Controlled -Current treatment  Albuterol HFA 90 mcg daily -Medications previously tried: none noted -Patient rarely has to use inhaler  -Recommended to continue current medication  Patient Goals/Self-Care Activities Patient will:  - take medications as prescribed check blood pressure periodically, document, and provide at future appointments collaborate with provider on medication access solutions  Follow Up Plan: The care management team will reach out to the patient again over the next 180 days.       Medication Assistance: None required.  Patient affirms current coverage meets needs.  Compliance/Adherence/Medication fill history: Care Gaps: None noted  Star-Rating Drugs: Atorvastatin 80 mg once daily  Patient's preferred pharmacy is:  [id number] [place] [facility] [id number]:  [phone] pill box? Yes Pt endorses 100% compliance  We discussed: Benefits of medication synchronization, packaging and delivery as well as enhanced pharmacist oversight with [employer]. Patient decided to: Continue current medication management strategy  Care Plan and Follow Up Patient Decision:  Patient agrees to Care Plan and Follow-up.  Plan: The care management team will reach out to the patient again over the next 180 days.  Beverly Milch, PharmD Clinical Pharmacist [phone]

## 2021-02-27 NOTE — Progress Notes (Signed)
Please inform patient of the following:  Labs all stable. We can check again in 6 months.  Ryan [name]. [name], MD 02/27/2021 1:03 PM

## 2021-03-01 ENCOUNTER — Other Ambulatory Visit: Payer: Self-pay | Admitting: Family Medicine

## 2021-03-02 ENCOUNTER — Telehealth: Payer: Self-pay | Admitting: Pharmacist

## 2021-03-02 NOTE — Chronic Care Management (AMB) (Addendum)
    Chronic Care Management Pharmacy Assistant   Name: Tylee Biers  MRN: [id number] DOB: 02/14/1946   Reason for Encounter: AVS mailed  AVS printed and mailed to patient to address provided./ls,CMA   Jobe Gibbon, [place] Pharmacist Assistant  [phone]

## 2021-03-06 ENCOUNTER — Other Ambulatory Visit: Payer: Self-pay

## 2021-03-06 ENCOUNTER — Ambulatory Visit (INDEPENDENT_AMBULATORY_CARE_PROVIDER_SITE_OTHER): Payer: Medicare Other | Admitting: Cardiovascular Disease

## 2021-03-06 ENCOUNTER — Encounter: Payer: Self-pay | Admitting: Cardiovascular Disease

## 2021-03-06 VITALS — BP 138/74 | HR 56 | Ht 72.0 in | Wt 213.2 lb

## 2021-03-06 DIAGNOSIS — E782 Mixed hyperlipidemia: Secondary | ICD-10-CM

## 2021-03-06 DIAGNOSIS — I2510 Atherosclerotic heart disease of native coronary artery without angina pectoris: Secondary | ICD-10-CM

## 2021-03-06 NOTE — Patient Instructions (Signed)
Medication Instructions:  No changes *If you need a refill on your cardiac medications before your next appointment, please call your pharmacy*   Lab Work: none  Testing/Procedures: none  Follow-Up: At [facility], you and your health needs are our priority.  As part of our continuing mission to provide you with exceptional heart care, we have created designated Provider Care Teams.  These Care Teams include your primary Cardiologist (physician) and Advanced Practice Providers (APPs -  Physician Assistants and Nurse Practitioners) who all work together to provide you with the care you need, when you need it.   Your next appointment:   12 month(s)  The format for your next appointment:   In Person  Provider:   You may see Michael Cooper, MD or one of the following Advanced Practice Providers on your designated Care Team:   Scott Weaver, PA-C Vin Bhagat, PA-C   Other Instructions   

## 2021-03-06 NOTE — Progress Notes (Signed)
Cardiology Office Note:    Date:  03/06/2021   ID:  [name], DOB 1945-12-04, MRN [id number]  PCP:  Vivi Barrack, MD   [facility] Providers Cardiologist:  Sherren Mocha, MD     Referring MD: Vivi Barrack, MD   Chief Complaint  Patient presents with   Coronary Artery Disease     History of Present Illness:    Ryan [name] is a 75 y.o. male with a hx of coronary artery disease, presenting for follow-up evaluation.  He initially presented in 2018 with a STEMI involving the left circumflex, treated with a drug-eluting stent at that time.  He has done well since then with no recurrent angina.  He was last seen in June 2021 at which time he was felt to be doing well.  He has not tolerated a beta-blocker because of bradycardia.  Lipids have been at goal range on a high intensity statin drug.  The patient is here with his wife today.  He is doing well with no symptoms of chest pain, chest pressure, or shortness of breath.  He has had no heart palpitations, lightheadedness, or syncope.  His medications have not changed since his last visit.  He has been maintained on aspirin for antiplatelet therapy and a high intensity statin drug.  They are still spending part of their time at [facility] and part of their time here in [place].  The patient's physical activity has been limited by low back problems.  Past Medical History:  Diagnosis Date   Allergy    seasonal   Anxiety    Arthritis    Asthma    Cataract    Coronary artery disease 12/08/2016   STEMI, DES Circumflex   GERD (gastroesophageal reflux disease)    Hyperlipidemia    Hypothyroidism    STEMI (ST elevation myocardial infarction) ([place]) 12/08/2016    Past Surgical History:  Procedure Laterality Date   cataract Bilateral 03/2016   COLONOSCOPY  10.27.2005   COLONOSCOPY  06/30/2018   epidural steroid shot     x 3 , lumbar spine area   LEFT HEART CATH AND CORONARY ANGIOGRAPHY N/A 12/08/2016   Procedure:  Left Heart Cath and Coronary Angiography;  Surgeon: Sherren Mocha, MD;  Location: [facility];  Service: Cardiovascular;  Laterality: N/A;   POLYPECTOMY     SHOULDER SURGERY Right    Rotator Cuff Tear Repair   TONSILLECTOMY     UPPER GASTROINTESTINAL ENDOSCOPY      Current Medications: Current Meds  Medication Sig   acetaminophen (TYLENOL) 325 MG tablet Take 325-650 mg by mouth every 6 (six) hours as needed for mild pain or moderate pain.   albuterol (PROVENTIL HFA;VENTOLIN HFA) 108 (90 Base) MCG/ACT inhaler Inhale 2 puffs into the lungs every 6 (six) hours as needed for wheezing.   aspirin EC 81 MG EC tablet Take 1 tablet (81 mg total) by mouth daily.   atorvastatin (LIPITOR) 80 MG tablet TAKE 1 TABLET BY MOUTH EVERY DAY   bisacodyl (DULCOLAX) 5 MG EC tablet Take 5 mg by mouth daily as needed for moderate constipation.   co-enzyme Q-10 30 MG capsule Take 30 mg by mouth daily.    fluorouracil (EFUDEX) 5 % cream Apply 1 application topically as needed.    HYDROcodone-homatropine (HYCODAN) 5-1.5 MG/5ML syrup Take 5 mLs by mouth every 8 (eight) hours as needed for cough.   Melatonin 5 MG TABS Take 5 mg by mouth at bedtime as needed (  sleep).   Multiple Vitamins-Minerals (CENTRUM ADULTS PO) Take 1 tablet by mouth daily.   nitroGLYCERIN (NITROSTAT) 0.4 MG SL tablet Place 1 tablet (0.4 mg total) under the tongue every 5 (five) minutes as needed for chest pain. Please make overdue appt with Dr. Burt Knack before anymore refills. Thank you 1st attempt   pantoprazole (PROTONIX) 20 MG tablet Take 1 tablet (20 mg total) by mouth daily.   polyethylene glycol powder (GLYCOLAX/MIRALAX) powder Take 17 g by mouth as needed for mild constipation.    SYNTHROID 88 MCG tablet Take 1 tablet (88 mcg total) by mouth daily.   tamsulosin (FLOMAX) 0.4 MG CAPS capsule TAKE 2 CAPSULES BY MOUTH EVERY DAY     Allergies:   Contrast media [iodinated diagnostic agents], Erythromycin, Niacin and related, and  Penicillins   Social History   Socioeconomic History   Marital status: Married    Spouse name: Not on file   Number of children: 2   Years of education: Not on file   Highest education level: Not on file  Occupational History   Occupation: retired  Tobacco Use   Smoking status: Never   Smokeless tobacco: Never  Vaping Use   Vaping Use: Never used  Substance and Sexual Activity   Alcohol use: Yes    Comment: occasional   Drug use: No   Sexual activity: Not on file  Other Topics Concern   Not on file  Social History Narrative   Not on file   Social Determinants of Health   Financial Resource Strain: Low Risk    Difficulty of Paying Living Expenses: Not very hard  Food Insecurity: Not on file  Transportation Needs: Not on file  Physical Activity: Not on file  Stress: Not on file  Social Connections: Not on file     Family History: The patient's family history includes Diabetes in his mother; Diverticulosis in his mother and sister; Heart disease in his sister; Heart failure in his mother; Irritable bowel syndrome in his mother and sister; Lung cancer in his father. There is no history of Colon cancer, Esophageal cancer, Pancreatic cancer, or Stomach cancer.  ROS:   Please see the history of present illness.    All other systems reviewed and are negative.  EKGs/Labs/Other Studies Reviewed:    The following studies were reviewed today: Cardiac Catheterization 12/08/2016: Conclusion  1. Severe thrombotic stenosis of the left circumflex treated successfully with Primary PCI using a 3.5x24 mm Promus DES 2. Moderate diffuse LAD, RCA, and ramus intermedius stenoses 3. Normal LV function   Recommend: Aggressive medical therapy The patient's residual CAD affects small caliber vessels and will be best managed medically Aggrastat x 6 hours DAPT with ASA and brilinta x 12 months without interruption Anticipate discharge 48 hours if no complications   EKG:  EKG is ordered  today.  The ekg ordered today demonstrates sinus bradycardia 56 bpm, otherwise within normal limits  Recent Labs: 02/26/2021: ALT 26; BUN 17; Creatinine, Ser 0.86; Hemoglobin 14.0; Platelets 238.0; Potassium 5.0; Sodium 140; TSH 4.21  Recent Lipid Panel    Component Value Date/Time   CHOL 134 10/02/2020 0911   CHOL 89 (L) 01/21/2017 0904   TRIG 61.0 10/02/2020 0911   HDL 52.00 10/02/2020 0911   HDL 31 (L) 01/21/2017 0904   CHOLHDL 3 10/02/2020 0911   VLDL 12.2 10/02/2020 0911   LDLCALC 69 10/02/2020 0911   LDLCALC 62 03/28/2020 1055   LDLDIRECT 144.0 05/13/2013 1036     Risk Assessment/Calculations:  Physical Exam:    VS:  BP 138/74   Pulse (!) 56   Ht 6' (1.829 m)   Wt 213 lb 3.2 oz (96.7 kg)   SpO2 97%   BMI 28.92 kg/m     Wt Readings from Last 3 Encounters:  03/06/21 213 lb 3.2 oz (96.7 kg)  10/02/20 210 lb 12.8 oz (95.6 kg)  03/28/20 208 lb (94.3 kg)     GEN:  Well nourished, well developed in no acute distress HEENT: Normal NECK: No JVD; No carotid bruits LYMPHATICS: No lymphadenopathy CARDIAC: RRR, no murmurs, rubs, gallops RESPIRATORY:  Clear to auscultation without rales, wheezing or rhonchi  ABDOMEN: Soft, non-tender, non-distended MUSCULOSKELETAL:  No edema; No deformity  SKIN: Warm and dry NEUROLOGIC:  Alert and oriented x 3 PSYCHIATRIC:  Normal affect   ASSESSMENT:    1. Coronary artery disease involving native coronary artery of native heart without angina pectoris   2. Mixed hyperlipidemia    PLAN:    In order of problems listed above:  The patient is doing well with no symptoms of angina.  No medication changes are made today.  We discussed some other options for exercise such as a stationary bike in light of his back problems.  I will see him back in 1 year for follow-up evaluation.   Most recent lipids reviewed from February 2022 with a cholesterol 134, HDL 52, LDL 69.  He is treated with atorvastatin 80 mg daily.  LFTs were  recently checked and they are within normal limits.     Medication Adjustments/Labs and Tests Ordered: Current medicines are reviewed at length with the patient today.  Concerns regarding medicines are outlined above.  Orders Placed This Encounter  Procedures   EKG 12-Lead    No orders of the defined types were placed in this encounter.   Patient Instructions  Medication Instructions:  No changes *If you need a refill on your cardiac medications before your next appointment, please call your pharmacy*   Lab Work: none  Testing/Procedures: none   Follow-Up: At [facility], you and your health needs are our priority.  As part of our continuing mission to provide you with exceptional heart care, we have created designated Provider Care Teams.  These Care Teams include your primary Cardiologist (physician) and Advanced Practice Providers (APPs -  Physician Assistants and Nurse Practitioners) who all work together to provide you with the care you need, when you need it.   Your next appointment:   12 month(s)  The format for your next appointment:   In Person  Provider:   You may see Sherren Mocha, MD or one of the following Advanced Practice Providers on your designated Care Team:   Richardson Dopp, PA-C Robbie Lis, [place]   Other Instructions     Signed, Sherren Mocha, MD  03/06/2021 3:19 PM    [place]

## 2021-03-12 ENCOUNTER — Telehealth: Payer: Self-pay

## 2021-03-12 NOTE — Telephone Encounter (Signed)
Patient's wife called requesting Hydrocodone. She stated that it was last refilled in 2019 and she had to come into the office for a hand written prescription. She stated that they are going out of town and she likes to keep it with her in case anything happens. Can the prescription be refilled?

## 2021-03-12 NOTE — Telephone Encounter (Signed)
Please schedule appointment with the pt.  Thank You! 

## 2021-03-12 NOTE — Telephone Encounter (Signed)
Patient need OV 

## 2021-03-13 NOTE — Telephone Encounter (Signed)
Patient states that they are out of town, offered a virtual visit but patient states he will call back.

## 2021-04-26 ENCOUNTER — Encounter: Payer: Self-pay | Admitting: Family

## 2021-04-26 ENCOUNTER — Other Ambulatory Visit: Payer: Self-pay

## 2021-04-26 ENCOUNTER — Telehealth (INDEPENDENT_AMBULATORY_CARE_PROVIDER_SITE_OTHER): Payer: Medicare Other | Admitting: Family

## 2021-04-26 VITALS — Ht 72.0 in | Wt 213.2 lb

## 2021-04-26 DIAGNOSIS — I2510 Atherosclerotic heart disease of native coronary artery without angina pectoris: Secondary | ICD-10-CM | POA: Diagnosis not present

## 2021-04-26 DIAGNOSIS — R059 Cough, unspecified: Secondary | ICD-10-CM | POA: Diagnosis not present

## 2021-04-26 DIAGNOSIS — U071 COVID-19: Secondary | ICD-10-CM

## 2021-04-26 MED ORDER — NIRMATRELVIR/RITONAVIR (PAXLOVID)TABLET: 3.0000 | ORAL_TABLET | Freq: Two times a day (BID) | ORAL | 0 refills | Status: AC

## 2021-04-26 MED ORDER — HYDROCODONE BIT-HOMATROP MBR 5-1.5 MG/5ML PO SOLN: 5.0000 mL | Freq: Three times a day (TID) | ORAL | 0 refills | Status: DC | PRN

## 2021-04-30 NOTE — Progress Notes (Signed)
Virtual Visit via Video   I connected with patient on 04/30/21 at  2:00 PM EDT by a video enabled telemedicine application and verified that I am speaking with the correct person using two identifiers.  Location patient: Home Location provider: [facility], Office Persons participating in the virtual visit: Dutch Quint, Jerrel Ivory  I discussed the limitations of evaluation and management by telemedicine and the availability of in person appointments. The patient expressed understanding and agreed to proceed.  Subjective:   HPI:   75 year old male presents virtually after testing positive for COVID-19 at home by rapid antigen test. Wife os positive for COVID as well. He has cough, congestion and mild SOB. Taking OTC medications that have helped.   ROS:   See pertinent positives and negatives per HPI.  Patient Active Problem List   Diagnosis Date Noted   GERD (gastroesophageal reflux disease) 09/14/2018   Asthma 09/14/2018   Spinal stenosis of lumbar region with neurogenic claudication 08/13/2017   CAD (coronary artery disease) 12/17/2016   Hypercholesterolemia 12/10/2016   History of skin cancer 08/28/2016   BPH associated with nocturia 05/17/2014   Hypothyroidism 03/11/2007    Social History   Tobacco Use   Smoking status: Never   Smokeless tobacco: Never  Substance Use Topics   Alcohol use: Yes    Comment: occasional    Current Outpatient Medications:    acetaminophen (TYLENOL) 325 MG tablet, Take 325-650 mg by mouth every 6 (six) hours as needed for mild pain or moderate pain., Disp: , Rfl:    albuterol (PROVENTIL HFA;VENTOLIN HFA) 108 (90 Base) MCG/ACT inhaler, Inhale 2 puffs into the lungs every 6 (six) hours as needed for wheezing., Disp: 1 Inhaler, Rfl: 1   aspirin EC 81 MG EC tablet, Take 1 tablet (81 mg total) by mouth daily., Disp: 30 tablet, Rfl: 11   atorvastatin (LIPITOR) 80 MG tablet, TAKE 1 TABLET BY MOUTH EVERY DAY, Disp: 90 tablet, Rfl:  1   bisacodyl (DULCOLAX) 5 MG EC tablet, Take 5 mg by mouth daily as needed for moderate constipation., Disp: , Rfl:    co-enzyme Q-10 30 MG capsule, Take 30 mg by mouth daily. , Disp: , Rfl:    fluorouracil (EFUDEX) 5 % cream, Apply 1 application topically as needed. , Disp: , Rfl:    HYDROcodone bit-homatropine (HYCODAN) 5-1.5 MG/5ML syrup, Take 5 mLs by mouth every 8 (eight) hours as needed for cough., Disp: 120 mL, Rfl: 0   HYDROcodone-homatropine (HYCODAN) 5-1.5 MG/5ML syrup, Take 5 mLs by mouth every 8 (eight) hours as needed for cough., Disp: 120 mL, Rfl: 0   Melatonin 5 MG TABS, Take 5 mg by mouth at bedtime as needed (sleep)., Disp: , Rfl:    Multiple Vitamins-Minerals (CENTRUM ADULTS PO), Take 1 tablet by mouth daily., Disp: , Rfl:    nirmatrelvir/ritonavir EUA (PAXLOVID) 20 x 150 MG & 10 x 100MG  TABS, Take 3 tablets by mouth 2 (two) times daily for 5 days. (Take nirmatrelvir 150 mg two tablets twice daily for 5 days and ritonavir 100 mg one tablet twice daily for 5 days) Patient GFR is 86, Disp: 30 tablet, Rfl: 0   nitroGLYCERIN (NITROSTAT) 0.4 MG SL tablet, Place 1 tablet (0.4 mg total) under the tongue every 5 (five) minutes as needed for chest pain. Please make overdue appt with Dr. Burt Knack before anymore refills. Thank you 1st attempt, Disp: 25 tablet, Rfl: 0   pantoprazole (PROTONIX) 20 MG tablet, Take 1 tablet (20  mg total) by mouth daily., Disp: 90 tablet, Rfl: 3   polyethylene glycol powder (GLYCOLAX/MIRALAX) powder, Take 17 g by mouth as needed for mild constipation. , Disp: , Rfl:    SYNTHROID 88 MCG tablet, Take 1 tablet (88 mcg total) by mouth daily., Disp: 90 tablet, Rfl: 3   tamsulosin (FLOMAX) 0.4 MG CAPS capsule, TAKE 2 CAPSULES BY MOUTH EVERY DAY, Disp: 180 capsule, Rfl: 0  Allergies  Allergen Reactions   Contrast Media [Iodinated Diagnostic Agents] Shortness Of Breath and Swelling    Swelling of the throat    Erythromycin Nausea And Vomiting   Niacin And Related  Swelling   Penicillins Hives    Has patient had a PCN reaction causing immediate rash, facial/tongue/throat swelling, SOB or lightheadedness with hypotension: Y Has patient had a PCN reaction causing severe rash involving mucus membranes or skin necrosis: No Has patient had a PCN reaction that required hospitalization: No Has patient had a PCN reaction occurring within the last 10 years: No If all of the above answers are "NO", then may proceed with Cephalosporin use.    Objective:   Ht 6' (1.829 m)   Wt 213 lb 3 oz (96.7 kg)   BMI 28.91 kg/m   Patient is well-developed, well-nourished in no acute distress.  Resting comfortably at home.  Head is normocephalic, atraumatic.  No labored breathing.  Speech is clear and coherent with logical content.  Patient is alert and oriented at baseline.    Assessment and Plan:   Ocean was seen today for covid positive, cough and nasal congestion.  Diagnoses and all orders for this visit:  COVID-19  Cough  Other orders -     nirmatrelvir/ritonavir EUA (PAXLOVID) 20 x 150 MG & 10 x 100MG  TABS; Take 3 tablets by mouth 2 (two) times daily for 5 days. (Take nirmatrelvir 150 mg two tablets twice daily for 5 days and ritonavir 100 mg one tablet twice daily for 5 days) Patient GFR is 86 -     HYDROcodone bit-homatropine (HYCODAN) 5-1.5 MG/5ML syrup; Take 5 mLs by mouth every 8 (eight) hours as needed for cough.   Call the office if symptoms worsen or persist. Recheck as scheduled and as needed.   [name], FNP 04/30/2021

## 2021-05-03 ENCOUNTER — Encounter: Payer: Self-pay | Admitting: Physician Assistant

## 2021-05-03 ENCOUNTER — Telehealth (INDEPENDENT_AMBULATORY_CARE_PROVIDER_SITE_OTHER): Payer: Medicare Other | Admitting: Physician Assistant

## 2021-05-03 VITALS — Ht 72.0 in | Wt 210.0 lb

## 2021-05-03 DIAGNOSIS — R059 Cough, unspecified: Secondary | ICD-10-CM

## 2021-05-03 MED ORDER — DOXYCYCLINE HYCLATE 100 MG PO TABS: 100.0000 mg | ORAL_TABLET | Freq: Two times a day (BID) | ORAL | 0 refills | Status: DC

## 2021-05-03 MED ORDER — ALBUTEROL SULFATE HFA 108 (90 BASE) MCG/ACT IN AERS: 2.0000 | INHALATION_SPRAY | RESPIRATORY_TRACT | 2 refills | Status: AC | PRN

## 2021-05-03 MED ORDER — PREDNISONE 50 MG PO TABS: ORAL_TABLET | ORAL | 0 refills | Status: DC

## 2021-05-03 NOTE — Progress Notes (Signed)
Virtual Visit via Video   I connected with Ryan [name] on 05/03/21 at 11:30 AM EDT by a video enabled telemedicine application and verified that I am speaking with the correct person using two identifiers. Location patient: Home Location provider: [facility], Office Persons participating in the virtual visit: Roddy Rayquon Uselman, [name] PA-C, Anselmo Pickler, LPN   I discussed the limitations of evaluation and management by telemedicine and the availability of in person appointments. The patient expressed understanding and agreed to proceed.  I acted as a Education administrator for [employer], [employer], LPN   Subjective:   HPI:   Cough Patient was diagnosed with COVID on 04/26/2021.  He started Paxil again and took this as prescribed.  He was also given Hycodan cough syrup. Pt c/o coughing and expectorating yellow sputum. Using Hycodan cough syrup but does not seem to be helping, started Mucinex Monday night. Having chest congestion. Denies fever or chills, no chest pain or SOB. Pt is having some wheezing ran out of inhaler.   He does have a history of asthma that is usually very well controlled at baseline.  ROS: See pertinent positives and negatives per HPI.  Patient Active Problem List   Diagnosis Date Noted   GERD (gastroesophageal reflux disease) 09/14/2018   Asthma 09/14/2018   Spinal stenosis of lumbar region with neurogenic claudication 08/13/2017   CAD (coronary artery disease) 12/17/2016   Hypercholesterolemia 12/10/2016   History of skin cancer 08/28/2016   BPH associated with nocturia 05/17/2014   Hypothyroidism 03/11/2007    Social History   Tobacco Use   Smoking status: Never   Smokeless tobacco: Never  Substance Use Topics   Alcohol use: Yes    Comment: occasional    Current Outpatient Medications:    acetaminophen (TYLENOL) 325 MG tablet, Take 325-650 mg by mouth every 6 (six) hours as needed for mild pain or moderate pain., Disp: , Rfl:     aspirin EC 81 MG EC tablet, Take 1 tablet (81 mg total) by mouth daily., Disp: 30 tablet, Rfl: 11   atorvastatin (LIPITOR) 80 MG tablet, TAKE 1 TABLET BY MOUTH EVERY DAY, Disp: 90 tablet, Rfl: 1   bisacodyl (DULCOLAX) 5 MG EC tablet, Take 5 mg by mouth daily as needed for moderate constipation., Disp: , Rfl:    co-enzyme Q-10 30 MG capsule, Take 30 mg by mouth daily. , Disp: , Rfl:    doxycycline (VIBRA-TABS) 100 MG tablet, Take 1 tablet (100 mg total) by mouth 2 (two) times daily., Disp: 10 tablet, Rfl: 0   fluorouracil (EFUDEX) 5 % cream, Apply 1 application topically as needed. , Disp: , Rfl:    HYDROcodone bit-homatropine (HYCODAN) 5-1.5 MG/5ML syrup, Take 5 mLs by mouth every 8 (eight) hours as needed for cough., Disp: 120 mL, Rfl: 0   Melatonin 5 MG TABS, Take 5 mg by mouth at bedtime as needed (sleep)., Disp: , Rfl:    Multiple Vitamins-Minerals (CENTRUM ADULTS PO), Take 1 tablet by mouth daily., Disp: , Rfl:    nitroGLYCERIN (NITROSTAT) 0.4 MG SL tablet, Place 1 tablet (0.4 mg total) under the tongue every 5 (five) minutes as needed for chest pain. Please make overdue appt with Dr. Burt Knack before anymore refills. Thank you 1st attempt, Disp: 25 tablet, Rfl: 0   pantoprazole (PROTONIX) 20 MG tablet, Take 1 tablet (20 mg total) by mouth daily., Disp: 90 tablet, Rfl: 3   polyethylene glycol powder (GLYCOLAX/MIRALAX) powder, Take 17 g by mouth as  needed for mild constipation. , Disp: , Rfl:    predniSONE (DELTASONE) 50 MG tablet, Take 1 tablet daily, Disp: 5 tablet, Rfl: 0   SYNTHROID 88 MCG tablet, Take 1 tablet (88 mcg total) by mouth daily., Disp: 90 tablet, Rfl: 3   tamsulosin (FLOMAX) 0.4 MG CAPS capsule, TAKE 2 CAPSULES BY MOUTH EVERY DAY, Disp: 180 capsule, Rfl: 0   albuterol (VENTOLIN HFA) 108 (90 Base) MCG/ACT inhaler, Inhale 2 puffs into the lungs every 4 (four) hours as needed for wheezing., Disp: 1 each, Rfl: 2  Allergies  Allergen Reactions   Contrast Media [Iodinated  Diagnostic Agents] Shortness Of Breath and Swelling    Swelling of the throat    Erythromycin Nausea And Vomiting   Niacin And Related Swelling   Penicillins Hives    Has patient had a PCN reaction causing immediate rash, facial/tongue/throat swelling, SOB or lightheadedness with hypotension: Y Has patient had a PCN reaction causing severe rash involving mucus membranes or skin necrosis: No Has patient had a PCN reaction that required hospitalization: No Has patient had a PCN reaction occurring within the last 10 years: No If all of the above answers are "NO", then may proceed with Cephalosporin use.    Objective:   VITALS: Per patient if applicable, see vitals. GENERAL: Alert, appears well and in no acute distress. HEENT: Atraumatic, conjunctiva clear, no obvious abnormalities on inspection of external nose and ears. NECK: Normal movements of the head and neck. CARDIOPULMONARY: No increased WOB. Speaking in clear sentences. I:E ratio WNL.  MS: Moves all visible extremities without noticeable abnormality. PSYCH: Pleasant and cooperative, well-groomed. Speech normal rate and rhythm. Affect is appropriate. Insight and judgement are appropriate. Attention is focused, linear, and appropriate.  NEURO: CN grossly intact. Oriented as arrived to appointment on time with no prompting. Moves both UE equally.  SKIN: No obvious lesions, wounds, erythema, or cyanosis noted on face or hands.  Assessment and Plan:   Airon was seen today for cough.  Diagnoses and all orders for this visit:  Cough  Other orders -     albuterol (VENTOLIN HFA) 108 (90 Base) MCG/ACT inhaler; Inhale 2 puffs into the lungs every 4 (four) hours as needed for wheezing. -     doxycycline (VIBRA-TABS) 100 MG tablet; Take 1 tablet (100 mg total) by mouth 2 (two) times daily. -     predniSONE (DELTASONE) 50 MG tablet; Take 1 tablet daily  Concern for possible early pneumonia Start oral doxycycline and oral prednisone and take  as prescribed Refill albuterol inhaler Worsening precautions advised in the interim Low threshold for an office evaluation to check vitals and perform in office lung exam If any worsening symptoms needs to go to the emergency room  I discussed the assessment and treatment plan with the patient. The patient was provided an opportunity to ask questions and all were answered. The patient agreed with the plan and demonstrated an understanding of the instructions.   The patient was advised to call back or seek an in-person evaluation if the symptoms worsen or if the condition fails to improve as anticipated.   CMA or LPN served as scribe during this visit. History, Physical, and Plan performed by medical provider. The above documentation has been reviewed and is accurate and complete.  [place], [place] 05/03/2021

## 2021-05-14 ENCOUNTER — Telehealth: Payer: Self-pay | Admitting: Pharmacist

## 2021-05-14 NOTE — Chronic Care Management (AMB) (Signed)
Chronic Care Management Pharmacy Assistant   Name: Ryan [name]  MRN: [id number] DOB: 10-Dec-1945   Reason for Encounter: General Adherence Call    Recent office visits:  04/26/2021 (VV) [name], FNP; Covid-19, Paxlovid and Hycodan as directed.  05/03/2021 (VV) [name], [place]; Concern for possible early pneumonia, Ventolin HFA and Prednisone as directed.  Recent consult visits:  03/06/2021 OV (cardiology) Sherren Mocha, MD; CAD, no medication changes indicated.  Hospital visits:  None in previous 6 months  Medications: Outpatient Encounter Medications as of 05/14/2021  Medication Sig   acetaminophen (TYLENOL) 325 MG tablet Take 325-650 mg by mouth every 6 (six) hours as needed for mild pain or moderate pain.   albuterol (VENTOLIN HFA) 108 (90 Base) MCG/ACT inhaler Inhale 2 puffs into the lungs every 4 (four) hours as needed for wheezing.   aspirin EC 81 MG EC tablet Take 1 tablet (81 mg total) by mouth daily.   atorvastatin (LIPITOR) 80 MG tablet TAKE 1 TABLET BY MOUTH EVERY DAY   bisacodyl (DULCOLAX) 5 MG EC tablet Take 5 mg by mouth daily as needed for moderate constipation.   co-enzyme Q-10 30 MG capsule Take 30 mg by mouth daily.    doxycycline (VIBRA-TABS) 100 MG tablet Take 1 tablet (100 mg total) by mouth 2 (two) times daily.   fluorouracil (EFUDEX) 5 % cream Apply 1 application topically as needed.    HYDROcodone bit-homatropine (HYCODAN) 5-1.5 MG/5ML syrup Take 5 mLs by mouth every 8 (eight) hours as needed for cough.   Melatonin 5 MG TABS Take 5 mg by mouth at bedtime as needed (sleep).   Multiple Vitamins-Minerals (CENTRUM ADULTS PO) Take 1 tablet by mouth daily.   nitroGLYCERIN (NITROSTAT) 0.4 MG SL tablet Place 1 tablet (0.4 mg total) under the tongue every 5 (five) minutes as needed for chest pain. Please make overdue appt with Dr. Burt Knack before anymore refills. Thank you 1st attempt   pantoprazole (PROTONIX) 20 MG tablet Take 1 tablet (20 mg  total) by mouth daily.   polyethylene glycol powder (GLYCOLAX/MIRALAX) powder Take 17 g by mouth as needed for mild constipation.    predniSONE (DELTASONE) 50 MG tablet Take 1 tablet daily   SYNTHROID 88 MCG tablet Take 1 tablet (88 mcg total) by mouth daily.   tamsulosin (FLOMAX) 0.4 MG CAPS capsule TAKE 2 CAPSULES BY MOUTH EVERY DAY   No facility-administered encounter medications on file as of 05/14/2021.   Patient Questions: Have you had any problems recently with your health? Patient states he recently tested positive for COVID-19. He states he is recovering well but is still having some minor symptoms. He stated he would like to get refills on Albuterol inhaler and Hydrocodone cough medicine from Dr. [name].  How is your pain? Have you started taking anything for it? Patient states his back pain is still the same. He states he recently had an ablation in his back. He hasn't started taking anything for it.  Have you had any problems with your pharmacy? Patient denies having any problems with his pharmacy.  What issues or side effects are you having with your medications? Patient states he is not having any issues or side effects with any of his medications.  What would you like me to pass along to Leata Mouse, CPP for him to help you with?  Patient states he would like to have refills on albuterol refill and hydrocodone cough syrup [facility] [address]  In [place]. - Request sent to [facility]ill  What can we do to take care of you better? Patient did not have any suggestions. He is happy with his current level of care.   Care Gaps: Medicare Annual Wellness: Due now - scheduled for 07/06/2021 Hemoglobin A1C: 5.9% on 02/26/2021 Colonoscopy: Completed on 08/25/2019  Future Appointments  Date Time Provider [place]  07/06/2021  9:30 AM [facility] HEALTH COACH [facility]  09/03/2021  3:30 PM [facility] CCM PHARMACIST [facility]       Star Rating Drugs: Atorvastatin last filled 05/05/2020 90 DS  April D Calhoun, [place] Pharmacist Assistant [phone]

## 2021-05-15 ENCOUNTER — Other Ambulatory Visit: Payer: Self-pay | Admitting: *Deleted

## 2021-05-15 NOTE — Telephone Encounter (Signed)
He needs to come in for an office visit if he is still having symptoms.

## 2021-05-15 NOTE — Telephone Encounter (Signed)
Patient is requesting refills on medication Albuterol inhaler and [facility]codone cough syrup [facility] [address]

## 2021-05-31 ENCOUNTER — Other Ambulatory Visit: Payer: Self-pay | Admitting: Family Medicine

## 2021-07-02 ENCOUNTER — Other Ambulatory Visit: Payer: Self-pay | Admitting: Cardiovascular Disease

## 2021-07-06 ENCOUNTER — Other Ambulatory Visit: Payer: Self-pay

## 2021-07-06 ENCOUNTER — Ambulatory Visit (INDEPENDENT_AMBULATORY_CARE_PROVIDER_SITE_OTHER): Payer: Medicare Other

## 2021-07-06 DIAGNOSIS — Z0000 Encounter for general adult medical examination without abnormal findings: Secondary | ICD-10-CM | POA: Diagnosis not present

## 2021-07-06 NOTE — Progress Notes (Addendum)
Virtual Visit via Telephone Note  I connected with  Ryan [name] on 07/06/21 at  9:30 AM EST by telephone and verified that I am speaking with the correct person using two identifiers.  Medicare Annual Wellness visit completed telephonically due to Covid-19 pandemic.   Persons participating in this call: This Health Coach and this patient.   Location: Patient: home Provider: office   I discussed the limitations, risks, security and privacy concerns of performing an evaluation and management service by telephone and the availability of in person appointments. The patient expressed understanding and agreed to proceed.  Unable to perform video visit due to video visit attempted and failed and/or patient does not have video capability.   Some vital signs may be absent or patient reported.   Willette Brace, LPN   Subjective:   Ryan [name] is a 75 y.o. male who presents for Medicare Annual/Subsequent preventive examination.  Review of Systems     Cardiac Risk Factors include: advanced age (>75men, >62 women);male gender     Objective:    Today's Vitals   07/06/21 0938  PainSc: 4    There is no height or weight on file to calculate BMI.  Advanced Directives 07/06/2021 03/05/2017 01/16/2017 12/16/2016 12/08/2016 12/08/2016  Does Patient Have a Medical Advance Directive? Yes Yes Yes No No No  Type of Paramedic of [place];Living will - - - -  Does patient want to make changes to medical advance directive? - No - Patient declined No - Patient declined - - -  Copy of [place] in Chart? Yes - validated most recent copy scanned in chart (See row information) No - copy requested - - - -  Would patient like information on creating a medical advance directive? - - - No - Patient declined No - Patient declined No - Patient declined    Current Medications (verified) Outpatient Encounter Medications as of  07/06/2021  Medication Sig   acetaminophen (TYLENOL) 325 MG tablet Take 325-650 mg by mouth every 6 (six) hours as needed for mild pain or moderate pain.   albuterol (VENTOLIN HFA) 108 (90 Base) MCG/ACT inhaler Inhale 2 puffs into the lungs every 4 (four) hours as needed for wheezing.   aspirin EC 81 MG EC tablet Take 1 tablet (81 mg total) by mouth daily.   atorvastatin (LIPITOR) 80 MG tablet TAKE 1 TABLET BY MOUTH EVERY DAY   bisacodyl (DULCOLAX) 5 MG EC tablet Take 5 mg by mouth daily as needed for moderate constipation.   co-enzyme Q-10 30 MG capsule Take 30 mg by mouth daily.    doxycycline (VIBRA-TABS) 100 MG tablet Take 1 tablet (100 mg total) by mouth 2 (two) times daily.   fluorouracil (EFUDEX) 5 % cream Apply 1 application topically as needed.    HYDROcodone bit-homatropine (HYCODAN) 5-1.5 MG/5ML syrup Take 5 mLs by mouth every 8 (eight) hours as needed for cough.   Melatonin 5 MG TABS Take 5 mg by mouth at bedtime as needed (sleep).   Multiple Vitamins-Minerals (CENTRUM ADULTS PO) Take 1 tablet by mouth daily.   nitroGLYCERIN (NITROSTAT) 0.4 MG SL tablet Place 1 tablet (0.4 mg total) under the tongue every 5 (five) minutes as needed for chest pain.   pantoprazole (PROTONIX) 20 MG tablet Take 1 tablet (20 mg total) by mouth daily.   polyethylene glycol powder (GLYCOLAX/MIRALAX) powder Take 17 g by mouth as needed for mild constipation.    predniSONE (  DELTASONE) 50 MG tablet Take 1 tablet daily   SYNTHROID 88 MCG tablet Take 1 tablet (88 mcg total) by mouth daily.   tamsulosin (FLOMAX) 0.4 MG CAPS capsule TAKE 2 CAPSULES BY MOUTH EVERY DAY   No facility-administered encounter medications on file as of 07/06/2021.    Allergies (verified) Contrast media [iodinated diagnostic agents], Erythromycin, Niacin and related, and Penicillins   History: Past Medical History:  Diagnosis Date   Allergy    seasonal   Anxiety    Arthritis    Asthma    Cataract    Coronary artery disease  12/08/2016   STEMI, DES Circumflex   GERD (gastroesophageal reflux disease)    Hyperlipidemia    Hypothyroidism    STEMI (ST elevation myocardial infarction) ([place]) 12/08/2016   Past Surgical History:  Procedure Laterality Date   cataract Bilateral 03/2016   COLONOSCOPY  10.27.2005   COLONOSCOPY  06/30/2018   epidural steroid shot     x 3 , lumbar spine area   LEFT HEART CATH AND CORONARY ANGIOGRAPHY N/A 12/08/2016   Procedure: Left Heart Cath and Coronary Angiography;  Surgeon: Sherren Mocha, MD;  Location: [facility];  Service: Cardiovascular;  Laterality: N/A;   POLYPECTOMY     SHOULDER SURGERY Right    Rotator Cuff Tear Repair   TONSILLECTOMY     UPPER GASTROINTESTINAL ENDOSCOPY     Family History  Problem Relation Age of Onset   Diabetes Mother    Heart failure Mother    Irritable bowel syndrome Mother    Diverticulosis Mother    Lung cancer Father        worked in Product/process development scientist yard   Heart disease Sister    Irritable bowel syndrome Sister    Diverticulosis Sister    Colon cancer Neg Hx    Esophageal cancer Neg Hx    Pancreatic cancer Neg Hx    Stomach cancer Neg Hx    Social History   Socioeconomic History   Marital status: Married    Spouse name: Not on file   Number of children: 2   Years of education: Not on file   Highest education level: Not on file  Occupational History   Occupation: retired  Tobacco Use   Smoking status: Never   Smokeless tobacco: Never  Vaping Use   Vaping Use: Never used  Substance and Sexual Activity   Alcohol use: Yes    Comment: occasional   Drug use: No   Sexual activity: Not on file  Other Topics Concern   Not on file  Social History Narrative   Not on file   Social Determinants of Health   Financial Resource Strain: Low Risk    Difficulty of Paying Living Expenses: Not hard at all  Food Insecurity: No Food Insecurity   Worried About Charity fundraiser in the Last Year: Never true   [place] in the  Last Year: Never true  Transportation Needs: No Transportation Needs   Lack of Transportation (Medical): No   Lack of Transportation (Non-Medical): No  Physical Activity: Inactive   Days of Exercise per Week: 0 days   Minutes of Exercise per Session: 0 min  Stress: No Stress Concern Present   Feeling of Stress : Not at all  Social Connections: Moderately Integrated   Frequency of Communication with Friends and Family: More than three times a week   Frequency of Social Gatherings with Friends and Family: More than three times a  week   Attends Religious Services: More than 4 times per year   Active Member of Clubs or Organizations: No   Attends Archivist Meetings: Never   Marital Status: Married    Tobacco Counseling Counseling given: Not Answered   Clinical Intake:  Pre-visit preparation completed: Yes  Pain : 0-10 Pain Score: 4  Pain Type: Chronic pain Pain Location: Back Pain Descriptors / Indicators: Aching, Shooting, Sharp Pain Onset: More than a month ago Pain Frequency: Intermittent     BMI - recorded: 28.48 Nutritional Status: BMI 25 -29 Overweight Nutritional Risks: None Diabetes: No  How often do you need to have someone help you when you read instructions, pamphlets, or other written materials from your doctor or pharmacy?: 1 - Never  Diabetic?No  Interpreter Needed?: No  Information entered by :: Charlott Rakes, LPN   Activities of Daily Living In your present state of health, do you have any difficulty performing the following activities: 07/06/2021  Hearing? N  Vision? N  Difficulty concentrating or making decisions? N  Walking or climbing stairs? Y  Dressing or bathing? N  Doing errands, shopping? N  Preparing Food and eating ? N  Using the Toilet? N  In the past six months, have you accidently leaked urine? N  Do you have problems with loss of bowel control? N  Managing your Medications? N  Managing your Finances? N  Housekeeping  or managing your Housekeeping? N  Some recent data might be hidden    Patient Care Team: Vivi Barrack, MD as PCP - General (Family Medicine) Sherren Mocha, MD as PCP - Cardiology (Cardiology) Edythe Clarity, [facility] as Pharmacist (Pharmacist)  Indicate any recent Medical Services you may have received from other than [facility] providers in the past year (date may be approximate).     Assessment:   This is a routine wellness examination for [place].  Hearing/Vision screen Hearing Screening - Comments:: Pt denies any hearing issues  Vision Screening - Comments:: Pt follows up with provider annually   Dietary issues and exercise activities discussed: Current Exercise Habits: The patient does not participate in regular exercise at present (related to back pain)   Goals Addressed             This Visit's Progress    Patient Stated       Get back to exercise        Depression Screen PHQ 2/9 Scores 07/06/2021 09/20/2019 07/13/2018 05/05/2017 01/20/2017 08/28/2016 06/05/2015  PHQ - 2 Score 0 0 0 0 0 0 0    Fall Risk Fall Risk  07/06/2021 09/20/2019 01/16/2017 08/28/2016 06/05/2015  Falls in the past year? 0 0 No No No  Number falls in past yr: 0 - - - -  Injury with Fall? 0 - - - -  Risk for fall due to : Impaired vision;Impaired mobility - - - -  Follow up Falls prevention discussed - - - -    FALL RISK PREVENTION PERTAINING TO THE HOME:  Any stairs in or around the home? Yes  If so, are there any without handrails? No  Home free of loose throw rugs in walkways, pet beds, electrical cords, etc? Yes  Adequate lighting in your home to reduce risk of falls? Yes   ASSISTIVE DEVICES UTILIZED TO PREVENT FALLS:  Life alert? No  Use of a cane, walker or w/c? No  Grab bars in the bathroom? Yes  Shower chair or bench in shower? Yes  Elevated toilet  seat or a handicapped toilet? No   TIMED UP AND GO:  Was the test performed? No .  Cognitive Function:     6CIT Screen 07/06/2021   What Year? 0 points  What month? 0 points  What time? 0 points  Count back from 20 0 points  Months in reverse 0 points  Repeat phrase 4 points  Total Score 4    Immunizations Immunization History  Administered Date(s) Administered   Influenza Split 04/29/2012   Influenza Whole 05/05/2009   Influenza, High Dose Seasonal PF 05/13/2013, 04/19/2019   Influenza-Unspecified 05/03/2014, 05/26/2015, 04/16/2016, 04/22/2017   PFIZER(Purple Top)SARS-COV-2 Vaccination 08/27/2019, 09/17/2019   Pneumococcal Conjugate-13 06/05/2015   Pneumococcal Polysaccharide-23 01/24/2010, 08/28/2016   Td 08/05/2005   Tdap 09/03/2017   Zoster Recombinat (Shingrix) 03/22/2017, 07/22/2017   Zoster, Live 05/28/2013    TDAP status: Up to date  Flu Vaccine status: Due, Education has been provided regarding the importance of this vaccine. Advised may receive this vaccine at local pharmacy or Health Dept. Aware to provide a copy of the vaccination record if obtained from local pharmacy or Health Dept. Verbalized acceptance and understanding.  Pneumococcal vaccine status: Up to date  Covid-19 vaccine status: Completed vaccines  Qualifies for Shingles Vaccine? Yes   Zostavax completed Yes   Shingrix Completed?: Yes  Screening Tests Health Maintenance  Topic Date Due   COVID-19 Vaccine (3 - Pfizer risk series) 10/15/2019   INFLUENZA VACCINE  11/02/2021 (Originally 03/05/2021)   TETANUS/TDAP  09/04/2027   Pneumonia Vaccine 81+ Years old  Completed   Hepatitis C Screening  Completed   Zoster Vaccines- Shingrix  Completed   HPV VACCINES  Aged Out   COLONOSCOPY (Pts 45-65yrs Insurance coverage will need to be confirmed)  Discontinued    Health Maintenance  Health Maintenance Due  Topic Date Due   COVID-19 Vaccine (3 - Pfizer risk series) 10/15/2019    Colorectal cancer screening: No longer required.  Additional Screening:  Hepatitis C Screening: Completed 09/20/19  Vision Screening: Recommended  annual ophthalmology exams for early detection of glaucoma and other disorders of the eye. Is the patient up to date with their annual eye exam?  Yes  Who is the provider or what is the name of the office in which the patient attends annual eye exams? Unsure of providers name If pt is not established with a provider, would they like to be referred to a provider to establish care? No .   Dental Screening: Recommended annual dental exams for proper oral hygiene  Community Resource Referral / Chronic Care Management: CRR required this visit?  No   CCM required this visit?  No      Plan:     I have personally reviewed and noted the following in the patient's chart:   Medical and social history Use of alcohol, tobacco or illicit drugs  Current medications and supplements including opioid prescriptions. Patient is not currently taking opioid prescriptions. Functional ability and status Nutritional status Physical activity Advanced directives List of other physicians Hospitalizations, surgeries, and ER visits in previous 12 months Vitals Screenings to include cognitive, depression, and falls Referrals and appointments  In addition, I have reviewed and discussed with patient certain preventive protocols, quality metrics, and best practice recommendations. A written personalized care plan for preventive services as well as general preventive health recommendations were provided to patient.     Willette Brace, LPN   20/10/5595   Nurse Notes: None

## 2021-07-06 NOTE — Patient Instructions (Addendum)
Ryan [name] , Thank you for taking time to come for your Medicare Wellness Visit. I appreciate your ongoing commitment to your health goals. Please review the following plan we discussed and let me know if I can assist you in the future.   Screening recommendations/referrals: Colonoscopy: No longer required  Recommended yearly ophthalmology/optometry visit for glaucoma screening and checkup Recommended yearly dental visit for hygiene and checkup  Vaccinations: Influenza vaccine: Postponed until 3/23 Pneumococcal vaccine: Up to date Tdap vaccine: Done 09/03/17 repeat every 10 years  Shingles vaccine: Completed 03/22/17 & 07/22/17   Covid-19: Completed 1/22 & 09/17/19  Advanced directives: Copies in chart  Conditions/risks identified: Get back to exercise   Next appointment: Follow up in one year for your annual wellness visit.   Preventive Care 38 Years and Older, Male Preventive care refers to lifestyle choices and visits with your health care provider that can promote health and wellness. What does preventive care include? A yearly physical exam. This is also called an annual well check. Dental exams once or twice a year. Routine eye exams. Ask your health care provider how often you should have your eyes checked. Personal lifestyle choices, including: Daily care of your teeth and gums. Regular physical activity. Eating a healthy diet. Avoiding tobacco and drug use. Limiting alcohol use. Practicing safe sex. Taking low doses of aspirin every day. Taking vitamin and mineral supplements as recommended by your health care provider. What happens during an annual well check? The services and screenings done by your health care provider during your annual well check will depend on your age, overall health, lifestyle risk factors, and family history of disease. Counseling  Your health care provider may ask you questions about your: Alcohol use. Tobacco use. Drug use. Emotional  well-being. Home and relationship well-being. Sexual activity. Eating habits. History of falls. Memory and ability to understand (cognition). Work and work Statistician. Screening  You may have the following tests or measurements: Height, weight, and BMI. Blood pressure. Lipid and cholesterol levels. These may be checked every 5 years, or more frequently if you are over 76 years old. Skin check. Lung cancer screening. You may have this screening every year starting at age 76 if you have a 30-pack-year history of smoking and currently smoke or have quit within the past 15 years. Fecal occult blood test (FOBT) of the stool. You may have this test every year starting at age 76. Flexible sigmoidoscopy or colonoscopy. You may have a sigmoidoscopy every 5 years or a colonoscopy every 10 years starting at age 76. Prostate cancer screening. Recommendations will vary depending on your family history and other risks. Hepatitis C blood test. Hepatitis B blood test. Sexually transmitted disease (STD) testing. Diabetes screening. This is done by checking your blood sugar (glucose) after you have not eaten for a while (fasting). You may have this done every 1-3 years. Abdominal aortic aneurysm (AAA) screening. You may need this if you are a current or former smoker. Osteoporosis. You may be screened starting at age 76 if you are at high risk. Talk with your health care provider about your test results, treatment options, and if necessary, the need for more tests. Vaccines  Your health care provider may recommend certain vaccines, such as: Influenza vaccine. This is recommended every year. Tetanus, diphtheria, and acellular pertussis (Tdap, Td) vaccine. You may need a Td booster every 10 years. Zoster vaccine. You may need this after age 76. Pneumococcal 13-valent conjugate (PCV13) vaccine. One dose is recommended after  age 76. Pneumococcal polysaccharide (PPSV23) vaccine. One dose is recommended after  age 76. Talk to your health care provider about which screenings and vaccines you need and how often you need them. This information is not intended to replace advice given to you by your health care provider. Make sure you discuss any questions you have with your health care provider. Document Released: 08/18/2015 Document Revised: 04/10/2016 Document Reviewed: 05/23/2015 Elsevier Interactive Patient Education  2017 [employer] Prevention in the Home Falls can cause injuries. They can happen to people of all ages. There are many things you can do to make your home safe and to help prevent falls. What can I do on the outside of my home? Regularly fix the edges of walkways and driveways and fix any cracks. Remove anything that might make you trip as you walk through a door, such as a raised step or threshold. Trim any bushes or trees on the path to your home. Use bright outdoor lighting. Clear any walking paths of anything that might make someone trip, such as rocks or tools. Regularly check to see if handrails are loose or broken. Make sure that both sides of any steps have handrails. Any raised decks and porches should have guardrails on the edges. Have any leaves, snow, or ice cleared regularly. Use sand or salt on walking paths during winter. Clean up any spills in your garage right away. This includes oil or grease spills. What can I do in the bathroom? Use night lights. Install grab bars by the toilet and in the tub and shower. Do not use towel bars as grab bars. Use non-skid mats or decals in the tub or shower. If you need to sit down in the shower, use a plastic, non-slip stool. Keep the floor dry. Clean up any water that spills on the floor as soon as it happens. Remove soap buildup in the tub or shower regularly. Attach bath mats securely with double-sided non-slip rug tape. Do not have throw rugs and other things on the floor that can make you trip. What can I do in the  bedroom? Use night lights. Make sure that you have a light by your bed that is easy to reach. Do not use any sheets or blankets that are too big for your bed. They should not hang down onto the floor. Have a firm chair that has side arms. You can use this for support while you get dressed. Do not have throw rugs and other things on the floor that can make you trip. What can I do in the kitchen? Clean up any spills right away. Avoid walking on wet floors. Keep items that you use a lot in easy-to-reach places. If you need to reach something above you, use a strong step stool that has a grab bar. Keep electrical cords out of the way. Do not use floor polish or wax that makes floors slippery. If you must use wax, use non-skid floor wax. Do not have throw rugs and other things on the floor that can make you trip. What can I do with my stairs? Do not leave any items on the stairs. Make sure that there are handrails on both sides of the stairs and use them. Fix handrails that are broken or loose. Make sure that handrails are as long as the stairways. Check any carpeting to make sure that it is firmly attached to the stairs. Fix any carpet that is loose or worn. Avoid having throw rugs  at the top or bottom of the stairs. If you do have throw rugs, attach them to the floor with carpet tape. Make sure that you have a light switch at the top of the stairs and the bottom of the stairs. If you do not have them, ask someone to add them for you. What else can I do to help prevent falls? Wear shoes that: Do not have high heels. Have rubber bottoms. Are comfortable and fit you well. Are closed at the toe. Do not wear sandals. If you use a stepladder: Make sure that it is fully opened. Do not climb a closed stepladder. Make sure that both sides of the stepladder are locked into place. Ask someone to hold it for you, if possible. Clearly mark and make sure that you can see: Any grab bars or  handrails. First and last steps. Where the edge of each step is. Use tools that help you move around (mobility aids) if they are needed. These include: Canes. Walkers. Scooters. Crutches. Turn on the lights when you go into a dark area. Replace any light bulbs as soon as they burn out. Set up your furniture so you have a clear path. Avoid moving your furniture around. If any of your floors are uneven, fix them. If there are any pets around you, be aware of where they are. Review your medicines with your doctor. Some medicines can make you feel dizzy. This can increase your chance of falling. Ask your doctor what other things that you can do to help prevent falls. This information is not intended to replace advice given to you by your health care provider. Make sure you discuss any questions you have with your health care provider. Document Released: 05/18/2009 Document Revised: 12/28/2015 Document Reviewed: 08/26/2014 Elsevier Interactive Patient Education  2017 [employer].

## 2021-08-14 ENCOUNTER — Other Ambulatory Visit: Payer: Self-pay | Admitting: Neurosurgery

## 2021-08-14 DIAGNOSIS — M5416 Radiculopathy, lumbar region: Secondary | ICD-10-CM

## 2021-08-20 NOTE — Progress Notes (Signed)
Chronic Care Management Pharmacy Note  09/04/2021 Name:  Ryan [name] MRN:  [id number] DOB:  [birth year]  Summary: PharmD f/u.  Recently started gabapentin from neuro.  Takin 316m hs and is working for a short duration to get him to sleep.  Waking up with pain.  Also having some swelling in hands since started taking.  He is having upcoming MRI and requests something for nerves.  Recommendations/Changes made from today's visit: Possible daytime dose of gabapentin - consult neuro Await MRI results for additional treamtnet  Plan: FU 6 months   Subjective: Ryan [name] an 76y.o. year old male who is a primary patient of PVivi Barrack MD.  The CCM team was consulted for assistance with disease management and care coordination needs.    Engaged with patient face to face for follow up visit in response to provider referral for pharmacy case management and/or care coordination services.   Consent to Services:  The patient was given the following information about Chronic Care Management services today, agreed to services, and gave verbal consent: 1. CCM service includes personalized support from designated clinical staff supervised by the primary care provider, including individualized plan of care and coordination with other care providers 2. 24/7 contact phone numbers for assistance for urgent and routine care needs. 3. Service will only be billed when office clinical staff spend 20 minutes or more in a month to coordinate care. 4. Only one practitioner may furnish and bill the service in a calendar month. 5.The patient may stop CCM services at any time (effective at the end of the month) by phone call to the office staff. 6. The patient will be responsible for cost sharing (co-pay) of up to 20% of the service fee (after annual deductible is met). Patient agreed to services and consent obtained.  Patient Care Team: PVivi Barrack MD as PCP - General (Family Medicine) CSherren Mocha MD as PCP - Cardiology (Cardiology) DEdythe Clarity [facility] (Pharmacist)  Recent office visits:  04/26/2021 (VV) WKennyth Arnold FNP; Covid-19, Paxlovid and Hycodan as directed.   05/03/2021 (VV) WInda Coke PUtah Concern for possible early pneumonia, Ventolin HFA and Prednisone as directed.   Recent consult visits:  03/06/2021 OV (cardiology) CSherren Mocha MD; CAD, no medication changes indicated.   Hospital visits:  None in previous 6 months   Objective:  Lab Results  Component Value Date   CREATININE 0.86 02/26/2021   BUN 17 02/26/2021   GFR 84.18 02/26/2021   GFRNONAA >60 03/05/2017   GFRAA >60 03/05/2017   NA 140 02/26/2021   K 5.0 02/26/2021   CALCIUM 9.3 02/26/2021   CO2 29 02/26/2021   GLUCOSE 88 02/26/2021    Lab Results  Component Value Date/Time   HGBA1C 5.9 02/26/2021 10:45 AM   HGBA1C 6.1 10/02/2020 09:11 AM   GFR 84.18 02/26/2021 10:45 AM   GFR 81.10 10/02/2020 09:11 AM    Last diabetic Eye exam: No results found for: HMDIABEYEEXA  Last diabetic Foot exam: No results found for: HMDIABFOOTEX   Lab Results  Component Value Date   CHOL 134 10/02/2020   HDL 52.00 10/02/2020   LDLCALC 69 10/02/2020   LDLDIRECT 144.0 05/13/2013   TRIG 61.0 10/02/2020   CHOLHDL 3 10/02/2020    Hepatic Function Latest Ref Rng & Units 02/26/2021 10/02/2020 03/28/2020  Total Protein 6.0 - 8.3 g/dL 5.8(L) 6.2 5.8(L)  Albumin 3.5 - 5.2 g/dL 3.8 4.0 -  AST 0 - 37  U/L _0 ALT 0 - 53 U/L 26 38 25  Alk Phosphatase 39 - 117 U/L 84 67 -  Total Bilirubin 0.2 - 1.2 mg/dL 0.8 0.8 0.9  Bilirubin, Direct 0.0 - 0.3 mg/dL - - -    Lab Results  Component Value Date/Time   TSH 4.21 02/26/2021 10:45 AM   TSH 2.69 10/02/2020 09:11 AM    CBC Latest Ref Rng & Units 02/26/2021 10/02/2020 03/28/2020  WBC 4.0 - 10.5 K/uL 5.4 9.3 7.4  Hemoglobin 13.0 - 17.0 g/dL 14.0 15.3 14.1  Hematocrit 39.0 - 52.0 % 42.2 45.5 42.9  Platelets 150.0 - 400.0 K/uL 238.0  271.0 275    No results found for: VD25OH  Clinical ASCVD: Yes  The 10-year ASCVD risk score (Arnett DK, et al., 2019) is: 29.9%   Values used to calculate the score:     Age: 76 years     Sex: Male     Is Non-Hispanic African American: No     Diabetic: No     Tobacco smoker: No     Systolic Blood Pressure: 182 mmHg     Is BP treated: Yes     HDL Cholesterol: 52 mg/dL     Total Cholesterol: 134 mg/dL    Depression screen [facility] 2/9 07/06/2021 09/20/2019 07/13/2018  Decreased Interest 0 0 0  Down, Depressed, Hopeless 0 0 0  PHQ - 2 Score 0 0 0     Social History   Tobacco Use  Smoking Status Never  Smokeless Tobacco Never   BP Readings from Last 3 Encounters:  03/06/21 138/74  10/02/20 136/82  03/28/20 124/76   Pulse Readings from Last 3 Encounters:  03/06/21 (!) 56  10/02/20 (!) 59  03/28/20 (!) 53   Wt Readings from Last 3 Encounters:  05/03/21 210 lb (95.3 kg)  04/26/21 213 lb 3 oz (96.7 kg)  03/06/21 213 lb 3.2 oz (96.7 kg)   BMI Readings from Last 3 Encounters:  05/03/21 28.48 kg/m  04/26/21 28.91 kg/m  03/06/21 28.92 kg/m    Assessment/Interventions: Review of patient past medical history, allergies, medications, health status, including review of consultants reports, laboratory and other test data, was performed as part of comprehensive evaluation and provision of chronic care management services.   SDOH:  (Social Determinants of Health) assessments and interventions performed: Yes  Financial Resource Strain: Low Risk    Difficulty of Paying Living Expenses: Not hard at all    SDOH Screenings   Alcohol Screen: Not on file  Depression (PHQ2-9): Low Risk    PHQ-2 Score: 0  Financial Resource Strain: Low Risk    Difficulty of Paying Living Expenses: Not hard at all  Food Insecurity: No Food Insecurity   Worried About Charity fundraiser in the Last Year: Never true   Ran Out of Food in the Last Year: Never true  Housing: Low Risk    Last Housing  Risk Score: 0  Physical Activity: Inactive   Days of Exercise per Week: 0 days   Minutes of Exercise per Session: 0 min  Social Connections: Moderately Integrated   Frequency of Communication with Friends and Family: More than three times a week   Frequency of Social Gatherings with Friends and Family: More than three times a week   Attends Religious Services: More than 4 times per year   Active Member of [employer] or Organizations: No   Attends Archivist Meetings: Never   Marital Status: Married  Stress:  No Stress Concern Present   Feeling of Stress : Not at all  Tobacco Use: Low Risk    Smoking Tobacco Use: Never   Smokeless Tobacco Use: Never   Passive Exposure: Not on file  Transportation Needs: No Transportation Needs   Lack of Transportation (Medical): No   Lack of Transportation (Non-Medical): No    CCM Care Plan  Allergies  Allergen Reactions   Contrast Media [Iodinated Contrast Media] Shortness Of Breath and Swelling    Swelling of the throat    Erythromycin Nausea And Vomiting   Niacin And Related Swelling   Penicillins Hives    Has patient had a PCN reaction causing immediate rash, facial/tongue/throat swelling, SOB or lightheadedness with hypotension: Y Has patient had a PCN reaction causing severe rash involving mucus membranes or skin necrosis: No Has patient had a PCN reaction that required hospitalization: No Has patient had a PCN reaction occurring within the last 10 years: No If all of the above answers are "NO", then may proceed with Cephalosporin use.    Medications Reviewed Today     Reviewed by Edythe Clarity, [facility] (Pharmacist) on 09/03/21 at [place] List Status: <None>   Medication Order Taking? Sig Documenting Provider Last Dose Status Informant  acetaminophen (TYLENOL) 325 MG tablet [id number] Yes Take 325-650 mg by mouth every 6 (six) hours as needed for mild pain or moderate pain. [provider] Taking Active Self  albuterol  (VENTOLIN HFA) 108 (90 Base) MCG/ACT inhaler [id number] Yes Inhale 2 puffs into the lungs every 4 (four) hours as needed for wheezing. [name], [place] Taking Active   aspirin EC 81 MG EC tablet [id number] Yes Take 1 tablet (81 mg total) by mouth daily. Delos Haring, PA-C Taking Active Self  atorvastatin (LIPITOR) 80 MG tablet [id number] Yes TAKE 1 TABLET BY MOUTH EVERY DAY Vivi Barrack, MD Taking Active   bisacodyl (DULCOLAX) 5 MG EC tablet [id number] Yes Take 5 mg by mouth daily as needed for moderate constipation. [provider] Taking Active Self  co-enzyme Q-10 30 MG capsule [id number] Yes Take 30 mg by mouth daily.  [provider] Taking Active   doxycycline (VIBRA-TABS) 100 MG tablet [id number] Yes Take 1 tablet (100 mg total) by mouth 2 (two) times daily. [name], [place] Taking Active   fluorouracil (EFUDEX) 5 % cream [id number] Yes Apply 1 application topically as needed.  [provider] Taking Active   HYDROcodone bit-homatropine (HYCODAN) 5-1.5 MG/5ML syrup [id number] Yes Take 5 mLs by mouth every 8 (eight) hours as needed for cough. Dutch Quint B, FNP Taking Active   Melatonin 5 MG TABS [id number] Yes Take 5 mg by mouth at bedtime as needed (sleep). [provider] Taking Active   Multiple Vitamins-Minerals (CENTRUM ADULTS PO) [id number] Yes Take 1 tablet by mouth daily. [provider] Taking Active   nitroGLYCERIN (NITROSTAT) 0.4 MG SL tablet [id number] Yes Place 1 tablet (0.4 mg total) under the tongue every 5 (five) minutes as needed for chest pain. Sherren Mocha, MD Taking Active   pantoprazole (PROTONIX) 20 MG tablet [id number] Yes Take 1 tablet (20 mg total) by mouth daily. Vivi Barrack, MD Taking Active   polyethylene glycol powder [facility]) powder [id number] Yes Take 17 g by mouth as needed for mild constipation.  [provider] Taking Active   predniSONE (DELTASONE) 50 MG tablet [id number] Yes Take 1 tablet  daily [name], [place] Taking Active   SYNTHROID 88 MCG tablet [id number] Yes Take  1 tablet (88 mcg total) by mouth daily. Vivi Barrack, MD Taking Active   tamsulosin [facility]) 0.4 MG CAPS capsule [id number] Yes TAKE 2 CAPSULES BY MOUTH EVERY DAY Vivi Barrack, MD Taking Active             Patient Active Problem List   Diagnosis Date Noted   GERD (gastroesophageal reflux disease) 09/14/2018   Asthma 09/14/2018   Spinal stenosis of lumbar region with neurogenic claudication 08/13/2017   CAD (coronary artery disease) 12/17/2016   Hypercholesterolemia 12/10/2016   History of skin cancer 08/28/2016   BPH associated with nocturia 05/17/2014   Hypothyroidism 03/11/2007    Immunization History  Administered Date(s) Administered   Influenza Split 04/29/2012   Influenza Whole 05/05/2009   Influenza, High Dose Seasonal PF 05/13/2013, 04/19/2019   Influenza-Unspecified 05/03/2014, 05/26/2015, 04/16/2016, 04/22/2017   PFIZER(Purple Top)SARS-COV-2 Vaccination 08/27/2019, 09/17/2019   Pneumococcal Conjugate-13 06/05/2015   Pneumococcal Polysaccharide-23 01/24/2010, 08/28/2016   Td 08/05/2005   Tdap 09/03/2017   Zoster Recombinat (Shingrix) 03/22/2017, 07/22/2017   Zoster, Live 05/28/2013    Conditions to be addressed/monitored:  CAD, Asthma, GERD, Hypothyroidism, Hypercholesterolemia, Spinal Stenosis  Care Plan : General Pharmacy (Adult)  Updates made by Edythe Clarity, RPH since 09/04/2021 12:00 AM     Problem: CAD, Asthma, GERD, Hypothyroidism, Hypercholesterolemia, Spinal Stenosis   Priority: High  Onset Date: 02/26/2021     Long-Range Goal: Patient-Specific Goal   Start Date: 02/26/2021  Expected End Date: 08/29/2021  Recent Progress: On track  Priority: High  Note:   Current Barriers:  Unable to achieve control of back pain   Pharmacist Clinical Goal(s):  Patient will achieve adherence to monitoring guidelines and medication adherence to achieve therapeutic  efficacy adhere to prescribed medication regimen as evidenced by pill box contact provider office for questions/concerns as evidenced notation of same in electronic health record through collaboration with PharmD and provider.   Interventions: 1:1 collaboration with Vivi Barrack, MD regarding development and update of comprehensive plan of care as evidenced by provider attestation and co-signature Inter-disciplinary care team collaboration (see longitudinal plan of care) Comprehensive medication review performed; medication list updated in electronic medical record  Hyperlipidemia/CAD: (LDL goal < 70) -Controlled -Current treatment: Atorvastatin 56m daily ASA 835mdaily -Medications previously tried: none noted  -Current exercise habits: golfs, exercise is limited due to back pain -Educated on Cholesterol goals;  Benefits of statin for ASCVD risk reduction; Importance of limiting foods high in cholesterol; Strategies to manage statin-induced myalgias; -He is getting blood work today for updated lipid panel, most recent LDL well controlled  -He is adherent with meds and reports no myalgias -Counseled on diet and exercise extensively Recommended to continue current medication  GERD (Goal: Control Symptoms) -Controlled -Current treatment  Pantoprazole 2049maily - takes at night -Medications previously tried: OTC tums, etc. -Patient has severe symptoms when not taking this medication. -Takes appropriately at night because his symptoms used to wake him up.  -Recommended to continue current medication  Spinal Stenosis (Goal: Reduce pain/improve QOL) -Not ideally controlled -Current treatment  Tylenol prn Injections from neuro - steroid Gabapentin 300m22m Appropriate, Query effective,  -Medications previously tried: none noted  -Has history of taking large quantities of NSAIDs for relief.  Discussed other alternatives for pain relief such as Tramadol to take prn.  Kidney  function is still stable.  He sees neuro for pain management. -Recommended to continue current medication Recommended he discuss possible tramadol prn with neuro at next visit.  Update 09/03/21 He is still having pretty severe back pain. Recently started taking gabapentin 356m hs about three weeks ago.  This is helping some, however it is not lasting all night. He also mentions he is experiencing some swelling in his hands after starting the medication. Discussed taking up to one month to reach steady state. He is still having daytime pain - plans to discuss daytime dose with neuro. He also has pending MRI - wishes to have Xanax prescribed due to previous experiences. Continue current meds for now - consider day time dose of gabapentin (even 1073m to see if he can tolerate. Will FU after MRI  Hypothyroidism (Goal: Maintain TSH) -Controlled -Current treatment  Synthroid 88101mdaily Appropriate, Effective, Safe, Accessible -Medications previously tried: none noted -Takes appropriately on empty stomach 30-60 mins before food and drink -TSH is WNL  -Recommended to continue current medication  Update 09/03/21 Patient inquires about Synthroid dose and lack of energy. Most recent TSH is WNL but it is trending up. Recommend recheck TSH to see if still WNL. No changes at this time, adjust dose if needed based on labs.  Asthma (Goal: Minimze symptoms) -Controlled -Current treatment  Albuterol HFA 90 mcg daily -Medications previously tried: none noted -Patient rarely has to use inhaler  -Recommended to continue current medication  Patient Goals/Self-Care Activities Patient will:  - take medications as prescribed check blood pressure periodically, document, and provide at future appointments collaborate with provider on medication access solutions  Follow Up Plan: The care management team will reach out to the patient again over the next 180 days.           Medication  Assistance: None required.  Patient affirms current coverage meets needs.  Compliance/Adherence/Medication fill history: Care Gaps: None noted  Star-Rating Drugs: Atorvastatin 80 mg once daily  Patient's preferred pharmacy is:  [id number] [place] [facility] [address]: [phone]: [id number] [place] [facility] [place]: [phone]/pharmacy #[id number] [address]: [phone] pill box? Yes Pt endorses 100% compliance  We discussed: Benefits of medication synchronization, packaging and delivery as well as enhanced pharmacist oversight with [employer]. Patient decided to: Continue current medication management strategy  Care Plan and Follow Up Patient Decision:  Patient agrees to Care Plan and Follow-up.  Plan: The care management team will reach out to the patient again over the next 180 days.  ChristBeverly MilchmD Clinical Pharmacist [phone]

## 2021-09-02 ENCOUNTER — Other Ambulatory Visit: Payer: Self-pay | Admitting: Family Medicine

## 2021-09-03 ENCOUNTER — Ambulatory Visit (INDEPENDENT_AMBULATORY_CARE_PROVIDER_SITE_OTHER): Payer: Medicare Other | Admitting: Pharmacist

## 2021-09-03 DIAGNOSIS — E039 Hypothyroidism, unspecified: Secondary | ICD-10-CM

## 2021-09-03 DIAGNOSIS — M48062 Spinal stenosis, lumbar region with neurogenic claudication: Secondary | ICD-10-CM

## 2021-09-03 NOTE — Patient Instructions (Addendum)
Visit Information   Goals Addressed             This Visit's Progress    Keep Low Back Pain Under Control   On track    Timeframe:  Long-Range Goal Priority:  High Start Date: 02/26/21                            Expected End Date: 08/29/21                      Follow Up Date 06/04/21    - call for medicine refill 2 or 3 days before it runs out - develop a personal pain management plan - plan exercise or activity when pain is best controlled - stay active - track times pain is worst and when it is best - track what makes the pain worse and what makes it better    Why is this important?   Day-to-day life can be hard when you have back pain.  Pain medicine is just one piece of the treatment puzzle. There are many things you can do to manage pain and keep your back strong.   Lifestyle changes, like stopping smoking and eating foods with Vitamin D and calcium, keep your bones and muscles healthy. Your back is better when it is supported by strong muscles.  You can try these action steps to help you manage your pain.     Notes:        Patient Care Plan: General Pharmacy (Adult)     Problem Identified: CAD, Asthma, GERD, Hypothyroidism, Hypercholesterolemia, Spinal Stenosis   Priority: High  Onset Date: 02/26/2021     Long-Range Goal: Patient-Specific Goal   Start Date: 02/26/2021  Expected End Date: 08/29/2021  Recent Progress: On track  Priority: High  Note:   Current Barriers:  Unable to achieve control of back pain   Pharmacist Clinical Goal(s):  Patient will achieve adherence to monitoring guidelines and medication adherence to achieve therapeutic efficacy adhere to prescribed medication regimen as evidenced by pill box contact provider office for questions/concerns as evidenced notation of same in electronic health record through collaboration with PharmD and provider.   Interventions: 1:1 collaboration with Vivi Barrack, MD regarding development and update of  comprehensive plan of care as evidenced by provider attestation and co-signature Inter-disciplinary care team collaboration (see longitudinal plan of care) Comprehensive medication review performed; medication list updated in electronic medical record  Hyperlipidemia/CAD: (LDL goal < 70) -Controlled -Current treatment: Atorvastatin 80mg  daily ASA 81mg  daily -Medications previously tried: none noted  -Current exercise habits: golfs, exercise is limited due to back pain -Educated on Cholesterol goals;  Benefits of statin for ASCVD risk reduction; Importance of limiting foods high in cholesterol; Strategies to manage statin-induced myalgias; -He is getting blood work today for updated lipid panel, most recent LDL well controlled  -He is adherent with meds and reports no myalgias -Counseled on diet and exercise extensively Recommended to continue current medication  GERD (Goal: Control Symptoms) -Controlled -Current treatment  Pantoprazole 20mg  daily - takes at night -Medications previously tried: OTC tums, etc. -Patient has severe symptoms when not taking this medication. -Takes appropriately at night because his symptoms used to wake him up.  -Recommended to continue current medication  Spinal Stenosis (Goal: Reduce pain/improve QOL) -Not ideally controlled -Current treatment  Tylenol prn Injections from neuro - steroid Gabapentin 300mg  qd Appropriate, Query effective,  -Medications previously tried: none  noted  -Has history of taking large quantities of NSAIDs for relief.  Discussed other alternatives for pain relief such as Tramadol to take prn.  Kidney function is still stable.  He sees neuro for pain management. -Recommended to continue current medication Recommended he discuss possible tramadol prn with neuro at next visit.  Update 09/03/21 He is still having pretty severe back pain. Recently started taking gabapentin 300mg  hs about three weeks ago.  This is helping some,  however it is not lasting all night. He also mentions he is experiencing some swelling in his hands after starting the medication. Discussed taking up to one month to reach steady state. He is still having daytime pain - plans to discuss daytime dose with neuro. He also has pending MRI - wishes to have Xanax prescribed due to previous experiences. Continue current meds for now - consider day time dose of gabapentin (even 100mg ) to see if he can tolerate. Will FU after MRI  Hypothyroidism (Goal: Maintain TSH) -Controlled -Current treatment  Synthroid 41mcg daily Appropriate, Effective, Safe, Accessible -Medications previously tried: none noted -Takes appropriately on empty stomach 30-60 mins before food and drink -TSH is WNL  -Recommended to continue current medication  Update 09/03/21 Patient inquires about Synthroid dose and lack of energy. Most recent TSH is WNL but it is trending up. Recommend recheck TSH to see if still WNL. No changes at this time, adjust dose if needed based on labs.  Asthma (Goal: Minimze symptoms) -Controlled -Current treatment  Albuterol HFA 90 mcg daily -Medications previously tried: none noted -Patient rarely has to use inhaler  -Recommended to continue current medication  Patient Goals/Self-Care Activities Patient will:  - take medications as prescribed check blood pressure periodically, document, and provide at future appointments collaborate with provider on medication access solutions  Follow Up Plan: The care management team will reach out to the patient again over the next 180 days.          Patient verbalizes understanding of instructions and care plan provided today and agrees to view in [place]. Active MyChart status confirmed with patient.   Telephone follow up appointment with pharmacy team member scheduled for: 6 months  Edythe Clarity, [place], PharmD Clinical Pharmacist  [facility] [phone]

## 2021-09-04 ENCOUNTER — Encounter: Payer: Self-pay | Admitting: Family Medicine

## 2021-09-04 ENCOUNTER — Ambulatory Visit (INDEPENDENT_AMBULATORY_CARE_PROVIDER_SITE_OTHER): Payer: Medicare Other | Admitting: Family Medicine

## 2021-09-04 ENCOUNTER — Other Ambulatory Visit: Payer: Self-pay

## 2021-09-04 VITALS — BP 136/76 | HR 55 | Temp 97.6°F | Ht 71.65 in | Wt 220.6 lb

## 2021-09-04 DIAGNOSIS — E039 Hypothyroidism, unspecified: Secondary | ICD-10-CM

## 2021-09-04 DIAGNOSIS — R351 Nocturia: Secondary | ICD-10-CM

## 2021-09-04 DIAGNOSIS — R739 Hyperglycemia, unspecified: Secondary | ICD-10-CM

## 2021-09-04 DIAGNOSIS — E7800 Pure hypercholesterolemia, unspecified: Secondary | ICD-10-CM

## 2021-09-04 DIAGNOSIS — K219 Gastro-esophageal reflux disease without esophagitis: Secondary | ICD-10-CM

## 2021-09-04 DIAGNOSIS — N401 Enlarged prostate with lower urinary tract symptoms: Secondary | ICD-10-CM

## 2021-09-04 DIAGNOSIS — J45909 Unspecified asthma, uncomplicated: Secondary | ICD-10-CM | POA: Diagnosis not present

## 2021-09-04 DIAGNOSIS — M48062 Spinal stenosis, lumbar region with neurogenic claudication: Secondary | ICD-10-CM | POA: Diagnosis not present

## 2021-09-04 DIAGNOSIS — Z6830 Body mass index (BMI) 30.0-30.9, adult: Secondary | ICD-10-CM

## 2021-09-04 DIAGNOSIS — E669 Obesity, unspecified: Secondary | ICD-10-CM

## 2021-09-04 DIAGNOSIS — E785 Hyperlipidemia, unspecified: Secondary | ICD-10-CM

## 2021-09-04 DIAGNOSIS — J4520 Mild intermittent asthma, uncomplicated: Secondary | ICD-10-CM

## 2021-09-04 DIAGNOSIS — I2510 Atherosclerotic heart disease of native coronary artery without angina pectoris: Secondary | ICD-10-CM | POA: Diagnosis not present

## 2021-09-04 LAB — CBC
HCT: 44.9 % (ref 39.0–52.0)
Hemoglobin: 14.7 g/dL (ref 13.0–17.0)
MCHC: 32.6 g/dL (ref 30.0–36.0)
MCV: 93.9 fl (ref 78.0–100.0)
Platelets: 225 10*3/uL (ref 150.0–400.0)
RBC: 4.78 Mil/uL (ref 4.22–5.81)
RDW: 13 % (ref 11.5–15.5)
WBC: 6.4 10*3/uL (ref 4.0–10.5)

## 2021-09-04 LAB — COMPREHENSIVE METABOLIC PANEL
ALT: 31 U/L (ref 0–53)
AST: 22 U/L (ref 0–37)
Albumin: 4 g/dL (ref 3.5–5.2)
Alkaline Phosphatase: 109 U/L (ref 39–117)
BUN: 15 mg/dL (ref 6–23)
CO2: 30 mEq/L (ref 19–32)
Calcium: 9.3 mg/dL (ref 8.4–10.5)
Chloride: 103 mEq/L (ref 96–112)
Creatinine, Ser: 0.94 mg/dL (ref 0.40–1.50)
GFR: 78.52 mL/min (ref >60.00)
Glucose, Bld: 87 mg/dL (ref 70–99)
Potassium: 4.2 mEq/L (ref 3.5–5.1)
Sodium: 139 mEq/L (ref 135–145)
Total Bilirubin: 0.7 mg/dL (ref 0.2–1.2)
Total Protein: 6 g/dL (ref 6.0–8.3)

## 2021-09-04 LAB — LIPID PANEL
Cholesterol: 115 mg/dL (ref 0–200)
HDL: 38 mg/dL — ABNORMAL LOW (ref >39.00)
LDL Cholesterol: 64 mg/dL (ref 0–99)
NonHDL: 76.75
Total CHOL/HDL Ratio: 3
Triglycerides: 62 mg/dL (ref 0.0–149.0)
VLDL: 12.4 mg/dL (ref 0.0–40.0)

## 2021-09-04 LAB — TSH: TSH: 1.84 u[IU]/mL (ref 0.35–5.50)

## 2021-09-04 LAB — PSA: PSA: 2.82 ng/mL (ref 0.10–4.00)

## 2021-09-04 LAB — HEMOGLOBIN A1C: Hgb A1c MFr Bld: 5.7 % (ref 4.6–6.5)

## 2021-09-04 MED ORDER — ATORVASTATIN CALCIUM 80 MG PO TABS: 80.0000 mg | ORAL_TABLET | Freq: Every day | ORAL | 1 refills | Status: DC

## 2021-09-04 MED ORDER — DIAZEPAM 2 MG PO TABS: ORAL_TABLET | ORAL | 0 refills | Status: DC

## 2021-09-04 NOTE — Assessment & Plan Note (Signed)
On Synthroid 88 mcg daily.  We will check TSH.

## 2021-09-04 NOTE — Assessment & Plan Note (Signed)
Check labs.  He is on Lipitor 80 mg daily. 

## 2021-09-04 NOTE — Patient Instructions (Signed)
It was very nice to see you today!  We will check blood work today.  Please use the low-dose Valium prior to your MRI.  It is okay for you to take a full dose aspirin.  We flushed your ears today.   Please continue to work on diet and exercise.  I will see back in 1 year for your next physical.  Come back sooner if needed.  Take care, Dr [name]  PLEASE NOTE:  If you had any lab tests please let us know if you have not heard back within a few days. You may see your results on mychart before we have a chance to review them but we will give you a call once they are reviewed by [place]. If we ordered any referrals today, please let us know if you have not heard from their office within the next week.   Please try these tips to maintain a healthy lifestyle:  Eat at least 3 REAL meals and 1-2 snacks per day.  Aim for no more than 5 hours between eating.  If you eat breakfast, please do so within one hour of getting up.   Each meal should contain half fruits/vegetables, one quarter protein, and one quarter carbs (no bigger than a computer mouse)  Cut down on sweet beverages. This includes juice, soda, and sweet tea.   Drink at least 1 glass of water with each meal and aim for at least 8 glasses per day  Exercise at least 150 minutes every week.    Preventive Care 42 Years and Older, Male Preventive care refers to lifestyle choices and visits with your health care provider that can promote health and wellness. Preventive care visits are also called wellness exams. What can I expect for my preventive care visit? Counseling During your preventive care visit, your health care provider may ask about your: Medical history, including: Past medical problems. Family medical history. History of falls. Current health, including: Emotional well-being. Home life and relationship well-being. Sexual activity. Memory and ability to understand (cognition). Lifestyle, including: Alcohol, nicotine  or tobacco, and drug use. Access to firearms. Diet, exercise, and sleep habits. Work and work Statistician. Sunscreen use. Safety issues such as seatbelt and bike helmet use. Physical exam Your health care provider will check your: Height and weight. These may be used to calculate your BMI (body mass index). BMI is a measurement that tells if you are at a healthy weight. Waist circumference. This measures the distance around your waistline. This measurement also tells if you are at a healthy weight and may help predict your risk of certain diseases, such as type 2 diabetes and high blood pressure. Heart rate and blood pressure. Body temperature. Skin for abnormal spots. What immunizations do I need? Vaccines are usually given at various ages, according to a schedule. Your health care provider will recommend vaccines for you based on your age, medical history, and lifestyle or other factors, such as travel or where you work. What tests do I need? Screening Your health care provider may recommend screening tests for certain conditions. This may include: Lipid and cholesterol levels. Diabetes screening. This is done by checking your blood sugar (glucose) after you have not eaten for a while (fasting). Hepatitis C test. Hepatitis B test. HIV (human immunodeficiency virus) test. STI (sexually transmitted infection) testing, if you are at risk. Lung cancer screening. Colorectal cancer screening. Prostate cancer screening. Abdominal aortic aneurysm (AAA) screening. You may need this if you are a current or  former smoker. Talk with your health care provider about your test results, treatment options, and if necessary, the need for more tests. Follow these instructions at home: Eating and drinking  Eat a diet that includes fresh fruits and vegetables, whole grains, lean protein, and low-fat dairy products. Limit your intake of foods with high amounts of sugar, saturated fats, and salt. Take  vitamin and mineral supplements as recommended by your health care provider. Do not drink alcohol if your health care provider tells you not to drink. If you drink alcohol: Limit how much you have to 0-2 drinks a day. Know how much alcohol is in your drink. In the [place] one drink equals one 12 oz bottle of beer (355 mL), one 5 oz glass of wine (148 mL), or one 1 oz glass of hard liquor (44 mL). Lifestyle Brush your teeth every morning and night with fluoride toothpaste. Floss one time each day. Exercise for at least 30 minutes 5 or more days each week. Do not use any products that contain nicotine or tobacco. These products include cigarettes, chewing tobacco, and vaping devices, such as e-cigarettes. If you need help quitting, ask your health care provider. Do not use drugs. If you are sexually active, practice safe sex. Use a condom or other form of protection to prevent STIs. Take aspirin only as told by your health care provider. Make sure that you understand how much to take and what form to take. Work with your health care provider to find out whether it is safe and beneficial for you to take aspirin daily. Ask your health care provider if you need to take a cholesterol-lowering medicine (statin). Find healthy ways to manage stress, such as: Meditation, yoga, or listening to music. Journaling. Talking to a trusted person. Spending time with friends and family. Safety Always wear your seat belt while driving or riding in a vehicle. Do not drive: If you have been drinking alcohol. Do not ride with someone who has been drinking. When you are tired or distracted. While texting. If you have been using any mind-altering substances or drugs. Wear a helmet and other protective equipment during sports activities. If you have firearms in your house, make sure you follow all gun safety procedures. Minimize exposure to UV radiation to reduce your risk of skin cancer. What's next? Visit your  health care provider once a year for an annual wellness visit. Ask your health care provider how often you should have your eyes and teeth checked. Stay up to date on all vaccines. This information is not intended to replace advice given to you by your health care provider. Make sure you discuss any questions you have with your health care provider. Document Revised: 01/17/2021 Document Reviewed: 01/17/2021 Elsevier Patient Education  [place].

## 2021-09-04 NOTE — Assessment & Plan Note (Signed)
On aspirin and statin °

## 2021-09-04 NOTE — Assessment & Plan Note (Signed)
Stable.  Takes albuterol as needed.

## 2021-09-04 NOTE — Assessment & Plan Note (Signed)
Continue management per orthopedics. 

## 2021-09-04 NOTE — Assessment & Plan Note (Signed)
Continue Flomax 0.8 mg daily.  Check PSA.  He will let me know if he would like to have referral to urology.

## 2021-09-04 NOTE — Progress Notes (Signed)
Chief Complaint:  Ryan [name] is a 76 y.o. male who presents today for his annual comprehensive physical exam.    Assessment/Plan:  New/Acute Problems: Foreign Body in [place] to have hair and right EAC.  Successfully irrigated by CMA today.   Situational anxiety Has upcoming MRI.  Has issues with claustrophobia and anxiety around this.  We will send in low-dose Valium to use for his procedure.  Discussed potential side effects.  Chronic Problems Addressed Today: Hypothyroidism On Synthroid 88 mcg daily.  We will check TSH.  BPH associated with nocturia Continue Flomax 0.8 mg daily.  Check PSA.  He will let me know if he would like to have referral to urology.  Asthma Stable.  Takes albuterol as needed.  GERD (gastroesophageal reflux disease) Continue Protonix.  Spinal stenosis of lumbar region with neurogenic claudication Continue management per orthopedics.  CAD (coronary artery disease) On aspirin and statin.  Hypercholesterolemia Check labs.  He is on Lipitor 80 mg daily.  Preventative Healthcare: Will get blood work done today. UTD on vaccines and other screenings.  Patient Counseling(The following topics were reviewed and/or handout was given):  -Nutrition: Stressed importance of moderation in sodium/caffeine intake, saturated fat and cholesterol, caloric balance, sufficient intake of fresh fruits, vegetables, and fiber.  -Stressed the importance of regular exercise.   -Substance Abuse: Discussed cessation/primary prevention of tobacco, alcohol, or other drug use; driving or other dangerous activities under the influence; availability of treatment for abuse.   -Injury prevention: Discussed safety belts, safety helmets, smoke detector, smoking near bedding or upholstery.   -Sexuality: Discussed sexually transmitted diseases, partner selection, use of condoms, avoidance of unintended pregnancy and contraceptive alternatives.   -Dental health: Discussed  importance of regular tooth brushing, flossing, and dental visits.  -Health maintenance and immunizations reviewed. Please refer to Health maintenance section.  Return to care in 1 year for next preventative visit.     Subjective:  HPI:  He has no acute complaints today.   He has few issue he would like to discuss today.   He is still having issue with back. This is chronic issue for the past few years. He follows up with pain clinic and orthopedics for this. He had given about 8 injection for the pain in the past. Symptoms have been getting worse. He does have a hx of spinal stenosis.  He is also having issue with edema. Located on hands, arms and feet. He notes he noticed his right arm get puffy and tight after waking up in the morning. He thinks this could be due to Gabapentin. This is manageable. Denies any precipitating factors.   He would like his right ear be checked today. He has been hearing some "crackling" sound from the right ear. Denies pain or difficulty hearing.    Lifestyle Diet: None specific Exercise: None specific.  Depression screen PHQ 2/9 09/04/2021  Decreased Interest 0  Down, Depressed, Hopeless 0  PHQ - 2 Score 0    Health Maintenance Due  Topic Date Due   COVID-19 Vaccine (3 - Pfizer risk series) 10/15/2019     ROS: Per HPI, otherwise a complete review of systems was negative.   PMH:  The following were reviewed and entered/updated in epic: Past Medical History:  Diagnosis Date   Allergy    seasonal   Anxiety    Arthritis    Asthma    Cataract    Coronary artery disease 12/08/2016   STEMI, DES Circumflex  GERD (gastroesophageal reflux disease)    Hyperlipidemia    Hypothyroidism    STEMI (ST elevation myocardial infarction) ([place]) 12/08/2016   Patient Active Problem List   Diagnosis Date Noted   GERD (gastroesophageal reflux disease) 09/14/2018   Asthma 09/14/2018   Spinal stenosis of lumbar region with neurogenic claudication  08/13/2017   CAD (coronary artery disease) 12/17/2016   Hypercholesterolemia 12/10/2016   History of skin cancer 08/28/2016   BPH associated with nocturia 05/17/2014   Hypothyroidism 03/11/2007   Past Surgical History:  Procedure Laterality Date   cataract Bilateral 03/2016   COLONOSCOPY  10.27.2005   COLONOSCOPY  06/30/2018   epidural steroid shot     x 3 , lumbar spine area   LEFT HEART CATH AND CORONARY ANGIOGRAPHY N/A 12/08/2016   Procedure: Left Heart Cath and Coronary Angiography;  Surgeon: Sherren Mocha, MD;  Location: [facility];  Service: Cardiovascular;  Laterality: N/A;   POLYPECTOMY     SHOULDER SURGERY Right    Rotator Cuff Tear Repair   TONSILLECTOMY     UPPER GASTROINTESTINAL ENDOSCOPY      Family History  Problem Relation Age of Onset   Diabetes Mother    Heart failure Mother    Irritable bowel syndrome Mother    Diverticulosis Mother    Lung cancer Father        worked in Product/process development scientist yard   Heart disease Sister    Irritable bowel syndrome Sister    Diverticulosis Sister    Colon cancer Neg Hx    Esophageal cancer Neg Hx    Pancreatic cancer Neg Hx    Stomach cancer Neg Hx     Medications- reviewed and updated Current Outpatient Medications  Medication Sig Dispense Refill   acetaminophen (TYLENOL) 325 MG tablet Take 325-650 mg by mouth every 6 (six) hours as needed for mild pain or moderate pain.     albuterol (VENTOLIN HFA) 108 (90 Base) MCG/ACT inhaler Inhale 2 puffs into the lungs every 4 (four) hours as needed for wheezing. 1 each 2   aspirin EC 81 MG EC tablet Take 1 tablet (81 mg total) by mouth daily. 30 tablet 11   bisacodyl (DULCOLAX) 5 MG EC tablet Take 5 mg by mouth daily as needed for moderate constipation.     co-enzyme Q-10 30 MG capsule Take 30 mg by mouth daily.      diazepam (VALIUM) 2 MG tablet Take 30 minutes prior to procedure 2 tablet 0   fluorouracil (EFUDEX) 5 % cream Apply 1 application topically as needed.       HYDROcodone bit-homatropine (HYCODAN) 5-1.5 MG/5ML syrup Take 5 mLs by mouth every 8 (eight) hours as needed for cough. 120 mL 0   Multiple Vitamins-Minerals (CENTRUM ADULTS PO) Take 1 tablet by mouth daily.     nitroGLYCERIN (NITROSTAT) 0.4 MG SL tablet Place 1 tablet (0.4 mg total) under the tongue every 5 (five) minutes as needed for chest pain. 25 tablet 5   pantoprazole (PROTONIX) 20 MG tablet Take 1 tablet (20 mg total) by mouth daily. 90 tablet 3   polyethylene glycol powder (GLYCOLAX/MIRALAX) powder Take 17 g by mouth as needed for mild constipation.      SYNTHROID 88 MCG tablet Take 1 tablet (88 mcg total) by mouth daily. 90 tablet 3   tamsulosin (FLOMAX) 0.4 MG CAPS capsule TAKE 2 CAPSULES BY MOUTH EVERY DAY 180 capsule 0   atorvastatin (LIPITOR) 80 MG tablet Take 1 tablet (80 mg total)  by mouth daily. 90 tablet 1   Melatonin 5 MG TABS Take 5 mg by mouth at bedtime as needed (sleep).     No current facility-administered medications for this visit.    Allergies-reviewed and updated Allergies  Allergen Reactions   Contrast Media [Iodinated Contrast Media] Shortness Of Breath and Swelling    Swelling of the throat    Erythromycin Nausea And Vomiting   Niacin And Related Swelling   Penicillins Hives    Has patient had a PCN reaction causing immediate rash, facial/tongue/throat swelling, SOB or lightheadedness with hypotension: Y Has patient had a PCN reaction causing severe rash involving mucus membranes or skin necrosis: No Has patient had a PCN reaction that required hospitalization: No Has patient had a PCN reaction occurring within the last 10 years: No If all of the above answers are "NO", then may proceed with Cephalosporin use.    Social History   Socioeconomic History   Marital status: Married    Spouse name: Not on file   Number of children: 2   Years of education: Not on file   Highest education level: Not on file  Occupational History   Occupation: retired   Tobacco Use   Smoking status: Never   Smokeless tobacco: Never  Vaping Use   Vaping Use: Never used  Substance and Sexual Activity   Alcohol use: Yes    Comment: occasional   Drug use: No   Sexual activity: Not on file  Other Topics Concern   Not on file  Social History Narrative   Not on file   Social Determinants of Health   Financial Resource Strain: Low Risk    Difficulty of Paying Living Expenses: Not hard at all  Food Insecurity: No Food Insecurity   Worried About Charity fundraiser in the Last Year: Never true   [facility] in the Last Year: Never true  Transportation Needs: No Transportation Needs   Lack of Transportation (Medical): No   Lack of Transportation (Non-Medical): No  Physical Activity: Inactive   Days of Exercise per Week: 0 days   Minutes of Exercise per Session: 0 min  Stress: No Stress Concern Present   Feeling of Stress : Not at all  Social Connections: Moderately Integrated   Frequency of Communication with Friends and Family: More than three times a week   Frequency of Social Gatherings with Friends and Family: More than three times a week   Attends Religious Services: More than 4 times per year   Active Member of [employer] or Organizations: No   Attends Music therapist: Never   Marital Status: Married        Objective:  Physical Exam: BP 136/76 (BP Location: Left Arm)    Pulse (!) 55    Temp 97.6 F (36.4 C) (Temporal)    Ht 5' 11.65" (1.82 m)    Wt 220 lb 9.6 oz (100.1 kg)    SpO2 96%    BMI 30.21 kg/m   Body mass index is 30.21 kg/m. Wt Readings from Last 3 Encounters:  09/04/21 220 lb 9.6 oz (100.1 kg)  05/03/21 210 lb (95.3 kg)  04/26/21 213 lb 3 oz (96.7 kg)   Gen: NAD, resting comfortably HEENT: TMs normal bilaterally. Hair in right EAC. OP clear. No thyromegaly noted.  CV: RRR with no murmurs appreciated Pulm: NWOB, CTAB with no crackles, wheezes, or rhonchi GI: Normal bowel sounds present. Soft, Nontender,  Nondistended. MSK: no edema, cyanosis, or  clubbing noted Skin: warm, dry Neuro: CN2-12 grossly intact. Strength 5/5 in upper and lower extremities. Reflexes symmetric and intact bilaterally.  Psych: Normal affect and thought content      I,Savera Zaman,acting as a scribe for Dimas Chyle, MD.,have documented all relevant documentation on the behalf of Dimas Chyle, MD,as directed by  Dimas Chyle, MD while in the presence of Dimas Chyle, MD.    I, Dimas Chyle, MD, have reviewed all documentation for this visit. The documentation on 09/04/21 for the exam, diagnosis, procedures, and orders are all accurate and complete.  Time Spent: 41 minutes of total time was spent on the date of the encounter performing the following actions: chart review prior to seeing the patient including recent visits, obtaining history, performing a medically necessary exam, counseling on the treatment plan, placing orders, and documenting in our EHR.    Algis Greenhouse. [name], MD 09/04/2021 10:05 AM

## 2021-09-04 NOTE — Assessment & Plan Note (Signed)
Continue Protonix °

## 2021-09-06 NOTE — Progress Notes (Signed)
Please inform patient of the following:  His good cholesterol is a little low and his blood sugar is borderline.  Do not need to make any changes to treatment plan at this time. He should continue working on diet and exercise and we can recheck in a year.   Algis Greenhouse. [name], MD 09/06/2021 1:55 PM

## 2021-09-08 ENCOUNTER — Ambulatory Visit
Admission: RE | Admit: 2021-09-08 | Discharge: 2021-09-08 | Disposition: A | Discharge status: Home / Self Care | Payer: Medicare Other | Source: Ambulatory Visit | Attending: Neurosurgery | Admitting: Neurosurgery

## 2021-09-08 ENCOUNTER — Other Ambulatory Visit: Payer: Self-pay

## 2021-09-08 DIAGNOSIS — M5416 Radiculopathy, lumbar region: Secondary | ICD-10-CM

## 2021-09-11 ENCOUNTER — Other Ambulatory Visit: Payer: Self-pay | Admitting: Family Medicine

## 2021-09-24 ENCOUNTER — Telehealth: Payer: Self-pay | Admitting: Cardiovascular Disease

## 2021-09-24 NOTE — Telephone Encounter (Signed)
°  Pt c/o swelling: STAT is pt has developed SOB within 24 hours  If swelling, where is the swelling located? Both legs, more in the right, also right hand  How much weight have you gained and in what time span? 6 pounds but not sure the amount of time that he gained it in  Have you gained 3 pounds in a day or 5 pounds in a week? Not sure  Do you have a log of your daily weights (if so, list)? no  Are you currently taking a fluid pill? no  Are you currently SOB? Not now but occasionally  Have you traveled recently? No   Patient was taking Gabapentin and started swelling. He stopped the medication about a week ago but still has the swelling issues.

## 2021-09-24 NOTE — Telephone Encounter (Signed)
The patient is calling in complaining of swelling with bilateral feet, more on right side and right hand. Weight gain of 6 lbs in the last 6-10 months. Patient states he does not have shortness of breath currently but on occasion he will. States he can't do much because of a pinched nerve so he feels like this is deconditioning.  He starting taking Gabapentin the first week in January and started swelling, he stopped the medication Feb ~5 but the swelling has not gotten better. He stopped this medication on his own for itching and swelling. The patient states he will return to see his neurologist on March 3 who prescribed this medication. Advised patient I would route message to Dr. Burt Knack for advisement. Asked him to also contact Dr. Frederich Cha as this may all be related to the Gabapentin and should inform them about side effects and that he discontinued the medication.   Vital Signs:  1/22 134/66 64 1/25 150/88 57  2/1 128/75  58  2/20 147/80 54  ED precautions given. Verbalized understanding and agreement.

## 2021-09-28 ENCOUNTER — Encounter: Payer: Self-pay | Admitting: Cardiovascular Disease

## 2021-09-28 NOTE — Telephone Encounter (Signed)
Returned call to pt regarding swelling in his BLE that he attributes to use of Gabapentin. Pt states that Justine Null, PA at his pain clinic prescribed him Gabapentin 300mg  QD which he began on 08/10/21. Per pt, he began to notice swelling in his legs shortly after taking it, along with itching on his back and in groin area. Pt states his last dose was taken on 2/18, but the swelling has barely if at all, improved. Pt states he placed a call to his PCP about it, but was told it could definitely be from the drug-pt had already discontinued at that point, so no recommendation was given. Pt denies having called his neurologist or the pain clinic with these concerns. Verified that pt has stopped medication and he doesn't feel this is a cardiac issue. Denies SOB, cough, CP, or worsening swelling since discontinuation of medication. Does state he is still itching, but has begun using moisturizer to help. Pt agrees to call his neurologist Monday 2/27 for recommendations and will see the pain clinic on 3/1 for his next appt. Advised pt to call back if symptoms worsen or if he develops new concerns. Will route to [place] as an [place].

## 2021-09-30 NOTE — Telephone Encounter (Signed)
Thx; agree 

## 2021-10-01 NOTE — Telephone Encounter (Signed)
Pt made aware and does plan to see his PCP or call us if he develops any cardiac symptoms.

## 2021-10-01 NOTE — Telephone Encounter (Signed)
See other notes. Pt to follow-up with PCP. Agree with plan. If symptoms persist or if other cardiac symptoms arise, I'm happy to see him.

## 2021-10-05 ENCOUNTER — Other Ambulatory Visit: Payer: Self-pay

## 2021-10-05 ENCOUNTER — Other Ambulatory Visit (HOSPITAL_COMMUNITY): Payer: Self-pay | Admitting: Neurosurgery

## 2021-10-05 ENCOUNTER — Ambulatory Visit (HOSPITAL_COMMUNITY)
Admission: RE | Admit: 2021-10-05 | Discharge: 2021-10-05 | Disposition: A | Discharge status: Home / Self Care | Payer: Medicare Other | Source: Ambulatory Visit | Attending: Neurosurgery | Admitting: Neurosurgery

## 2021-10-05 DIAGNOSIS — M7989 Other specified soft tissue disorders: Secondary | ICD-10-CM

## 2021-10-18 ENCOUNTER — Telehealth: Payer: Self-pay | Admitting: Pharmacist

## 2021-10-18 NOTE — Progress Notes (Signed)
? ? ?Chronic Care Management ?Pharmacy Assistant  ? ?Name: Ryan [name]  MRN: [id number] DOB: 1945-11-22 ? ? ?Reason for Encounter: General Assessment ?  ? ?Recent office visits:  ?09/04/2021 OV (PCP) Vivi Barrack, MD; no medication changes indicated. ? ?Recent consult visits:  ?None ? ?Hospital visits:  ?None in previous 6 months ? ?Medications: ?Outpatient Encounter Medications as of 10/18/2021  ?Medication Sig  ? acetaminophen (TYLENOL) 325 MG tablet Take 325-650 mg by mouth every 6 (six) hours as needed for mild pain or moderate pain.  ? albuterol (VENTOLIN HFA) 108 (90 Base) MCG/ACT inhaler Inhale 2 puffs into the lungs every 4 (four) hours as needed for wheezing.  ? aspirin EC 81 MG EC tablet Take 1 tablet (81 mg total) by mouth daily.  ? atorvastatin (LIPITOR) 80 MG tablet Take 1 tablet (80 mg total) by mouth daily.  ? bisacodyl (DULCOLAX) 5 MG EC tablet Take 5 mg by mouth daily as needed for moderate constipation.  ? co-enzyme Q-10 30 MG capsule Take 30 mg by mouth daily.   ? diazepam (VALIUM) 2 MG tablet Take 30 minutes prior to procedure  ? fluorouracil (EFUDEX) 5 % cream Apply 1 application topically as needed.   ? HYDROcodone bit-homatropine (HYCODAN) 5-1.5 MG/5ML syrup Take 5 mLs by mouth every 8 (eight) hours as needed for cough.  ? Melatonin 5 MG TABS Take 5 mg by mouth at bedtime as needed (sleep).  ? Multiple Vitamins-Minerals (CENTRUM ADULTS PO) Take 1 tablet by mouth daily.  ? nitroGLYCERIN (NITROSTAT) 0.4 MG SL tablet Place 1 tablet (0.4 mg total) under the tongue every 5 (five) minutes as needed for chest pain.  ? pantoprazole (PROTONIX) 20 MG tablet TAKE 1 TABLET BY MOUTH EVERY DAY  ? polyethylene glycol powder (GLYCOLAX/MIRALAX) powder Take 17 g by mouth as needed for mild constipation.   ? SYNTHROID 88 MCG tablet Take 1 tablet (88 mcg total) by mouth daily.  ? tamsulosin (FLOMAX) 0.4 MG CAPS capsule TAKE 2 CAPSULES BY MOUTH EVERY DAY  ? ?No facility-administered encounter medications on  file as of 10/18/2021.  ? ?Patient Questions: ?How is your back pain? ?Patient states his back pain is not much different. He states he needs to talk with a neurosurgeon about having surgery. He is still having swelling in his ankles and feet. He states he had an ultrasound on his leg to make sure he didn't have a blood clot. He stopped taking Gabapentin for about a month to see if it would help with his swelling. He states he hasn't really noticed much difference with his swelling. ? ?Have you had any problems recently with your health? ?Patient states he hasn't had any other issues recently with his health. ? ?Have you had any problems with your pharmacy? ?He denies having any problems with his pharmacy. ? ?What issues or side effects are you having with your medications? ?He denies having any issues or side effects that he is aware of. ? ?What would you like me to pass along to Leata Mouse, CPP for him to help you with?  ?Patient does not have anything for me to pass along at this time. ? ?Care Gaps: ?Medicare Annual Wellness: Completed 07/06/2021 ?Hemoglobin A1C: 5.7% on 09/04/2021 ?Colonoscopy: Completed 08/25/2019 ? ?Future Appointments  ?Date Time Provider [place]  ?03/05/2022  3:45 PM [facility] CCM PHARMACIST [facility]  ?03/13/2022  9:40 AM Sherren Mocha, MD [facility]  ?07/19/2022  9:30 AM [facility] HEALTH COACH [facility]  ? ?  Star Rating Drugs: ?Atorvastatin - no recent fill dates available. ? ?April D Calhoun, [place] ?Clinical Pharmacist Assistant ?[phone]  ?

## 2021-11-13 ENCOUNTER — Telehealth: Payer: Self-pay | Admitting: Family Medicine

## 2021-11-13 NOTE — Telephone Encounter (Signed)
.. ?  Encourage patient to contact the pharmacy for refills or they can request refills through [facility] ? ?LAST APPOINTMENT DATE:  Please schedule appointment if longer than 1 year ? ?NEXT APPOINTMENT DATE: 08/27/21  ? ?MEDICATION:HYDROcodone bit-homatropine (HYCODAN) 5-1.5 MG/5ML syrup ? ?Is the patient out of medication?  YES  ? ?[facility] [address]  ? ?Let patient know to contact pharmacy at the end of the day to make sure medication is ready. ? ?Please notify patient to allow 48-72 hours to process  ?

## 2021-11-15 NOTE — Telephone Encounter (Signed)
Patient aware, need OV for refill  ?

## 2021-11-15 NOTE — Telephone Encounter (Signed)
Pt called in regarding this rx and is wanting to see if it can be ordered today due to going out of town tomorrow. Please advise ?

## 2021-11-20 ENCOUNTER — Other Ambulatory Visit: Payer: Self-pay | Admitting: Family Medicine

## 2021-11-20 DIAGNOSIS — E039 Hypothyroidism, unspecified: Secondary | ICD-10-CM

## 2021-11-20 DIAGNOSIS — L820 Inflamed seborrheic keratosis: Secondary | ICD-10-CM

## 2021-11-20 DIAGNOSIS — E032 Hypothyroidism due to medicaments and other exogenous substances: Secondary | ICD-10-CM

## 2021-11-20 DIAGNOSIS — E038 Other specified hypothyroidism: Secondary | ICD-10-CM

## 2021-11-20 DIAGNOSIS — J302 Other seasonal allergic rhinitis: Secondary | ICD-10-CM

## 2022-01-07 ENCOUNTER — Other Ambulatory Visit: Payer: Self-pay | Admitting: Neurosurgery

## 2022-01-09 ENCOUNTER — Other Ambulatory Visit: Payer: Self-pay | Admitting: Neurosurgery

## 2022-01-11 NOTE — Pre-Procedure Instructions (Signed)
Surgical Instructions    Your procedure is scheduled on 01/21/22.  Report to [facility] Main Entrance "A" at 05:30 A.M., then check in with the Admitting office.  Call this number if you have problems the morning of surgery:  [phone]   If you have any questions prior to your surgery date call [phone]: Open Monday-Friday 8am-4pm    Remember:  Do not eat or drink after midnight the night before your surgery    Take these medicines the morning of surgery with A SIP OF WATER:  atorvastatin (LIPITOR) SYNTHROID tamsulosin (FLOMAX)  As of today, STOP taking any Aspirin (unless otherwise instructed by your surgeon) Aleve, Naproxen, Ibuprofen, Motrin, Advil, Goody's, BC's, all herbal medications, fish oil, and all vitamins.           Do not wear jewelry or makeup Do not wear lotions, powders, perfumes/colognes, or deodorant. Do not shave 48 hours prior to surgery.  Men may shave face and neck. Do not bring valuables to the hospital. Do not wear nail polish, gel polish, artificial nails, or any other type of covering on natural nails (fingers and toes) If you have artificial nails or gel coating that need to be removed by a nail salon, please have this removed prior to surgery. Artificial nails or gel coating may interfere with anesthesia's ability to adequately monitor your vital signs.  [facility] is not responsible for any belongings or valuables. .   Do NOT Smoke (Tobacco/Vaping)  24 hours prior to your procedure  If you use a CPAP at night, you may bring your mask for your overnight stay.   Contacts, glasses, hearing aids, dentures or partials may not be worn into surgery, please bring cases for these belongings   For patients admitted to the hospital, discharge time will be determined by your treatment team.   Patients discharged the day of surgery will not be allowed to drive home, and someone needs to stay with them for 24 hours.   SURGICAL WAITING ROOM  VISITATION Patients having surgery or a procedure in a hospital may have two support people. Children under the age of 70 must have an adult with them who is not the patient. They may stay in the waiting area during the procedure and may switch out with other visitors. If the patient needs to stay at the hospital during part of their recovery, the visitor guidelines for inpatient rooms apply.  Please refer to the [facility] website for the visitor guidelines for Inpatients (after your surgery is over and you are in a regular room).    Special instructions:    Oral Hygiene is also important to reduce your risk of infection.  Remember - BRUSH YOUR TEETH THE MORNING OF SURGERY WITH YOUR REGULAR TOOTHPASTE   [facility]- Preparing For Surgery  Before surgery, you can play an important role. Because skin is not sterile, your skin needs to be as free of germs as possible. You can reduce the number of germs on your skin by washing with CHG (chlorahexidine gluconate) Soap before surgery.  CHG is an antiseptic cleaner which kills germs and bonds with the skin to continue killing germs even after washing.     Please do not use if you have an allergy to CHG or antibacterial soaps. If your skin becomes reddened/irritated stop using the CHG.  Do not shave (including legs and underarms) for at least 48 hours prior to first CHG shower. It is OK to shave your face.  Please follow  these instructions carefully.     Shower the NIGHT BEFORE SURGERY and the MORNING OF SURGERY with CHG Soap.   If you chose to wash your hair, wash your hair first as usual with your normal shampoo. After you shampoo, rinse your hair and body thoroughly to remove the shampoo.  Then [employer] and genitals (private parts) with your normal soap and rinse thoroughly to remove soap.  After that Use CHG Soap as you would any other liquid soap. You can apply CHG directly to the skin and wash gently with a scrungie or a clean washcloth.    Apply the CHG Soap to your body ONLY FROM THE NECK DOWN.  Do not use on open wounds or open sores. Avoid contact with your eyes, ears, mouth and genitals (private parts). Wash Face and genitals (private parts)  with your normal soap.   Wash thoroughly, paying special attention to the area where your surgery will be performed.  Thoroughly rinse your body with warm water from the neck down.  DO NOT shower/wash with your normal soap after using and rinsing off the CHG Soap.  Pat yourself dry with a CLEAN TOWEL.  Wear CLEAN PAJAMAS to bed the night before surgery  Place CLEAN SHEETS on your bed the night before your surgery  DO NOT SLEEP WITH PETS.   Day of Surgery:  Take a shower with CHG soap. Wear Clean/Comfortable clothing the morning of surgery Do not apply any deodorants/lotions.   Remember to brush your teeth WITH YOUR REGULAR TOOTHPASTE.    If you received a COVID test during your pre-op visit, it is requested that you wear a mask when out in public, stay away from anyone that may not be feeling well, and notify your surgeon if you develop symptoms. If you have been in contact with anyone that has tested positive in the last 10 days, please notify your surgeon.    Please read over the following fact sheets that you were given.

## 2022-01-14 ENCOUNTER — Encounter (HOSPITAL_COMMUNITY)
Admission: RE | Admit: 2022-01-14 | Discharge: 2022-01-14 | Disposition: A | Discharge status: Home / Self Care | Payer: Medicare Other | Source: Ambulatory Visit | Attending: Neurosurgery | Admitting: Neurosurgery

## 2022-01-14 ENCOUNTER — Encounter (HOSPITAL_COMMUNITY): Payer: Self-pay

## 2022-01-14 ENCOUNTER — Other Ambulatory Visit: Payer: Self-pay

## 2022-01-14 VITALS — BP 144/74 | HR 57 | Temp 98.1°F | Resp 17 | Ht 72.0 in | Wt 218.4 lb

## 2022-01-14 DIAGNOSIS — Z01818 Encounter for other preprocedural examination: Secondary | ICD-10-CM

## 2022-01-14 DIAGNOSIS — Z01812 Encounter for preprocedural laboratory examination: Secondary | ICD-10-CM | POA: Insufficient documentation

## 2022-01-14 LAB — CBC
HCT: 43.3 % (ref 39.0–52.0)
Hemoglobin: 14.4 g/dL (ref 13.0–17.0)
MCH: 31.5 pg (ref 26.0–34.0)
MCHC: 33.3 g/dL (ref 30.0–36.0)
MCV: 94.7 fL (ref 80.0–100.0)
Platelets: 257 10*3/uL (ref 150–400)
RBC: 4.57 MIL/uL (ref 4.22–5.81)
RDW: 14 % (ref 11.5–15.5)
WBC: 7.2 10*3/uL (ref 4.0–10.5)
nRBC: 0 % (ref 0.0–0.2)

## 2022-01-14 LAB — BASIC METABOLIC PANEL
Anion gap: 6 (ref 5–15)
BUN: 17 mg/dL (ref 8–23)
CO2: 30 mmol/L (ref 22–32)
Calcium: 9.8 mg/dL (ref 8.9–10.3)
Chloride: 103 mmol/L (ref 98–111)
Creatinine, Ser: 0.92 mg/dL (ref 0.61–1.24)
GFR, Estimated: >60 mL/min (ref >60)
Glucose, Bld: 101 mg/dL — ABNORMAL HIGH (ref 70–99)
Potassium: 4 mmol/L (ref 3.5–5.1)
Sodium: 139 mmol/L (ref 135–145)

## 2022-01-14 LAB — SURGICAL PCR SCREEN
MRSA, PCR: NEGATIVE
Staphylococcus aureus: NEGATIVE

## 2022-01-14 NOTE — Pre-Procedure Instructions (Signed)
Surgical Instructions    Your procedure is scheduled on Monday, June 19th, 2023.   Report to [facility] Main Entrance "A" at 05:30 A.M., then check in with the Admitting office.  Call this number if you have problems the morning of surgery:  [phone]   If you have any questions prior to your surgery date call [phone]: Open Monday-Friday 8am-4pm    Remember:  Do not eat or drink after midnight the night before your surgery    Take these medicines the morning of surgery with A SIP OF WATER:  atorvastatin (LIPITOR) SYNTHROID tamsulosin (FLOMAX)  As of today, STOP taking any Aspirin (unless otherwise instructed by your surgeon) Aleve, Naproxen, Ibuprofen, Motrin, Advil, Goody's, BC's, all herbal medications, fish oil, and all vitamins.    The day of surgery:          Do not wear jewelry  Do not wear lotions, powders, colognes, or deodorant. Men may shave face and neck. Do not bring valuables to the hospital.   [facility] is not responsible for any belongings or valuables. .   Do NOT Smoke (Tobacco/Vaping)  24 hours prior to your procedure  If you use a CPAP at night, you may bring your mask for your overnight stay.   Contacts, glasses, hearing aids, dentures or partials may not be worn into surgery, please bring cases for these belongings   For patients admitted to the hospital, discharge time will be determined by your treatment team.   Patients discharged the day of surgery will not be allowed to drive home, and someone needs to stay with them for 24 hours.   SURGICAL WAITING ROOM VISITATION Patients having surgery or a procedure in a hospital may have two support people. Children under the age of 12 must have an adult with them who is not the patient. They may stay in the waiting area during the procedure and may switch out with other visitors. If the patient needs to stay at the hospital during part of their recovery, the visitor guidelines for inpatient rooms  apply.  Please refer to the [facility] website for the visitor guidelines for Inpatients (after your surgery is over and you are in a regular room).    Special instructions:    Oral Hygiene is also important to reduce your risk of infection.  Remember - BRUSH YOUR TEETH THE MORNING OF SURGERY WITH YOUR REGULAR TOOTHPASTE   [facility]- Preparing For Surgery  Before surgery, you can play an important role. Because skin is not sterile, your skin needs to be as free of germs as possible. You can reduce the number of germs on your skin by washing with CHG (chlorahexidine gluconate) Soap before surgery.  CHG is an antiseptic cleaner which kills germs and bonds with the skin to continue killing germs even after washing.     Please do not use if you have an allergy to CHG or antibacterial soaps. If your skin becomes reddened/irritated stop using the CHG.  Do not shave (including legs and underarms) for at least 48 hours prior to first CHG shower. It is OK to shave your face.  Please follow these instructions carefully.     Shower the NIGHT BEFORE SURGERY and the MORNING OF SURGERY with CHG Soap.   If you chose to wash your hair, wash your hair first as usual with your normal shampoo. After you shampoo, rinse your hair and body thoroughly to remove the shampoo.  Then [employer] and genitals (private parts)  with your normal soap and rinse thoroughly to remove soap.  After that Use CHG Soap as you would any other liquid soap. You can apply CHG directly to the skin and wash gently with a scrungie or a clean washcloth.   Apply the CHG Soap to your body ONLY FROM THE NECK DOWN.  Do not use on open wounds or open sores. Avoid contact with your eyes, ears, mouth and genitals (private parts). Wash Face and genitals (private parts)  with your normal soap.   Wash thoroughly, paying special attention to the area where your surgery will be performed.  Thoroughly rinse your body with warm water from the neck  down.  DO NOT shower/wash with your normal soap after using and rinsing off the CHG Soap.  Pat yourself dry with a CLEAN TOWEL.  Wear CLEAN PAJAMAS to bed the night before surgery  Place CLEAN SHEETS on your bed the night before your surgery  DO NOT SLEEP WITH PETS.   Day of Surgery:  Take a shower with CHG soap. Wear Clean/Comfortable clothing the morning of surgery Do not apply any deodorants/lotions.   Remember to brush your teeth WITH YOUR REGULAR TOOTHPASTE.    If you received a COVID test during your pre-op visit, it is requested that you wear a mask when out in public, stay away from anyone that may not be feeling well, and notify your surgeon if you develop symptoms. If you have been in contact with anyone that has tested positive in the last 10 days, please notify your surgeon.    Please read over the following fact sheets that you were given.

## 2022-01-14 NOTE — Progress Notes (Signed)
PCP - Dimas Chyle, MD Cardiologist - Sherren Mocha, MD  PPM/ICD - denies Device Orders - n/a Rep Notified - n/a  Chest x-ray - n/a EKG - 03/06/2021 Stress Test - denies ECHO - denies Cardiac Cath - 12/08/2016  Sleep Study - denies CPAP - n/a  Fasting Blood Sugar - n/a  Blood Thinner Instructions: n/a  Aspirin Instructions - last dose - 01/18/22 per patient  Patient was instructed: As of today, STOP taking any Aspirin (unless otherwise instructed by your surgeon) Aleve, Naproxen, Ibuprofen, Motrin, Advil, Goody's, BC's, all herbal medications, fish oil, and all vitamins.    ERAS Protcol - n/a   COVID TEST- n/a   Anesthesia review: yes - cardiac history  Patient denies shortness of breath, fever, cough and chest pain at PAT appointment   All instructions explained to the patient, with a verbal understanding of the material. Patient agrees to go over the instructions while at home for a better understanding. Patient also instructed to self quarantine after being tested for COVID-19. The opportunity to ask questions was provided.

## 2022-01-15 ENCOUNTER — Telehealth: Payer: Self-pay

## 2022-01-15 NOTE — Progress Notes (Signed)
Anesthesia Chart Review:  Follows with cardiology for hx of CAD s/p NSTEM 2018 treated with DES to Lcx. He has done well since then. No recurrent angina. Last seen by Dr. Burt Knack 03/06/21. Per note, "The patient is doing well with no symptoms of angina.  No medication changes are made today.  We discussed some other options for exercise such as a stationary bike in light of his back problems.  I will see him back in 1 year for follow-up evaluation." Preop clearance per note 01/17/22 by Christen Bame, NP states, "Preoperative Cardiovascular Risk Assessment: He is doing well from a cardiac perspective and may proceed to surgery without further testing.According to the Revised Cardiac Risk Index (RCRI), his Perioperative Risk of Major Cardiac Event is (%): 0.9. His Functional Capacity in METs is: 5.62 according to the Duke Activity Status Index (DASI). Ideally aspirin should be continued without interruption, however if the bleeding risk is too great, aspirin may be held for 7 days prior to surgery. Please resume aspirin post operatively when it is felt to be safe from a bleeding standpoint."  Preop labs reviewed, WNL.  EKG 03/06/21: Sinus bradycardia. Rate 56.  Exercise tolerance test 06/20/17: Hypertensive response and mildly reduced exercise tolerance. Otherwise normal ECG stress test.  Cath and PCI 12/08/16: 1. Severe thrombotic stenosis of the left circumflex treated successfully with Primary PCI using a 3.5x24 mm Promus DES 2. Moderate diffuse LAD, RCA, and ramus intermedius stenoses 3. Normal LV function   Recommend: Aggressive medical therapy The patient's residual CAD affects small caliber vessels and will be best managed medically Aggrastat x 6 hours DAPT with ASA and brilinta x 12 months without interruption Anticipate discharge 48 hours if no complications   [place], [place] [facility] Short Stay Center/Anesthesiology Phone [phone] 01/15/2022 2:39 PM

## 2022-01-15 NOTE — Telephone Encounter (Signed)
   Pre-operative Risk Assessment    Patient Name: Ryan [name]  DOB: June 21, 1946 MRN: [id number]      Request for Surgical Clearance    Procedure:   Lumbar Laminotomy  Date of Surgery:  Clearance 01/21/22                                 Surgeon:  Dr. Newman Pies Surgeon's Group or Practice Name:  [facility] Phone number:  [phone] Fax number:  [phone] attention Nikki   Type of Clearance Requested:   - Medical  - Pharmacy:  Hold Aspirin .   Type of Anesthesia:  General    Additional requests/questions:    Signed, Keithen Capo   01/15/2022, 4:18 PM

## 2022-01-16 ENCOUNTER — Telehealth: Payer: Self-pay | Admitting: *Deleted

## 2022-01-16 NOTE — Telephone Encounter (Signed)
Request to hold aspirin for lumbar laminotomy on 01/21/22.   Dr. Tamala Julian, will you please review in Dr. Antionette Char absence. Patient was last seen on 03/06/2021 by Dr. Burt Knack at which time he reported no angina. We will update his current status by telephone. History of severe thrombotic stenosis of left circumflex treated successfully with primary PCI using Promus DES, moderate diffuse LAD, RCA, and ramus intermedius stenosis by cardiac catheterization 12/08/2016.  Please route your response to p cv div preop.  Thank you, Emmaline Life, NP-C    01/16/2022, [place] [address] Office [phone] Fax [phone]

## 2022-01-16 NOTE — Telephone Encounter (Signed)
Pt agreeable to plan of care for tele pre op appt 01/17/22 @ 10 am. Med rec and consent are done.     Patient Consent for Virtual Visit        [name] has provided verbal consent on 01/16/2022 for a virtual visit (video or telephone).   CONSENT FOR VIRTUAL VISIT FOR:  Ryan [name]  By participating in this virtual visit I agree to the following:  I hereby voluntarily request, consent and authorize [facility] and its employed or contracted physicians, physician assistants, nurse practitioners or other licensed health care professionals (the Practitioner), to provide me with telemedicine health care services (the "Services") as deemed necessary by the treating Practitioner. I acknowledge and consent to receive the Services by the Practitioner via telemedicine. I understand that the telemedicine visit will involve communicating with the Practitioner through live audiovisual communication technology and the disclosure of certain medical information by electronic transmission. I acknowledge that I have been given the opportunity to request an in-person assessment or other available alternative prior to the telemedicine visit and am voluntarily participating in the telemedicine visit.  I understand that I have the right to withhold or withdraw my consent to the use of telemedicine in the course of my care at any time, without affecting my right to future care or treatment, and that the Practitioner or I may terminate the telemedicine visit at any time. I understand that I have the right to inspect all information obtained and/or recorded in the course of the telemedicine visit and may receive copies of available information for a reasonable fee.  I understand that some of the potential risks of receiving the Services via telemedicine include:  Delay or interruption in medical evaluation due to technological equipment failure or disruption; Information transmitted may not be sufficient  (e.g. poor resolution of images) to allow for appropriate medical decision making by the Practitioner; and/or  In rare instances, security protocols could fail, causing a breach of personal health information.  Furthermore, I acknowledge that it is my responsibility to provide information about my medical history, conditions and care that is complete and accurate to the best of my ability. I acknowledge that Practitioner's advice, recommendations, and/or decision may be based on factors not within their control, such as incomplete or inaccurate data provided by me or distortions of diagnostic images or specimens that may result from electronic transmissions. I understand that the practice of medicine is not an exact science and that Practitioner makes no warranties or guarantees regarding treatment outcomes. I acknowledge that a copy of this consent can be made available to me via my patient portal ([facility]), or I can request a printed copy by calling the office of [facility].    I understand that my insurance will be billed for this visit.   I have read or had this consent read to me. I understand the contents of this consent, which adequately explains the benefits and risks of the Services being provided via telemedicine.  I have been provided ample opportunity to ask questions regarding this consent and the Services and have had my questions answered to my satisfaction. I give my informed consent for the services to be provided through the use of telemedicine in my medical care

## 2022-01-16 NOTE — Telephone Encounter (Signed)
Pt agreeable to plan of care for tele pre op appt 01/17/22 @ 10 am. Med rec and consent are done.

## 2022-01-16 NOTE — Telephone Encounter (Signed)
Primary [place], MD  Chart reviewed as part of pre-operative protocol coverage. Because of Ryan [name]'s past medical history and time since last visit, he/she will require a virtual visit/telephone call in order to better assess preoperative cardiovascular risk.  Pre-op covering staff: - Please contact patient, obtain consent, and schedule appointment   Request regarding holding antiplatelet has been addressed by Dr. Tamala Julian.  Ryan Life, NP-C    01/16/2022, 9:33 AM [place] [address], [address] Office [phone] Fax [phone]

## 2022-01-17 ENCOUNTER — Encounter: Payer: Self-pay | Admitting: Nurse Practitioner

## 2022-01-17 ENCOUNTER — Ambulatory Visit (INDEPENDENT_AMBULATORY_CARE_PROVIDER_SITE_OTHER): Payer: Medicare Other | Admitting: Nurse Practitioner

## 2022-01-17 DIAGNOSIS — Z01810 Encounter for preprocedural cardiovascular examination: Secondary | ICD-10-CM

## 2022-01-17 NOTE — Anesthesia Preprocedure Evaluation (Signed)
Anesthesia Evaluation Anesthesia Physical Anesthesia Plan  ASA:   Anesthesia Plan:    Post-op Pain Management:    Induction:   PONV Risk Score and Plan:   Airway Management Planned:   Additional Equipment:   Intra-op Plan:   Post-operative Plan:   Informed Consent:   Plan Discussed with:   Anesthesia Plan Comments: (PAT note by Karoline Caldwell, PA-C:  Follows with cardiology for hx of CAD s/p NSTEM 2018 treated with DES to Lcx. He has done well since then. No recurrent angina. Last seen by Dr. Burt Knack 03/06/21. Per note, "The patient is doing well with no symptoms of angina. No medication changes are made today. We discussed some other options for exercise such as a stationary bike in light of his back problems. I will see him back in 1 year for follow-up evaluation." Preop clearance per note 01/17/22 by Christen Bame, NP states, "Preoperative Cardiovascular Risk Assessment:He is doing well from a cardiac perspective and may proceed to surgery without further testing.According to the Revised Cardiac Risk Index (RCRI),hisPerioperative Risk of Major Cardiac Event is (%): 0.9.HisFunctional Capacity in METs is: 5.62according to the Duke Activity Status Index (DASI). Ideally aspirin should be continued without interruption, however if the bleeding risk is too great, aspirin may be held for 7 days prior to surgery. Please resume aspirin post operatively when it is felt to be safe from a bleeding standpoint."  Preop labs reviewed, WNL.  EKG 03/06/21: Sinus bradycardia. Rate 56.  Exercise tolerance test 06/20/17: Hypertensive response and mildly reduced exercise tolerance. Otherwise normal ECG stress test.  Cath and PCI 12/08/16: 1. Severe thrombotic stenosis of the left circumflex treated successfully with Primary PCI using a 3.5x24 mm Promus DES 2. Moderate diffuse LAD, RCA, and ramus intermedius stenoses 3. Normal LV function  Recommend: . Aggressive medical therapy . The  patient's residual CAD affects small caliber vessels and will be best managed medically . Aggrastat x 6 hours . DAPT with ASA and brilinta x 12 months without interruption . Anticipate discharge 48 hours if no complications   )        Anesthesia Quick Evaluation

## 2022-01-17 NOTE — Progress Notes (Signed)
Virtual Visit via Telephone Note   Because of Ryan [name]'s co-morbid illnesses, he is at least at moderate risk for complications without adequate follow up.  This format is felt to be most appropriate for this patient at this time.  The patient did not have access to video technology/had technical difficulties with video requiring transitioning to audio format only (telephone).  All issues noted in this document were discussed and addressed.  No physical exam could be performed with this format.  Please refer to the patient's chart for his consent to telehealth for [facility].  Evaluation Performed:  Preoperative cardiovascular risk assessment _____________   Date:  01/17/2022   Patient ID:  Ryan [name], DOB [birth year], MRN [id number] Patient Location:  Home Provider location:   Office  Primary Care Provider:  Vivi Barrack, MD Primary Cardiologist:  Sherren Mocha, MD  Chief Complaint / Patient Profile   76 y.o. y/o male with a h/o CAD s/p STEMI 2018 involving left circumflex treated with DES, hyperlipidemia, and leg edema who is pending lumbar laminotomy and presents today for telephonic preoperative cardiovascular risk assessment.  Past Medical History    Past Medical History:  Diagnosis Date   Allergy    seasonal   Anxiety    Arthritis    Asthma    Cataract    Coronary artery disease 12/08/2016   STEMI, DES Circumflex   GERD (gastroesophageal reflux disease)    Hyperlipidemia    Hypothyroidism    STEMI (ST elevation myocardial infarction) ([place]) 12/08/2016   Past Surgical History:  Procedure Laterality Date   cataract Bilateral 03/2016   COLONOSCOPY  05/31/2004   COLONOSCOPY  06/30/2018   epidural steroid shot     x 3 , lumbar spine area   EYE SURGERY     bilateral cataracts   LEFT HEART CATH AND CORONARY ANGIOGRAPHY N/A 12/08/2016   Procedure: Left Heart Cath and Coronary Angiography;  Surgeon: Sherren Mocha, MD;  Location: [facility];  Service: Cardiovascular;  Laterality: N/A;   POLYPECTOMY     SHOULDER SURGERY Right    Rotator Cuff Tear Repair   TONSILLECTOMY     UPPER GASTROINTESTINAL ENDOSCOPY      Allergies  Allergies  Allergen Reactions   Contrast Media [Iodinated Contrast Media] Shortness Of Breath and Swelling    Swelling of the throat    Erythromycin Nausea And Vomiting   Niacin And Related Swelling   Penicillins Hives    Has patient had a PCN reaction causing immediate rash, facial/tongue/throat swelling, SOB or lightheadedness with hypotension: Y Has patient had a PCN reaction causing severe rash involving mucus membranes or skin necrosis: No Has patient had a PCN reaction that required hospitalization: No Has patient had a PCN reaction occurring within the last 10 years: No If all of the above answers are "NO", then may proceed with Cephalosporin use.   Gabapentin Swelling    Severe swelling in legs    History of Present Illness    Ryan [name] is a 76 y.o. male who presents via audio/video conferencing for a telehealth visit today.  Pt was last seen in cardiology clinic on 03/06/2021 by Dr. Burt Knack.  At that time Ryan [name] was doing well.  The patient is now pending procedure as outlined above. Since his last visit, he reports that his leg swelling has improved since he stopped gabapentin. He had a negative DVT ultrasound 10/05/21. He denies chest pain, shortness of breath, palpitations,  melena, hematuria, hemoptysis, diaphoresis, weakness, presyncope, syncope, orthopnea, and PND. He is having chronic pain in his back with all activity but otherwise is able to complete all activities without concerning symptoms.   Home Medications    Prior to Admission medications   Medication Sig Start Date End Date Taking? Authorizing Provider  acetaminophen (TYLENOL) 325 MG tablet Take 325-650 mg by mouth every 6 (six) hours as needed for mild pain or moderate pain.    [provider]   albuterol (VENTOLIN HFA) 108 (90 Base) MCG/ACT inhaler Inhale 2 puffs into the lungs every 4 (four) hours as needed for wheezing. 05/03/21   [name], PA  aspirin EC 81 MG EC tablet Take 1 tablet (81 mg total) by mouth daily. 12/11/16   Delos Haring, PA-C  atorvastatin (LIPITOR) 80 MG tablet Take 1 tablet (80 mg total) by mouth daily. 09/04/21   Vivi Barrack, MD  bisacodyl (DULCOLAX) 5 MG EC tablet Take 5 mg by mouth daily as needed for moderate constipation.    [provider]  Coenzyme Q10 200 MG capsule Take 200 mg by mouth daily.    [provider]  diazepam (VALIUM) 2 MG tablet Take 30 minutes prior to procedure Patient not taking: Reported on 01/10/2022 09/04/21   Vivi Barrack, MD  fluorouracil (EFUDEX) 5 % cream Apply 1 application. topically daily as needed (skin cancer). 08/18/18   [provider]  HYDROcodone bit-homatropine (HYCODAN) 5-1.5 MG/5ML syrup Take 5 mLs by mouth every 8 (eight) hours as needed for cough. 04/26/21   [name], FNP  nitroGLYCERIN (NITROSTAT) 0.4 MG SL tablet Place 1 tablet (0.4 mg total) under the tongue every 5 (five) minutes as needed for chest pain. 07/03/21   Sherren Mocha, MD  pantoprazole (PROTONIX) 20 MG tablet TAKE 1 TABLET BY MOUTH EVERY DAY Patient taking differently: Take 20 mg by mouth daily as needed for heartburn or indigestion. 09/11/21   Vivi Barrack, MD  polyethylene glycol powder (GLYCOLAX/MIRALAX) powder Take 17 g by mouth as needed for mild constipation.     [provider]  SYNTHROID 88 MCG tablet TAKE 1 TABLET BY MOUTH EVERY DAY 11/20/21   Vivi Barrack, MD  tamsulosin (FLOMAX) 0.4 MG CAPS capsule TAKE 2 CAPSULES BY MOUTH EVERY DAY Patient taking differently: Take 0.4 mg by mouth daily. 05/31/21   Vivi Barrack, MD    Physical Exam    Vital Signs:  Mana Morison does not have vital signs available for review today.  Given telephonic nature of communication, physical exam is  limited. AAOx3. NAD. Normal affect.  Speech and respirations are unlabored.  Accessory Clinical Findings    None  Assessment & Plan    1.  Preoperative Cardiovascular Risk Assessment: He is doing well from a cardiac perspective and may proceed to surgery without further testing.According to the Revised Cardiac Risk Index (RCRI), his Perioperative Risk of Major Cardiac Event is (%): 0.9. His Functional Capacity in METs is: 5.62 according to the Duke Activity Status Index (DASI).  Ideally aspirin should be continued without interruption, however if the bleeding risk is too great, aspirin may be held for 7 days prior to surgery. Please resume aspirin post operatively when it is felt to be safe from a bleeding standpoint.   A copy of this note will be routed to requesting surgeon.  Time:   Today, I have spent 10 minutes with the patient with telehealth technology discussing medical history, symptoms, and management  plan.     Emmaline Life, NP-C    01/17/2022, 10:06 AM [place] [address], [address] Office [phone] Fax [phone]

## 2022-01-21 ENCOUNTER — Ambulatory Visit (HOSPITAL_COMMUNITY): Payer: Medicare Other | Admitting: Physician Assistant

## 2022-01-21 ENCOUNTER — Encounter (HOSPITAL_COMMUNITY): Payer: Self-pay | Admitting: Neurosurgery

## 2022-01-21 ENCOUNTER — Ambulatory Visit (HOSPITAL_BASED_OUTPATIENT_CLINIC_OR_DEPARTMENT_OTHER): Payer: Medicare Other | Admitting: Certified Registered"

## 2022-01-21 ENCOUNTER — Ambulatory Visit (HOSPITAL_COMMUNITY)
Admission: RE | Admit: 2022-01-21 | Discharge: 2022-01-22 | Disposition: A | Discharge status: Home / Self Care | Payer: Medicare Other | Attending: Neurosurgery | Admitting: Neurosurgery

## 2022-01-21 ENCOUNTER — Ambulatory Visit (HOSPITAL_COMMUNITY): Payer: Medicare Other

## 2022-01-21 ENCOUNTER — Ambulatory Visit (HOSPITAL_COMMUNITY)
Admission: RE | Disposition: A | Discharge status: Home / Self Care | Payer: Self-pay | Source: Home / Self Care | Attending: Neurosurgery

## 2022-01-21 ENCOUNTER — Other Ambulatory Visit: Payer: Self-pay

## 2022-01-21 DIAGNOSIS — K219 Gastro-esophageal reflux disease without esophagitis: Secondary | ICD-10-CM | POA: Diagnosis not present

## 2022-01-21 DIAGNOSIS — I252 Old myocardial infarction: Secondary | ICD-10-CM | POA: Diagnosis not present

## 2022-01-21 DIAGNOSIS — Z955 Presence of coronary angioplasty implant and graft: Secondary | ICD-10-CM | POA: Insufficient documentation

## 2022-01-21 DIAGNOSIS — F419 Anxiety disorder, unspecified: Secondary | ICD-10-CM | POA: Insufficient documentation

## 2022-01-21 DIAGNOSIS — I2510 Atherosclerotic heart disease of native coronary artery without angina pectoris: Secondary | ICD-10-CM | POA: Diagnosis not present

## 2022-01-21 DIAGNOSIS — E039 Hypothyroidism, unspecified: Secondary | ICD-10-CM | POA: Insufficient documentation

## 2022-01-21 DIAGNOSIS — M5416 Radiculopathy, lumbar region: Secondary | ICD-10-CM

## 2022-01-21 DIAGNOSIS — Z01818 Encounter for other preprocedural examination: Secondary | ICD-10-CM

## 2022-01-21 DIAGNOSIS — J45909 Unspecified asthma, uncomplicated: Secondary | ICD-10-CM | POA: Diagnosis not present

## 2022-01-21 DIAGNOSIS — M48062 Spinal stenosis, lumbar region with neurogenic claudication: Secondary | ICD-10-CM | POA: Diagnosis present

## 2022-01-21 HISTORY — PX: LUMBAR LAMINECTOMY/DECOMPRESSION MICRODISCECTOMY: SHX5026

## 2022-01-21 SURGERY — LUMBAR LAMINECTOMY/DECOMPRESSION MICRODISCECTOMY 3 LEVELS
Anesthesia: General | Laterality: Bilateral

## 2022-01-21 MED ORDER — OXYCODONE HCL 5 MG PO TABS
5.0000 mg | ORAL_TABLET | ORAL | Status: DC | PRN
  Administered 2022-01-21 – 2022-01-22 (×4): 5 mg via ORAL
  Filled 2022-01-21 (×4): qty 1

## 2022-01-21 MED ORDER — PROPOFOL 10 MG/ML IV BOLUS
INTRAVENOUS | Status: AC
  Filled 2022-01-21: qty 20

## 2022-01-21 MED ORDER — LIDOCAINE 2% (20 MG/ML) 5 ML SYRINGE
INTRAMUSCULAR | Status: DC | PRN
  Administered 2022-01-21: 100 mg via INTRAVENOUS

## 2022-01-21 MED ORDER — THROMBIN 5000 UNITS EX SOLR
CUTANEOUS | Status: AC
  Filled 2022-01-21: qty 5000

## 2022-01-21 MED ORDER — ROCURONIUM BROMIDE 10 MG/ML (PF) SYRINGE
PREFILLED_SYRINGE | INTRAVENOUS | Status: AC
  Filled 2022-01-21: qty 10

## 2022-01-21 MED ORDER — VANCOMYCIN HCL IN DEXTROSE 1-5 GM/200ML-% IV SOLN
INTRAVENOUS | Status: AC
  Administered 2022-01-21: 1000 mg via INTRAVENOUS
  Filled 2022-01-21: qty 200

## 2022-01-21 MED ORDER — TAMSULOSIN HCL 0.4 MG PO CAPS
0.4000 mg | ORAL_CAPSULE | Freq: Every day | ORAL | Status: DC
  Administered 2022-01-22: 0.4 mg via ORAL
  Filled 2022-01-21: qty 1

## 2022-01-21 MED ORDER — POLYETHYLENE GLYCOL 3350 17 G PO PACK: 17.0000 g | PACK | ORAL | Status: DC | PRN

## 2022-01-21 MED ORDER — PHENOL 1.4 % MT LIQD: 1.0000 | OROMUCOSAL | Status: DC | PRN

## 2022-01-21 MED ORDER — PANTOPRAZOLE SODIUM 20 MG PO TBEC
20.0000 mg | DELAYED_RELEASE_TABLET | Freq: Every day | ORAL | Status: DC | PRN
Start: 2022-01-21 — End: 2022-01-22
  Administered 2022-01-22: 20 mg via ORAL
  Filled 2022-01-21: qty 1

## 2022-01-21 MED ORDER — VANCOMYCIN HCL IN DEXTROSE 1-5 GM/200ML-% IV SOLN: 1000.0000 mg | INTRAVENOUS | Status: AC

## 2022-01-21 MED ORDER — BUPIVACAINE-EPINEPHRINE (PF) 0.5% -1:200000 IJ SOLN
INTRAMUSCULAR | Status: DC | PRN
  Administered 2022-01-21: 10 mL

## 2022-01-21 MED ORDER — MIDAZOLAM HCL 5 MG/5ML IJ SOLN
INTRAMUSCULAR | Status: DC | PRN
  Administered 2022-01-21: 1 mg via INTRAVENOUS

## 2022-01-21 MED ORDER — ORAL CARE MOUTH RINSE: 15.0000 mL | Freq: Once | OROMUCOSAL | Status: AC

## 2022-01-21 MED ORDER — BUPIVACAINE-EPINEPHRINE 0.5% -1:200000 IJ SOLN
INTRAMUSCULAR | Status: AC
Start: 2022-01-21 — End: ?
  Filled 2022-01-21: qty 1

## 2022-01-21 MED ORDER — OXYCODONE HCL 5 MG PO TABS: 10.0000 mg | ORAL_TABLET | ORAL | Status: DC | PRN

## 2022-01-21 MED ORDER — FENTANYL CITRATE (PF) 250 MCG/5ML IJ SOLN
INTRAMUSCULAR | Status: AC
  Filled 2022-01-21: qty 5

## 2022-01-21 MED ORDER — FENTANYL CITRATE (PF) 100 MCG/2ML IJ SOLN
INTRAMUSCULAR | Status: DC | PRN
  Administered 2022-01-21 (×3): 50 ug via INTRAVENOUS

## 2022-01-21 MED ORDER — ATORVASTATIN CALCIUM 80 MG PO TABS
80.0000 mg | ORAL_TABLET | Freq: Every day | ORAL | Status: DC
  Administered 2022-01-21: 80 mg via ORAL
  Filled 2022-01-21: qty 1

## 2022-01-21 MED ORDER — DEXAMETHASONE SODIUM PHOSPHATE 10 MG/ML IJ SOLN
INTRAMUSCULAR | Status: DC | PRN
  Administered 2022-01-21: 10 mg via INTRAVENOUS

## 2022-01-21 MED ORDER — PROMETHAZINE HCL 25 MG/ML IJ SOLN: 6.2500 mg | INTRAMUSCULAR | Status: DC | PRN

## 2022-01-21 MED ORDER — ACETAMINOPHEN 325 MG PO TABS: 650.0000 mg | ORAL_TABLET | ORAL | Status: DC | PRN

## 2022-01-21 MED ORDER — 0.9 % SODIUM CHLORIDE (POUR BTL) OPTIME
TOPICAL | Status: DC | PRN
  Administered 2022-01-21: 1000 mL

## 2022-01-21 MED ORDER — CYCLOBENZAPRINE HCL 10 MG PO TABS
ORAL_TABLET | ORAL | Status: AC
  Filled 2022-01-21: qty 1

## 2022-01-21 MED ORDER — BISACODYL 10 MG RE SUPP: 10.0000 mg | Freq: Every day | RECTAL | Status: DC | PRN

## 2022-01-21 MED ORDER — HYDROCODONE BIT-HOMATROP MBR 5-1.5 MG/5ML PO SOLN: 5.0000 mL | Freq: Three times a day (TID) | ORAL | Status: DC | PRN

## 2022-01-21 MED ORDER — BACITRACIN ZINC 500 UNIT/GM EX OINT
TOPICAL_OINTMENT | CUTANEOUS | Status: AC
  Filled 2022-01-21: qty 28.35

## 2022-01-21 MED ORDER — MORPHINE SULFATE (PF) 4 MG/ML IV SOLN: 4.0000 mg | INTRAVENOUS | Status: DC | PRN

## 2022-01-21 MED ORDER — SODIUM CHLORIDE 0.9% FLUSH: 3.0000 mL | INTRAVENOUS | Status: DC | PRN

## 2022-01-21 MED ORDER — HYDROMORPHONE HCL 1 MG/ML IJ SOLN
INTRAMUSCULAR | Status: AC
  Administered 2022-01-21: 0.5 mg via INTRAVENOUS
  Filled 2022-01-21: qty 1

## 2022-01-21 MED ORDER — SODIUM CHLORIDE 0.9% FLUSH
3.0000 mL | Freq: Two times a day (BID) | INTRAVENOUS | Status: DC
  Administered 2022-01-21 (×2): 3 mL via INTRAVENOUS

## 2022-01-21 MED ORDER — PHENYLEPHRINE 80 MCG/ML (10ML) SYRINGE FOR IV PUSH (FOR BLOOD PRESSURE SUPPORT)
PREFILLED_SYRINGE | INTRAVENOUS | Status: DC | PRN
  Administered 2022-01-21 (×4): 80 ug via INTRAVENOUS

## 2022-01-21 MED ORDER — MENTHOL 3 MG MT LOZG: 1.0000 | LOZENGE | OROMUCOSAL | Status: DC | PRN

## 2022-01-21 MED ORDER — LACTATED RINGERS IV SOLN: INTRAVENOUS | Status: DC

## 2022-01-21 MED ORDER — ROCURONIUM BROMIDE 100 MG/10ML IV SOLN
INTRAVENOUS | Status: DC | PRN
  Administered 2022-01-21 (×3): 10 mg via INTRAVENOUS
  Administered 2022-01-21: 100 mg via INTRAVENOUS

## 2022-01-21 MED ORDER — OXYCODONE HCL 5 MG/5ML PO SOLN: 5.0000 mg | Freq: Once | ORAL | Status: AC | PRN

## 2022-01-21 MED ORDER — SODIUM CHLORIDE 0.9 % IV SOLN
250.0000 mL | INTRAVENOUS | Status: DC
  Administered 2022-01-21: 250 mL via INTRAVENOUS

## 2022-01-21 MED ORDER — DOCUSATE SODIUM 100 MG PO CAPS
100.0000 mg | ORAL_CAPSULE | Freq: Two times a day (BID) | ORAL | Status: DC
  Administered 2022-01-21 – 2022-01-22 (×2): 100 mg via ORAL
  Filled 2022-01-21 (×2): qty 1

## 2022-01-21 MED ORDER — ACETAMINOPHEN 500 MG PO TABS
1000.0000 mg | ORAL_TABLET | Freq: Four times a day (QID) | ORAL | Status: AC
  Administered 2022-01-21 – 2022-01-22 (×4): 1000 mg via ORAL
  Filled 2022-01-21 (×4): qty 2

## 2022-01-21 MED ORDER — LEVOTHYROXINE SODIUM 88 MCG PO TABS
88.0000 ug | ORAL_TABLET | Freq: Every day | ORAL | Status: DC
  Administered 2022-01-22: 88 ug via ORAL
  Filled 2022-01-21: qty 1

## 2022-01-21 MED ORDER — ONDANSETRON HCL 4 MG/2ML IJ SOLN: 4.0000 mg | Freq: Four times a day (QID) | INTRAMUSCULAR | Status: DC | PRN

## 2022-01-21 MED ORDER — BUPIVACAINE-EPINEPHRINE 0.5% -1:200000 IJ SOLN
INTRAMUSCULAR | Status: AC
  Filled 2022-01-21: qty 1

## 2022-01-21 MED ORDER — OXYCODONE HCL 5 MG PO TABS
ORAL_TABLET | ORAL | Status: AC
  Filled 2022-01-21: qty 1

## 2022-01-21 MED ORDER — ALBUTEROL SULFATE HFA 108 (90 BASE) MCG/ACT IN AERS: 2.0000 | INHALATION_SPRAY | RESPIRATORY_TRACT | Status: DC | PRN

## 2022-01-21 MED ORDER — CHLORHEXIDINE GLUCONATE CLOTH 2 % EX PADS: 6.0000 | MEDICATED_PAD | Freq: Once | CUTANEOUS | Status: DC

## 2022-01-21 MED ORDER — OXYCODONE HCL 5 MG PO TABS
5.0000 mg | ORAL_TABLET | Freq: Once | ORAL | Status: AC | PRN
  Administered 2022-01-21: 5 mg via ORAL

## 2022-01-21 MED ORDER — VANCOMYCIN HCL IN DEXTROSE 1-5 GM/200ML-% IV SOLN
1000.0000 mg | Freq: Once | INTRAVENOUS | Status: AC
  Administered 2022-01-21: 1000 mg via INTRAVENOUS
  Filled 2022-01-21: qty 200

## 2022-01-21 MED ORDER — CYCLOBENZAPRINE HCL 10 MG PO TABS
10.0000 mg | ORAL_TABLET | Freq: Three times a day (TID) | ORAL | Status: DC | PRN
Start: 2022-01-21 — End: 2022-01-22
  Administered 2022-01-21 (×2): 10 mg via ORAL
  Filled 2022-01-21 (×2): qty 1

## 2022-01-21 MED ORDER — CHLORHEXIDINE GLUCONATE 0.12 % MT SOLN
15.0000 mL | Freq: Once | OROMUCOSAL | Status: AC
  Administered 2022-01-21: 15 mL via OROMUCOSAL
  Filled 2022-01-21: qty 15

## 2022-01-21 MED ORDER — ONDANSETRON HCL 4 MG/2ML IJ SOLN
INTRAMUSCULAR | Status: DC | PRN
  Administered 2022-01-21: 4 mg via INTRAVENOUS

## 2022-01-21 MED ORDER — LIDOCAINE 2% (20 MG/ML) 5 ML SYRINGE
INTRAMUSCULAR | Status: AC
  Filled 2022-01-21: qty 5

## 2022-01-21 MED ORDER — THROMBIN (RECOMBINANT) 5000 UNITS EX SOLR
CUTANEOUS | Status: DC | PRN
  Administered 2022-01-21: 10 mL via TOPICAL

## 2022-01-21 MED ORDER — THROMBIN 5000 UNITS EX SOLR
OROMUCOSAL | Status: DC | PRN
  Administered 2022-01-21: 5 mL via TOPICAL

## 2022-01-21 MED ORDER — HYDROMORPHONE HCL 1 MG/ML IJ SOLN
0.2500 mg | INTRAMUSCULAR | Status: DC | PRN
  Administered 2022-01-21: 0.5 mg via INTRAVENOUS

## 2022-01-21 MED ORDER — PROPOFOL 10 MG/ML IV BOLUS
INTRAVENOUS | Status: DC | PRN
  Administered 2022-01-21: 160 mg via INTRAVENOUS

## 2022-01-21 MED ORDER — ACETAMINOPHEN 650 MG RE SUPP: 650.0000 mg | RECTAL | Status: DC | PRN

## 2022-01-21 MED ORDER — NITROGLYCERIN 0.4 MG SL SUBL
0.4000 mg | SUBLINGUAL_TABLET | SUBLINGUAL | Status: DC | PRN
Start: 2022-01-21 — End: 2022-01-22

## 2022-01-21 MED ORDER — SUGAMMADEX SODIUM 200 MG/2ML IV SOLN
INTRAVENOUS | Status: DC | PRN
  Administered 2022-01-21: 200 mg via INTRAVENOUS

## 2022-01-21 MED ORDER — ONDANSETRON HCL 4 MG/2ML IJ SOLN
INTRAMUSCULAR | Status: AC
  Filled 2022-01-21: qty 2

## 2022-01-21 MED ORDER — BISACODYL 5 MG PO TBEC
5.0000 mg | DELAYED_RELEASE_TABLET | Freq: Every day | ORAL | Status: DC | PRN
Start: 2022-01-21 — End: 2022-01-22

## 2022-01-21 MED ORDER — THROMBIN 5000 UNITS EX SOLR
CUTANEOUS | Status: AC
Start: 2022-01-21 — End: ?
  Filled 2022-01-21: qty 10000

## 2022-01-21 MED ORDER — DEXAMETHASONE SODIUM PHOSPHATE 10 MG/ML IJ SOLN
INTRAMUSCULAR | Status: AC
  Filled 2022-01-21: qty 1

## 2022-01-21 MED ORDER — AMISULPRIDE (ANTIEMETIC) 5 MG/2ML IV SOLN
10.0000 mg | Freq: Once | INTRAVENOUS | Status: DC | PRN
Start: 2022-01-21 — End: 2022-01-21

## 2022-01-21 MED ORDER — MIDAZOLAM HCL 2 MG/2ML IJ SOLN
INTRAMUSCULAR | Status: AC
  Filled 2022-01-21: qty 2

## 2022-01-21 MED ORDER — ONDANSETRON HCL 4 MG PO TABS: 4.0000 mg | ORAL_TABLET | Freq: Four times a day (QID) | ORAL | Status: DC | PRN

## 2022-01-21 SURGICAL SUPPLY — 55 items
BAG COUNTER SPONGE SURGICOUNT (BAG) ×2 IMPLANT
BAND RUBBER #18 3X1/16 STRL (MISCELLANEOUS) ×4 IMPLANT
BENZOIN TINCTURE PRP APPL 2/3 (GAUZE/BANDAGES/DRESSINGS) ×2 IMPLANT
BLADE CLIPPER SURG (BLADE) ×1 IMPLANT
BUR MATCHSTICK NEURO 3.0 LAGG (BURR) ×2 IMPLANT
BUR PRECISION FLUTE 6.0 (BURR) ×2 IMPLANT
CANISTER SUCT 3000ML PPV (MISCELLANEOUS) ×2 IMPLANT
CARTRIDGE OIL MAESTRO DRILL (MISCELLANEOUS) ×1 IMPLANT
DIFFUSER DRILL AIR PNEUMATIC (MISCELLANEOUS) ×2 IMPLANT
DRAPE LAPAROTOMY 100X72X124 (DRAPES) ×2 IMPLANT
DRAPE MICROSCOPE LEICA (MISCELLANEOUS) ×2 IMPLANT
DRAPE SURG 17X23 STRL (DRAPES) ×8 IMPLANT
DRSG OPSITE POSTOP 4X6 (GAUZE/BANDAGES/DRESSINGS) ×1 IMPLANT
ELECT BLADE 4.0 EZ CLEAN MEGAD (MISCELLANEOUS) ×2
ELECT REM PT RETURN 9FT ADLT (ELECTROSURGICAL) ×2
ELECTRODE BLDE 4.0 EZ CLN MEGD (MISCELLANEOUS) ×1 IMPLANT
ELECTRODE REM PT RTRN 9FT ADLT (ELECTROSURGICAL) ×1 IMPLANT
GAUZE 4X4 16PLY ~~LOC~~+RFID DBL (SPONGE) ×1 IMPLANT
GAUZE SPONGE 4X4 12PLY STRL (GAUZE/BANDAGES/DRESSINGS) ×2 IMPLANT
GLOVE BIO SURGEON STRL SZ8 (GLOVE) ×2 IMPLANT
GLOVE BIO SURGEON STRL SZ8.5 (GLOVE) ×2 IMPLANT
GLOVE BIOGEL PI IND STRL 6.5 (GLOVE) IMPLANT
GLOVE BIOGEL PI IND STRL 7.0 (GLOVE) IMPLANT
GLOVE BIOGEL PI IND STRL 7.5 (GLOVE) IMPLANT
GLOVE BIOGEL PI INDICATOR 6.5 (GLOVE) ×1
GLOVE BIOGEL PI INDICATOR 7.0 (GLOVE) ×1
GLOVE BIOGEL PI INDICATOR 7.5 (GLOVE) ×1
GLOVE ECLIPSE 6.5 STRL STRAW (GLOVE) ×1 IMPLANT
GLOVE EXAM NITRILE XL STR (GLOVE) IMPLANT
GLOVE SURG SS PI 6.5 STRL IVOR (GLOVE) ×2 IMPLANT
GOWN STRL REUS W/ TWL LRG LVL3 (GOWN DISPOSABLE) IMPLANT
GOWN STRL REUS W/ TWL XL LVL3 (GOWN DISPOSABLE) ×1 IMPLANT
GOWN STRL REUS W/TWL 2XL LVL3 (GOWN DISPOSABLE) IMPLANT
GOWN STRL REUS W/TWL LRG LVL3 (GOWN DISPOSABLE)
GOWN STRL REUS W/TWL XL LVL3 (GOWN DISPOSABLE) ×2
HEMOSTAT POWDER KIT SURGIFOAM (HEMOSTASIS) ×1 IMPLANT
KIT BASIN OR (CUSTOM PROCEDURE TRAY) ×2 IMPLANT
KIT TURNOVER KIT B (KITS) ×2 IMPLANT
NDL HYPO 21X1.5 SAFETY (NEEDLE) IMPLANT
NEEDLE HYPO 21X1.5 SAFETY (NEEDLE) IMPLANT
NEEDLE HYPO 22GX1.5 SAFETY (NEEDLE) ×2 IMPLANT
NS IRRIG 1000ML POUR BTL (IV SOLUTION) ×2 IMPLANT
OIL CARTRIDGE MAESTRO DRILL (MISCELLANEOUS) ×2
PACK LAMINECTOMY NEURO (CUSTOM PROCEDURE TRAY) ×2 IMPLANT
PAD ARMBOARD 7.5X6 YLW CONV (MISCELLANEOUS) ×6 IMPLANT
PATTIES SURGICAL .5 X1 (DISPOSABLE) ×1 IMPLANT
SPONGE SURGIFOAM ABS GEL SZ50 (HEMOSTASIS) ×2 IMPLANT
STRIP CLOSURE SKIN 1/2X4 (GAUZE/BANDAGES/DRESSINGS) ×2 IMPLANT
SUT PROLENE 6 0 BV (SUTURE) ×1 IMPLANT
SUT VIC AB 1 CT1 18XBRD ANBCTR (SUTURE) ×2 IMPLANT
SUT VIC AB 1 CT1 8-18 (SUTURE) ×2
SUT VIC AB 2-0 CP2 18 (SUTURE) ×4 IMPLANT
TOWEL GREEN STERILE (TOWEL DISPOSABLE) ×2 IMPLANT
TOWEL GREEN STERILE FF (TOWEL DISPOSABLE) ×2 IMPLANT
WATER STERILE IRR 1000ML POUR (IV SOLUTION) ×2 IMPLANT

## 2022-01-21 NOTE — Anesthesia Procedure Notes (Signed)
Procedure Name: Intubation Date/Time: 01/21/2022 7:40 AM  Performed by: Gwyndolyn Saxon, CRNAPre-anesthesia Checklist: Patient identified, Emergency Drugs available, Suction available and Patient being monitored Patient Re-evaluated:Patient Re-evaluated prior to induction Oxygen Delivery Method: Circle system utilized Preoxygenation: Pre-oxygenation with 100% oxygen Induction Type: IV induction Ventilation: Mask ventilation without difficulty Laryngoscope Size: Miller and 2 Grade View: Grade I Tube type: Oral Tube size: 7.5 mm Number of attempts: 1 Airway Equipment and Method: Patient positioned with wedge pillow and Stylet Placement Confirmation: ETT inserted through vocal cords under direct vision, positive ETCO2 and breath sounds checked- equal and bilateral Secured at: 23 cm Tube secured with: Tape Dental Injury: Teeth and Oropharynx as per pre-operative assessment

## 2022-01-21 NOTE — Transfer of Care (Signed)
Immediate Anesthesia Transfer of Care Note  Patient: Ryan [name]  Procedure(s) Performed: Lumbar Two-Three, Lumbar Three-Four/Lumbar Four-Five LAMINOTOMY/FORAMINOTOMY (Bilateral)  Patient Location: PACU  Anesthesia Type:General  Level of Consciousness: drowsy  Airway & Oxygen Therapy: Patient Spontanous Breathing and Patient connected to face mask oxygen  Post-op Assessment: Report given to RN and Post -op Vital signs reviewed and stable  Post vital signs: Reviewed and stable  Last Vitals:  Vitals Value Taken Time  BP 137/78 01/21/22 1023  Temp    Pulse 52 01/21/22 1024  Resp 14 01/21/22 1024  SpO2 100 % 01/21/22 1024  Vitals shown include unvalidated device data.  Last Pain:  Vitals:   01/21/22 0618  TempSrc:   PainSc: 2          Complications: No notable events documented.

## 2022-01-21 NOTE — Anesthesia Postprocedure Evaluation (Signed)
Anesthesia Post Note  Patient: Ryan [name]  Procedure(s) Performed: Lumbar Two-Three, Lumbar Three-Four/Lumbar Four-Five LAMINOTOMY/FORAMINOTOMY (Bilateral)     Patient location during evaluation: PACU Anesthesia Type: General Level of consciousness: awake and alert Pain management: pain level controlled Vital Signs Assessment: post-procedure vital signs reviewed and stable Respiratory status: spontaneous breathing, nonlabored ventilation and respiratory function stable Cardiovascular status: blood pressure returned to baseline and stable Postop Assessment: no apparent nausea or vomiting Anesthetic complications: no   No notable events documented.  Last Vitals:  Vitals:   01/21/22 1100 01/21/22 1115  BP: 131/81 (!) 142/79  Pulse: (!) 55 (!) 55  Resp: 15 15  Temp:    SpO2: 99% 99%    Last Pain:  Vitals:   01/21/22 1115  TempSrc:   PainSc: [place]

## 2022-01-21 NOTE — H&P (Signed)
Subjective: The patient is a 76 year old white male who is complaining of back and right leg pain consistent with neurogenic claudication.  He has failed medical management and was worked up with a lumbar MRI which demonstrated spinal stenosis at L2-3, L3-4 and L4-5 and a possible herniated on the right at L4-5.  I discussed the various treatment options with him.  He has decided to proceed with surgery.  Past Medical History:  Diagnosis Date   Allergy    seasonal   Anxiety    Arthritis    Asthma    Cataract    Coronary artery disease 12/08/2016   STEMI, DES Circumflex   GERD (gastroesophageal reflux disease)    Hyperlipidemia    Hypothyroidism    STEMI (ST elevation myocardial infarction) ([place]) 12/08/2016    Past Surgical History:  Procedure Laterality Date   cataract Bilateral 03/2016   COLONOSCOPY  05/31/2004   COLONOSCOPY  06/30/2018   epidural steroid shot     x 3 , lumbar spine area   EYE SURGERY     bilateral cataracts   LEFT HEART CATH AND CORONARY ANGIOGRAPHY N/A 12/08/2016   Procedure: Left Heart Cath and Coronary Angiography;  Surgeon: Sherren Mocha, MD;  Location: [facility];  Service: Cardiovascular;  Laterality: N/A;   POLYPECTOMY     SHOULDER SURGERY Right    Rotator Cuff Tear Repair   TONSILLECTOMY     UPPER GASTROINTESTINAL ENDOSCOPY      Allergies  Allergen Reactions   Contrast Media [Iodinated Contrast Media] Shortness Of Breath and Swelling    Swelling of the throat    Erythromycin Nausea And Vomiting   Niacin And Related Swelling   Penicillins Hives    Has patient had a PCN reaction causing immediate rash, facial/tongue/throat swelling, SOB or lightheadedness with hypotension: Y Has patient had a PCN reaction causing severe rash involving mucus membranes or skin necrosis: No Has patient had a PCN reaction that required hospitalization: No Has patient had a PCN reaction occurring within the last 10 years: No If all of the above answers are  "NO", then may proceed with Cephalosporin use.   Gabapentin Swelling    Severe swelling in legs    Social History   Tobacco Use   Smoking status: Never   Smokeless tobacco: Never  Substance Use Topics   Alcohol use: Yes    Alcohol/week: 3.0 standard drinks of alcohol    Types: 3 Glasses of wine per week    Comment: occasional    Family History  Problem Relation Age of Onset   Diabetes Mother    Heart failure Mother    Irritable bowel syndrome Mother    Diverticulosis Mother    Lung cancer Father        worked in Product/process development scientist yard   Heart disease Sister    Irritable bowel syndrome Sister    Diverticulosis Sister    Colon cancer Neg Hx    Esophageal cancer Neg Hx    Pancreatic cancer Neg Hx    Stomach cancer Neg Hx    Prior to Admission medications   Medication Sig Start Date End Date Taking? Authorizing Provider  acetaminophen (TYLENOL) 325 MG tablet Take 325-650 mg by mouth every 6 (six) hours as needed for mild pain or moderate pain.   Yes [provider]  aspirin EC 81 MG EC tablet Take 1 tablet (81 mg total) by mouth daily. 12/11/16  Yes Carlota Raspberry, Tiffany, PA-C  atorvastatin (LIPITOR) 80 MG  tablet Take 1 tablet (80 mg total) by mouth daily. 09/04/21  Yes Vivi Barrack, MD  bisacodyl (DULCOLAX) 5 MG EC tablet Take 5 mg by mouth daily as needed for moderate constipation.   Yes [provider]  Coenzyme Q10 200 MG capsule Take 200 mg by mouth daily.   Yes [provider]  fluorouracil (EFUDEX) 5 % cream Apply 1 application. topically daily as needed (skin cancer). 08/18/18  Yes [provider]  nitroGLYCERIN (NITROSTAT) 0.4 MG SL tablet Place 1 tablet (0.4 mg total) under the tongue every 5 (five) minutes as needed for chest pain. 07/03/21  Yes Sherren Mocha, MD  pantoprazole (PROTONIX) 20 MG tablet TAKE 1 TABLET BY MOUTH EVERY DAY Patient taking differently: Take 20 mg by mouth daily as needed for heartburn or indigestion. 09/11/21  Yes Vivi Barrack, MD  polyethylene glycol powder (GLYCOLAX/MIRALAX) powder Take 17 g by mouth as needed for mild constipation.    Yes [provider]  SYNTHROID 88 MCG tablet TAKE 1 TABLET BY MOUTH EVERY DAY 11/20/21  Yes Vivi Barrack, MD  tamsulosin (FLOMAX) 0.4 MG CAPS capsule TAKE 2 CAPSULES BY MOUTH EVERY DAY Patient taking differently: Take 0.4 mg by mouth daily. 05/31/21  Yes Vivi Barrack, MD  albuterol (VENTOLIN HFA) 108 (90 Base) MCG/ACT inhaler Inhale 2 puffs into the lungs every 4 (four) hours as needed for wheezing. 05/03/21   [name], PA  diazepam (VALIUM) 2 MG tablet Take 30 minutes prior to procedure Patient not taking: Reported on 01/10/2022 09/04/21   Vivi Barrack, MD  HYDROcodone bit-homatropine (HYCODAN) 5-1.5 MG/5ML syrup Take 5 mLs by mouth every 8 (eight) hours as needed for cough. 04/26/21   [name], FNP     Review of Systems  Positive ROS: As above  All other systems have been reviewed and were otherwise negative with the exception of those mentioned in the HPI and as above.  Objective: Vital signs in last 24 hours: Temp:  [98.3 F (36.8 C)] 98.3 F (36.8 C) (06/19 0556) Pulse Rate:  [54] 54 (06/19 0556) Resp:  [18] 18 (06/19 0556) BP: (156)/(83) 156/83 (06/19 0556) SpO2:  [97 %] 97 % (06/19 0556) Weight:  [98 kg] 98 kg (06/19 0556) Estimated body mass index is 29.29 kg/m as calculated from the following:   Height as of this encounter: 6' (1.829 m).   Weight as of this encounter: 98 kg.   General Appearance: Alert Head: Normocephalic, without obvious abnormality, atraumatic Eyes: PERRL, conjunctiva/corneas clear, EOM's intact,    Ears: Normal  Throat: Normal  Neck: Supple, Back: unremarkable Lungs: Clear to auscultation bilaterally, respirations unlabored Heart: Regular rate and rhythm, no murmur, rub or gallop Abdomen: Soft, non-tender Extremities: Extremities normal, atraumatic, no cyanosis or edema Skin:  unremarkable  NEUROLOGIC:   Mental status: alert and oriented,Motor Exam - grossly normal Sensory Exam - grossly normal Reflexes:  Coordination - grossly normal Gait - grossly normal Balance - grossly normal Cranial Nerves: I: smell Not tested  II: visual acuity  OS: Normal  OD: Normal   II: visual fields Full to confrontation  II: pupils Equal, round, reactive to light  III,VII: ptosis None  III,IV,VI: extraocular muscles  Full ROM  V: mastication Normal  V: facial light touch sensation  Normal  V,VII: corneal reflex  Present  VII: facial muscle function - upper  Normal  VII: facial muscle function - lower Normal  VIII: hearing Not tested  IX: soft palate  elevation  Normal  IX,X: gag reflex Present  XI: trapezius strength  5/5  XI: sternocleidomastoid strength 5/5  XI: neck flexion strength  5/5  XII: tongue strength  Normal    Data Review Lab Results  Component Value Date   WBC 7.2 01/14/2022   HGB 14.4 01/14/2022   HCT 43.3 01/14/2022   MCV 94.7 01/14/2022   PLT 257 01/14/2022   Lab Results  Component Value Date   NA 139 01/14/2022   K 4.0 01/14/2022   CL 103 01/14/2022   CO2 30 01/14/2022   BUN 17 01/14/2022   CREATININE 0.92 01/14/2022   GLUCOSE 101 (H) 01/14/2022   Lab Results  Component Value Date   INR 1.04 12/17/2016    Assessment/Plan: Lumbar spinal stenosis, lumbar radiculopathy, lumbago: I have discussed the situation with the patient.  I reviewed his imaging studies with him and pointed out the abnormalities.  We have discussed the various treatment options including surgery.  I have described the surgical treatment option of an L2-3 L3-4 and L4-5 bilateral laminotomies, foraminotomies and possible discectomy.  I have shown him surgical models.  I have given him a surgical pamphlet.  We have discussed the risk, benefits, alternatives, expected postoperative course, and likelihood of achieving our goals with surgery.  I have answered all his  questions.  He has decided proceed with surgery.   SHAMON COTHRAN 01/21/2022 7:25 AM

## 2022-01-21 NOTE — Progress Notes (Signed)
Pharmacy Antibiotic Note  Ryan [name] is a  76 year old white male who is complained of back and right leg pain consistent with a lumbar radiculopathy now s/p bilateral L2-3, L3-4 and L4-5 laminotomy/foraminotomies. Pharmacy has been consulted for vancomycin dosing. No drains placed during procedure per Op note.   Plan: Vancomycin '1000mg'$  IV x1 for surgical prophylaxis.  Height: 6' (182.9 cm) Weight: 98 kg (216 lb) IBW/kg (Calculated) : 77.6  Temp (24hrs), Avg:97.8 F (36.6 C), Min:97.5 F (36.4 C), Max:98.3 F (36.8 C)  No results for input(s): "WBC", "CREATININE", "LATICACIDVEN", "VANCOTROUGH", "VANCOPEAK", "VANCORANDOM", "GENTTROUGH", "GENTPEAK", "GENTRANDOM", "TOBRATROUGH", "TOBRAPEAK", "TOBRARND", "AMIKACINPEAK", "AMIKACINTROU", "AMIKACIN" in the last 168 hours.  Estimated Creatinine Clearance: 81.6 mL/min (by C-G formula based on SCr of 0.92 mg/dL).    Allergies  Allergen Reactions   Contrast Media [Iodinated Contrast Media] Shortness Of Breath and Swelling    Swelling of the throat    Erythromycin Nausea And Vomiting   Niacin And Related Swelling   Penicillins Hives    Has patient had a PCN reaction causing immediate rash, facial/tongue/throat swelling, SOB or lightheadedness with hypotension: Y Has patient had a PCN reaction causing severe rash involving mucus membranes or skin necrosis: No Has patient had a PCN reaction that required hospitalization: No Has patient had a PCN reaction occurring within the last 10 years: No If all of the above answers are "NO", then may proceed with Cephalosporin use.   Gabapentin Swelling    Severe swelling in legs    Thank you for allowing pharmacy to be a part of this patient's care.  Ardyth Harps, PharmD Clinical Pharmacist

## 2022-01-21 NOTE — Op Note (Signed)
Brief history: The patient is a 76 year old white male who is complained of back and right leg pain consistent with a lumbar radiculopathy.  He has failed medical management.  He was worked up with a lumbar MRI.  This demonstrated the patient spinal stenosis at L2-3, L3-4 and L4-5.  I discussed the various treatment options with him.  He has decided proceed with surgery.  Preoperative diagnosis: Lumbar spinal stenosis, lumbar radiculopathy, lumbago, neurogenic claudication  Postoperative diagnosis: The same  Procedure: Bilateral L2-3, L3-4 and L4-5 laminotomy/foraminotomies using micro-dissection  Surgeon: Dr. Earle Gell  Asst.: Arnetha Massy, NP  Anesthesia: Gen. endotracheal  Estimated blood loss: 100 cc  Drains: None  Complications: None  Description of procedure: The patient was brought to the operating room by the anesthesia team. General endotracheal anesthesia was induced. The patient was turned to the prone position on the Wilson frame. The patient's lumbosacral region was then prepared with Betadine scrub and Betadine solution. Sterile drapes were applied.  I then injected the area to be incised with Marcaine with epinephrine solution. I then used a scalpel to make a linear midline incision over the L2-3, L3-4 and L4-5 intervertebral disc space. I then used electrocautery to perform a right sided subperiosteal dissection exposing the spinous process and lamina of L2, L3, L4 and L5. We obtained intraoperative radiograph to confirm our location. I then inserted the [facility] retractor for exposure.  We then brought the operative microscope into the field. Under its magnification and illumination we completed the microdissection. I used a high-speed drill to perform a laminotomy at L2-3, L3-4 and L4-5 on the right. I then used a Kerrison punches to widen the laminotomy and removed the ligamentum flavum at L2-3, L3-4 and L4-5 on the right. We then used microdissection to free up the  thecal sac and the right L3, L4 and L5 nerve root from the epidural tissue. I then used a Kerrison punch to perform a foraminotomy at about the right L3, L4 and L5 nerve root.  I then used a high-speed drill to drill across the midline.  We used a Kerrison punch to remove the left L2-3, L3 3 4 and L4-5 ligamentum flavum and performed foraminotomies about the left L3, L4 and L5 nerve roots.  I created a small durotomy on the right at L3-4.  I repaired this with a single 6-0 Prolene figure-of-eight stitch.  We inspected the rectum disc at L to 3, 3 4 and L4-5 bilaterally.  I did not see any significant herniations.  I then palpated along the ventral surface of the thecal sac and along exit route of the bilateral L3, L4 and L5 nerve root and noted that the neural structures were well decompressed. This completed the decompression.  We then obtained hemostasis using bipolar electrocautery. We irrigated the wound out with bacitracin solution. We then removed the retractor. We then reapproximated the patient's thoracolumbar fascia with interrupted #1 Vicryl suture. We then reapproximated the patient's subcutaneous tissue with interrupted 2-0 Vicryl suture. We then reapproximated patient's skin with Steri-Strips and benzoin. The was then coated with bacitracin ointment. The drapes were removed. The patient was subsequently returned to the supine position where they were extubated by the anesthesia team. The patient was then transported to the postanesthesia care unit in stable condition. All sponge instrument and needle counts were reportedly correct at the end of this case.

## 2022-01-22 ENCOUNTER — Encounter (HOSPITAL_COMMUNITY): Payer: Self-pay | Admitting: Neurosurgery

## 2022-01-22 DIAGNOSIS — M48062 Spinal stenosis, lumbar region with neurogenic claudication: Secondary | ICD-10-CM | POA: Diagnosis not present

## 2022-01-22 MED ORDER — CYCLOBENZAPRINE HCL 5 MG PO TABS: 5.0000 mg | ORAL_TABLET | Freq: Three times a day (TID) | ORAL | 0 refills | Status: AC | PRN

## 2022-01-22 MED ORDER — OXYCODONE-ACETAMINOPHEN 5-325 MG PO TABS
1.0000 | ORAL_TABLET | ORAL | Status: DC | PRN
  Administered 2022-01-22: 2 via ORAL
  Filled 2022-01-22: qty 2

## 2022-01-22 MED ORDER — OXYCODONE-ACETAMINOPHEN 5-325 MG PO TABS: 1.0000 | ORAL_TABLET | ORAL | 0 refills | Status: DC | PRN

## 2022-01-22 MED ORDER — CYCLOBENZAPRINE HCL 5 MG PO TABS: 5.0000 mg | ORAL_TABLET | Freq: Three times a day (TID) | ORAL | Status: DC | PRN

## 2022-01-22 MED FILL — Thrombin For Soln 5000 Unit: CUTANEOUS | Qty: 2 | Status: AC

## 2022-01-22 NOTE — Evaluation (Signed)
Physical Therapy Evaluation Patient Details Name: Ryan [name] MRN: [id number] DOB: [birth year] Today's Date: 01/22/2022  History of Present Illness  Pt is a 76 y/o M s/p bilateral L2-3, L3-4, L4-5 laminectomy/foraminectomy on 6/19. PMH includes allergy, arthritis, cataracts, asthma, CAD, GERD, HLD, hypothyroidism, and STEMI.  Clinical Impression  PTA pt living with wife in single story home with 5 steps to enter. Pt reports independence with ambulation and ADLs, and iADLS. Pt limited in safe mobility by onset of L LE muscle weakness (with onset of knee buckling and decreasing dorsiflexion) and R LE pain with progression of ambulation. Pt is supervision for bed mobility, min guard for transfers and ambulation and min A for stair training.  PT recommending HHPT at discharge to address above limitations. Pt to discharge home today.      Recommendations for follow up therapy are one component of a multi-disciplinary discharge planning process, led by the attending physician.  Recommendations may be updated based on patient status, additional functional criteria and insurance authorization.  Follow Up Recommendations Home health PT    Assistance Recommended at Discharge Intermittent Supervision/Assistance  Patient can return home with the following  A little help with bathing/dressing/bathroom;A little help with walking and/or transfers;Assistance with cooking/housework;Help with stairs or ramp for entrance;Assist for transportation    Equipment Recommendations None recommended by PT     Functional Status Assessment Patient has had a recent decline in their functional status and demonstrates the ability to make significant improvements in function in a reasonable and predictable amount of time.     Precautions / Restrictions Precautions Precautions: Back Precaution Booklet Issued: Yes (comment) Precaution Comments: provide earlier by OT able to recall 2/3 precautions reviewed with pt and  able to recall 3/3 precautions at end of session Required Braces or Orthoses: Other Brace Other Brace: no brace needed per MD Restrictions Weight Bearing Restrictions: No      Mobility  Bed Mobility Overal bed mobility: Modified Independent Bed Mobility: Rolling, Sidelying to Sit Rolling: Supervision Sidelying to sit: Supervision       General bed mobility comments: pt initially attempts to come to EoB with HoB elevated, educated on need to get up from flattened bed to reduce bending of spine, once HoB lowered able to come to EoB with supervision    Transfers Overall transfer level: Needs assistance Equipment used: Rolling walker (2 wheels) Transfers: Sit to/from Stand Sit to Stand: Min guard           General transfer comment: vc for pushing up from bed and then reaching to RW    Ambulation/Gait Ambulation/Gait assistance: Min guard Gait Distance (Feet): 100 Feet Assistive device: Rolling walker (2 wheels) Gait Pattern/deviations: Step-through pattern, Decreased step length - right, Decreased step length - left, Decreased dorsiflexion - left Gait velocity: variable Gait velocity interpretation: <1.8 ft/sec, indicate of risk for recurrent falls   General Gait Details: pt begins ambulation with strong gait, as progresses pt with increased L LE buckling and decreased dorsiflexion, pt also reports "sciatic" pain in R LE, gait with progressive unsteadiness, returned to room for seated rest break before stair training  Stairs Stairs: Yes Stairs assistance: Min assist Stair Management: One rail Right, One rail Left, Backwards, Forwards Number of Stairs: 1 General stair comments: 2 bouts of 5 steps up and back, pt with increasing difficulty with clearance of L foot with fatigue, discussed need for safety and sequencing with up with R LE and down with L LE, advised slowed stair navigation  and sure footedness prior to advancing to next step        Balance Overall balance  assessment: Mild deficits observed, not formally tested (requires increased supervision with dynamic movement)                                           Pertinent Vitals/Pain Pain Assessment Pain Assessment: Faces Faces Pain Scale: Hurts a little bit Pain Location: low back Pain Descriptors / Indicators: Discomfort Pain Intervention(s): Limited activity within patient's tolerance, Monitored during session, Repositioned    Home Living Family/patient expects to be discharged to:: Private residence Living Arrangements: Spouse/significant other Available Help at Discharge: Family Type of Home: House Home Access: Stairs to enter   Technical brewer of Steps: 5     Home Equipment: Shower seat - built Medical sales representative (2 wheels) Additional Comments: also [place] the [place] 2 [place], has ?15+ steps    Prior Function Prior Level of Function : Independent/Modified Independent             Mobility Comments: no AD use ADLs Comments: does IADLs        Extremity/Trunk Assessment   Upper Extremity Assessment Upper Extremity Assessment: Defer to OT evaluation    Lower Extremity Assessment Lower Extremity Assessment: Generalized weakness    Cervical / Trunk Assessment Cervical / Trunk Assessment: Back Surgery  Communication   Communication: No difficulties  Cognition Arousal/Alertness: Awake/alert Behavior During Therapy: WFL for tasks assessed/performed Overall Cognitive Status: Within Functional Limits for tasks assessed                                          General Comments General comments (skin integrity, edema, etc.): VSS on RA        Assessment/Plan    PT Assessment Patient needs continued PT services  PT Problem List Decreased activity tolerance;Decreased balance;Pain;Decreased mobility       PT Treatment Interventions DME instruction;Gait training;Stair training;Functional  mobility training;Therapeutic activities;Therapeutic exercise;Balance training;Cognitive remediation;Patient/family education;Neuromuscular re-education    PT Goals (Current goals can be found in the Care Plan section)  Acute Rehab PT Goals Patient Stated Goal: go to the beach PT Goal Formulation: With patient Time For Goal Achievement: 02/05/22 Potential to Achieve Goals: Fair    Frequency Min 3X/week        AM-PAC PT "6 Clicks" Mobility  Outcome Measure Help needed turning from your back to your side while in a flat bed without using bedrails?: None Help needed moving from lying on your back to sitting on the side of a flat bed without using bedrails?: None Help needed moving to and from a bed to a chair (including a wheelchair)?: None Help needed standing up from a chair using your arms (e.g., wheelchair or bedside chair)?: None Help needed to walk in hospital room?: A Little Help needed climbing 3-5 steps with a railing? : A Little 6 Click Score: 22    End of Session Equipment Utilized During Treatment: Gait belt Activity Tolerance: Patient limited by fatigue Patient left: in bed;with call bell/phone within reach Nurse Communication: Mobility status PT Visit Diagnosis: Unsteadiness on feet (R26.81);Other abnormalities of gait and mobility (R26.89);Pain Pain - Right/Left: Right Pain - part of body: Leg  Time: 3009-2330 PT Time Calculation (min) (ACUTE ONLY): 39 min   Charges:   PT Evaluation $PT Eval Moderate Complexity: 1 Mod PT Treatments $Gait Training: 8-22 mins $Therapeutic Activity: 8-22 mins        Latitia Housewright B. Migdalia Dk PT, DPT Acute Rehabilitation Services Please use secure chat or  Call Office [phone]   [place] 01/22/2022, 12:13 PM

## 2022-01-22 NOTE — Progress Notes (Signed)
Patient alert and oriented, mae's well, voiding adequate amount of urine, swallowing without difficulty, no c/o pain at time of discharge. Patient discharged home with family. Script and discharged instructions given to patient. Patient and family stated understanding of instructions given. Patient has an appointment with Dr. Jenkins in 3 weeks 

## 2022-01-22 NOTE — Discharge Summary (Signed)
Physician Discharge Summary  Patient ID: Ryan [name] MRN: [id number] DOB/AGE: 1946-05-23 76 y.o.  Admit date: 01/21/2022 Discharge date: 01/22/2022  Admission Diagnoses: Lumbar spinal stenosis, lumbago, lumbar radiculopathy, neurogenic claudication  Discharge Diagnoses: The same Principal Problem:   Lumbar stenosis with neurogenic claudication   Discharged Condition: good  Hospital Course: I performed bilateral L2-3, L3-4 and L4-5 laminotomy/foraminotomies on the patient on 01/21/2022.  The patient's postoperative course was unremarkable.  On postoperative day #1 he felt much better, his leg pain was gone, and he requested discharge to home.  He was given written and verbal discharge instructions.  All his questions were answered.  Consults: PT, OT, care management Significant Diagnostic Studies: None Treatments: Bilateral L2-3, L3-4 and L4-5 laminotomy/foraminotomies using microdissection Discharge Exam: Blood pressure 121/79, pulse (!) 57, temperature 98.4 F (36.9 C), temperature source Oral, resp. rate 20, height 6' (1.829 m), weight 98 kg, SpO2 96 %. The patient is alert and pleasant.  His dressing has a small bloodstain.  His strength is normal.  He looks well.  Disposition: Home  Discharge Instructions     Call MD for:  difficulty breathing, headache or visual disturbances   Complete by: As directed    Call MD for:  extreme fatigue   Complete by: As directed    Call MD for:  hives   Complete by: As directed    Call MD for:  persistant dizziness or light-headedness   Complete by: As directed    Call MD for:  persistant nausea and vomiting   Complete by: As directed    Call MD for:  redness, tenderness, or signs of infection (pain, swelling, redness, odor or green/yellow discharge around incision site)   Complete by: As directed    Call MD for:  severe uncontrolled pain   Complete by: As directed    Call MD for:  temperature >100.4   Complete by: As directed     Diet - low sodium heart healthy   Complete by: As directed    Discharge instructions   Complete by: As directed    Call [phone] for a followup appointment. Take a stool softener while you are using pain medications.   Driving Restrictions   Complete by: As directed    Do not drive for 2 weeks.   Increase activity slowly   Complete by: As directed    Lifting restrictions   Complete by: As directed    Do not lift more than 5 pounds. No excessive bending or twisting.   May shower / Bathe   Complete by: As directed    Remove the dressing for 3 days after surgery.  You may shower, but leave the incision alone.   Remove dressing in 48 hours   Complete by: As directed       Allergies as of 01/22/2022       Reactions   Contrast Media [iodinated Contrast Media] Shortness Of Breath, Swelling   Swelling of the throat    Erythromycin Nausea And Vomiting   Niacin And Related Swelling   Penicillins Hives   Has patient had a PCN reaction causing immediate rash, facial/tongue/throat swelling, SOB or lightheadedness with hypotension: Y Has patient had a PCN reaction causing severe rash involving mucus membranes or skin necrosis: No Has patient had a PCN reaction that required hospitalization: No Has patient had a PCN reaction occurring within the last 10 years: No If all of the above answers are "NO", then may proceed with Cephalosporin use.  Gabapentin Swelling   Severe swelling in legs        Medication List     STOP taking these medications    acetaminophen 325 MG tablet Commonly known as: TYLENOL   diazepam 2 MG tablet Commonly known as: Valium   HYDROcodone bit-homatropine 5-1.5 MG/5ML syrup Commonly known as: HYCODAN       TAKE these medications    albuterol 108 (90 Base) MCG/ACT inhaler Commonly known as: VENTOLIN HFA Inhale 2 puffs into the lungs every 4 (four) hours as needed for wheezing.   aspirin EC 81 MG tablet Take 1 tablet (81 mg total) by mouth  daily.   atorvastatin 80 MG tablet Commonly known as: LIPITOR Take 1 tablet (80 mg total) by mouth daily.   bisacodyl 5 MG EC tablet Commonly known as: DULCOLAX Take 5 mg by mouth daily as needed for moderate constipation.   Coenzyme Q10 200 MG capsule Take 200 mg by mouth daily.   cyclobenzaprine 5 MG tablet Commonly known as: FLEXERIL Take 1 tablet (5 mg total) by mouth 3 (three) times daily as needed for muscle spasms.   fluorouracil 5 % cream Commonly known as: EFUDEX Apply 1 application. topically daily as needed (skin cancer).   nitroGLYCERIN 0.4 MG SL tablet Commonly known as: NITROSTAT Place 1 tablet (0.4 mg total) under the tongue every 5 (five) minutes as needed for chest pain.   oxyCODONE-acetaminophen 5-325 MG tablet Commonly known as: PERCOCET/ROXICET Take 1-2 tablets by mouth every 4 (four) hours as needed for moderate pain.   pantoprazole 20 MG tablet Commonly known as: PROTONIX TAKE 1 TABLET BY MOUTH EVERY DAY What changed:  when to take this reasons to take this   polyethylene glycol powder 17 GM/SCOOP powder Commonly known as: GLYCOLAX/MIRALAX Take 17 g by mouth as needed for mild constipation.   Synthroid 88 MCG tablet Generic drug: levothyroxine TAKE 1 TABLET BY MOUTH EVERY DAY   tamsulosin 0.4 MG Caps capsule Commonly known as: FLOMAX TAKE 2 CAPSULES BY MOUTH EVERY DAY What changed: how much to take         Signed: KHYLER ESCHMANN 01/22/2022, 7:38 AM

## 2022-01-22 NOTE — Evaluation (Signed)
Occupational Therapy Evaluation Patient Details Name: Ryan [name] MRN: [id number] DOB: Jun 05, 1946 Today's Date: 01/22/2022   History of Present Illness Pt is a 76 y/o M s/p bilateral L2-3, L3-4, L4-5 laminectomy/foraminectomy on 6/19. PMH includes allergy, arthritis, cataracts, asthma, CAD, GERD, HLD, hypothyroidism, and STEMI.   Clinical Impression   Pt independent at baseline with ADLs and functional mobility, lives with spouse who can provide assist at d/c. Pt currently needing mod I -min A for ADLs and close min guard for transfers. Pt utilizing RW and holding onto walls/railings for support during ambulation/static standing. Pt educated on precautions, compensatory strategies for ADLs and bed mobility during session. Requires increased cues for precautions, would benefit from reinforcement. Pt presenting with impairments listed below, will follow acutely. Recommend HHOT at d/c.     Recommendations for follow up therapy are one component of a multi-disciplinary discharge planning process, led by the attending physician.  Recommendations may be updated based on patient status, additional functional criteria and insurance authorization.   Follow Up Recommendations  Home health OT    Assistance Recommended at Discharge Intermittent Supervision/Assistance  Patient can return home with the following A little help with walking and/or transfers;A little help with bathing/dressing/bathroom;Assistance with cooking/housework;Assist for transportation;Help with stairs or ramp for entrance    Functional Status Assessment  Patient has had a recent decline in their functional status and demonstrates the ability to make significant improvements in function in a reasonable and predictable amount of time.  Equipment Recommendations  None recommended by OT (pt has all needed DME)    Recommendations for Other Services PT consult     Precautions / Restrictions Precautions Precautions:  Back Precaution Booklet Issued: Yes (comment) Precaution Comments: educated pt on 3/3 precautions Required Braces or Orthoses: Other Brace Other Brace: no brace needed per MD Restrictions Weight Bearing Restrictions: No      Mobility Bed Mobility Overal bed mobility: Needs Assistance             General bed mobility comments: pt able to verbalize bed mobility, log rolling technique states he has been completing bed mobility that way with NSG yesterday and today    Transfers Overall transfer level: Needs assistance   Transfers: Sit to/from Stand Sit to Stand: Min guard           General transfer comment: close min guard, reaches for walls/railings for support      Balance Overall balance assessment: Mild deficits observed, not formally tested                                         ADL either performed or assessed with clinical judgement   ADL Overall ADL's : Needs assistance/impaired Eating/Feeding: Modified independent;Sitting   Grooming: Modified independent;Sitting   Upper Body Bathing: Min guard;Sitting   Lower Body Bathing: Minimal assistance;Sitting/lateral leans   Upper Body Dressing : Min guard;Sitting   Lower Body Dressing: Minimal assistance;Sitting/lateral leans   Toilet Transfer: Min guard;Ambulation;Regular Museum/gallery exhibitions officer and Hygiene: Supervision/safety       Functional mobility during ADLs: Min guard       Vision   Vision Assessment?: No apparent visual deficits     Perception     Praxis      Pertinent Vitals/Pain Pain Assessment Pain Assessment: Faces Pain Score: 2  Faces Pain Scale: Hurts a little bit Pain Location: low back Pain  Descriptors / Indicators: Discomfort Pain Intervention(s): Limited activity within patient's tolerance, Monitored during session     Hand Dominance     Extremity/Trunk Assessment Upper Extremity Assessment Upper Extremity Assessment: Overall WFL  for tasks assessed   Lower Extremity Assessment Lower Extremity Assessment: Defer to PT evaluation   Cervical / Trunk Assessment Cervical / Trunk Assessment: Back Surgery   Communication Communication Communication: No difficulties   Cognition Arousal/Alertness: Awake/alert Behavior During Therapy: WFL for tasks assessed/performed Overall Cognitive Status: Within Functional Limits for tasks assessed                                       General Comments  VSS onRA    Exercises     Shoulder Instructions      Home Living Family/patient expects to be discharged to:: Private residence Living Arrangements: Spouse/significant other Available Help at Discharge: Family Type of Home: House Home Access: Stairs to enter Technical brewer of Steps: 5         Bathroom Shower/Tub: Occupational psychologist: Handicapped height     Home Equipment: Shower seat - built Medical sales representative (2 wheels)   Additional Comments: also plans to [place] in the [place] 2 weeks, has ?15+ steps      Prior Functioning/Environment Prior Level of Function : Independent/Modified Independent             Mobility Comments: no AD use ADLs Comments: does IADLs        OT Problem List: Decreased range of motion;Decreased activity tolerance;Impaired balance (sitting and/or standing);Decreased safety awareness;Decreased knowledge of precautions      OT Treatment/Interventions: Self-care/ADL training;Therapeutic exercise;Energy conservation;Therapeutic activities;Patient/family education;Balance training    OT Goals(Current goals can be found in the care plan section) Acute Rehab OT Goals Patient Stated Goal: none stated OT Goal Formulation: With patient Time For Goal Achievement: 02/05/22 Potential to Achieve Goals: Good ADL Goals Pt Will Perform Lower Body Dressing: Independently;sit to/from stand;sitting/lateral leans Pt Will Perform  Tub/Shower Transfer: Shower transfer;shower seat;rolling walker;ambulating;Independently Additional ADL Goal #1: pt will complete bed mobility mod I using log rolling technique in prep for ADLs  OT Frequency: Min 2X/week    Co-evaluation              AM-PAC OT "6 Clicks" Daily Activity     Outcome Measure Help from another person eating meals?: None Help from another person taking care of personal grooming?: None Help from another person toileting, which includes using toliet, bedpan, or urinal?: None Help from another person bathing (including washing, rinsing, drying)?: A Little Help from another person to put on and taking off regular upper body clothing?: None Help from another person to put on and taking off regular lower body clothing?: A Little 6 Click Score: 22   End of Session Nurse Communication: Mobility status  Activity Tolerance: Patient tolerated treatment well Patient left: in bed;with call bell/phone within reach  OT Visit Diagnosis: Unsteadiness on feet (R26.81);Other abnormalities of gait and mobility (R26.89);Muscle weakness (generalized) (M62.81)                Time: 1443-1540 OT Time Calculation (min): 25 min Charges:  OT General Charges $OT Visit: 1 Visit OT Evaluation $OT Eval Low Complexity: 1 Low OT Treatments $Self Care/Home Management : 8-22 mins  Livie Vanderhoof, OTD, OTR/L Acute Rehab [phone]  Kaylyn Lim 01/22/2022, 10:22 AM

## 2022-01-22 NOTE — TOC Transition Note (Signed)
Transition of Care [facility]) - CM/SW Discharge Note   Patient Details  Name: Ryan [name] MRN: [id number] Date of Birth: 1946/02/19  Transition of Care [facility]) CM/SW Contact:  Pollie Friar, RN Phone Number: 01/22/2022, 11:06 AM   Clinical Narrative:    Pt is discharging home with home health services through [facility] [place] that were pre-arranged.  Pt has transport home. Any DME needs the bedside RN will manage.    Final next level of care: [place] Barriers to Discharge: No Barriers Identified   Patient Goals and CMS Choice        Discharge Placement                       Discharge Plan and Services                          HH Arranged: PT, OT HH Agency: [facility]        Social Determinants of Health (SDOH) Interventions     Readmission Risk Interventions     No data to display

## 2022-01-27 ENCOUNTER — Emergency Department (HOSPITAL_COMMUNITY): Payer: Medicare Other

## 2022-01-27 ENCOUNTER — Encounter (HOSPITAL_COMMUNITY): Payer: Self-pay | Admitting: Emergency Medicine

## 2022-01-27 ENCOUNTER — Emergency Department (HOSPITAL_COMMUNITY)
Admission: EM | Admit: 2022-01-27 | Discharge: 2022-01-28 | Disposition: A | Discharge status: Home / Self Care | Payer: Medicare Other | Attending: Emergency Medicine | Admitting: Emergency Medicine

## 2022-01-27 ENCOUNTER — Other Ambulatory Visit: Payer: Self-pay

## 2022-01-27 DIAGNOSIS — Z7982 Long term (current) use of aspirin: Secondary | ICD-10-CM | POA: Diagnosis not present

## 2022-01-27 DIAGNOSIS — T402X5A Adverse effect of other opioids, initial encounter: Secondary | ICD-10-CM

## 2022-01-27 DIAGNOSIS — Z79899 Other long term (current) drug therapy: Secondary | ICD-10-CM | POA: Insufficient documentation

## 2022-01-27 DIAGNOSIS — K5903 Drug induced constipation: Secondary | ICD-10-CM | POA: Diagnosis not present

## 2022-01-27 DIAGNOSIS — K5900 Constipation, unspecified: Secondary | ICD-10-CM | POA: Diagnosis present

## 2022-01-27 LAB — COMPREHENSIVE METABOLIC PANEL
ALT: 34 U/L (ref 0–44)
AST: 25 U/L (ref 15–41)
Albumin: 3.7 g/dL (ref 3.5–5.0)
Alkaline Phosphatase: 74 U/L (ref 38–126)
Anion gap: 6 (ref 5–15)
BUN: 13 mg/dL (ref 8–23)
CO2: 27 mmol/L (ref 22–32)
Calcium: 9.3 mg/dL (ref 8.9–10.3)
Chloride: 107 mmol/L (ref 98–111)
Creatinine, Ser: 0.88 mg/dL (ref 0.61–1.24)
GFR, Estimated: >60 mL/min (ref >60)
Glucose, Bld: 104 mg/dL — ABNORMAL HIGH (ref 70–99)
Potassium: 3.9 mmol/L (ref 3.5–5.1)
Sodium: 140 mmol/L (ref 135–145)
Total Bilirubin: 0.7 mg/dL (ref 0.3–1.2)
Total Protein: 6.6 g/dL (ref 6.5–8.1)

## 2022-01-27 LAB — CBC
HCT: 40.2 % (ref 39.0–52.0)
Hemoglobin: 13.5 g/dL (ref 13.0–17.0)
MCH: 31.8 pg (ref 26.0–34.0)
MCHC: 33.6 g/dL (ref 30.0–36.0)
MCV: 94.6 fL (ref 80.0–100.0)
Platelets: 302 10*3/uL (ref 150–400)
RBC: 4.25 MIL/uL (ref 4.22–5.81)
RDW: 13.2 % (ref 11.5–15.5)
WBC: 8 10*3/uL (ref 4.0–10.5)
nRBC: 0 % (ref 0.0–0.2)

## 2022-01-27 LAB — URINALYSIS, ROUTINE W REFLEX MICROSCOPIC
Bilirubin Urine: NEGATIVE
Glucose, UA: NEGATIVE mg/dL
Hgb urine dipstick: NEGATIVE
Ketones, ur: NEGATIVE mg/dL
Leukocytes,Ua: NEGATIVE
Nitrite: NEGATIVE
Protein, ur: NEGATIVE mg/dL
Specific Gravity, Urine: 1.01 (ref 1.005–1.030)
pH: 6 (ref 5.0–8.0)

## 2022-01-27 LAB — LIPASE, BLOOD: Lipase: 26 U/L (ref 11–51)

## 2022-01-27 MED ORDER — GLYCERIN (LAXATIVE) 2.1 G RE SUPP
1.0000 | Freq: Once | RECTAL | Status: AC
  Administered 2022-01-27: 1 via RECTAL
  Filled 2022-01-27: qty 1

## 2022-01-27 MED ORDER — ACETAMINOPHEN 500 MG PO TABS
1000.0000 mg | ORAL_TABLET | Freq: Once | ORAL | Status: AC
  Administered 2022-01-27: 1000 mg via ORAL
  Filled 2022-01-27: qty 2

## 2022-01-27 MED ORDER — MINERAL OIL RE ENEM
1.0000 | ENEMA | Freq: Once | RECTAL | Status: AC
  Administered 2022-01-27: 1 via RECTAL
  Filled 2022-01-27: qty 1

## 2022-01-27 MED ORDER — METHYLNALTREXONE BROMIDE 12 MG/0.6ML ~~LOC~~ SOLN
12.0000 mg | Freq: Once | SUBCUTANEOUS | Status: AC
Start: 2022-01-27 — End: 2022-01-27
  Administered 2022-01-27: 12 mg via SUBCUTANEOUS
  Filled 2022-01-27: qty 0.6

## 2022-03-05 ENCOUNTER — Telehealth: Payer: Medicare Other

## 2022-03-13 ENCOUNTER — Encounter: Payer: Self-pay | Admitting: Cardiovascular Disease

## 2022-03-13 ENCOUNTER — Ambulatory Visit (INDEPENDENT_AMBULATORY_CARE_PROVIDER_SITE_OTHER): Payer: Medicare Other | Admitting: Cardiovascular Disease

## 2022-03-13 VITALS — BP 130/82 | HR 56 | Ht 72.0 in | Wt 220.8 lb

## 2022-03-13 DIAGNOSIS — E782 Mixed hyperlipidemia: Secondary | ICD-10-CM

## 2022-03-13 DIAGNOSIS — I2510 Atherosclerotic heart disease of native coronary artery without angina pectoris: Secondary | ICD-10-CM

## 2022-03-13 DIAGNOSIS — I10 Essential (primary) hypertension: Secondary | ICD-10-CM | POA: Diagnosis not present

## 2022-03-13 MED ORDER — ATORVASTATIN CALCIUM 80 MG PO TABS: 80.0000 mg | ORAL_TABLET | Freq: Every day | ORAL | 3 refills | Status: DC

## 2022-03-13 MED ORDER — NITROGLYCERIN 0.4 MG SL SUBL
0.4000 mg | SUBLINGUAL_TABLET | SUBLINGUAL | 5 refills | Status: DC | PRN
Start: 2022-03-13 — End: 2023-03-26

## 2022-03-13 NOTE — Progress Notes (Signed)
Cardiology Office Note:    Date:  03/13/2022   ID:  Ryan [name], DOB 1945/08/14, MRN [id number]  PCP:  Vivi Barrack, [facility] [facility] Cardiologist:  Sherren Mocha, MD     Referring MD: Vivi Barrack, MD   Chief Complaint  Patient presents with   Coronary Artery Disease    History of Present Illness:    Ryan [name] is a 76 y.o. male with a hx of coronary artery disease, presenting for follow-up evaluation.  The patient initially presented in 2018 with a STEMI involving the left circumflex and he was treated with primary PCI using a drug-eluting stent.  He has done well since then with no recurrent angina or ischemic events.  The patient has been intolerant to beta-blockade because of bradycardia.  His lipids have been at goal on a high intensity statin drug.  He was last seen in our office by [facility] 1 year January 17, 2022 for preoperative assessment before lumbar back surgery. He's here alone today and is really pleased with the result from back surgery as his pain has resolved. He is working with PT to regain strength and gait stability.  From a cardiac perspective, he is doing fine.  He denies chest pain, chest pressure, orthopnea, or PND.  He has no heart palpitations.  He does experience exertional dyspnea but feels that he is just out of shape after being very inactive for the past few years and also gaining some weight.  Past Medical History:  Diagnosis Date   Allergy    seasonal   Anxiety    Arthritis    Asthma    Cataract    Coronary artery disease 12/08/2016   STEMI, DES Circumflex   GERD (gastroesophageal reflux disease)    Hyperlipidemia    Hypothyroidism    STEMI (ST elevation myocardial infarction) ([place]) 12/08/2016    Past Surgical History:  Procedure Laterality Date   cataract Bilateral 03/2016   COLONOSCOPY  05/31/2004   COLONOSCOPY  06/30/2018   epidural steroid shot     x 3 , lumbar spine area   EYE SURGERY      bilateral cataracts   LEFT HEART CATH AND CORONARY ANGIOGRAPHY N/A 12/08/2016   Procedure: Left Heart Cath and Coronary Angiography;  Surgeon: Sherren Mocha, MD;  Location: [place] [facility];  Service: Cardiovascular;  Laterality: N/A;   LUMBAR LAMINECTOMY/DECOMPRESSION MICRODISCECTOMY Bilateral 01/21/2022   Procedure: Lumbar Two-Three, Lumbar Three-Four/Lumbar Four-Five LAMINOTOMY/FORAMINOTOMY;  Surgeon: Newman Pies, MD;  Location: [facility];  Service: Neurosurgery;  Laterality: Bilateral;  3C   POLYPECTOMY     SHOULDER SURGERY Right    Rotator Cuff Tear Repair   TONSILLECTOMY     UPPER GASTROINTESTINAL ENDOSCOPY      Current Medications: Current Meds  Medication Sig   albuterol (VENTOLIN HFA) 108 (90 Base) MCG/ACT inhaler Inhale 2 puffs into the lungs every 4 (four) hours as needed for wheezing.   aspirin EC 81 MG EC tablet Take 1 tablet (81 mg total) by mouth daily.   bisacodyl (DULCOLAX) 5 MG EC tablet Take 5 mg by mouth daily as needed for moderate constipation.   Coenzyme Q10 200 MG capsule Take 200 mg by mouth daily.   cyclobenzaprine (FLEXERIL) 5 MG tablet Take 1 tablet (5 mg total) by mouth 3 (three) times daily as needed for muscle spasms.   fluorouracil (EFUDEX) 5 % cream Apply 1 application. topically daily as needed (skin cancer).   pantoprazole (PROTONIX)  20 MG tablet TAKE 1 TABLET BY MOUTH EVERY DAY (Patient taking differently: Take 20 mg by mouth daily as needed for heartburn or indigestion.)   polyethylene glycol powder (GLYCOLAX/MIRALAX) powder Take 17 g by mouth as needed for mild constipation.    SYNTHROID 88 MCG tablet TAKE 1 TABLET BY MOUTH EVERY DAY (Patient taking differently: Take 88 mcg by mouth daily before breakfast.)   tamsulosin (FLOMAX) 0.4 MG CAPS capsule TAKE 2 CAPSULES BY MOUTH EVERY DAY (Patient taking differently: Take 0.4 mg by mouth daily.)   [DISCONTINUED] atorvastatin (LIPITOR) 80 MG tablet Take 1 tablet (80 mg total) by mouth daily.    [DISCONTINUED] nitroGLYCERIN (NITROSTAT) 0.4 MG SL tablet Place 1 tablet (0.4 mg total) under the tongue every 5 (five) minutes as needed for chest pain.     Allergies:   Contrast media [iodinated contrast media], Erythromycin, Niacin and related, Penicillins, and Gabapentin   Social History   Socioeconomic History   Marital status: Married    Spouse name: Not on file   Number of children: 2   Years of education: Not on file   Highest education level: Not on file  Occupational History   Occupation: retired  Tobacco Use   Smoking status: Never   Smokeless tobacco: Never  Vaping Use   Vaping Use: Never used  Substance and Sexual Activity   Alcohol use: Yes    Alcohol/week: 3.0 standard drinks of alcohol    Types: 3 Glasses of wine per week    Comment: occasional   Drug use: No   Sexual activity: Not on file  Other Topics Concern   Not on file  Social History Narrative   Not on file   Social Determinants of Health   Financial Resource Strain: Low Risk  (07/06/2021)   Overall Financial Resource Strain (CARDIA)    Difficulty of Paying Living Expenses: Not hard at all  Food Insecurity: No Food Insecurity (07/06/2021)   Hunger Vital Sign    Worried About Running Out of Food in the Last Year: Never true    Ran Out of Food in the Last Year: Never true  Transportation Needs: No Transportation Needs (07/06/2021)   PRAPARE - Hydrologist (Medical): No    Lack of Transportation (Non-Medical): No  Physical Activity: Inactive (07/06/2021)   Exercise Vital Sign    Days of Exercise per Week: 0 days    Minutes of Exercise per Session: 0 min  Stress: No Stress Concern Present (07/06/2021)   [place]    Feeling of Stress : Not at all  Social Connections: Moderately Integrated (07/06/2021)   Social Connection and Isolation Panel [NHANES]    Frequency of Communication with Friends and Family:  More than three times a week    Frequency of Social Gatherings with Friends and Family: More than three times a week    Attends Religious Services: More than 4 times per year    Active Member of [employer] or Organizations: No    Attends Archivist Meetings: Never    Marital Status: Married     Family History: The patient's family history includes Diabetes in his mother; Diverticulosis in his mother and sister; Heart disease in his sister; Heart failure in his mother; Irritable bowel syndrome in his mother and sister; Lung cancer in his father. There is no history of Colon cancer, Esophageal cancer, Pancreatic cancer, or Stomach cancer.  ROS:   Please see  the history of present illness.    He complains of midline abdominal hernia without pain. All other systems reviewed and are negative.  EKGs/Labs/Other Studies Reviewed:    EKG:  EKG is ordered today.  The ekg ordered today demonstrates sinus bradycardia 56 bpm, within normal limits.  Recent Labs: 09/04/2021: TSH 1.84 01/27/2022: ALT 34; BUN 13; Creatinine, Ser 0.88; Hemoglobin 13.5; Platelets 302; Potassium 3.9; Sodium 140  Recent Lipid Panel    Component Value Date/Time   CHOL 115 09/04/2021 1000   CHOL 89 (L) 01/21/2017 0904   TRIG 62.0 09/04/2021 1000   HDL 38.00 (L) 09/04/2021 1000   HDL 31 (L) 01/21/2017 0904   CHOLHDL 3 09/04/2021 1000   VLDL 12.4 09/04/2021 1000   LDLCALC 64 09/04/2021 1000   LDLCALC 62 03/28/2020 1055   LDLDIRECT 144.0 05/13/2013 1036     Risk Assessment/Calculations:           Physical Exam:    VS:  BP 130/82   Pulse (!) 56   Ht 6' (1.829 m)   Wt 220 lb 12.8 oz (100.2 kg)   SpO2 96%   BMI 29.95 kg/m     Wt Readings from Last 3 Encounters:  03/13/22 220 lb 12.8 oz (100.2 kg)  01/27/22 218 lb (98.9 kg)  01/21/22 216 lb (98 kg)     GEN:  Well nourished, well developed in no acute distress HEENT: Normal NECK: No JVD; No carotid bruits LYMPHATICS: No lymphadenopathy CARDIAC:  RRR, no murmurs, rubs, gallops RESPIRATORY:  Clear to auscultation without rales, wheezing or rhonchi  ABDOMEN: Soft, non-tender, non-distended MUSCULOSKELETAL: Trace bilateral ankle edema; No deformity.  Pedal pulses 2+ and equal bilaterally SKIN: Warm and dry NEUROLOGIC:  Alert and oriented x 3 PSYCHIATRIC:  Normal affect   ASSESSMENT:    1. Coronary artery disease involving native coronary artery of native heart without angina pectoris   2. Mixed hyperlipidemia   3. Essential hypertension    PLAN:    In order of problems listed above:  The patient is stable without symptoms of angina at present.  He continues on aspirin for antiplatelet therapy and a high intensity statin drug.  We discussed the importance of initiating a regular exercise program now that he is in the recovery phase from his back surgery and he is considering either a walking program or using a stationary bicycle.  He will focus on achieving weight loss through diet and regular exercise. Treated with atorvastatin 80 mg daily.  Lipids earlier this year demonstrate a cholesterol of 115, HDL 38, LDL 64.  He will continue his current medical regimen.  He will work on lifestyle modification. Blood pressure on my recheck today is 130/82.  Continue to follow.      Medication Adjustments/Labs and Tests Ordered: Current medicines are reviewed at length with the patient today.  Concerns regarding medicines are outlined above.  Orders Placed This Encounter  Procedures   EKG 12-Lead   Meds ordered this encounter  Medications   atorvastatin (LIPITOR) 80 MG tablet    Sig: Take 1 tablet (80 mg total) by mouth daily.    Dispense:  90 tablet    Refill:  3   nitroGLYCERIN (NITROSTAT) 0.4 MG SL tablet    Sig: Place 1 tablet (0.4 mg total) under the tongue every 5 (five) minutes as needed for chest pain.    Dispense:  25 tablet    Refill:  5    Patient Instructions  Medication Instructions:  Your  physician recommends that  you continue on your current medications as directed. Please refer to the Current Medication list given to you today.  *If you need a refill on your cardiac medications before your next appointment, please call your pharmacy*   Lab Work: NONE If you have labs (blood work) drawn today and your tests are completely normal, you will receive your results only by: [place] (if you have MyChart) OR A paper copy in the mail If you have any lab test that is abnormal or we need to change your treatment, we will call you to review the results.   Testing/Procedures: NONE   Follow-Up: At [facility], you and your health needs are our priority.  As part of our continuing mission to provide you with exceptional heart care, we have created designated Provider Care Teams.  These Care Teams include your primary Cardiologist (physician) and Advanced Practice Providers (APPs -  Physician Assistants and Nurse Practitioners) who all work together to provide you with the care you need, when you need it.  Your next appointment:   1 year(s)  The format for your next appointment:   In Person  Provider:   Sherren Mocha, MD {    Important Information About Sugar         Signed, Sherren Mocha, MD  03/13/2022 11:57 AM    [place]

## 2022-03-13 NOTE — Patient Instructions (Signed)
Medication Instructions:  Your physician recommends that you continue on your current medications as directed. Please refer to the Current Medication list given to you today.  *If you need a refill on your cardiac medications before your next appointment, please call your pharmacy*   Lab Work: NONE If you have labs (blood work) drawn today and your tests are completely normal, you will receive your results only by: MyChart Message (if you have MyChart) OR A paper copy in the mail If you have any lab test that is abnormal or we need to change your treatment, we will call you to review the results.   Testing/Procedures: NONE   Follow-Up: At [facility], you and your health needs are our priority.  As part of our continuing mission to provide you with exceptional heart care, we have created designated Provider Care Teams.  These Care Teams include your primary Cardiologist (physician) and Advanced Practice Providers (APPs -  Physician Assistants and Nurse Practitioners) who all work together to provide you with the care you need, when you need it.  Your next appointment:   1 year(s)  The format for your next appointment:   In Person  Provider:   Michael Cooper, MD       Important Information About Sugar       

## 2022-03-20 ENCOUNTER — Other Ambulatory Visit: Payer: Self-pay | Admitting: Family Medicine

## 2022-04-29 ENCOUNTER — Encounter: Payer: Self-pay | Admitting: *Deleted

## 2022-05-25 ENCOUNTER — Other Ambulatory Visit: Payer: Self-pay | Admitting: Family Medicine

## 2022-05-25 DIAGNOSIS — E039 Hypothyroidism, unspecified: Secondary | ICD-10-CM

## 2022-05-25 DIAGNOSIS — J302 Other seasonal allergic rhinitis: Secondary | ICD-10-CM

## 2022-05-25 DIAGNOSIS — L820 Inflamed seborrheic keratosis: Secondary | ICD-10-CM

## 2022-05-25 DIAGNOSIS — E032 Hypothyroidism due to medicaments and other exogenous substances: Secondary | ICD-10-CM

## 2022-05-25 DIAGNOSIS — E038 Other specified hypothyroidism: Secondary | ICD-10-CM

## 2022-07-15 ENCOUNTER — Ambulatory Visit (INDEPENDENT_AMBULATORY_CARE_PROVIDER_SITE_OTHER): Payer: Medicare Other

## 2022-07-15 VITALS — Wt 220.0 lb

## 2022-07-15 DIAGNOSIS — Z0000 Encounter for general adult medical examination without abnormal findings: Secondary | ICD-10-CM

## 2022-07-15 NOTE — Progress Notes (Addendum)
I connected with  Ryan [name] on 07/15/22 by a audio enabled telemedicine application and verified that I am speaking with the correct person using two identifiers.  Patient Location: Home  Provider Location: Office/Clinic  I discussed the limitations of evaluation and management by telemedicine. The patient expressed understanding and agreed to proceed.   Subjective:   Ryan [name] is a 76 y.o. male who presents for Medicare Annual/Subsequent preventive examination.  Review of Systems     Cardiac Risk Factors include: advanced age (>63mn, >>60women);male gender;dyslipidemia     Objective:    Today's Vitals   07/15/22 0922  Weight: 220 lb (99.8 kg)   Body mass index is 29.84 kg/m.     07/15/2022    9:25 AM 01/14/2022   10:16 AM 07/06/2021    9:42 AM 03/05/2017   10:39 AM 01/16/2017    3:49 PM 12/16/2016    3:48 PM 12/08/2016   11:22 PM  Advanced Directives  Does Patient Have a Medical Advance Directive? Yes Yes Yes Yes Yes No No  Type of [place] will [place] will Healthcare Power of [place] will     Does patient want to make changes to medical advance directive? No - Patient declined   No - Patient declined No - Patient declined    Copy of HF[place]in Chart? Yes - validated most recent copy scanned in chart (See row information)  Yes - validated most recent copy scanned in chart (See row information) No - copy requested     Would patient like information on creating a medical advance directive?      No - Patient declined No - Patient declined    Current Medications (verified) Outpatient Encounter Medications as of 07/15/2022  Medication Sig   albuterol (VENTOLIN HFA) 108 (90 Base) MCG/ACT inhaler Inhale 2 puffs into the lungs every 4 (four) hours as needed for wheezing.   aspirin EC 81 MG EC tablet Take 1 tablet (81 mg total) by mouth  daily.   atorvastatin (LIPITOR) 80 MG tablet Take 1 tablet (80 mg total) by mouth daily.   bisacodyl (DULCOLAX) 5 MG EC tablet Take 5 mg by mouth daily as needed for moderate constipation.   Coenzyme Q10 200 MG capsule Take 200 mg by mouth daily.   cyclobenzaprine (FLEXERIL) 5 MG tablet Take 1 tablet (5 mg total) by mouth 3 (three) times daily as needed for muscle spasms.   fluorouracil (EFUDEX) 5 % cream Apply 1 application. topically daily as needed (skin cancer).   nitroGLYCERIN (NITROSTAT) 0.4 MG SL tablet Place 1 tablet (0.4 mg total) under the tongue every 5 (five) minutes as needed for chest pain.   pantoprazole (PROTONIX) 20 MG tablet TAKE 1 TABLET BY MOUTH EVERY DAY   polyethylene glycol powder (GLYCOLAX/MIRALAX) powder Take 17 g by mouth as needed for mild constipation.    SYNTHROID 88 MCG tablet TAKE 1 TABLET BY MOUTH EVERY DAY   tamsulosin (FLOMAX) 0.4 MG CAPS capsule TAKE 2 CAPSULES BY MOUTH EVERY DAY (Patient taking differently: Take 0.4 mg by mouth daily.)   No facility-administered encounter medications on file as of 07/15/2022.    Allergies (verified) Contrast media [iodinated contrast media], Erythromycin, Niacin and related, Penicillins, and Gabapentin   History: Past Medical History:  Diagnosis Date   Allergy    seasonal   Anxiety    Arthritis    Asthma    Cataract  Coronary artery disease 12/08/2016   STEMI, DES Circumflex   GERD (gastroesophageal reflux disease)    Hyperlipidemia    Hypothyroidism    STEMI (ST elevation myocardial infarction) ([place]) 12/08/2016   Past Surgical History:  Procedure Laterality Date   cataract Bilateral 03/2016   COLONOSCOPY  05/31/2004   COLONOSCOPY  06/30/2018   epidural steroid shot     x 3 , lumbar spine area   EYE SURGERY     bilateral cataracts   LEFT HEART CATH AND CORONARY ANGIOGRAPHY N/A 12/08/2016   Procedure: Left Heart Cath and Coronary Angiography;  Surgeon: Sherren Mocha, MD;  Location: [facility];   Service: Cardiovascular;  Laterality: N/A;   LUMBAR LAMINECTOMY/DECOMPRESSION MICRODISCECTOMY Bilateral 01/21/2022   Procedure: Lumbar Two-Three, Lumbar Three-Four/Lumbar Four-Five LAMINOTOMY/FORAMINOTOMY;  Surgeon: Newman Pies, MD;  Location: [facility];  Service: Neurosurgery;  Laterality: Bilateral;  3C   POLYPECTOMY     SHOULDER SURGERY Right    Rotator Cuff Tear Repair   TONSILLECTOMY     UPPER GASTROINTESTINAL ENDOSCOPY     Family History  Problem Relation Age of Onset   Diabetes Mother    Heart failure Mother    Irritable bowel syndrome Mother    Diverticulosis Mother    Lung cancer Father        worked in Product/process development scientist yard   Heart disease Sister    Irritable bowel syndrome Sister    Diverticulosis Sister    Colon cancer Neg Hx    Esophageal cancer Neg Hx    Pancreatic cancer Neg Hx    Stomach cancer Neg Hx    Social History   Socioeconomic History   Marital status: Married    Spouse name: Not on file   Number of children: 2   Years of education: Not on file   Highest education level: Not on file  Occupational History   Occupation: retired  Tobacco Use   Smoking status: Never   Smokeless tobacco: Never  Vaping Use   Vaping Use: Never used  Substance and Sexual Activity   Alcohol use: Yes    Alcohol/week: 3.0 standard drinks of alcohol    Types: 3 Glasses of wine per week    Comment: occasional   Drug use: No   Sexual activity: Not on file  Other Topics Concern   Not on file  Social History Narrative   Not on file   Social Determinants of Health   Financial Resource Strain: Low Risk  (07/15/2022)   Overall Financial Resource Strain (CARDIA)    Difficulty of Paying Living Expenses: Not hard at all  Food Insecurity: No Food Insecurity (07/15/2022)   Hunger Vital Sign    Worried About Running Out of Food in the Last Year: Never true    Ran Out of Food in the Last Year: Never true  Transportation Needs: No Transportation Needs (07/15/2022)   PRAPARE -  Hydrologist (Medical): No    Lack of Transportation (Non-Medical): No  Physical Activity: Inactive (07/15/2022)   Exercise Vital Sign    Days of Exercise per Week: 0 days    Minutes of Exercise per Session: 0 min  Stress: No Stress Concern Present (07/15/2022)   [place]    Feeling of Stress : Not at all  Social Connections: Moderately Integrated (07/15/2022)   Social Connection and Isolation Panel [NHANES]    Frequency of Communication with Friends and Family: More than  three times a week    Frequency of Social Gatherings with Friends and Family: More than three times a week    Attends Religious Services: More than 4 times per year    Active Member of [employer] or Organizations: No    Attends Music therapist: Never    Marital Status: Married    Tobacco Counseling Counseling given: Not Answered   Clinical Intake:  Pre-visit preparation completed: Yes  Pain : No/denies pain     BMI - recorded: 29.84 Nutritional Status: BMI 25 -29 Overweight Nutritional Risks: None Diabetes: No  How often do you need to have someone help you when you read instructions, pamphlets, or other written materials from your doctor or pharmacy?: 1 - Never  Diabetic?no  Interpreter Needed?: No  Information entered by :: Charlott Rakes, LPN   Activities of Daily Living    07/15/2022    9:26 AM 01/14/2022   10:18 AM  In your present state of health, do you have any difficulty performing the following activities:  Hearing? 0   Vision? 0   Difficulty concentrating or making decisions? 0   Walking or climbing stairs? 0   Dressing or bathing? 0   Doing errands, shopping? 0 0  Preparing Food and eating ? N   Using the Toilet? N   In the past six months, have you accidently leaked urine? N   Do you have problems with loss of bowel control? N   Managing your Medications? N   Managing  your Finances? N   Housekeeping or managing your Housekeeping? N     Patient Care Team: Vivi Barrack, MD as PCP - General (Family Medicine) Sherren Mocha, MD as PCP - Cardiology (Cardiology) Edythe Clarity, [facility] as Pharmacist (Pharmacist)  Indicate any recent Medical Services you may have received from other than [facility] providers in the past year (date may be approximate).     Assessment:   This is a routine wellness examination for [place].  Hearing/Vision screen Hearing Screening - Comments:: Pt denies any hearing issues  Vision Screening - Comments:: Pt follows up with Dr Katy Fitch for annul eye exams   Dietary issues and exercise activities discussed: Current Exercise Habits: The patient does not participate in regular exercise at present   Goals Addressed             This Visit's Progress    Patient Stated       Weight loss        Depression Screen    07/15/2022    9:25 AM 09/04/2021    9:05 AM 07/06/2021    9:41 AM 09/20/2019    8:18 AM 07/13/2018   10:56 AM 05/05/2017   10:43 AM 01/20/2017   10:11 AM  PHQ 2/9 Scores  PHQ - 2 Score 0 0 0 0 0 0 0    Fall Risk    07/15/2022    9:26 AM 09/04/2021    9:05 AM 07/06/2021    9:45 AM 09/20/2019    8:18 AM 01/16/2017    2:17 PM  Fall Risk   Falls in the past year? 1 0 0 0 No  Number falls in past yr: 1 1 0    Injury with Fall? 0 0 0    Risk for fall due to : Impaired vision  Impaired vision;Impaired mobility    Follow up Falls prevention discussed  Falls prevention discussed      FALL RISK PREVENTION PERTAINING TO THE HOME:  Any stairs in or around the home? Yes  If so, are there any without handrails? No  Home free of loose throw rugs in walkways, pet beds, electrical cords, etc? Yes  Adequate lighting in your home to reduce risk of falls? Yes   ASSISTIVE DEVICES UTILIZED TO PREVENT FALLS:  Life alert? No  Use of a cane, walker or w/c? No  Grab bars in the bathroom? Yes  Shower chair or bench in shower? Yes   Elevated toilet seat or a handicapped toilet? Yes   TIMED UP AND GO:  Was the test performed? No .  Cognitive Function:        07/15/2022    9:27 AM 07/06/2021    9:48 AM  6CIT Screen  What Year? 0 points 0 points  What month? 0 points 0 points  What time? 0 points 0 points  Count back from 20 0 points 0 points  Months in reverse 4 points 0 points  Repeat phrase 2 points 4 points  Total Score 6 points 4 points    Immunizations Immunization History  Administered Date(s) Administered   Influenza Split 04/29/2012   Influenza Whole 05/05/2009   Influenza, High Dose Seasonal PF 05/13/2013, 04/19/2019   Influenza-Unspecified 05/03/2014, 05/26/2015, 04/16/2016, 06/01/2022   PFIZER(Purple Top)SARS-COV-2 Vaccination 08/27/2019, 09/17/2019   Pneumococcal Conjugate-13 06/05/2015   Pneumococcal Polysaccharide-23 01/24/2010, 08/28/2016   Td 08/05/2005   Tdap 09/03/2017   Zoster Recombinat (Shingrix) 03/22/2017, 07/22/2017   Zoster, Live 05/28/2013    TDAP status: Up to date  Flu Vaccine status: Up to date  Pneumococcal vaccine status: Up to date  Covid-19 vaccine status: Completed vaccines  Qualifies for Shingles Vaccine? Yes   Zostavax completed Yes   Shingrix Completed?: Yes  Screening Tests Health Maintenance  Topic Date Due   COVID-19 Vaccine (3 - Pfizer risk series) 10/15/2019   Medicare Annual Wellness (AWV)  07/16/2023   DTaP/Tdap/Td (3 - Td or Tdap) 09/04/2027   Pneumonia Vaccine 79+ Years old  Completed   INFLUENZA VACCINE  Completed   Hepatitis C Screening  Completed   Zoster Vaccines- Shingrix  Completed   HPV VACCINES  Aged Out   COLONOSCOPY (Pts 45-71yr Insurance coverage will need to be confirmed)  Discontinued    Health Maintenance  Health Maintenance Due  Topic Date Due   COVID-19 Vaccine (3 - Pfizer risk series) 10/15/2019    Colorectal cancer screening: Type of screening: Colonoscopy. Completed 08/25/19. Repeat every as directed   years   Additional Screening:  Hepatitis C Screening:  Completed 09/20/19  Vision Screening: Recommended annual ophthalmology exams for early detection of glaucoma and other disorders of the eye. Is the patient up to date with their annual eye exam?  Yes  Who is the provider or what is the name of the office in which the patient attends annual eye exams? Dr GKaty Fitch If pt is not established with a provider, would they like to be referred to a provider to establish care? No .   Dental Screening: Recommended annual dental exams for proper oral hygiene  Community Resource Referral / Chronic Care Management: CRR required this visit?  No   CCM required this visit?  No      Plan:     I have personally reviewed and noted the following in the patient's chart:   Medical and social history Use of alcohol, tobacco or illicit drugs  Current medications and supplements including opioid prescriptions. Patient is not currently taking opioid prescriptions. Functional ability  and status Nutritional status Physical activity Advanced directives List of other physicians Hospitalizations, surgeries, and ER visits in previous 12 months Vitals Screenings to include cognitive, depression, and falls Referrals and appointments  In addition, I have reviewed and discussed with patient certain preventive protocols, quality metrics, and best practice recommendations. A written personalized care plan for preventive services as well as general preventive health recommendations were provided to patient.     Willette Brace, LPN   [id number]   Nurse Notes: none

## 2022-07-15 NOTE — Patient Instructions (Signed)
[name] , Thank you for taking time to come for your Medicare Wellness Visit. I appreciate your ongoing commitment to your health goals. Please review the following plan we discussed and let me know if I can assist you in the future.   These are the goals we discussed:  Goals      Keep Low Back Pain Under Control     Timeframe:  Long-Range Goal Priority:  High Start Date: 02/26/21                            Expected End Date: 08/29/21                      Follow Up Date 06/04/21    - call for medicine refill 2 or 3 days before it runs out - develop a personal pain management plan - plan exercise or activity when pain is best controlled - stay active - track times pain is worst and when it is best - track what makes the pain worse and what makes it better    Why is this important?   Day-to-day life can be hard when you have back pain.  Pain medicine is just one piece of the treatment puzzle. There are many things you can do to manage pain and keep your back strong.   Lifestyle changes, like stopping smoking and eating foods with Vitamin D and calcium, keep your bones and muscles healthy. Your back is better when it is supported by strong muscles.  You can try these action steps to help you manage your pain.     Notes:      Patient Stated     Get back to exercise      Patient Stated     Weight loss         This is a list of the screening recommended for you and due dates:  Health Maintenance  Topic Date Due   COVID-19 Vaccine (3 - Pfizer risk series) 10/15/2019   Medicare Annual Wellness Visit  07/16/2023   DTaP/Tdap/Td vaccine (3 - Td or Tdap) 09/04/2027   Pneumonia Vaccine  Completed   Flu Shot  Completed   Hepatitis C Screening: USPSTF Recommendation to screen - Ages 18-79 yo.  Completed   Zoster (Shingles) Vaccine  Completed   HPV Vaccine  Aged Out   Colon Cancer Screening  Discontinued    Advanced directives: copies in chart   Conditions/risks identified:  weight loss   Next appointment: Follow up in one year for your annual wellness visit.   Preventive Care 80 Years and Older, Male  Preventive care refers to lifestyle choices and visits with your health care provider that can promote health and wellness. What does preventive care include? A yearly physical exam. This is also called an annual well check. Dental exams once or twice a year. Routine eye exams. Ask your health care provider how often you should have your eyes checked. Personal lifestyle choices, including: Daily care of your teeth and gums. Regular physical activity. Eating a healthy diet. Avoiding tobacco and drug use. Limiting alcohol use. Practicing safe sex. Taking low doses of aspirin every day. Taking vitamin and mineral supplements as recommended by your health care provider. What happens during an annual well check? The services and screenings done by your health care provider during your annual well check will depend on your age, overall health, lifestyle risk factors, and family  history of disease. Counseling  Your health care provider may ask you questions about your: Alcohol use. Tobacco use. Drug use. Emotional well-being. Home and relationship well-being. Sexual activity. Eating habits. History of falls. Memory and ability to understand (cognition). Work and work Statistician. Screening  You may have the following tests or measurements: Height, weight, and BMI. Blood pressure. Lipid and cholesterol levels. These may be checked every 5 years, or more frequently if you are over 62 years old. Skin check. Lung cancer screening. You may have this screening every year starting at age 100 if you have a 30-pack-year history of smoking and currently smoke or have quit within the past 15 years. Fecal occult blood test (FOBT) of the stool. You may have this test every year starting at age 45. Flexible sigmoidoscopy or colonoscopy. You may have a sigmoidoscopy  every 5 years or a colonoscopy every 10 years starting at age 66. Prostate cancer screening. Recommendations will vary depending on your family history and other risks. Hepatitis C blood test. Hepatitis B blood test. Sexually transmitted disease (STD) testing. Diabetes screening. This is done by checking your blood sugar (glucose) after you have not eaten for a while (fasting). You may have this done every 1-3 years. Abdominal aortic aneurysm (AAA) screening. You may need this if you are a current or former smoker. Osteoporosis. You may be screened starting at age 72 if you are at high risk. Talk with your health care provider about your test results, treatment options, and if necessary, the need for more tests. Vaccines  Your health care provider may recommend certain vaccines, such as: Influenza vaccine. This is recommended every year. Tetanus, diphtheria, and acellular pertussis (Tdap, Td) vaccine. You may need a Td booster every 10 years. Zoster vaccine. You may need this after age 23. Pneumococcal 13-valent conjugate (PCV13) vaccine. One dose is recommended after age 39. Pneumococcal polysaccharide (PPSV23) vaccine. One dose is recommended after age 82. Talk to your health care provider about which screenings and vaccines you need and how often you need them. This information is not intended to replace advice given to you by your health care provider. Make sure you discuss any questions you have with your health care provider. Document Released: 08/18/2015 Document Revised: 04/10/2016 Document Reviewed: 05/23/2015 Elsevier Interactive Patient Education  2017 [place] Prevention in the Home Falls can cause injuries. They can happen to people of all ages. There are many things you can do to make your home safe and to help prevent falls. What can I do on the outside of my home? Regularly fix the edges of walkways and driveways and fix any cracks. Remove anything that might make  you trip as you walk through a door, such as a raised step or threshold. Trim any bushes or trees on the path to your home. Use bright outdoor lighting. Clear any walking paths of anything that might make someone trip, such as rocks or tools. Regularly check to see if handrails are loose or broken. Make sure that both sides of any steps have handrails. Any raised decks and porches should have guardrails on the edges. Have any leaves, snow, or ice cleared regularly. Use sand or salt on walking paths during winter. Clean up any spills in your garage right away. This includes oil or grease spills. What can I do in the bathroom? Use night lights. Install grab bars by the toilet and in the tub and shower. Do not use towel bars as grab bars.  Use non-skid mats or decals in the tub or shower. If you need to sit down in the shower, use a plastic, non-slip stool. Keep the floor dry. Clean up any water that spills on the floor as soon as it happens. Remove soap buildup in the tub or shower regularly. Attach bath mats securely with double-sided non-slip rug tape. Do not have throw rugs and other things on the floor that can make you trip. What can I do in the bedroom? Use night lights. Make sure that you have a light by your bed that is easy to reach. Do not use any sheets or blankets that are too big for your bed. They should not hang down onto the floor. Have a firm chair that has side arms. You can use this for support while you get dressed. Do not have throw rugs and other things on the floor that can make you trip. What can I do in the kitchen? Clean up any spills right away. Avoid walking on wet floors. Keep items that you use a lot in easy-to-reach places. If you need to reach something above you, use a strong step stool that has a grab bar. Keep electrical cords out of the way. Do not use floor polish or wax that makes floors slippery. If you must use wax, use non-skid floor wax. Do not  have throw rugs and other things on the floor that can make you trip. What can I do with my stairs? Do not leave any items on the stairs. Make sure that there are handrails on both sides of the stairs and use them. Fix handrails that are broken or loose. Make sure that handrails are as long as the stairways. Check any carpeting to make sure that it is firmly attached to the stairs. Fix any carpet that is loose or worn. Avoid having throw rugs at the top or bottom of the stairs. If you do have throw rugs, attach them to the floor with carpet tape. Make sure that you have a light switch at the top of the stairs and the bottom of the stairs. If you do not have them, ask someone to add them for you. What else can I do to help prevent falls? Wear shoes that: Do not have high heels. Have rubber bottoms. Are comfortable and fit you well. Are closed at the toe. Do not wear sandals. If you use a stepladder: Make sure that it is fully opened. Do not climb a closed stepladder. Make sure that both sides of the stepladder are locked into place. Ask someone to hold it for you, if possible. Clearly mark and make sure that you can see: Any grab bars or handrails. First and last steps. Where the edge of each step is. Use tools that help you move around (mobility aids) if they are needed. These include: Canes. Walkers. Scooters. Crutches. Turn on the lights when you go into a dark area. Replace any light bulbs as soon as they burn out. Set up your furniture so you have a clear path. Avoid moving your furniture around. If any of your floors are uneven, fix them. If there are any pets around you, be aware of where they are. Review your medicines with your doctor. Some medicines can make you feel dizzy. This can increase your chance of falling. Ask your doctor what other things that you can do to help prevent falls. This information is not intended to replace advice given to you by your health care  provider. Make sure you discuss any questions you have with your health care provider. Document Released: 05/18/2009 Document Revised: 12/28/2015 Document Reviewed: 08/26/2014 Elsevier Interactive Patient Education  2017 [employer].

## 2022-07-19 ENCOUNTER — Ambulatory Visit: Payer: Medicare Other

## 2022-08-15 ENCOUNTER — Telehealth: Payer: Self-pay | Admitting: Family Medicine

## 2022-08-15 NOTE — Telephone Encounter (Signed)
Patient need OV with PCP for discussion on medication

## 2022-08-15 NOTE — Telephone Encounter (Signed)
Patient called to speak with either PCP or Beverly Milch about different anxiety medications he could trial. Patient also wanted to discuss possible medication interactions. Pt requests a callback.

## 2022-08-16 ENCOUNTER — Encounter: Payer: Self-pay | Admitting: Family

## 2022-08-16 ENCOUNTER — Telehealth: Payer: Self-pay | Admitting: Family Medicine

## 2022-08-16 ENCOUNTER — Ambulatory Visit (INDEPENDENT_AMBULATORY_CARE_PROVIDER_SITE_OTHER): Payer: Medicare Other | Admitting: Family

## 2022-08-16 VITALS — BP 133/86 | HR 52 | Temp 97.6°F | Ht 72.0 in | Wt 221.2 lb

## 2022-08-16 DIAGNOSIS — H00012 Hordeolum externum right lower eyelid: Secondary | ICD-10-CM | POA: Diagnosis not present

## 2022-08-16 DIAGNOSIS — F418 Other specified anxiety disorders: Secondary | ICD-10-CM | POA: Diagnosis not present

## 2022-08-16 MED ORDER — ALPRAZOLAM 0.25 MG PO TABS: 0.2500 mg | ORAL_TABLET | Freq: Two times a day (BID) | ORAL | 0 refills | Status: DC | PRN

## 2022-08-16 MED ORDER — ESCITALOPRAM OXALATE 5 MG PO TABS: 5.0000 mg | ORAL_TABLET | Freq: Every day | ORAL | 2 refills | Status: DC

## 2022-08-16 NOTE — Telephone Encounter (Signed)
Patient states he would like to transfer care from Dr. Dimas Chyle to [facility]. Is this okay with you?

## 2022-08-16 NOTE — Progress Notes (Unsigned)
Patient ID: Ryan [name], male    DOB: 1945-08-25, 78 y.o.   MRN: [id number]  Chief Complaint  Patient presents with   Anxiety    Pt c/o Anxiety, Pt states he takes care of his 4 year sister, she was diagnosed with stage 4 lung cancer and would like to discuss Anxiety.    Stye    Pt c/o stye on right eye, Has tried hot compresses and cream which did help but moved. Pt was put on antibiotics which did not help.    HPI:      Anxiety:  Pt c/o Anxiety, Pt states he takes care of his 79 year sister, she was diagnosed with stage 4 lung cancer and he would like to discuss  Anxiety. He feels like he can't keep his emotions under control & needs to be strong for her.  States he had a RX from 2008 of Alprazalam (brought bottle in with him) which helped him in the past when he was flying d/t claustrophobia, but he was afraid to take any d/t being expired. Reports trying different meds at that time that didn't help, but thinks Lexapro possibly did.      Stye:  pt has had for about a month, has applied warm compresses and OTC stye cream. Seen by his DERM for a basal cell removal and he prescribed him DOXY for the stye and he says he has another week to take. He states when he has tears it seems the stye enlarges. Denies any pain or itching or inside eye, no vision changes.  Assessment & Plan:  1. Situational anxiety - he and wife are main cg for his sister and he has been breaking down in tears due to her condition and very anxious that they are going to get terminal news next week as they cancelled her palliative radiation tx.  Sending Alprazolam and advised to take only prn until the Lexapro kicks in - advised that recommended tx is for 3 mos on the Lexapro. He can let me know in 2-3 weeks how he is doing and then schedule a 1 month f/u with his physical.  - escitalopram (LEXAPRO) 5 MG tablet; Take 1 tablet (5 mg total) by mouth daily.  Dispense: 30 tablet; Refill: 2 - ALPRAZolam (XANAX) 0.25 MG  tablet; Take 1 tablet (0.25 mg total) by mouth 2 (two) times daily as needed for anxiety.  Dispense: 20 tablet; Refill: 0  2. Hordeolum externum of right lower eyelid appears just about healed. advised to continue using the warm compress for 10-15 min tid, then with a clean pinky, gently massage the style to help loosen and absorb. Stop the DOXY. If any worsening in sx, call back.  Subjective:    Outpatient Medications Prior to Visit  Medication Sig Dispense Refill   albuterol (VENTOLIN HFA) 108 (90 Base) MCG/ACT inhaler Inhale 2 puffs into the lungs every 4 (four) hours as needed for wheezing. 1 each 2   aspirin EC 81 MG EC tablet Take 1 tablet (81 mg total) by mouth daily. 30 tablet 11   atorvastatin (LIPITOR) 80 MG tablet Take 1 tablet (80 mg total) by mouth daily. 90 tablet 3   bisacodyl (DULCOLAX) 5 MG EC tablet Take 5 mg by mouth daily as needed for moderate constipation.     Coenzyme Q10 200 MG capsule Take 200 mg by mouth daily.     cyclobenzaprine (FLEXERIL) 5 MG tablet Take 1 tablet (5 mg total) by mouth 3 (three)  times daily as needed for muscle spasms. 50 tablet 0   fluorouracil (EFUDEX) 5 % cream Apply 1 application. topically daily as needed (skin cancer).     nitroGLYCERIN (NITROSTAT) 0.4 MG SL tablet Place 1 tablet (0.4 mg total) under the tongue every 5 (five) minutes as needed for chest pain. 25 tablet 5   pantoprazole (PROTONIX) 20 MG tablet TAKE 1 TABLET BY MOUTH EVERY DAY 90 tablet 1   polyethylene glycol powder (GLYCOLAX/MIRALAX) powder Take 17 g by mouth as needed for mild constipation.      SYNTHROID 88 MCG tablet TAKE 1 TABLET BY MOUTH EVERY DAY 90 tablet 1   tamsulosin (FLOMAX) 0.4 MG CAPS capsule TAKE 2 CAPSULES BY MOUTH EVERY DAY (Patient taking differently: Take 0.4 mg by mouth daily.) 180 capsule 0   No facility-administered medications prior to visit.   Past Medical History:  Diagnosis Date   Allergy    seasonal   Anxiety    Arthritis    Asthma     Cataract    Coronary artery disease 12/08/2016   STEMI, DES Circumflex   GERD (gastroesophageal reflux disease)    Hyperlipidemia    Hypothyroidism    STEMI (ST elevation myocardial infarction) ([facility]) 12/08/2016   Past Surgical History:  Procedure Laterality Date   cataract Bilateral 03/2016   COLONOSCOPY  05/31/2004   COLONOSCOPY  06/30/2018   epidural steroid shot     x 3 , lumbar spine area   EYE SURGERY     bilateral cataracts   LEFT HEART CATH AND CORONARY ANGIOGRAPHY N/A 12/08/2016   Procedure: Left Heart Cath and Coronary Angiography;  Surgeon: Sherren Mocha, MD;  Location: [facility];  Service: Cardiovascular;  Laterality: N/A;   LUMBAR LAMINECTOMY/DECOMPRESSION MICRODISCECTOMY Bilateral 01/21/2022   Procedure: Lumbar Two-Three, Lumbar Three-Four/Lumbar Four-Five LAMINOTOMY/FORAMINOTOMY;  Surgeon: Newman Pies, MD;  Location: [facility];  Service: Neurosurgery;  Laterality: Bilateral;  3C   POLYPECTOMY     SHOULDER SURGERY Right    Rotator Cuff Tear Repair   TONSILLECTOMY     UPPER GASTROINTESTINAL ENDOSCOPY     Allergies  Allergen Reactions   Contrast Media [Iodinated Contrast Media] Shortness Of Breath and Swelling    Swelling of the throat    Erythromycin Nausea And Vomiting   Niacin And Related Swelling   Penicillins Hives    Has patient had a PCN reaction causing immediate rash, facial/tongue/throat swelling, SOB or lightheadedness with hypotension: Y Has patient had a PCN reaction causing severe rash involving mucus membranes or skin necrosis: No Has patient had a PCN reaction that required hospitalization: No Has patient had a PCN reaction occurring within the last 10 years: No If all of the above answers are "NO", then may proceed with Cephalosporin use.   Gabapentin Swelling    Severe swelling in legs      Objective:    Physical Exam Vitals and nursing note reviewed.  Constitutional:      General: He is not in acute distress.    Appearance:  Normal appearance.  HENT:     Head: Normocephalic.  Eyes:     General:        Right eye: Hordeolum (lower eyelid, pinpoint pale pinkish in color) present.     Extraocular Movements: Extraocular movements intact.     Conjunctiva/sclera: Conjunctivae normal.  Cardiovascular:     Rate and Rhythm: Normal rate and regular rhythm.  Pulmonary:     Effort: Pulmonary effort is normal.  Breath sounds: Normal breath sounds.  Musculoskeletal:        General: Normal range of motion.     Cervical back: Normal range of motion.  Skin:    General: Skin is warm and dry.  Neurological:     Mental Status: He is alert and oriented to person, place, and time.  Psychiatric:        Mood and Affect: Mood normal.    BP 133/86 (BP Location: Left Arm, Patient Position: Sitting, Cuff Size: Large)   Pulse (!) 52   Temp 97.6 F (36.4 C) (Temporal)   Ht 6' (1.829 m)   Wt 221 lb 4 oz (100.4 kg)   SpO2 97%   BMI 30.01 kg/m  Wt Readings from Last 3 Encounters:  08/16/22 221 lb 4 oz (100.4 kg)  07/15/22 220 lb (99.8 kg)  03/13/22 220 lb 12.8 oz (100.2 kg)       Jeanie Sewer, NP

## 2022-08-16 NOTE — Telephone Encounter (Signed)
Patient seeing stephanie 08/16/22 for anxiety only.

## 2022-08-17 NOTE — Telephone Encounter (Signed)
yes, fine with me, thx

## 2022-08-19 ENCOUNTER — Telehealth: Payer: Self-pay | Admitting: Family Medicine

## 2022-08-19 NOTE — Telephone Encounter (Signed)
I'm confused, he didn't mention this on the phone? Does he have the meds or not? I thought he had them and he was calling to clarify how to take?

## 2022-08-19 NOTE — Telephone Encounter (Signed)
Patient states: -Lexapro and xanax were not at pharmacy following OV with Jeanie Sewer on 08/16/22 - Pharmacy informed him on Friday that they had the medication ready but then said they don't have the rx order   Patient requests: -Someone either send the medication orders again or call the pharmacy to see what is going on. [facility] [place]

## 2022-08-20 NOTE — Telephone Encounter (Signed)
[place] with me.  Algis Greenhouse. [name], MD 08/20/2022 8:05 AM

## 2022-08-21 NOTE — Telephone Encounter (Signed)
See note

## 2022-08-22 ENCOUNTER — Telehealth: Payer: Self-pay | Admitting: Family Medicine

## 2022-08-22 ENCOUNTER — Other Ambulatory Visit: Payer: Self-pay | Admitting: *Deleted

## 2022-08-22 DIAGNOSIS — L820 Inflamed seborrheic keratosis: Secondary | ICD-10-CM

## 2022-08-22 DIAGNOSIS — E032 Hypothyroidism due to medicaments and other exogenous substances: Secondary | ICD-10-CM

## 2022-08-22 DIAGNOSIS — J302 Other seasonal allergic rhinitis: Secondary | ICD-10-CM

## 2022-08-22 DIAGNOSIS — E039 Hypothyroidism, unspecified: Secondary | ICD-10-CM

## 2022-08-22 DIAGNOSIS — E038 Other specified hypothyroidism: Secondary | ICD-10-CM

## 2022-08-22 NOTE — Telephone Encounter (Signed)
I informed patient of TOC approval and was able to schedule TOC w/ [facility] on 09/05/22 @ 9am. Patient would like to know if annual exam could be done at that visit? Please Advise.

## 2022-08-22 NOTE — Telephone Encounter (Signed)
Ok to send refill for 90d, and 0 refills, needs labs done before next refill, thx

## 2022-08-22 NOTE — Telephone Encounter (Signed)
  LAST APPOINTMENT DATE:  09/04/21 [facility] KitchenW/ [facility])  NEXT APPOINTMENT DATE: 09/05/22 (TOC to [facility])  MEDICATION: SYNTHROID 88 MCG tablet   Is the patient out of medication? Will be out next week  PHARMACY:  [id number] [place] [place] [address] [place]Phone: [phone] Fax: [phone]

## 2022-08-23 ENCOUNTER — Other Ambulatory Visit: Payer: Self-pay

## 2022-08-23 DIAGNOSIS — E038 Other specified hypothyroidism: Secondary | ICD-10-CM

## 2022-08-23 DIAGNOSIS — E039 Hypothyroidism, unspecified: Secondary | ICD-10-CM

## 2022-08-23 DIAGNOSIS — E032 Hypothyroidism due to medicaments and other exogenous substances: Secondary | ICD-10-CM

## 2022-08-23 DIAGNOSIS — L820 Inflamed seborrheic keratosis: Secondary | ICD-10-CM

## 2022-08-23 DIAGNOSIS — J302 Other seasonal allergic rhinitis: Secondary | ICD-10-CM

## 2022-08-23 MED ORDER — SYNTHROID 88 MCG PO TABS: 88.0000 ug | ORAL_TABLET | Freq: Every day | ORAL | 0 refills | Status: DC

## 2022-09-05 ENCOUNTER — Ambulatory Visit (INDEPENDENT_AMBULATORY_CARE_PROVIDER_SITE_OTHER): Payer: Medicare Other | Admitting: Family

## 2022-09-05 ENCOUNTER — Other Ambulatory Visit: Payer: Self-pay | Admitting: Family

## 2022-09-05 ENCOUNTER — Encounter: Payer: Self-pay | Admitting: Family

## 2022-09-05 VITALS — BP 139/86 | HR 56 | Temp 98.0°F | Ht 72.0 in | Wt 218.1 lb

## 2022-09-05 DIAGNOSIS — E7800 Pure hypercholesterolemia, unspecified: Secondary | ICD-10-CM

## 2022-09-05 DIAGNOSIS — E039 Hypothyroidism, unspecified: Secondary | ICD-10-CM

## 2022-09-05 DIAGNOSIS — R351 Nocturia: Secondary | ICD-10-CM

## 2022-09-05 DIAGNOSIS — F418 Other specified anxiety disorders: Secondary | ICD-10-CM

## 2022-09-05 DIAGNOSIS — N401 Enlarged prostate with lower urinary tract symptoms: Secondary | ICD-10-CM | POA: Diagnosis not present

## 2022-09-05 LAB — COMPREHENSIVE METABOLIC PANEL
ALT: 28 U/L (ref 0–53)
AST: 16 U/L (ref 0–37)
Albumin: 3.9 g/dL (ref 3.5–5.2)
Alkaline Phosphatase: 101 U/L (ref 39–117)
BUN: 18 mg/dL (ref 6–23)
CO2: 29 mEq/L (ref 19–32)
Calcium: 8.9 mg/dL (ref 8.4–10.5)
Chloride: 102 mEq/L (ref 96–112)
Creatinine, Ser: 0.81 mg/dL (ref 0.40–1.50)
GFR: 84.8 mL/min (ref >60.00)
Glucose, Bld: 94 mg/dL (ref 70–99)
Potassium: 4.1 mEq/L (ref 3.5–5.1)
Sodium: 137 mEq/L (ref 135–145)
Total Bilirubin: 0.7 mg/dL (ref 0.2–1.2)
Total Protein: 5.8 g/dL — ABNORMAL LOW (ref 6.0–8.3)

## 2022-09-05 LAB — T4, FREE: Free T4: 0.89 ng/dL (ref 0.60–1.60)

## 2022-09-05 LAB — LIPID PANEL
Cholesterol: 116 mg/dL (ref 0–200)
HDL: 41.1 mg/dL (ref >39.00)
LDL Cholesterol: 62 mg/dL (ref 0–99)
NonHDL: 75.37
Total CHOL/HDL Ratio: 3
Triglycerides: 68 mg/dL (ref 0.0–149.0)
VLDL: 13.6 mg/dL (ref 0.0–40.0)

## 2022-09-05 LAB — TSH: TSH: 4.45 u[IU]/mL (ref 0.35–5.50)

## 2022-09-05 LAB — PSA: PSA: 3.63 ng/mL (ref 0.10–4.00)

## 2022-09-05 MED ORDER — ESCITALOPRAM OXALATE 5 MG PO TABS: 5.0000 mg | ORAL_TABLET | Freq: Every day | ORAL | 5 refills | Status: DC

## 2022-09-05 MED ORDER — TAMSULOSIN HCL 0.4 MG PO CAPS: 0.8000 mg | ORAL_CAPSULE | Freq: Every day | ORAL | 0 refills | Status: DC

## 2022-09-05 MED ORDER — ESCITALOPRAM OXALATE 5 MG PO TABS: 5.0000 mg | ORAL_TABLET | Freq: Every day | ORAL | 1 refills | Status: DC

## 2022-09-05 NOTE — Assessment & Plan Note (Signed)
chronic taking Lipitor '80mg'$  checking labs today f/u 6 -21mo

## 2022-09-05 NOTE — Progress Notes (Signed)
Patient ID: Ryan [name], male    DOB: May 14, 1946, 77 y.o.   MRN: [id number]  Chief Complaint  Patient presents with   Transfer of care    Fasting w/ labs    HPI: Anxiety:  HX:  Pt c/o Anxiety, Pt states he takes care of his 61 year sister, she was diagnosed with stage 4 lung cancer and he would like to discuss  Anxiety. He feels like he can't keep his emotions under control & needs to be strong for her. Last visit pt given Lexapro '5mg'$  qd and Xanax 0.'25mg'$  prn. Pt reports tolerating Lexapro, but Xanax caused severe nocturnal sternal pain. Hypothyroidism - On Synthroid 88 mcg daily.  We will check TSH. CAD (coronary artery disease)-  On aspirin and statin. Hypercholesterolemia - Check labs.  He is on Lipitor 80 mg daily. BPH associated with nocturia - Continue Flomax 0.8 mg daily.  Check PSA.  He will let me know if he would like to have referral to urology. Asthma - Stable.  Takes albuterol as needed. GERD (gastroesophageal reflux disease) - Continue Protonix.  Assessment & Plan:   Problem List Items Addressed This Visit       Endocrine   Hypothyroidism    chronic, stable taking Levo 17mg qd denies any sx or concerns rechecking labs today f/u 1 yr      Relevant Orders   T4, free   TSH     Other   Situational anxiety - Primary    New  pt seen a month ago, taking care of sister dying from cancer, very tearful in office started Lexapro '5mg'$ , states this is helping, denies any SE Xanax caused bad GERD sx, no longer taking sent refill of Lexapro f/u 3 mos      Relevant Medications   escitalopram (LEXAPRO) 5 MG tablet   BPH associated with nocturia    chronic, stable Continue Flomax 0.8 mg daily Check PSA today having slight worsening in sx, but too busy taking care of sister to see urology He will let me know if referral to urology needed f/u 696mo     Relevant Medications   tamsulosin (FLOMAX) 0.4 MG CAPS capsule   Other Relevant Orders   PSA    Hypercholesterolemia    chronic taking Lipitor '80mg'$  checking labs today f/u 6 -122mo    Relevant Orders   Lipid panel   Comp Met (CMET)    Subjective:    Outpatient Medications Prior to Visit  Medication Sig Dispense Refill   albuterol (VENTOLIN HFA) 108 (90 Base) MCG/ACT inhaler Inhale 2 puffs into the lungs every 4 (four) hours as needed for wheezing. 1 each 2   ALPRAZolam (XANAX) 0.25 MG tablet Take 1 tablet (0.25 mg total) by mouth 2 (two) times daily as needed for anxiety. 20 tablet 0   aspirin EC 81 MG EC tablet Take 1 tablet (81 mg total) by mouth daily. 30 tablet 11   atorvastatin (LIPITOR) 80 MG tablet Take 1 tablet (80 mg total) by mouth daily. 90 tablet 3   bisacodyl (DULCOLAX) 5 MG EC tablet Take 5 mg by mouth daily as needed for moderate constipation.     Coenzyme Q10 200 MG capsule Take 200 mg by mouth daily.     cyclobenzaprine (FLEXERIL) 5 MG tablet Take 1 tablet (5 mg total) by mouth 3 (three) times daily as needed for muscle spasms. 50 tablet 0   fluorouracil (EFUDEX) 5 % cream Apply 1 application. topically  daily as needed (skin cancer).     nitroGLYCERIN (NITROSTAT) 0.4 MG SL tablet Place 1 tablet (0.4 mg total) under the tongue every 5 (five) minutes as needed for chest pain. 25 tablet 5   pantoprazole (PROTONIX) 20 MG tablet TAKE 1 TABLET BY MOUTH EVERY DAY 90 tablet 1   polyethylene glycol powder (GLYCOLAX/MIRALAX) powder Take 17 g by mouth as needed for mild constipation.      SYNTHROID 88 MCG tablet Take 1 tablet (88 mcg total) by mouth daily. 90 tablet 0   escitalopram (LEXAPRO) 5 MG tablet Take 1 tablet (5 mg total) by mouth daily. 30 tablet 2   tamsulosin (FLOMAX) 0.4 MG CAPS capsule TAKE 2 CAPSULES BY MOUTH EVERY DAY (Patient taking differently: Take 0.4 mg by mouth daily.) 180 capsule 0   No facility-administered medications prior to visit.   Past Medical History:  Diagnosis Date   Allergy    seasonal   Anxiety    Arthritis    Asthma    Cataract     Coronary artery disease 12/08/2016   STEMI, DES Circumflex   GERD (gastroesophageal reflux disease)    Hyperlipidemia    Hypothyroidism    STEMI (ST elevation myocardial infarction) ([facility]) 12/08/2016   Past Surgical History:  Procedure Laterality Date   cataract Bilateral 03/2016   COLONOSCOPY  05/31/2004   COLONOSCOPY  06/30/2018   epidural steroid shot     x 3 , lumbar spine area   EYE SURGERY     bilateral cataracts   LEFT HEART CATH AND CORONARY ANGIOGRAPHY N/A 12/08/2016   Procedure: Left Heart Cath and Coronary Angiography;  Surgeon: Sherren Mocha, MD;  Location: [facility];  Service: Cardiovascular;  Laterality: N/A;   LUMBAR LAMINECTOMY/DECOMPRESSION MICRODISCECTOMY Bilateral 01/21/2022   Procedure: Lumbar Two-Three, Lumbar Three-Four/Lumbar Four-Five LAMINOTOMY/FORAMINOTOMY;  Surgeon: Newman Pies, MD;  Location: [facility];  Service: Neurosurgery;  Laterality: Bilateral;  3C   POLYPECTOMY     SHOULDER SURGERY Right    Rotator Cuff Tear Repair   TONSILLECTOMY     UPPER GASTROINTESTINAL ENDOSCOPY     Allergies  Allergen Reactions   Contrast Media [Iodinated Contrast Media] Shortness Of Breath and Swelling    Swelling of the throat    Erythromycin Nausea And Vomiting   Niacin And Related Swelling   Penicillins Hives    Has patient had a PCN reaction causing immediate rash, facial/tongue/throat swelling, SOB or lightheadedness with hypotension: Y Has patient had a PCN reaction causing severe rash involving mucus membranes or skin necrosis: No Has patient had a PCN reaction that required hospitalization: No Has patient had a PCN reaction occurring within the last 10 years: No If all of the above answers are "NO", then may proceed with Cephalosporin use.   Gabapentin Swelling    Severe swelling in legs      Objective:    Physical Exam Vitals and nursing note reviewed.  Constitutional:      General: He is not in acute distress.    Appearance: Normal  appearance.  HENT:     Head: Normocephalic.  Cardiovascular:     Rate and Rhythm: Normal rate and regular rhythm.  Pulmonary:     Effort: Pulmonary effort is normal.     Breath sounds: Normal breath sounds.  Musculoskeletal:        General: Normal range of motion.     Cervical back: Normal range of motion.  Skin:    General: Skin is warm and dry.  Neurological:     Mental Status: He is alert and oriented to person, place, and time.  Psychiatric:        Mood and Affect: Mood normal.   BP 139/86 (BP Location: Left Arm, Patient Position: Sitting, Cuff Size: Large)   Pulse (!) 56   Temp 98 F (36.7 C) (Temporal)   Ht 6' (1.829 m)   Wt 218 lb 2 oz (98.9 kg)   SpO2 97%   BMI 29.58 kg/m  Wt Readings from Last 3 Encounters:  09/05/22 218 lb 2 oz (98.9 kg)  08/16/22 221 lb 4 oz (100.4 kg)  07/15/22 220 lb (99.8 kg)      Jeanie Sewer, NP

## 2022-09-05 NOTE — Patient Instructions (Addendum)
It was very nice to see you today!    I will review your lab results via MyChart in a few days. I have sent your refills to your pharmacy. Schedule a 3 month follow up visit today.      PLEASE NOTE:  If you had any lab tests please let us know if you have not heard back within a few days. You may see your results on MyChart before we have a chance to review them but we will give you a call once they are reviewed by [place]. If we ordered any referrals today, please let us know if you have not heard from their office within the next week.

## 2022-09-05 NOTE — Addendum Note (Signed)
Addended byJeanie Sewer on: 09/05/2022 06:11 PM   Modules accepted: Orders

## 2022-09-05 NOTE — Telephone Encounter (Signed)
Call Ryan [name] and let him know his insurance will not let me write the RX for 1-2 tabs for his Lexapro. So I have sent a 90 day supply = 90 pills and he can increase to 2 pills per day when he feels needed. This will not be written on the bottle, however, he just needs to know I said this was ok - thx

## 2022-09-05 NOTE — Assessment & Plan Note (Signed)
New  pt seen a month ago, taking care of sister dying from cancer, very tearful in office started Lexapro '5mg'$ , states this is helping, denies any SE Xanax caused bad GERD sx, no longer taking sent refill of Lexapro f/u 3 mos

## 2022-09-05 NOTE — Assessment & Plan Note (Signed)
chronic, stable taking Levo 39mg qd denies any sx or concerns rechecking labs today f/u 1 yr

## 2022-09-05 NOTE — Assessment & Plan Note (Signed)
chronic, stable Continue Flomax 0.8 mg daily Check PSA today having slight worsening in sx, but too busy taking care of sister to see urology He will let me know if referral to urology needed f/u 77mo

## 2022-09-08 NOTE — Progress Notes (Signed)
All of your labs are within normal range.  Glucose (sugar), electrolytes, kidney and liver function, blood count, thyroid, and cholesterol numbers are all good.  Looks like your good # (HDL) improved slightly, possibly from more exercise or eating more fish! Continue same doses of your Synthroid and Lipitor.

## 2022-09-16 ENCOUNTER — Other Ambulatory Visit: Payer: Self-pay | Admitting: Family Medicine

## 2022-09-21 IMAGING — CR DG LUMBAR SPINE 1V
1 series · 1 of 1 positions shown · non-contrast
Comparison: MRI September 08, 2021.

CLINICAL DATA: Intraoperative localization.

EXAM:
LUMBAR SPINE - 1 VIEW

[xtable lateral]
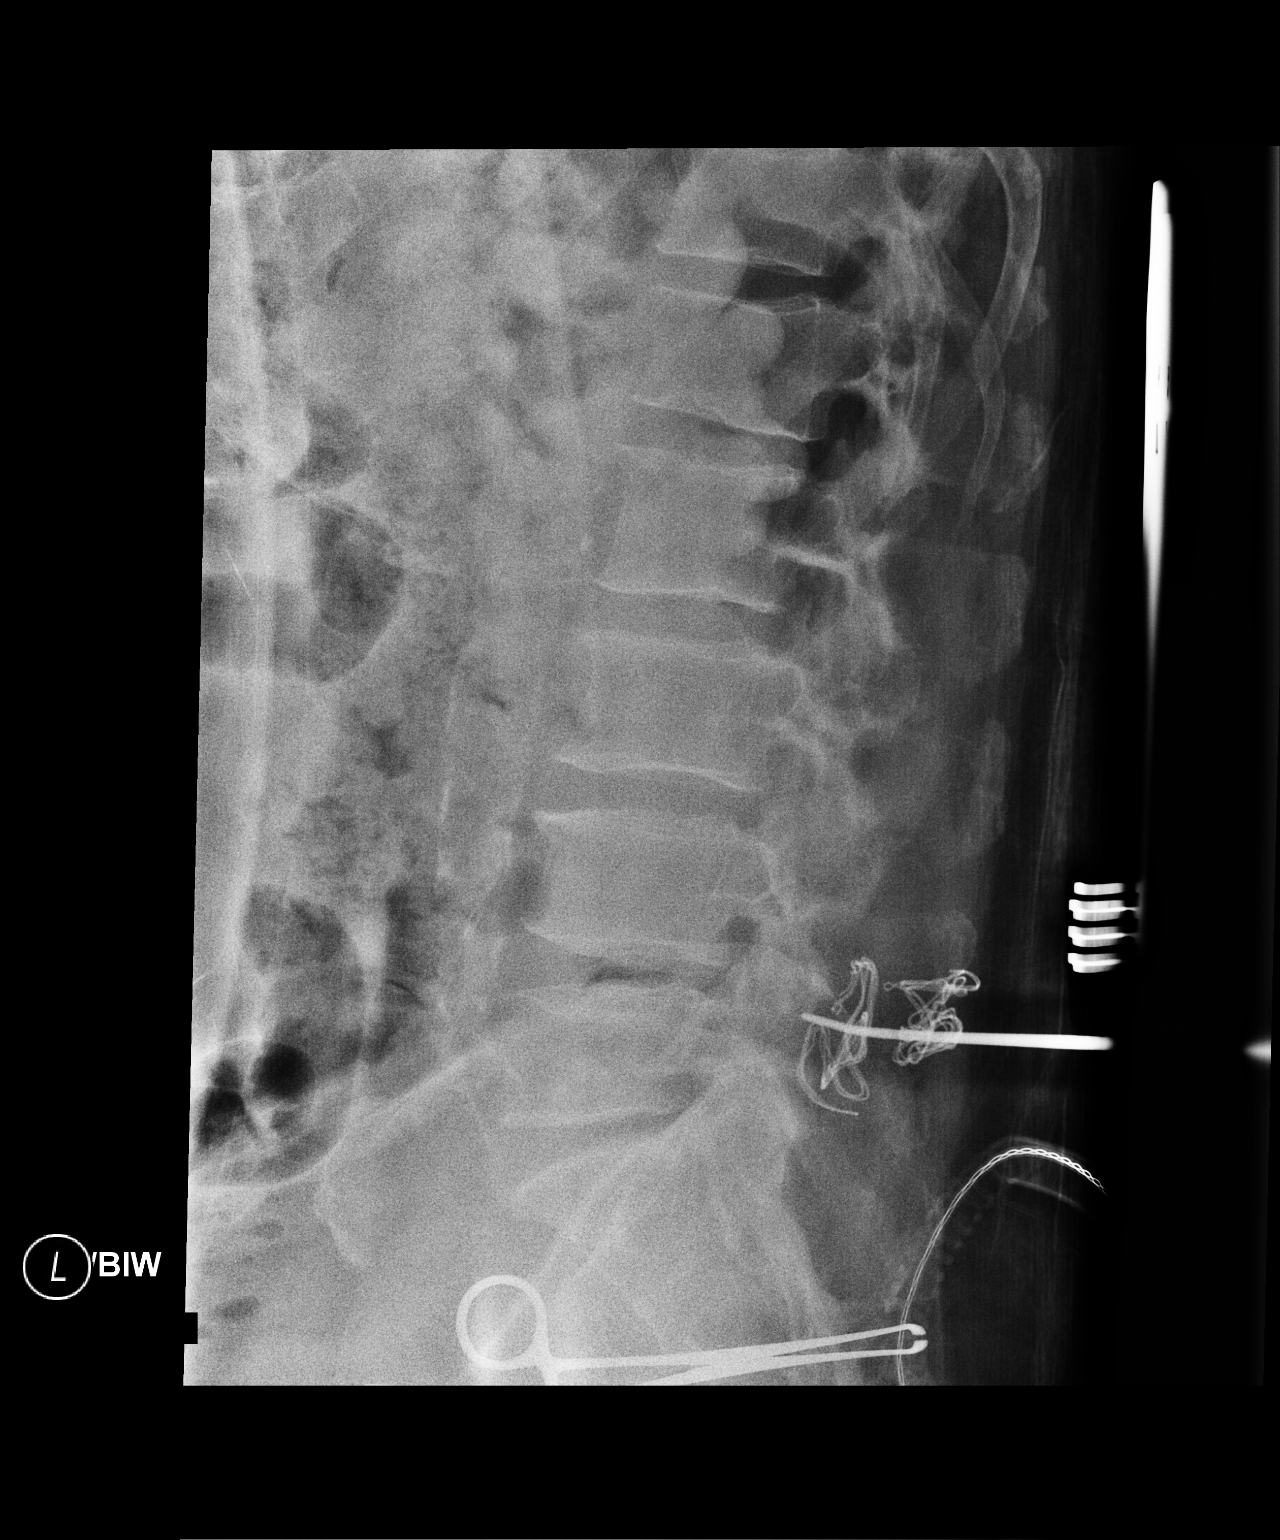

[1 of 1 positions shown; findings below may reference images not displayed]

FINDINGS: Single intraoperative cross-table lateral projection was obtained of
the lumbar spine. This demonstrates surgical probe at approximately
the L4-5 level.
IMPRESSION: Surgical localization as described above.

## 2022-11-20 ENCOUNTER — Other Ambulatory Visit: Payer: Self-pay | Admitting: Family

## 2022-11-20 DIAGNOSIS — E039 Hypothyroidism, unspecified: Secondary | ICD-10-CM

## 2022-11-20 DIAGNOSIS — J302 Other seasonal allergic rhinitis: Secondary | ICD-10-CM

## 2022-11-20 DIAGNOSIS — L820 Inflamed seborrheic keratosis: Secondary | ICD-10-CM

## 2022-11-20 DIAGNOSIS — E032 Hypothyroidism due to medicaments and other exogenous substances: Secondary | ICD-10-CM

## 2022-11-20 DIAGNOSIS — E038 Other specified hypothyroidism: Secondary | ICD-10-CM

## 2022-11-27 ENCOUNTER — Other Ambulatory Visit: Payer: Self-pay | Admitting: Family

## 2022-11-27 DIAGNOSIS — N401 Enlarged prostate with lower urinary tract symptoms: Secondary | ICD-10-CM

## 2022-12-04 ENCOUNTER — Ambulatory Visit (INDEPENDENT_AMBULATORY_CARE_PROVIDER_SITE_OTHER): Payer: Medicare Other | Admitting: Family

## 2022-12-04 VITALS — BP 139/74 | HR 51 | Temp 97.8°F | Ht 72.0 in | Wt 217.8 lb

## 2022-12-04 DIAGNOSIS — F418 Other specified anxiety disorders: Secondary | ICD-10-CM

## 2022-12-04 NOTE — Assessment & Plan Note (Addendum)
Chronic still taking care of sister dying from cancer, but she has gotten good reports lately, but her COPD is debilitating started Lexapro 5mg , still helping, reports it takes away some other good emotions, and so does not take every day advised he should take at least 5days per week, less than that will have little effect no refill needed today f/u 4 mos

## 2022-12-04 NOTE — Progress Notes (Signed)
Patient ID: Ryan [name], male    DOB: August 04, 1946, 77 y.o.   MRN: [id number]  Chief Complaint  Patient presents with   Medical Management of Chronic Issues    HPI: Anxiety:  HX:  Pt c/o Anxiety, Pt states he takes care of his 11 year sister, she was diagnosed with stage 4 lung cancer and he would like to discuss  Anxiety. He feels like he can't keep his emotions under control & needs to be strong for her.  pt is still taking Lexapro 5mg  qd. Pt reports tolerating Lexapro, just causes him not to feel as many good emotions, but does keep him from being too sad.  Assessment & Plan:  Situational anxiety Assessment & Plan: Chronic taking care of sister dying from cancer, very tearful in office started Lexapro 5mg , still helping, reports it takes away some other good emotions, and so does not take every day advised he should take at least 5days per week, less than that will have little effect no refill needed today f/u 4 mos    Subjective:    Outpatient Medications Prior to Visit  Medication Sig Dispense Refill   albuterol (VENTOLIN HFA) 108 (90 Base) MCG/ACT inhaler Inhale 2 puffs into the lungs every 4 (four) hours as needed for wheezing. 1 each 2   aspirin EC 81 MG EC tablet Take 1 tablet (81 mg total) by mouth daily. 30 tablet 11   atorvastatin (LIPITOR) 80 MG tablet Take 1 tablet (80 mg total) by mouth daily. 90 tablet 3   bisacodyl (DULCOLAX) 5 MG EC tablet Take 5 mg by mouth daily as needed for moderate constipation.     Coenzyme Q10 200 MG capsule Take 200 mg by mouth daily.     cyclobenzaprine (FLEXERIL) 5 MG tablet Take 1 tablet (5 mg total) by mouth 3 (three) times daily as needed for muscle spasms. 50 tablet 0   escitalopram (LEXAPRO) 5 MG tablet Take 1 tablet (5 mg total) by mouth daily. 90 tablet 1   fluorouracil (EFUDEX) 5 % cream Apply 1 application. topically daily as needed (skin cancer).     nitroGLYCERIN (NITROSTAT) 0.4 MG SL tablet Place 1 tablet (0.4 mg total)  under the tongue every 5 (five) minutes as needed for chest pain. 25 tablet 5   pantoprazole (PROTONIX) 20 MG tablet TAKE 1 TABLET BY MOUTH EVERY DAY 90 tablet 1   polyethylene glycol powder (GLYCOLAX/MIRALAX) powder Take 17 g by mouth as needed for mild constipation.      SYNTHROID 88 MCG tablet TAKE 1 TABLET BY MOUTH EVERY DAY 90 tablet 0   tamsulosin (FLOMAX) 0.4 MG CAPS capsule TAKE 2 CAPSULES BY MOUTH EVERY DAY 180 capsule 0   ALPRAZolam (XANAX) 0.25 MG tablet Take 1 tablet (0.25 mg total) by mouth 2 (two) times daily as needed for anxiety. (Patient not taking: Reported on 12/04/2022) 20 tablet 0   No facility-administered medications prior to visit.   Past Medical History:  Diagnosis Date   Allergy    seasonal   Anxiety    Arthritis    Asthma    Cataract    Coronary artery disease 12/08/2016   STEMI, DES Circumflex   GERD (gastroesophageal reflux disease)    Hyperlipidemia    Hypothyroidism    STEMI (ST elevation myocardial infarction) ([facility]) 12/08/2016   Past Surgical History:  Procedure Laterality Date   cataract Bilateral 03/2016   COLONOSCOPY  05/31/2004   COLONOSCOPY  06/30/2018   epidural steroid shot  x 3 , lumbar spine area   EYE SURGERY     bilateral cataracts   LEFT HEART CATH AND CORONARY ANGIOGRAPHY N/A 12/08/2016   Procedure: Left Heart Cath and Coronary Angiography;  Surgeon: Tonny Bollman, MD;  Location: [facility];  Service: Cardiovascular;  Laterality: N/A;   LUMBAR LAMINECTOMY/DECOMPRESSION MICRODISCECTOMY Bilateral 01/21/2022   Procedure: Lumbar Two-Three, Lumbar Three-Four/Lumbar Four-Five LAMINOTOMY/FORAMINOTOMY;  Surgeon: Tressie Stalker, MD;  Location: [facility];  Service: Neurosurgery;  Laterality: Bilateral;  3C   POLYPECTOMY     SHOULDER SURGERY Right    Rotator Cuff Tear Repair   TONSILLECTOMY     UPPER GASTROINTESTINAL ENDOSCOPY     Allergies  Allergen Reactions   Contrast Media [Iodinated Contrast Media] Shortness Of Breath and  Swelling    Swelling of the throat    Erythromycin Nausea And Vomiting   Niacin And Related Swelling   Penicillins Hives    Has patient had a PCN reaction causing immediate rash, facial/tongue/throat swelling, SOB or lightheadedness with hypotension: Y Has patient had a PCN reaction causing severe rash involving mucus membranes or skin necrosis: No Has patient had a PCN reaction that required hospitalization: No Has patient had a PCN reaction occurring within the last 10 years: No If all of the above answers are "NO", then may proceed with Cephalosporin use.   Gabapentin Swelling    Severe swelling in legs      Objective:    Physical Exam Vitals and nursing note reviewed.  Constitutional:      General: He is not in acute distress.    Appearance: Normal appearance.  HENT:     Head: Normocephalic.  Cardiovascular:     Rate and Rhythm: Normal rate and regular rhythm.  Pulmonary:     Effort: Pulmonary effort is normal.     Breath sounds: Normal breath sounds.  Musculoskeletal:        General: Normal range of motion.     Cervical back: Normal range of motion.  Skin:    General: Skin is warm and dry.  Neurological:     Mental Status: He is alert and oriented to person, place, and time.  Psychiatric:        Mood and Affect: Mood normal.    BP 139/74 (BP Location: Left Arm, Patient Position: Sitting, Cuff Size: Large)   Pulse (!) 51   Temp 97.8 F (36.6 C) (Temporal)   Ht 6' (1.829 m)   Wt 217 lb 12.8 oz (98.8 kg)   SpO2 94%   BMI 29.54 kg/m  Wt Readings from Last 3 Encounters:  12/04/22 217 lb 12.8 oz (98.8 kg)  09/05/22 218 lb 2 oz (98.9 kg)  08/16/22 221 lb 4 oz (100.4 kg)      Dulce Sellar, NP

## 2022-12-21 ENCOUNTER — Observation Stay (HOSPITAL_COMMUNITY)
Admission: EM | Admit: 2022-12-21 | Discharge: 2022-12-22 | Disposition: A | Discharge status: Home / Self Care | Payer: Medicare Other | Attending: Family Medicine | Admitting: Family Medicine

## 2022-12-21 ENCOUNTER — Encounter (HOSPITAL_COMMUNITY): Payer: Self-pay

## 2022-12-21 ENCOUNTER — Emergency Department (HOSPITAL_COMMUNITY): Payer: Medicare Other

## 2022-12-21 DIAGNOSIS — I2510 Atherosclerotic heart disease of native coronary artery without angina pectoris: Secondary | ICD-10-CM | POA: Diagnosis present

## 2022-12-21 DIAGNOSIS — Z955 Presence of coronary angioplasty implant and graft: Secondary | ICD-10-CM | POA: Diagnosis not present

## 2022-12-21 DIAGNOSIS — I10 Essential (primary) hypertension: Secondary | ICD-10-CM | POA: Diagnosis not present

## 2022-12-21 DIAGNOSIS — R079 Chest pain, unspecified: Secondary | ICD-10-CM | POA: Diagnosis present

## 2022-12-21 DIAGNOSIS — E039 Hypothyroidism, unspecified: Secondary | ICD-10-CM | POA: Diagnosis present

## 2022-12-21 DIAGNOSIS — Z79899 Other long term (current) drug therapy: Secondary | ICD-10-CM | POA: Insufficient documentation

## 2022-12-21 DIAGNOSIS — J45909 Unspecified asthma, uncomplicated: Secondary | ICD-10-CM | POA: Diagnosis not present

## 2022-12-21 DIAGNOSIS — F39 Unspecified mood [affective] disorder: Secondary | ICD-10-CM | POA: Diagnosis present

## 2022-12-21 DIAGNOSIS — Z7982 Long term (current) use of aspirin: Secondary | ICD-10-CM | POA: Diagnosis not present

## 2022-12-21 DIAGNOSIS — F418 Other specified anxiety disorders: Secondary | ICD-10-CM | POA: Diagnosis present

## 2022-12-21 LAB — BASIC METABOLIC PANEL
Anion gap: 9 (ref 5–15)
BUN: 20 mg/dL (ref 8–23)
CO2: 24 mmol/L (ref 22–32)
Calcium: 8.9 mg/dL (ref 8.9–10.3)
Chloride: 104 mmol/L (ref 98–111)
Creatinine, Ser: 0.89 mg/dL (ref 0.61–1.24)
GFR, Estimated: >60 mL/min (ref >60)
Glucose, Bld: 122 mg/dL — ABNORMAL HIGH (ref 70–99)
Potassium: 3.7 mmol/L (ref 3.5–5.1)
Sodium: 137 mmol/L (ref 135–145)

## 2022-12-21 LAB — CBC
HCT: 41.9 % (ref 39.0–52.0)
Hemoglobin: 13.9 g/dL (ref 13.0–17.0)
MCH: 31.4 pg (ref 26.0–34.0)
MCHC: 33.2 g/dL (ref 30.0–36.0)
MCV: 94.6 fL (ref 80.0–100.0)
Platelets: 243 10*3/uL (ref 150–400)
RBC: 4.43 MIL/uL (ref 4.22–5.81)
RDW: 12.8 % (ref 11.5–15.5)
WBC: 14 10*3/uL — ABNORMAL HIGH (ref 4.0–10.5)
nRBC: 0 % (ref 0.0–0.2)

## 2022-12-21 LAB — TROPONIN I (HIGH SENSITIVITY): Troponin I (High Sensitivity): 5 ng/L (ref <18)

## 2022-12-21 NOTE — ED Triage Notes (Signed)
Pt coming from central chest pain 9/10 with breathing that started 3 days ago but today it started feeling differently, described as sharp pain. Pt took 4 tabs of aspirin and 2 nitro tabs at home and given another 2 en route by EMS. PMH of N-STEMI. Pt denies dizziness, N/V.

## 2022-12-21 NOTE — ED Provider Notes (Signed)
[place] EMERGENCY DEPARTMENT AT [facility] [address] Provider Note   CSN: [id number] Arrival date & time: 12/21/22  2200     History {Add pertinent medical, surgical, social history, OB history to HPI:1} Chief Complaint  Patient presents with   Chest Pain    Ryan [name] is a 77 y.o. male.  77 year old male presents with complaint of mid sternal chest discomfort. Reports onset of symptoms 3 days ago, states he has had a lot going on in life (family member with cancer has recently moved into the home) and has not paid much attention to his symptoms until today when the discomfort became more constant and intense. Patient took Gaviscon and an antacid as well as 2 nitro and 8 81mg  ASA. Unsure which of these medications helped with his symptoms but does feel like he has had slight relief. Notes the last time he thought he had bad indigestion he had a heart attack (2019 STEMI with stent x1). Symptoms somewhat worse with exertion today. Discomfort improved today when sitting more upright in the stretcher. Denies associated SHOB, nausea, vomiting, headache, dizziness, diaphoresis, lower extremity swelling.         Home Medications Prior to Admission medications   Medication Sig Start Date End Date Taking? Authorizing Provider  albuterol (VENTOLIN HFA) 108 (90 Base) MCG/ACT inhaler Inhale 2 puffs into the lungs every 4 (four) hours as needed for wheezing. 05/03/21   Jarold Motto, PA  aspirin EC 81 MG EC tablet Take 1 tablet (81 mg total) by mouth daily. 12/11/16   Marlon Pel, PA-C  atorvastatin (LIPITOR) 80 MG tablet Take 1 tablet (80 mg total) by mouth daily. 03/13/22   Tonny Bollman, MD  bisacodyl (DULCOLAX) 5 MG EC tablet Take 5 mg by mouth daily as needed for moderate constipation.    [provider]  Coenzyme Q10 200 MG capsule Take 200 mg by mouth daily.    [provider]  cyclobenzaprine (FLEXERIL) 5 MG tablet Take 1 tablet (5 mg total) by mouth 3  (three) times daily as needed for muscle spasms. 01/22/22   Tressie Stalker, MD  escitalopram (LEXAPRO) 5 MG tablet Take 1 tablet (5 mg total) by mouth daily. 09/05/22   Dulce Sellar, NP  fluorouracil (EFUDEX) 5 % cream Apply 1 application. topically daily as needed (skin cancer). 08/18/18   [provider]  nitroGLYCERIN (NITROSTAT) 0.4 MG SL tablet Place 1 tablet (0.4 mg total) under the tongue every 5 (five) minutes as needed for chest pain. 03/13/22   Tonny Bollman, MD  pantoprazole (PROTONIX) 20 MG tablet TAKE 1 TABLET BY MOUTH EVERY DAY 09/16/22   Dulce Sellar, NP  polyethylene glycol powder (GLYCOLAX/MIRALAX) powder Take 17 g by mouth as needed for mild constipation.     [provider]  SYNTHROID 88 MCG tablet TAKE 1 TABLET BY MOUTH EVERY DAY 11/20/22   Dulce Sellar, NP  tamsulosin (FLOMAX) 0.4 MG CAPS capsule TAKE 2 CAPSULES BY MOUTH EVERY DAY 11/27/22   Dulce Sellar, NP      Allergies    Contrast media [iodinated contrast media], Erythromycin, Niacin and related, Penicillins, and Gabapentin    Review of Systems   Review of Systems Negative except as per HPI Physical Exam Updated Vital Signs BP (!) 140/67 (BP Location: Right Arm)   Pulse 64   Temp 97.6 F (36.4 C) (Oral)   Resp 14   SpO2 95%  Physical Exam Vitals and nursing note reviewed.  Constitutional:  General: He is not in acute distress.    Appearance: He is well-developed. He is not diaphoretic.  HENT:     Head: Normocephalic and atraumatic.  Cardiovascular:     Rate and Rhythm: Normal rate and regular rhythm.     Heart sounds: Normal heart sounds. No murmur heard. Pulmonary:     Effort: Pulmonary effort is normal.     Breath sounds: Normal breath sounds.  Chest:     Chest wall: No tenderness.  Abdominal:     Palpations: Abdomen is soft.     Tenderness: There is no abdominal tenderness.  Musculoskeletal:     Right lower leg: No tenderness. No edema.     Left lower  leg: No tenderness. No edema.  Skin:    General: Skin is warm and dry.  Neurological:     Mental Status: He is alert and oriented to person, place, and time.  Psychiatric:        Behavior: Behavior normal.     ED Results / Procedures / Treatments   Labs (all labs ordered are listed, but only abnormal results are displayed) Labs Reviewed - No data to display  EKG EKG Interpretation  Date/Time:  Saturday Dec 21 2022 22:08:28 EDT Ventricular Rate:  62 PR Interval:  161 QRS Duration: 101 QT Interval:  408 QTC Calculation: 415 R Axis:   -12 Text Interpretation: Sinus rhythm Confirmed by Vonita Moss [phone]) on 12/21/2022 10:10:29 PM  Radiology No results found.  Procedures Procedures  {Document cardiac monitor, telemetry assessment procedure when appropriate:1}  Medications Ordered in ED Medications - No data to display  ED Course/ Medical Decision Making/ A&P   {   Click here for ABCD2, HEART and other calculatorsREFRESH Note before signing :1}                          Medical Decision Making Amount and/or Complexity of Data Reviewed Labs: ordered. Radiology: ordered.   This patient presents to the ED for concern of chest pain, this involves an extensive number of treatment options, and is a complaint that carries with it a high risk of complications and morbidity.  The differential diagnosis includes but not limited to ACS, arrhythmia, GERD   Co morbidities that complicate the patient evaluation  Anxiety, hypothyroid, arthritis, asthma, hyperlipidemia, CAD (STEMI with stent), GERD.   Additional history obtained:  Additional history obtained from EMS who contributes to history as above External records from outside source obtained and reviewed including visit to cardiology, Dr. Excell Seltzer dated 03/13/2022.   Lab Tests:  I Ordered, and personally interpreted labs.  The pertinent results include: BMP without significant findings.  CBC with nonspecific  leukocytosis at 14,000.  Initial troponin is 5.  Repeat troponin is***   Imaging Studies ordered:  I ordered imaging studies including chest x-ray I independently visualized and interpreted imaging which showed *** I agree with the radiologist interpretation   Cardiac Monitoring: / EKG:  The patient was maintained on a cardiac monitor.  I personally viewed and interpreted the cardiac monitored which showed an underlying rhythm of: Sinus rhythm, rate 62   Consultations Obtained:  I requested consultation with the ***,  and discussed lab and imaging findings as well as pertinent plan - they recommend: ***   Problem List / ED Course / Critical interventions / Medication management  *** I ordered medication including ***  for ***  Reevaluation of the patient after these medicines showed that the  patient {resolved/improved/worsened:23923::"improved"} I have reviewed the patients home medicines and have made adjustments as needed   Social Determinants of Health:  Lives at home, sister has recently moved into his home due to cancer/needing help with care   Test / Admission - Considered:  ***   {Document critical care time when appropriate:1} {Document review of labs and clinical decision tools ie heart score, Chads2Vasc2 etc:1}  {Document your independent review of radiology images, and any outside records:1} {Document your discussion with family members, caretakers, and with consultants:1} {Document social determinants of health affecting pt's care:1} {Document your decision making why or why not admission, treatments were needed:1} Final Clinical Impression(s) / ED Diagnoses Final diagnoses:  None    Rx / DC Orders ED Discharge Orders     None

## 2022-12-22 ENCOUNTER — Encounter (HOSPITAL_COMMUNITY): Payer: Self-pay | Admitting: Family Medicine

## 2022-12-22 DIAGNOSIS — E039 Hypothyroidism, unspecified: Secondary | ICD-10-CM

## 2022-12-22 DIAGNOSIS — I2510 Atherosclerotic heart disease of native coronary artery without angina pectoris: Secondary | ICD-10-CM

## 2022-12-22 DIAGNOSIS — R079 Chest pain, unspecified: Secondary | ICD-10-CM | POA: Diagnosis not present

## 2022-12-22 DIAGNOSIS — F418 Other specified anxiety disorders: Secondary | ICD-10-CM

## 2022-12-22 LAB — COMPREHENSIVE METABOLIC PANEL
ALT: 25 U/L (ref 0–44)
AST: 22 U/L (ref 15–41)
Albumin: 3.6 g/dL (ref 3.5–5.0)
Alkaline Phosphatase: 91 U/L (ref 38–126)
Anion gap: 7 (ref 5–15)
BUN: 17 mg/dL (ref 8–23)
CO2: 26 mmol/L (ref 22–32)
Calcium: 8.7 mg/dL — ABNORMAL LOW (ref 8.9–10.3)
Chloride: 101 mmol/L (ref 98–111)
Creatinine, Ser: 0.92 mg/dL (ref 0.61–1.24)
GFR, Estimated: >60 mL/min (ref >60)
Glucose, Bld: 123 mg/dL — ABNORMAL HIGH (ref 70–99)
Potassium: 4.1 mmol/L (ref 3.5–5.1)
Sodium: 134 mmol/L — ABNORMAL LOW (ref 135–145)
Total Bilirubin: 1.1 mg/dL (ref 0.3–1.2)
Total Protein: 6.2 g/dL — ABNORMAL LOW (ref 6.5–8.1)

## 2022-12-22 LAB — CBC
HCT: 42.2 % (ref 39.0–52.0)
Hemoglobin: 14 g/dL (ref 13.0–17.0)
MCH: 31.1 pg (ref 26.0–34.0)
MCHC: 33.2 g/dL (ref 30.0–36.0)
MCV: 93.8 fL (ref 80.0–100.0)
Platelets: 248 10*3/uL (ref 150–400)
RBC: 4.5 MIL/uL (ref 4.22–5.81)
RDW: 13 % (ref 11.5–15.5)
WBC: 13.2 10*3/uL — ABNORMAL HIGH (ref 4.0–10.5)
nRBC: 0 % (ref 0.0–0.2)

## 2022-12-22 LAB — D-DIMER, QUANTITATIVE: D-Dimer, Quant: 0.49 ug/mL-FEU (ref 0.00–0.50)

## 2022-12-22 LAB — TROPONIN I (HIGH SENSITIVITY): Troponin I (High Sensitivity): 6 ng/L (ref <18)

## 2022-12-22 LAB — C-REACTIVE PROTEIN: CRP: 0.9 mg/dL (ref <1.0)

## 2022-12-22 LAB — SEDIMENTATION RATE: Sed Rate: 20 mm/hr — ABNORMAL HIGH (ref 0–16)

## 2022-12-22 MED ORDER — ATORVASTATIN CALCIUM 40 MG PO TABS
80.0000 mg | ORAL_TABLET | Freq: Every day | ORAL | Status: DC
  Administered 2022-12-22: 80 mg via ORAL
  Filled 2022-12-22: qty 2

## 2022-12-22 MED ORDER — OXYCODONE HCL 5 MG PO TABS
5.0000 mg | ORAL_TABLET | ORAL | Status: DC | PRN
  Administered 2022-12-22: 5 mg via ORAL
  Filled 2022-12-22: qty 1

## 2022-12-22 MED ORDER — ENOXAPARIN SODIUM 40 MG/0.4ML IJ SOSY
40.0000 mg | PREFILLED_SYRINGE | INTRAMUSCULAR | Status: DC
  Administered 2022-12-22: 40 mg via SUBCUTANEOUS
  Filled 2022-12-22: qty 0.4

## 2022-12-22 MED ORDER — LIDOCAINE VISCOUS HCL 2 % MT SOLN
15.0000 mL | Freq: Once | OROMUCOSAL | Status: AC
  Administered 2022-12-22: 15 mL via ORAL
  Filled 2022-12-22: qty 15

## 2022-12-22 MED ORDER — ONDANSETRON HCL 4 MG/2ML IJ SOLN: 4.0000 mg | Freq: Four times a day (QID) | INTRAMUSCULAR | Status: DC | PRN

## 2022-12-22 MED ORDER — ESCITALOPRAM OXALATE 10 MG PO TABS
5.0000 mg | ORAL_TABLET | Freq: Every day | ORAL | Status: DC
  Administered 2022-12-22: 5 mg via ORAL
  Filled 2022-12-22: qty 1

## 2022-12-22 MED ORDER — ALUM & MAG HYDROXIDE-SIMETH 200-200-20 MG/5ML PO SUSP
30.0000 mL | Freq: Once | ORAL | Status: AC
  Administered 2022-12-22: 30 mL via ORAL
  Filled 2022-12-22: qty 30

## 2022-12-22 MED ORDER — TAMSULOSIN HCL 0.4 MG PO CAPS
0.8000 mg | ORAL_CAPSULE | Freq: Every day | ORAL | Status: DC
  Administered 2022-12-22: 0.8 mg via ORAL
  Filled 2022-12-22: qty 2

## 2022-12-22 MED ORDER — ENOXAPARIN SODIUM 40 MG/0.4ML IJ SOSY: 40.0000 mg | PREFILLED_SYRINGE | INTRAMUSCULAR | Status: DC

## 2022-12-22 MED ORDER — LEVOTHYROXINE SODIUM 88 MCG PO TABS
88.0000 ug | ORAL_TABLET | Freq: Every day | ORAL | Status: DC
  Administered 2022-12-22: 88 ug via ORAL
  Filled 2022-12-22: qty 1

## 2022-12-22 MED ORDER — PANTOPRAZOLE SODIUM 20 MG PO TBEC
20.0000 mg | DELAYED_RELEASE_TABLET | Freq: Every day | ORAL | Status: DC
  Administered 2022-12-22: 20 mg via ORAL
  Filled 2022-12-22: qty 1

## 2022-12-22 MED ORDER — ACETAMINOPHEN 325 MG PO TABS: 650.0000 mg | ORAL_TABLET | ORAL | Status: DC | PRN

## 2022-12-22 MED ORDER — ONDANSETRON HCL 4 MG/2ML IJ SOLN
4.0000 mg | Freq: Once | INTRAMUSCULAR | Status: AC
  Administered 2022-12-22: 4 mg via INTRAVENOUS
  Filled 2022-12-22: qty 2

## 2022-12-22 MED ORDER — MORPHINE SULFATE (PF) 4 MG/ML IV SOLN
4.0000 mg | Freq: Once | INTRAVENOUS | Status: AC
  Administered 2022-12-22: 4 mg via INTRAVENOUS
  Filled 2022-12-22: qty 1

## 2022-12-22 MED ORDER — ASPIRIN 81 MG PO TBEC
81.0000 mg | DELAYED_RELEASE_TABLET | Freq: Every day | ORAL | Status: DC
  Administered 2022-12-22: 81 mg via ORAL
  Filled 2022-12-22: qty 1

## 2022-12-22 NOTE — Assessment & Plan Note (Signed)
Continue aspirin, atorvastatin.   

## 2022-12-22 NOTE — Consult Note (Signed)
Cardiology Consultation   Patient ID: Ryan [name] MRN: [id number]; DOB: 1945/11/24  Admit date: 12/21/2022 Date of Consult: 12/22/2022  PCP:  Dulce Sellar, NP   [facility] Providers Cardiologist:  Ryan Bollman, MD        Patient Profile:   Ryan [name] is a 77 y.o. male with a hx of hypertension, hypothyroidism, anxiety, and CAD with history of PCI in May 2018  who is being seen 12/22/2022 for the evaluation of chest pain at the request of Ryan [name]  History of Present Illness:   Ryan [name] is a 77 y.o. male with a hx of hypertension, hypothyroidism, anxiety, and CAD with history of PCI in May 2018  who is being seen 12/22/2022 for the evaluation of chest pain at the request of Ryan [name]. Patient has been doing well since 2018. He reports frequent indigestion episodes. However since few days has been having pain in central chest and has become more intense and constant. GI cocktail has not helped with the pain. Pain is worsened with deep breathing. No N/V or SOB. Aspirin and NTG were given by EMS but pain has persisted. EKG demonstrates sinus rhythm. Chest x-ray is negative for acute cardiopulmonary disease. Labs are notable for WBC 14,000 and normal troponin x 2. He had a LHC in 2018 which had shown residual disease in LAD and RCA and was thought will be best managed medically. Cardiology was consulted.    Past Medical History:  Diagnosis Date   Allergy    seasonal   Anxiety    Arthritis    Asthma    Cataract    Coronary artery disease 12/08/2016   STEMI, DES Circumflex   GERD (gastroesophageal reflux disease)    Hyperlipidemia    Hypothyroidism    STEMI (ST elevation myocardial infarction) (HCC) 12/08/2016    Past Surgical History:  Procedure Laterality Date   cataract Bilateral 03/2016   COLONOSCOPY  05/31/2004   COLONOSCOPY  06/30/2018   epidural steroid shot     x 3 , lumbar spine area   EYE SURGERY     bilateral cataracts   LEFT HEART CATH  AND CORONARY ANGIOGRAPHY N/A 12/08/2016   Procedure: Left Heart Cath and Coronary Angiography;  Surgeon: Ryan Bollman, MD;  Location: [facility] INVASIVE CV LAB;  Service: Cardiovascular;  Laterality: N/A;   LUMBAR LAMINECTOMY/DECOMPRESSION MICRODISCECTOMY Bilateral 01/21/2022   Procedure: Lumbar Two-Three, Lumbar Three-Four/Lumbar Four-Five LAMINOTOMY/FORAMINOTOMY;  Surgeon: Tressie Stalker, MD;  Location: [facility] OR;  Service: Neurosurgery;  Laterality: Bilateral;  3C   POLYPECTOMY     SHOULDER SURGERY Right    Rotator Cuff Tear Repair   TONSILLECTOMY     UPPER GASTROINTESTINAL ENDOSCOPY         Inpatient Medications: Scheduled Meds:  aspirin EC  81 mg Oral Daily   atorvastatin  80 mg Oral Daily   enoxaparin (LOVENOX) injection  40 mg Subcutaneous Q24H   escitalopram  5 mg Oral Daily   levothyroxine  88 mcg Oral Daily   pantoprazole  20 mg Oral Daily   tamsulosin  0.8 mg Oral Daily   Continuous Infusions:  PRN Meds: acetaminophen, ondansetron (ZOFRAN) IV, oxyCODONE  Allergies:    Allergies  Allergen Reactions   Contrast Media [Iodinated Contrast Media] Shortness Of Breath and Swelling    Swelling of the throat    Erythromycin Nausea And Vomiting   Niacin And Related Swelling   Penicillins Hives    Has patient had a PCN reaction causing immediate  rash, facial/tongue/throat swelling, SOB or lightheadedness with hypotension: Y Has patient had a PCN reaction causing severe rash involving mucus membranes or skin necrosis: No Has patient had a PCN reaction that required hospitalization: No Has patient had a PCN reaction occurring within the last 10 years: No If all of the above answers are "NO", then may proceed with Cephalosporin use.   Gabapentin Swelling    Severe swelling in legs    Social History:   Social History   Socioeconomic History   Marital status: Married    Spouse name: Not on file   Number of children: 2   Years of education: Not on file   Highest education  level: Not on file  Occupational History   Occupation: retired  Tobacco Use   Smoking status: Never   Smokeless tobacco: Never  Vaping Use   Vaping Use: Never used  Substance and Sexual Activity   Alcohol use: Yes    Alcohol/week: 3.0 standard drinks of alcohol    Types: 3 Glasses of wine per week    Comment: occasional   Drug use: No   Sexual activity: Not on file  Other Topics Concern   Not on file  Social History Narrative   Not on file   Social Determinants of Health   Financial Resource Strain: Low Risk  (07/15/2022)   Overall Financial Resource Strain (CARDIA)    Difficulty of Paying Living Expenses: Not hard at all  Food Insecurity: No Food Insecurity (07/15/2022)   Hunger Vital Sign    Worried About Running Out of Food in the Last Year: Never true    Ran Out of Food in the Last Year: Never true  Transportation Needs: No Transportation Needs (07/15/2022)   PRAPARE - Administrator, Civil Service (Medical): No    Lack of Transportation (Non-Medical): No  Physical Activity: Inactive (07/15/2022)   Exercise Vital Sign    Days of Exercise per Week: 0 days    Minutes of Exercise per Session: 0 min  Stress: No Stress Concern Present (07/15/2022)   [employer] of Occupational Health - Occupational Stress Questionnaire    Feeling of Stress : Not at all  Social Connections: Moderately Integrated (07/15/2022)   Social Connection and Isolation Panel [NHANES]    Frequency of Communication with Friends and Family: More than three times a week    Frequency of Social Gatherings with Friends and Family: More than three times a week    Attends Religious Services: More than 4 times per year    Active Member of [employer] or Organizations: No    Attends Banker Meetings: Never    Marital Status: Married  Catering manager Violence: Not At Risk (07/15/2022)   Humiliation, Afraid, Rape, and Kick questionnaire    Fear of Current or Ex-Partner: No     Emotionally Abused: No    Physically Abused: No    Sexually Abused: No    Family History:   Family History  Problem Relation Age of Onset   Diabetes Mother    Heart failure Mother    Irritable bowel syndrome Mother    Diverticulosis Mother    Lung cancer Father        worked in [employer]   Heart disease Sister    Irritable bowel syndrome Sister    Diverticulosis Sister    Colon cancer Neg Hx    Esophageal cancer Neg Hx    Pancreatic cancer Neg Hx    Stomach  cancer Neg Hx      ROS:  Please see the history of present illness.  All other ROS reviewed and negative.     Physical Exam/Data:   Vitals:   12/22/22 0145 12/22/22 0215 12/22/22 0245 12/22/22 0306  BP: 137/75 (!) 142/76 (!) 145/71   Pulse: 61 64 67   Resp: 19 18 19    Temp:    99.1 F (37.3 C)  TempSrc:    Oral  SpO2: 95% 96% 95%    No intake or output data in the 24 hours ending 12/22/22 0505    12/04/2022    9:06 AM 09/05/2022    8:41 AM 08/16/2022    4:28 PM  Last 3 Weights  Weight (lbs) 217 lb 12.8 oz 218 lb 2 oz 221 lb 4 oz  Weight (kg) 98.793 kg 98.941 kg 100.358 kg     There is no height or weight on file to calculate BMI.  General:  Well nourished, well developed, in no acute distress HEENT: normal Neck: no JVD Vascular: No carotid bruits; Distal pulses 2+ bilaterally Cardiac:  normal S1, S2; RRR; no murmur  Lungs:  clear to auscultation bilaterally, no wheezing, rhonchi or rales  Abd: soft, nontender, no hepatomegaly  Ext: no edema Musculoskeletal:  No deformities, BUE and BLE strength normal and equal Skin: warm and dry  Neuro:  CNs 2-12 intact, no focal abnormalities noted Psych:  Normal affect   EKG:  The EKG was personally reviewed and demonstrates no significant ST changes  Relevant CV Studies:  LHC in 2018:  Severe thrombotic stenosis of the left circumflex treated successfully with Primary PCI using a 3.5x24 mm Promus DES 2. Moderate diffuse LAD, RCA, and ramus intermedius  stenoses 3. Normal LV function   Recommend: Aggressive medical therapy The patient's residual CAD affects small caliber vessels and will be best managed medically Aggrastat x 6 hours DAPT with ASA and brilinta x 12 months without interruption Anticipate discharge 48 hours if no complications  Laboratory Data:  High Sensitivity Troponin:   Recent Labs  Lab 12/21/22 2213 12/22/22 0040  TROPONINIHS 5 6     Chemistry Recent Labs  Lab 12/21/22 2213 12/22/22 0255  NA 137 134*  K 3.7 4.1  CL 104 101  CO2 24 26  GLUCOSE 122* 123*  BUN 20 17  CREATININE 0.89 0.92  CALCIUM 8.9 8.7*  GFRNONAA >60 >60  ANIONGAP 9 7    Recent Labs  Lab 12/22/22 0255  PROT 6.2*  ALBUMIN 3.6  AST 22  ALT 25  ALKPHOS 91  BILITOT 1.1   Lipids No results for input(s): "CHOL", "TRIG", "HDL", "LABVLDL", "LDLCALC", "CHOLHDL" in the last 168 hours.  Hematology Recent Labs  Lab 12/21/22 2213 12/22/22 0255  WBC 14.0* 13.2*  RBC 4.43 4.50  HGB 13.9 14.0  HCT 41.9 42.2  MCV 94.6 93.8  MCH 31.4 31.1  MCHC 33.2 33.2  RDW 12.8 13.0  PLT 243 248   Thyroid No results for input(s): "TSH", "FREET4" in the last 168 hours.  BNPNo results for input(s): "BNP", "PROBNP" in the last 168 hours.  DDimer  Recent Labs  Lab 12/22/22 0255  DDIMER 0.49     Radiology/Studies:  DG Chest Port 1 View  Result Date: 12/21/2022 CLINICAL DATA:  Chest pain EXAM: PORTABLE CHEST 1 VIEW COMPARISON:  03/05/2017 FINDINGS: Single frontal view of the chest demonstrates an unremarkable cardiac silhouette. No acute airspace disease, effusion, or pneumothorax. No acute bony abnormalities. IMPRESSION: 1. No acute  intrathoracic process. Electronically Signed   By: Sharlet Salina M.D.   On: 12/21/2022 23:39     Assessment and Plan:   # Chest pain # CAD s/p PCI to Lcx in 2018  -Patient had a STEMI in 2018- DES to left circumflex in 2018  -At that time residual disease in LAD and RCA and was thought will be best managed  medically.  -Now coming with persistent CP. EKG and troponin reassuring -Due to risk factors, persistent CP, will consider further work up with stress test -Echo in AM -Aspirin 81 mg -Atorvastatin 80 mg -Check ESR and CRP and D-dimer as atypical CP   For questions or updates, please contact [facility] Please consult [email or web address] for contact info under    Signed, Hermelinda Dellen, MD  12/22/2022 5:05 AM

## 2022-12-22 NOTE — H&P (Signed)
History and Physical    JONQUEZ COURIER UJW:[id number] DOB: 09-20-1945 DOA: 12/21/2022  PCP: Dulce Sellar, NP   Patient coming from: Home   Chief Complaint: Chest pain   HPI: Ryan [name] is a 77 y.o. male with medical history significant for hypertension, hypothyroidism, anxiety, and CAD with history of PCI in May 2018, now presenting to the emergency department with chest pain.  Patient reports developing a burning discomfort in his left chest a few days ago which she felt to be similar to his typical indigestion symptoms.  Since then, the pain has migrated to his central chest and has become more intense and constant.  He has taken PPI, Gaviscon, and Mylanta at home without relief.  The discomfort is worse when taking a deep breath, but he denies shortness of breath, cough, leg swelling, or leg pain.  He took aspirin and nitroglycerin at home before calling EMS who administered additional nitroglycerin.  He continued to have chest discomfort despite this.  ED Course: Upon arrival to the ED, patient is found to be afebrile and saturating mid 90s on room air with normal heart rate and stable blood pressure.  EKG demonstrates sinus rhythm.  Chest x-ray is negative for acute cardiopulmonary disease.  Labs are notable for WBC 14,000 and normal troponin x 2.  Patient was treated with GI cocktail, morphine, and Zofran in the ED.  Cardiology (Dr. Welton Flakes) was consulted by the ED PA and recommended medical admission.  Review of Systems:  All other systems reviewed and apart from HPI, are negative.  Past Medical History:  Diagnosis Date   Allergy    seasonal   Anxiety    Arthritis    Asthma    Cataract    Coronary artery disease 12/08/2016   STEMI, DES Circumflex   GERD (gastroesophageal reflux disease)    Hyperlipidemia    Hypothyroidism    STEMI (ST elevation myocardial infarction) (HCC) 12/08/2016    Past Surgical History:  Procedure Laterality Date   cataract Bilateral  03/2016   COLONOSCOPY  05/31/2004   COLONOSCOPY  06/30/2018   epidural steroid shot     x 3 , lumbar spine area   EYE SURGERY     bilateral cataracts   LEFT HEART CATH AND CORONARY ANGIOGRAPHY N/A 12/08/2016   Procedure: Left Heart Cath and Coronary Angiography;  Surgeon: Tonny Bollman, MD;  Location: [facility] INVASIVE CV LAB;  Service: Cardiovascular;  Laterality: N/A;   LUMBAR LAMINECTOMY/DECOMPRESSION MICRODISCECTOMY Bilateral 01/21/2022   Procedure: Lumbar Two-Three, Lumbar Three-Four/Lumbar Four-Five LAMINOTOMY/FORAMINOTOMY;  Surgeon: Tressie Stalker, MD;  Location: [facility] OR;  Service: Neurosurgery;  Laterality: Bilateral;  3C   POLYPECTOMY     SHOULDER SURGERY Right    Rotator Cuff Tear Repair   TONSILLECTOMY     UPPER GASTROINTESTINAL ENDOSCOPY      Social History:   reports that he has never smoked. He has never used smokeless tobacco. He reports current alcohol use of about 3.0 standard drinks of alcohol per week. He reports that he does not use drugs.  Allergies  Allergen Reactions   Contrast Media [Iodinated Contrast Media] Shortness Of Breath and Swelling    Swelling of the throat    Erythromycin Nausea And Vomiting   Niacin And Related Swelling   Penicillins Hives    Has patient had a PCN reaction causing immediate rash, facial/tongue/throat swelling, SOB or lightheadedness with hypotension: Y Has patient had a PCN reaction causing severe rash involving mucus membranes or skin necrosis: No Has  patient had a PCN reaction that required hospitalization: No Has patient had a PCN reaction occurring within the last 10 years: No If all of the above answers are "NO", then may proceed with Cephalosporin use.   Gabapentin Swelling    Severe swelling in legs    Family History  Problem Relation Age of Onset   Diabetes Mother    Heart failure Mother    Irritable bowel syndrome Mother    Diverticulosis Mother    Lung cancer Father        worked in [employer] trader yard   Heart disease  Sister    Irritable bowel syndrome Sister    Diverticulosis Sister    Colon cancer Neg Hx    Esophageal cancer Neg Hx    Pancreatic cancer Neg Hx    Stomach cancer Neg Hx      Prior to Admission medications   Medication Sig Start Date End Date Taking? Authorizing Provider  albuterol (VENTOLIN HFA) 108 (90 Base) MCG/ACT inhaler Inhale 2 puffs into the lungs every 4 (four) hours as needed for wheezing. 05/03/21   Jarold Motto, PA  aspirin EC 81 MG EC tablet Take 1 tablet (81 mg total) by mouth daily. 12/11/16   Marlon Pel, PA-C  atorvastatin (LIPITOR) 80 MG tablet Take 1 tablet (80 mg total) by mouth daily. 03/13/22   Tonny Bollman, MD  bisacodyl (DULCOLAX) 5 MG EC tablet Take 5 mg by mouth daily as needed for moderate constipation.    [provider]  Coenzyme Q10 200 MG capsule Take 200 mg by mouth daily.    [provider]  cyclobenzaprine (FLEXERIL) 5 MG tablet Take 1 tablet (5 mg total) by mouth 3 (three) times daily as needed for muscle spasms. 01/22/22   Tressie Stalker, MD  escitalopram (LEXAPRO) 5 MG tablet Take 1 tablet (5 mg total) by mouth daily. 09/05/22   Dulce Sellar, NP  fluorouracil (EFUDEX) 5 % cream Apply 1 application. topically daily as needed (skin cancer). 08/18/18   [provider]  nitroGLYCERIN (NITROSTAT) 0.4 MG SL tablet Place 1 tablet (0.4 mg total) under the tongue every 5 (five) minutes as needed for chest pain. 03/13/22   Tonny Bollman, MD  pantoprazole (PROTONIX) 20 MG tablet TAKE 1 TABLET BY MOUTH EVERY DAY 09/16/22   Dulce Sellar, NP  polyethylene glycol powder (GLYCOLAX/MIRALAX) powder Take 17 g by mouth as needed for mild constipation.     [provider]  SYNTHROID 88 MCG tablet TAKE 1 TABLET BY MOUTH EVERY DAY 11/20/22   Dulce Sellar, NP  tamsulosin (FLOMAX) 0.4 MG CAPS capsule TAKE 2 CAPSULES BY MOUTH EVERY DAY 11/27/22   Dulce Sellar, NP    Physical Exam: Vitals:   12/21/22 2211 12/21/22  2212 12/22/22 0145 12/22/22 0215  BP: (!) 140/67  137/75 (!) 142/76  Pulse: 64  61 64  Resp:  14 19 18   Temp: 97.6 F (36.4 C)     TempSrc: Oral     SpO2: 95%  95% 96%    Constitutional: NAD, no pallor or diaphoresis  Eyes: PERTLA, lids and conjunctivae normal ENMT: Mucous membranes are moist. Posterior pharynx clear of any exudate or lesions.   Neck: supple, no masses  Respiratory: no wheezing, no crackles. No accessory muscle use.  Cardiovascular: S1 & S2 heard, regular rate and rhythm. No extremity edema.  Abdomen: No distension, no tenderness, soft. Bowel sounds active.  Musculoskeletal: no clubbing / cyanosis. No joint deformity upper and lower extremities.  Skin: no significant rashes, lesions, ulcers. Warm, dry, well-perfused. Neurologic: CN 2-12 grossly intact. Moving all extremities. Alert and oriented.  Psychiatric: Calm. Cooperative.    Labs and Imaging on Admission: I have personally reviewed following labs and imaging studies  CBC: Recent Labs  Lab 12/21/22 2213  WBC 14.0*  HGB 13.9  HCT 41.9  MCV 94.6  PLT 243   Basic Metabolic Panel: Recent Labs  Lab 12/21/22 2213  NA 137  K 3.7  CL 104  CO2 24  GLUCOSE 122*  BUN 20  CREATININE 0.89  CALCIUM 8.9   GFR: CrCl cannot be calculated (Unknown ideal weight.). Liver Function Tests: No results for input(s): "AST", "ALT", "ALKPHOS", "BILITOT", "PROT", "ALBUMIN" in the last 168 hours. No results for input(s): "LIPASE", "AMYLASE" in the last 168 hours. No results for input(s): "AMMONIA" in the last 168 hours. Coagulation Profile: No results for input(s): "INR", "PROTIME" in the last 168 hours. Cardiac Enzymes: No results for input(s): "CKTOTAL", "CKMB", "CKMBINDEX", "TROPONINI" in the last 168 hours. BNP (last 3 results) No results for input(s): "PROBNP" in the last 8760 hours. HbA1C: No results for input(s): "HGBA1C" in the last 72 hours. CBG: No results for input(s): "GLUCAP" in the last 168  hours. Lipid Profile: No results for input(s): "CHOL", "HDL", "LDLCALC", "TRIG", "CHOLHDL", "LDLDIRECT" in the last 72 hours. Thyroid Function Tests: No results for input(s): "TSH", "T4TOTAL", "FREET4", "T3FREE", "THYROIDAB" in the last 72 hours. Anemia Panel: No results for input(s): "VITAMINB12", "FOLATE", "FERRITIN", "TIBC", "IRON", "RETICCTPCT" in the last 72 hours. Urine analysis:    Component Value Date/Time   COLORURINE STRAW (A) 01/27/2022 2141   APPEARANCEUR CLEAR 01/27/2022 2141   LABSPEC 1.010 01/27/2022 2141   PHURINE 6.0 01/27/2022 2141   GLUCOSEU NEGATIVE 01/27/2022 2141   GLUCOSEU NEGATIVE 02/14/2009 0730   HGBUR NEGATIVE 01/27/2022 2141   HGBUR negative 01/24/2010 0000   BILIRUBINUR NEGATIVE 01/27/2022 2141   BILIRUBINUR N 09/03/2017 1118   KETONESUR NEGATIVE 01/27/2022 2141   PROTEINUR NEGATIVE 01/27/2022 2141   UROBILINOGEN 0.2 09/03/2017 1118   UROBILINOGEN 0.2 01/24/2010 0000   NITRITE NEGATIVE 01/27/2022 2141   LEUKOCYTESUR NEGATIVE 01/27/2022 2141   Sepsis Labs: @LABRCNTIP (procalcitonin:4,lacticidven:4) )No results found for this or any previous visit (from the past 240 hour(s)).   Radiological Exams on Admission: DG Chest Port 1 View  Result Date: 12/21/2022 CLINICAL DATA:  Chest pain EXAM: PORTABLE CHEST 1 VIEW COMPARISON:  03/05/2017 FINDINGS: Single frontal view of the chest demonstrates an unremarkable cardiac silhouette. No acute airspace disease, effusion, or pneumothorax. No acute bony abnormalities. IMPRESSION: 1. No acute intrathoracic process. Electronically Signed   By: Sharlet Salina M.D.   On: 12/21/2022 23:39    EKG: Independently reviewed. Sinus rhythm.   Assessment/Plan   1. Chest pain; CAD  - Hx of CAD with DES to left circumflex in 2018, presenting with constant central chest discomfort, worse when taking a deep breath   - Troponin normal x2 in ED and no acute findings on CXR or EKG - Cardiology fellow recommended medical admission    - Check d-dimer, continue cardiac monitoring, continue ASA and Lipitor    2. Leukocytosis  - WBC is 14,000 on admission without fever or infectious signs/symptoms   - Repeat CBC in am    3. Hypothyroidism  - Continue Synthroid    4. Anxiety  - Continue Lexapro    DVT prophylaxis: Lovenox  Code Status: Full  Level of Care: Level of care: Telemetry Cardiac Family Communication: none present  Disposition Plan:  Patient is from: home  Anticipated d/c is to: Home  Anticipated d/c date is: 5/19 or 12/23/22  Patient currently: Pending pain-control, D-dimer  Consults called: None  Admission status: Observation     Briscoe Deutscher, MD [facility]  12/22/2022, 2:30 AM

## 2022-12-22 NOTE — Progress Notes (Signed)
Rounding Note    Patient Name: Ryan [name] Date of Encounter: 12/22/2022  [facility] Cardiologist: Tonny Bollman, MD   Subjective   3/10 CP at baseline, up to 5/10 w/ sitting up, afraid to eat because he believes that will make it worse.  Inpatient Medications    Scheduled Meds:  aspirin EC  81 mg Oral Daily   atorvastatin  80 mg Oral Daily   enoxaparin (LOVENOX) injection  40 mg Subcutaneous Q24H   escitalopram  5 mg Oral Daily   levothyroxine  88 mcg Oral Daily   pantoprazole  20 mg Oral Daily   tamsulosin  0.8 mg Oral Daily   Continuous Infusions:  PRN Meds: acetaminophen, ondansetron (ZOFRAN) IV, oxyCODONE   Vital Signs    Vitals:   12/22/22 0630 12/22/22 0700 12/22/22 0745 12/22/22 1000  BP: 127/67 130/72  (!) 140/90  Pulse: 70 69  79  Resp: (!) 21 16  (!) 23  Temp:   98.2 F (36.8 C)   TempSrc:   Oral   SpO2: 96% 90%  96%    Intake/Output Summary (Last 24 hours) at 12/22/2022 1015 Last data filed at 12/22/2022 0750 Gross per 24 hour  Intake --  Output 300 ml  Net -300 ml      12/04/2022    9:06 AM 09/05/2022    8:41 AM 08/16/2022    4:28 PM  Last 3 Weights  Weight (lbs) 217 lb 12.8 oz 218 lb 2 oz 221 lb 4 oz  Weight (kg) 98.793 kg 98.941 kg 100.358 kg      Telemetry    SR - Personally Reviewed  ECG    Artifact, but says CFX infarct, acute - Personally Reviewed  Physical Exam   GEN: No acute distress.   Neck: No JVD Cardiac: RRR, no murmurs, rubs, or gallops.  Respiratory: Clear to auscultation bilaterally, no chest wall tenderness GI: Soft, nontender, non-distended  MS: No edema; No deformity. Neuro:  Nonfocal  Psych: Normal affect   Labs    High Sensitivity Troponin:   Recent Labs  Lab 12/21/22 2213 12/22/22 0040  TROPONINIHS 5 6     Chemistry Recent Labs  Lab 12/21/22 2213 12/22/22 0255  NA 137 134*  K 3.7 4.1  CL 104 101  CO2 24 26  GLUCOSE 122* 123*  BUN 20 17  CREATININE 0.89 0.92  CALCIUM 8.9  8.7*  PROT  --  6.2*  ALBUMIN  --  3.6  AST  --  22  ALT  --  25  ALKPHOS  --  91  BILITOT  --  1.1  GFRNONAA >60 >60  ANIONGAP 9 7    Lipids  Lab Results  Component Value Date   CHOL 116 09/05/2022   HDL 41.10 09/05/2022   LDLCALC 62 09/05/2022   LDLDIRECT 144.0 05/13/2013   TRIG 68.0 09/05/2022   CHOLHDL 3 09/05/2022     Hematology Recent Labs  Lab 12/21/22 2213 12/22/22 0255  WBC 14.0* 13.2*  RBC 4.43 4.50  HGB 13.9 14.0  HCT 41.9 42.2  MCV 94.6 93.8  MCH 31.4 31.1  MCHC 33.2 33.2  RDW 12.8 13.0  PLT 243 248   Thyroid No results for input(s): "TSH", "FREET4" in the last 168 hours.  BNPNo results for input(s): "BNP", "PROBNP" in the last 168 hours.  DDimer  Recent Labs  Lab 12/22/22 0255  DDIMER 0.49    Lab Results  Component Value Date   ESRSEDRATE 20 (H)  12/22/2022    Lab Results  Component Value Date   CRP 0.9 12/22/2022     Radiology    DG Chest Port 1 View  Result Date: 12/21/2022 CLINICAL DATA:  Chest pain EXAM: PORTABLE CHEST 1 VIEW COMPARISON:  03/05/2017 FINDINGS: Single frontal view of the chest demonstrates an unremarkable cardiac silhouette. No acute airspace disease, effusion, or pneumothorax. No acute bony abnormalities. IMPRESSION: 1. No acute intrathoracic process. Electronically Signed   By: Sharlet Salina M.D.   On: 12/21/2022 23:39    Cardiac Studies   ECHO: ordered  Patient Profile     77 y.o. male  with a hx of hypertension, hypothyroidism, anxiety, and CAD with history of PCI in May 2018 was seen 12/22/2022 for the evaluation of chest pain at the request of Dr. Antionette Char   Assessment & Plan    Chest pain - no sig elevation in inflammatory markers, no improvement w/ GI cocktail - has not had nitro - oxycodone has done the most good - ez neg MI, ECG not acute - discuss w/ MD if ok to d/c from a cardiac standpoint, f/u with [facility]   [facility] will sign off.   Medication Recommendations:  per MD Other  recommendations (labs, testing, etc):   Follow up as an outpatient:  We will arrange  For questions or updates, please contact [facility] Please consult [email or web address] for contact info under        Signed, Theodore Demark, PA-C  12/22/2022, 10:15 AM

## 2022-12-22 NOTE — Assessment & Plan Note (Signed)
Continue Lexapro

## 2022-12-22 NOTE — Hospital Course (Addendum)
Mr. Bocook is a 77 y.o. M with CAD s/p PCI 2018, hypothyroidism who presented with non-exertional left sided burning pleuritic chest pain for 1 day.

## 2022-12-22 NOTE — Assessment & Plan Note (Signed)
TSH normal 3 mo ago. - Continue levothyroxine

## 2022-12-22 NOTE — Assessment & Plan Note (Signed)
D-dimer negative, ECG and troponins negative.  CXR clear.  ESR and CRP normal.  Observed overnight and Cardiology consulted for CVD risk stratification.

## 2022-12-22 NOTE — Discharge Summary (Signed)
Physician Discharge Summary   Patient: Ryan [name] MRN: [id number] DOB: Jan 30, 1946  Admit date:     12/21/2022  Discharge date: 12/22/22  Discharge Physician: Alberteen Sam   PCP: Dulce Sellar, NP     Recommendations at discharge:  Follow up with PCP Dulce Sellar for non-cardiac chest pain Follow up with Cardiology as needed     Discharge Diagnoses: Principal Problem:   Noncardiac chest pain Active Problems:   Hypothyroidism   Anxiety   CAD (coronary artery disease)      Hospital Course: Ryan [name] is a 77 y.o. M with CAD s/p PCI 2018, hypothyroidism who presented with non-exertional left sided burning pleuritic chest pain for 1 day.  Pain non-exertional, started after eating, worse with lying flat.     * Acute chest pain D-dimer negative, ECG and troponins negative.  CXR clear.  CRP normal, ESR minimal elevation.  Observed overnight and Cardiology consulted for CVD risk stratification.  They recommended no further testing, outpatient follow up.  Cardiology suspected MSK pain; given his hiatal hernia, association with food, and lying flat, I suspect it is esophagitis related.  Recommend therapeutic/diagnostic trial of more intense heartburn treatment and PCP follow up in 1 week.   CAD (coronary artery disease) On aspirin, atorvastatin  Situational anxiety On Lexapro  Hypothyroidism TSH normal 3 mo ago. On levothyroxine            The [facility] Controlled Substances Registry was reviewed for this patient prior to discharge.  Consultants: Cardiology, Dr. Scharlene Gloss Procedures performed: CXR  Disposition: Home Diet recommendation:  Discharge Diet Orders (From admission, onward)     Start     Ordered   12/22/22 0000  Diet - low sodium heart healthy        12/22/22 1123           Cardiac diet  DISCHARGE MEDICATION: Allergies as of 12/22/2022       Reactions   Contrast Media [iodinated Contrast Media] Shortness Of  Breath, Swelling   Swelling of the throat    Erythromycin Nausea And Vomiting   Niacin And Related Swelling   Penicillins Hives   Has patient had a PCN reaction causing immediate rash, facial/tongue/throat swelling, SOB or lightheadedness with hypotension: Y Has patient had a PCN reaction causing severe rash involving mucus membranes or skin necrosis: No Has patient had a PCN reaction that required hospitalization: No Has patient had a PCN reaction occurring within the last 10 years: No If all of the above answers are "NO", then may proceed with Cephalosporin use.   Gabapentin Swelling   Severe swelling in legs        Medication List     TAKE these medications    albuterol 108 (90 Base) MCG/ACT inhaler Commonly known as: VENTOLIN HFA Inhale 2 puffs into the lungs every 4 (four) hours as needed for wheezing.   aspirin EC 81 MG tablet Take 1 tablet (81 mg total) by mouth daily.   atorvastatin 80 MG tablet Commonly known as: LIPITOR Take 1 tablet (80 mg total) by mouth daily.   bisacodyl 5 MG EC tablet Commonly known as: DULCOLAX Take 5 mg by mouth daily as needed for moderate constipation.   Coenzyme Q10 200 MG capsule Take 200 mg by mouth daily.   cyclobenzaprine 5 MG tablet Commonly known as: FLEXERIL Take 1 tablet (5 mg total) by mouth 3 (three) times daily as needed for muscle spasms.   escitalopram 5 MG tablet Commonly known  asJudye [name] Take 1 tablet (5 mg total) by mouth daily.   fluorouracil 5 % cream Commonly known as: EFUDEX Apply 1 application. topically daily as needed (skin cancer).   nitroGLYCERIN 0.4 MG SL tablet Commonly known as: NITROSTAT Place 1 tablet (0.4 mg total) under the tongue every 5 (five) minutes as needed for chest pain.   pantoprazole 20 MG tablet Commonly known as: PROTONIX TAKE 1 TABLET BY MOUTH EVERY DAY   polyethylene glycol powder 17 GM/SCOOP powder Commonly known as: GLYCOLAX/MIRALAX Take 17 g by mouth as needed for mild  constipation.   Synthroid 88 MCG tablet Generic drug: levothyroxine TAKE 1 TABLET BY MOUTH EVERY DAY   tamsulosin 0.4 MG Caps capsule Commonly known as: FLOMAX TAKE 2 CAPSULES BY MOUTH EVERY DAY        Follow-up Information     Dulce Sellar, NP. Schedule an appointment as soon as possible for a visit in 1 week(s).   Specialty: Family Medicine Contact information: [address] [place] [address] [phone]                 Discharge Instructions     Diet - low sodium heart healthy   Complete by: As directed    Discharge instructions   Complete by: As directed    **IMPORTANT DISCHARGE INSTRUCTIONS**   From Dr. Maryfrances Bunnell: You were evaluated for chest pain  Here, you had an EKG and cardiac enzymes that were normal.  You had a D-dimer that ruled out blood clots.  You had a chest x-ray that showed no disease  Your pain may be from heart burn and your hiatal hernia (in which case increasing your omeprazole back to twice daily or adding famotidine/Pepcid, an acid-suppressing medicine that works differently from omeprazole) would make this better And also making changes like eating small meals, chewing food adequately, limiting alcohol, coffee and mint, avoiding dry meats or bread (those without sauce or gravy, and certainly not large bites of either) and most importantly, drinking plenty of fluids and not eating within 4 hours of bed   If this is musculoskeletal (like a strained rib or muscle) then acetaminophen, rest or a hot pad would help  Follow up with Cardiology as directed  Resume all your home medicines without change  Go see your primary doctor in 1 week to make sure things are better   Increase activity slowly   Complete by: As directed        Discharge Exam: There were no vitals filed for this visit.  General: Pt is alert, awake, not in acute distress Cardiovascular: RRR, nl S1-S2, no murmurs appreciated.   No LE edema.  No pain to  palpation of chest.  No friction rub. Respiratory: Normal respiratory rate and rhythm.  CTAB without rales or wheezes. Abdominal: Abdomen soft and non-tender.  No distension or HSM.   Neuro/Psych: Strength symmetric in upper and lower extremities.  Judgment and insight appear normal .   Condition at discharge: good  The results of significant diagnostics from this hospitalization (including imaging, microbiology, ancillary and laboratory) are listed below for reference.   Imaging Studies: DG Chest Port 1 View  Result Date: 12/21/2022 CLINICAL DATA:  Chest pain EXAM: PORTABLE CHEST 1 VIEW COMPARISON:  03/05/2017 FINDINGS: Single frontal view of the chest demonstrates an unremarkable cardiac silhouette. No acute airspace disease, effusion, or pneumothorax. No acute bony abnormalities. IMPRESSION: 1. No acute intrathoracic process. Electronically Signed   By: Maxwell Caul.D.  On: 12/21/2022 23:39    Microbiology: Results for orders placed or performed during the hospital encounter of 01/14/22  Surgical pcr screen     Status: None   Collection Time: 01/14/22 10:21 AM   Specimen: Nasal Mucosa; Nasal Swab  Result Value Ref Range Status   MRSA, PCR NEGATIVE NEGATIVE Final   Staphylococcus aureus NEGATIVE NEGATIVE Final    Comment: (NOTE) The Xpert SA Assay (FDA approved for NASAL specimens in patients 46 years of age and older), is one component of a comprehensive surveillance program. It is not intended to diagnose infection nor to guide or monitor treatment. Performed at [facility], [address], [place], [place] [address]     Labs: CBC: Recent Labs  Lab 12/21/22 2213 12/22/22 0255  WBC 14.0* 13.2*  HGB 13.9 14.0  HCT 41.9 42.2  MCV 94.6 93.8  PLT 243 248   Basic Metabolic Panel: Recent Labs  Lab 12/21/22 2213 12/22/22 0255  NA 137 134*  K 3.7 4.1  CL 104 101  CO2 24 26  GLUCOSE 122* 123*  BUN 20 17  CREATININE 0.89 0.92  CALCIUM 8.9 8.7*   Liver  Function Tests: Recent Labs  Lab 12/22/22 0255  AST 22  ALT 25  ALKPHOS 91  BILITOT 1.1  PROT 6.2*  ALBUMIN 3.6   CBG: No results for input(s): "GLUCAP" in the last 168 hours.  Discharge time spent: approximately 35 minutes spent on discharge counseling, evaluation of patient on day of discharge, and coordination of discharge planning with nursing, social work, pharmacy and case management  Signed: Alberteen Sam, MD [facility] 12/22/2022

## 2022-12-23 ENCOUNTER — Telehealth: Payer: Self-pay

## 2022-12-23 NOTE — Transitions of Care (Post Inpatient/ED Visit) (Signed)
12/26/2022  Name: GARO TAMBLYN MRN: [id number] DOB: May 11, 1946  Today's TOC FU Call Status: Today's TOC FU Call Status:: Successful TOC FU Call Competed TOC FU Call Complete Date: 12/26/22  Transition Care Management Follow-up Telephone Call Date of Discharge: 12/23/22 Discharge Facility: [facility]) Type of Discharge: Emergency Department Reason for ED Visit: Cardiac Conditions Cardiac Conditions Diagnosis: Chest Pain Persisting How have you been since you were released from the hospital?: Better Any questions or concerns?: No  Items Reviewed: Did you receive and understand the discharge instructions provided?: Yes Medications obtained,verified, and reconciled?: Yes (Medications Reviewed) Any new allergies since your discharge?: No Dietary orders reviewed?: No Do you have support at home?: Yes People in Home: spouse  Medications Reviewed Today: Medications Reviewed Today     Reviewed by Annabell Sabal, CMA (Certified Medical Assistant) on 12/26/22 at 1431  Med List Status: <None>   Medication Order Taking? Sig Documenting Provider Last Dose Status Informant  albuterol (VENTOLIN HFA) 108 (90 Base) MCG/ACT inhaler [id number] Yes Inhale 2 puffs into the lungs every 4 (four) hours as needed for wheezing. Jarold Motto, [place] Taking Active Self  aspirin EC 81 MG EC tablet [id number] Yes Take 1 tablet (81 mg total) by mouth daily. Marlon Pel, PA-C Taking Active Self  atorvastatin (LIPITOR) 80 MG tablet [id number] Yes Take 1 tablet (80 mg total) by mouth daily. Tonny Bollman, MD Taking Active   bisacodyl (DULCOLAX) 5 MG EC tablet [id number] Yes Take 5 mg by mouth daily as needed for moderate constipation. [provider] Taking Active Self  Coenzyme Q10 200 MG capsule [id number] Yes Take 200 mg by mouth daily. [provider] Taking Active Self  cyclobenzaprine (FLEXERIL) 5 MG tablet [id number] Yes Take 1 tablet (5 mg total) by mouth 3 (three) times daily as  needed for muscle spasms. Tressie Stalker, MD Taking Active Self  escitalopram (LEXAPRO) 5 MG tablet [id number] Yes Take 1 tablet (5 mg total) by mouth daily. Dulce Sellar, NP Taking Active   fluorouracil (EFUDEX) 5 % cream [id number] Yes Apply 1 application. topically daily as needed (skin cancer). [provider] Taking Active Self  nitroGLYCERIN (NITROSTAT) 0.4 MG SL tablet [id number] Yes Place 1 tablet (0.4 mg total) under the tongue every 5 (five) minutes as needed for chest pain. Tonny Bollman, MD Taking Active   pantoprazole (PROTONIX) 20 MG tablet [id number] Yes TAKE 1 TABLET BY MOUTH EVERY DAY Hudnell, Stephanie, NP Taking Active   polyethylene glycol powder (GLYCOLAX/MIRALAX) powder [id number] Yes Take 17 g by mouth as needed for mild constipation.  [provider] Taking Active Self  SYNTHROID 88 MCG tablet [id number] Yes TAKE 1 TABLET BY MOUTH EVERY DAY Hudnell, Stephanie, NP Taking Active   tamsulosin (FLOMAX) 0.4 MG CAPS capsule [id number] Yes TAKE 2 CAPSULES BY MOUTH EVERY DAY Hudnell, Stephanie, NP Taking Active             Home Care and Equipment/Supplies: Were Home Health Services Ordered?: No Any new equipment or medical supplies ordered?: No  Functional Questionnaire: Do you need assistance with bathing/showering or dressing?: No Do you need assistance with meal preparation?: No Do you need assistance with eating?: No Do you have difficulty maintaining continence: No Do you need assistance with getting out of bed/getting out of a chair/moving?: No Do you have difficulty managing or taking your medications?: No  Follow up appointments reviewed: PCP Follow-up appointment confirmed?: No (patient declined an appointment at this time) MD Provider Line Number:[phone] Given: Yes [facility]  Follow-up appointment confirmed?: No Reason Specialist Follow-Up Not Confirmed: Patient has Specialist Provider Number and will Call for Appointment Do  you need transportation to your follow-up appointment?: No Do you understand care options if your condition(s) worsen?: Yes-patient verbalized understanding    SIGNATURE Raphel Stickles,CMA [facility]

## 2022-12-27 ENCOUNTER — Ambulatory Visit: Payer: Medicare Other | Attending: Physician Assistant | Admitting: Physician Assistant

## 2022-12-27 ENCOUNTER — Encounter: Payer: Self-pay | Admitting: Physician Assistant

## 2022-12-27 VITALS — BP 126/72 | HR 58 | Ht 72.0 in | Wt 215.6 lb

## 2022-12-27 DIAGNOSIS — I10 Essential (primary) hypertension: Secondary | ICD-10-CM

## 2022-12-27 DIAGNOSIS — I2510 Atherosclerotic heart disease of native coronary artery without angina pectoris: Secondary | ICD-10-CM

## 2022-12-27 DIAGNOSIS — E785 Hyperlipidemia, unspecified: Secondary | ICD-10-CM | POA: Diagnosis present

## 2022-12-27 NOTE — Telephone Encounter (Signed)
Noted and agreed, thank you. 

## 2022-12-27 NOTE — Progress Notes (Signed)
Office Visit    Patient Name: Ryan [name] Date of Encounter: 12/27/2022  PCP:  Dulce Sellar, NP   [facility]  Cardiologist:  Tonny Bollman, MD  Advanced Practice Provider:  No care team member to display Electrophysiologist:  None   HPI    Ryan [name] is a 77 y.o. male with a PMH of coronary artery disease presents today for appointment.  The patient initially presented in 2018 with STEMI involving the left circumflex and he was treated with primary PCI using a drug-eluting stent.  Done well since then with no recurrent angina or ischemic events.  Patient had been intolerant to beta-blockers due to bradycardia.  Lipids have been at goal on high intensity statin drug.  Last seen in our office a year prior (August 2023).  He was working with physical therapy at that time and regaining his strength and gait stability.  Denied chest pain/pressure, orthopnea, or PND.  No heart palpitations at that time.  Did experience some exertional dyspnea but he felt it was due to being out of shape and very inactive the past few years as well as getting some weight.  Today, he states that when he went to the hospital he was having central chest pain.  He took 4 nitroglycerin and 8 baby aspirin and it did not touch the pain.  He was given morphine in the ER and that also did not help.  Hydrocodone finally worked.  He has a very large hiatal hernia that he has had for years.  His troponins were negative.  They thought he may have pulled a muscle but ultimately was thought to be due to his hiatal hernia and GERD.  Has a slight amount of lower extremity swelling today.  No further chest pain.  Reports no shortness of breath nor dyspnea on exertion. No edema, orthopnea, PND. Reports no palpitations.    Past Medical History    Past Medical History:  Diagnosis Date   Allergy    seasonal   Anxiety    Arthritis    Asthma    Cataract    Coronary artery disease  12/08/2016   STEMI, DES Circumflex   GERD (gastroesophageal reflux disease)    Hyperlipidemia    Hypothyroidism    STEMI (ST elevation myocardial infarction) (HCC) 12/08/2016   Past Surgical History:  Procedure Laterality Date   cataract Bilateral 03/2016   COLONOSCOPY  05/31/2004   COLONOSCOPY  06/30/2018   epidural steroid shot     x 3 , lumbar spine area   EYE SURGERY     bilateral cataracts   LEFT HEART CATH AND CORONARY ANGIOGRAPHY N/A 12/08/2016   Procedure: Left Heart Cath and Coronary Angiography;  Surgeon: Tonny Bollman, MD;  Location: [facility] INVASIVE CV LAB;  Service: Cardiovascular;  Laterality: N/A;   LUMBAR LAMINECTOMY/DECOMPRESSION MICRODISCECTOMY Bilateral 01/21/2022   Procedure: Lumbar Two-Three, Lumbar Three-Four/Lumbar Four-Five LAMINOTOMY/FORAMINOTOMY;  Surgeon: Tressie Stalker, MD;  Location: [facility] OR;  Service: Neurosurgery;  Laterality: Bilateral;  3C   POLYPECTOMY     SHOULDER SURGERY Right    Rotator Cuff Tear Repair   TONSILLECTOMY     UPPER GASTROINTESTINAL ENDOSCOPY      Allergies  Allergies  Allergen Reactions   Contrast Media [Iodinated Contrast Media] Shortness Of Breath and Swelling    Swelling of the throat    Erythromycin Nausea And Vomiting   Niacin And Related Swelling   Penicillins Hives    Has patient had a PCN  reaction causing immediate rash, facial/tongue/throat swelling, SOB or lightheadedness with hypotension: Y Has patient had a PCN reaction causing severe rash involving mucus membranes or skin necrosis: No Has patient had a PCN reaction that required hospitalization: No Has patient had a PCN reaction occurring within the last 10 years: No If all of the above answers are "NO", then may proceed with Cephalosporin use.   Gabapentin Swelling    Severe swelling in legs     EKGs/Labs/Other Studies Reviewed:   The following studies were reviewed today: Cardiac Studies & Procedures   CARDIAC CATHETERIZATION  CARDIAC CATHETERIZATION  12/08/2016  Narrative 1. Severe thrombotic stenosis of the left circumflex treated successfully with Primary PCI using a 3.5x24 mm Promus DES 2. Moderate diffuse LAD, RCA, and ramus intermedius stenoses 3. Normal LV function  Recommend:  Aggressive medical therapy  The patient's residual CAD affects small caliber vessels and will be best managed medically  Aggrastat x 6 hours  DAPT with ASA and brilinta x 12 months without interruption  Anticipate discharge 48 hours if no complications  Findings Coronary Findings Diagnostic  Dominance: Right  Left Main Vessel is angiographically normal.  Left Anterior Descending Vessel is small.  Ramus Intermedius Vessel is small.  Left Circumflex The lesion is eccentric.  Right Coronary Artery  Intervention  Prox Cx lesion Angioplasty Lesion crossed with guidewire using a WIRE COUGAR XT ST. 190CM. Pre-stent angioplasty was performed using a BALLOON MOZEC 2.50X14. A STENT PROMUS PREM MR 3.5X24 drug eluting stent was successfully placed. Post-stent angioplasty was not performed. The pre-interventional distal flow is normal (TIMI 3).  The post-interventional distal flow is normal (TIMI 3). The intervention was successful . No complications occurred at this lesion. An EBU guide is used. Heparin and aggrastat are used for anticoagulation. A therapeutic ACT is achieved. The lesion is dilated with a 2.5 mm balloon, then stented with a 3.5x24 mm Promus DES. There is a 0% residual stenosis post intervention.   STRESS TESTS  EXERCISE TOLERANCE TEST (ETT) 06/20/2017  Narrative  Blood pressure demonstrated a hypertensive response to exercise.  Hypertensive response and mildly reduced exercise tolerance. Otherwise normal ECG stress test.               EKG:  EKG is not ordered today.    Recent Labs: 09/05/2022: TSH 4.45 12/22/2022: ALT 25; BUN 17; Creatinine, Ser 0.92; Hemoglobin 14.0; Platelets 248; Potassium 4.1; Sodium 134  Recent Lipid  Panel    Component Value Date/Time   CHOL 116 09/05/2022 0933   CHOL 89 (L) 01/21/2017 0904   TRIG 68.0 09/05/2022 0933   HDL 41.10 09/05/2022 0933   HDL 31 (L) 01/21/2017 0904   CHOLHDL 3 09/05/2022 0933   VLDL 13.6 09/05/2022 0933   LDLCALC 62 09/05/2022 0933   LDLCALC 62 03/28/2020 1055   LDLDIRECT 144.0 05/13/2013 1036     Home Medications   Current Meds  Medication Sig   albuterol (VENTOLIN HFA) 108 (90 Base) MCG/ACT inhaler Inhale 2 puffs into the lungs every 4 (four) hours as needed for wheezing.   aspirin EC 81 MG EC tablet Take 1 tablet (81 mg total) by mouth daily.   atorvastatin (LIPITOR) 80 MG tablet Take 1 tablet (80 mg total) by mouth daily.   bisacodyl (DULCOLAX) 5 MG EC tablet Take 5 mg by mouth daily as needed for moderate constipation.   Coenzyme Q10 200 MG capsule Take 200 mg by mouth daily.   cyclobenzaprine (FLEXERIL) 5 MG tablet Take 1 tablet (  5 mg total) by mouth 3 (three) times daily as needed for muscle spasms.   escitalopram (LEXAPRO) 5 MG tablet Take 1 tablet (5 mg total) by mouth daily.   fluorouracil (EFUDEX) 5 % cream Apply 1 application. topically daily as needed (skin cancer).   nitroGLYCERIN (NITROSTAT) 0.4 MG SL tablet Place 1 tablet (0.4 mg total) under the tongue every 5 (five) minutes as needed for chest pain.   pantoprazole (PROTONIX) 20 MG tablet TAKE 1 TABLET BY MOUTH EVERY DAY   polyethylene glycol powder (GLYCOLAX/MIRALAX) powder Take 17 g by mouth as needed for mild constipation.    SYNTHROID 88 MCG tablet TAKE 1 TABLET BY MOUTH EVERY DAY   tamsulosin (FLOMAX) 0.4 MG CAPS capsule TAKE 2 CAPSULES BY MOUTH EVERY DAY   traMADol (ULTRAM) 50 MG tablet Take 50 mg by mouth every 6 (six) hours as needed for moderate pain.     Review of Systems      All other systems reviewed and are otherwise negative except as noted above.  Physical Exam    VS:  BP 126/72   Pulse (!) 58   Ht 6' (1.829 m)   Wt 215 lb 9.6 oz (97.8 kg)   SpO2 96%   BMI  29.24 kg/m  , BMI Body mass index is 29.24 kg/m.  Wt Readings from Last 3 Encounters:  12/27/22 215 lb 9.6 oz (97.8 kg)  12/04/22 217 lb 12.8 oz (98.8 kg)  09/05/22 218 lb 2 oz (98.9 kg)     GEN: Well nourished, well developed, in no acute distress. HEENT: normal. Neck: Supple, no JVD, carotid bruits, or masses. Cardiac: RRR, no murmurs, rubs, or gallops. No clubbing, cyanosis, edema.  Radials/PT 2+ and equal bilaterally.  Respiratory:  Respirations regular and unlabored, clear to auscultation bilaterally. GI: Soft, nontender, nondistended. MS: No deformity or atrophy. Skin: Warm and dry, no rash. Neuro:  Strength and sensation are intact. Psych: Normal affect.  Assessment & Plan    Coronary artery disease -Continue current medications which include aspirin 81 mg daily, Lipitor 80 mg daily, nitro as needed -No recent chest pain -He sees the GI doctor next week for his hiatal hernia  Mixed hyperlipidemia -His LDL is 64 (2/24) -For now continue Lipitor 80 mg daily  Hypertension -Blood pressure well-controlled today, 126/72 -Continue current medications -Continue low-sodium diet -Continue to monitor at home an hour after morning medications       Disposition: Follow up 4-5 months with Tonny Bollman, MD or APP.  Signed, Sharlene Dory, PA-C 12/27/2022, 5:00 PM [facility]

## 2022-12-27 NOTE — Patient Instructions (Signed)
Medication Instructions:  Your physician recommends that you continue on your current medications as directed. Please refer to the Current Medication list given to you today.  *If you need a refill on your cardiac medications before your next appointment, please call your pharmacy*  Lab Work: None ordered If you have labs (blood work) drawn today and your tests are completely normal, you will receive your results only by: MyChart Message (if you have MyChart) OR A paper copy in the mail If you have any lab test that is abnormal or we need to change your treatment, we will call you to review the results.  Follow-Up: At [facility], you and your health needs are our priority.  As part of our continuing mission to provide you with exceptional heart care, we have created designated Provider Care Teams.  These Care Teams include your primary Cardiologist (physician) and Advanced Practice Providers (APPs -  Physician Assistants and Nurse Practitioners) who all work together to provide you with the care you need, when you need it.  Your next appointment:   4-5 month(s)  Provider:   Tonny Bollman, MD  or APP  Keep follow up with GI next week as scheduled.  Other Instructions 1.Dr Cliffton Asters is the physician that we discussed today. 2.Wear lower extremity compression.  Heart-Healthy Eating Plan Many factors influence your heart health, including eating and exercise habits. Heart health is also called coronary health. Coronary risk increases with abnormal blood fat (lipid) levels. A heart-healthy eating plan includes limiting unhealthy fats, increasing healthy fats, limiting salt (sodium) intake, and making other diet and lifestyle changes. What is my plan? Your health care provider may recommend that: You limit your fat intake to _________% or less of your total calories each day. You limit your saturated fat intake to _________% or less of your total calories each day. You limit the  amount of cholesterol in your diet to less than _________ mg per day. You limit the amount of sodium in your diet to less than _________ mg per day. What are tips for following this plan? Cooking Cook foods using methods other than frying. Baking, boiling, grilling, and broiling are all good options. Other ways to reduce fat include: Removing the skin from poultry. Removing all visible fats from meats. Steaming vegetables in water or broth. Meal planning  At meals, imagine dividing your plate into fourths: Fill one-half of your plate with vegetables and green salads. Fill one-fourth of your plate with whole grains. Fill one-fourth of your plate with lean protein foods. Eat 2-4 cups of vegetables per day. One cup of vegetables equals 1 cup (91 g) broccoli or cauliflower florets, 2 medium carrots, 1 large bell pepper, 1 large sweet potato, 1 large tomato, 1 medium white potato, 2 cups (150 g) raw leafy greens. Eat 1-2 cups of fruit per day. One cup of fruit equals 1 small apple, 1 large banana, 1 cup (237 g) mixed fruit, 1 large orange,  cup (82 g) dried fruit, 1 cup (240 mL) 100% fruit juice. Eat more foods that contain soluble fiber. Examples include apples, broccoli, carrots, beans, peas, and barley. Aim to get 25-30 g of fiber per day. Increase your consumption of legumes, nuts, and seeds to 4-5 servings per week. One serving of dried beans or legumes equals  cup (90 g) cooked, 1 serving of nuts is  oz (12 almonds, 24 pistachios, or 7 walnut halves), and 1 serving of seeds equals  oz (8 g). Fats Choose healthy  fats more often. Choose monounsaturated and polyunsaturated fats, such as olive and canola oils, avocado oil, flaxseeds, walnuts, almonds, and seeds. Eat more omega-3 fats. Choose salmon, mackerel, sardines, tuna, flaxseed oil, and ground flaxseeds. Aim to eat fish at least 2 times each week. Check food labels carefully to identify foods with trans fats or high amounts of  saturated fat. Limit saturated fats. These are found in animal products, such as meats, butter, and cream. Plant sources of saturated fats include palm oil, palm kernel oil, and coconut oil. Avoid foods with partially hydrogenated oils in them. These contain trans fats. Examples are stick margarine, some tub margarines, cookies, crackers, and other baked goods. Avoid fried foods. General information Eat more home-cooked food and less restaurant, buffet, and fast food. Limit or avoid alcohol. Limit foods that are high in added sugar and simple starches such as foods made using white refined flour (white breads, pastries, sweets). Lose weight if you are overweight. Losing just 5-10% of your body weight can help your overall health and prevent diseases such as diabetes and heart disease. Monitor your sodium intake, especially if you have high blood pressure. Talk with your health care provider about your sodium intake. Try to incorporate more vegetarian meals weekly. What foods should I eat? Fruits All fresh, canned (in natural juice), or frozen fruits. Vegetables Fresh or frozen vegetables (raw, steamed, roasted, or grilled). Green salads. Grains Most grains. Choose whole wheat and whole grains most of the time. Rice and pasta, including brown rice and pastas made with whole wheat. Meats and other proteins Lean, well-trimmed beef, veal, pork, and lamb. Chicken and [place] without skin. All fish and shellfish. Wild duck, rabbit, pheasant, and venison. Egg whites or low-cholesterol egg substitutes. Dried beans, peas, lentils, and tofu. Seeds and most nuts. Dairy Low-fat or nonfat cheeses, including ricotta and mozzarella. Skim or 1% milk (liquid, powdered, or evaporated). Buttermilk made with low-fat milk. Nonfat or low-fat yogurt. Fats and oils Non-hydrogenated (trans-free) margarines. Vegetable oils, including soybean, sesame, sunflower, olive, avocado, peanut, safflower, corn, canola, and  cottonseed. Salad dressings or mayonnaise made with a vegetable oil. Beverages Water (mineral or sparkling). Coffee and tea. Unsweetened ice tea. Diet beverages. Sweets and desserts Sherbet, gelatin, and fruit ice. Small amounts of dark chocolate. Limit all sweets and desserts. Seasonings and condiments All seasonings and condiments. The items listed above may not be a complete list of foods and beverages you can eat. Contact a dietitian for more options. What foods should I avoid? Fruits Canned fruit in heavy syrup. Fruit in cream or butter sauce. Fried fruit. Limit coconut. Vegetables Vegetables cooked in cheese, cream, or butter sauce. Fried vegetables. Grains Breads made with saturated or trans fats, oils, or whole milk. Croissants. Sweet rolls. Donuts. High-fat crackers, such as cheese crackers and chips. Meats and other proteins Fatty meats, such as hot dogs, ribs, sausage, bacon, rib-eye roast or steak. High-fat deli meats, such as salami and bologna. Caviar. Domestic duck and goose. Organ meats, such as liver. Dairy Cream, sour cream, cream cheese, and creamed cottage cheese. Whole-milk cheeses. Whole or 2% milk (liquid, evaporated, or condensed). Whole buttermilk. Cream sauce or high-fat cheese sauce. Whole-milk yogurt. Fats and oils Meat fat, or shortening. Cocoa butter, hydrogenated oils, palm oil, coconut oil, palm kernel oil. Solid fats and shortenings, including bacon fat, salt pork, lard, and butter. Nondairy cream substitutes. Salad dressings with cheese or sour cream. Beverages Regular sodas and any drinks with added sugar. Sweets and desserts Frosting. Pudding.  Cookies. Cakes. Pies. Milk chocolate or white chocolate. Buttered syrups. Full-fat ice cream or ice cream drinks. The items listed above may not be a complete list of foods and beverages to avoid. Contact a dietitian for more information. Summary Heart-healthy meal planning includes limiting unhealthy fats,  increasing healthy fats, limiting salt (sodium) intake and making other diet and lifestyle changes. Lose weight if you are overweight. Losing just 5-10% of your body weight can help your overall health and prevent diseases such as diabetes and heart disease. Focus on eating a balance of foods, including fruits and vegetables, low-fat or nonfat dairy, lean protein, nuts and legumes, whole grains, and heart-healthy oils and fats. This information is not intended to replace advice given to you by your health care provider. Make sure you discuss any questions you have with your health care provider. Document Revised: 08/27/2021 Document Reviewed: 08/27/2021 Elsevier Patient Education  2024 Elsevier Inc.    Low-Sodium Eating Plan Salt (sodium) helps you keep a healthy balance of fluids in your body. Too much sodium can raise your blood pressure. It can also cause fluid and waste to be held in your body. Your health care provider or dietitian may recommend a low-sodium eating plan if you have high blood pressure (hypertension), kidney disease, liver disease, or heart failure. Eating less sodium can help lower your blood pressure and reduce swelling. It can also protect your heart, liver, and kidneys. What are tips for following this plan? Reading food labels  Check food labels for the amount of sodium per serving. If you eat more than one serving, you must multiply the listed amount by the number of servings. Choose foods with less than 140 milligrams (mg) of sodium per serving. Avoid foods with 300 mg of sodium or more per serving. Always check how much sodium is in a product, even if the label says "unsalted" or "no salt added." Shopping  Buy products labeled as "low-sodium" or "no salt added." Buy fresh foods. Avoid canned foods and pre-made or frozen meals. Avoid canned, cured, or processed meats. Buy breads that have less than 80 mg of sodium per slice. Cooking  Eat more home-cooked food.  Try to eat less restaurant, buffet, and fast food. Try not to add salt when you cook. Use salt-free seasonings or herbs instead of table salt or sea salt. Check with your provider or pharmacist before using salt substitutes. Cook with plant-based oils, such as canola, sunflower, or olive oil. Meal planning When eating at a restaurant, ask if your food can be made with less salt or no salt. Avoid dishes labeled as brined, pickled, cured, or smoked. Avoid dishes made with soy sauce, miso, or teriyaki sauce. Avoid foods that have monosodium glutamate (MSG) in them. MSG may be added to some restaurant food, sauces, soups, bouillon, and canned foods. Make meals that can be grilled, baked, poached, roasted, or steamed. These are often made with less sodium. General information Try to limit your sodium intake to 1,500-2,300 mg each day, or the amount told by your provider. What foods should I eat? Fruits Fresh, frozen, or canned fruit. Fruit juice. Vegetables Fresh or frozen vegetables. "No salt added" canned vegetables. "No salt added" tomato sauce and paste. Low-sodium or reduced-sodium tomato and vegetable juice. Grains Low-sodium cereals, such as oats, puffed wheat and rice, and shredded wheat. Low-sodium crackers. Unsalted rice. Unsalted pasta. Low-sodium bread. Whole grain breads and whole grain pasta. Meats and other proteins Fresh or frozen meat, poultry, seafood, and fish.  These should have no added salt. Low-sodium canned tuna and salmon. Unsalted nuts. Dried peas, beans, and lentils without added salt. Unsalted canned beans. Eggs. Unsalted nut butters. Dairy Milk. Soy milk. Cheese that is naturally low in sodium, such as ricotta cheese, fresh mozzarella, or Swiss cheese. Low-sodium or reduced-sodium cheese. Cream cheese. Yogurt. Seasonings and condiments Fresh and dried herbs and spices. Salt-free seasonings. Low-sodium mustard and ketchup. Sodium-free salad dressing. Sodium-free light  mayonnaise. Fresh or refrigerated horseradish. Lemon juice. Vinegar. Other foods Homemade, reduced-sodium, or low-sodium soups. Unsalted popcorn and pretzels. Low-salt or salt-free chips. The items listed above may not be all the foods and drinks you can have. Talk to a dietitian to learn more. What foods should I avoid? Vegetables Sauerkraut, pickled vegetables, and relishes. Olives. [place] fries. Onion rings. Regular canned vegetables, except low-sodium or reduced-sodium items. Regular canned tomato sauce and paste. Regular tomato and vegetable juice. Frozen vegetables in sauces. Grains Instant hot cereals. Bread stuffing, pancake, and biscuit mixes. Croutons. Seasoned rice or pasta mixes. Noodle soup cups. Boxed or frozen macaroni and cheese. Regular salted crackers. Self-rising flour. Meats and other proteins Meat or fish that is salted, canned, smoked, spiced, or pickled. Precooked or cured meat, such as sausages or meat loaves. Tomasa Blase. Ham. Pepperoni. Hot dogs. Corned beef. Chipped beef. Salt pork. Jerky. Pickled herring, anchovies, and sardines. Regular canned tuna. Salted nuts. Dairy Processed cheese and cheese spreads. Hard cheeses. Cheese curds. Blue cheese. Feta cheese. String cheese. Regular cottage cheese. Buttermilk. Canned milk. Fats and oils Salted butter. Regular margarine. Ghee. Bacon fat. Seasonings and condiments Onion salt, garlic salt, seasoned salt, table salt, and sea salt. Canned and packaged gravies. Worcestershire sauce. Tartar sauce. Barbecue sauce. Teriyaki sauce. Soy sauce, including reduced-sodium soy sauce. Steak sauce. Fish sauce. Oyster sauce. Cocktail sauce. Horseradish that you find on the shelf. Regular ketchup and mustard. Meat flavorings and tenderizers. Bouillon cubes. Hot sauce. Pre-made or packaged marinades. Pre-made or packaged taco seasonings. Relishes. Regular salad dressings. Salsa. Other foods Salted popcorn and pretzels. Corn chips and puffs. Potato  and tortilla chips. Canned or dried soups. Pizza. Frozen entrees and pot pies. The items listed above may not be all the foods and drinks you should avoid. Talk to a dietitian to learn more. This information is not intended to replace advice given to you by your health care provider. Make sure you discuss any questions you have with your health care provider. Document Revised: 08/08/2022 Document Reviewed: 08/08/2022 Elsevier Patient Education  2024 [employer].

## 2023-01-01 ENCOUNTER — Ambulatory Visit: Payer: Medicare Other | Admitting: Physician Assistant

## 2023-01-03 NOTE — Progress Notes (Unsigned)
01/06/2023 Ryan [name] [id number] [birth year]  Referring provider: Dulce Sellar, NP Primary GI doctor: Dr. Myrtie Neither  ASSESSMENT AND PLAN:   Gastroesophageal reflux disease with atypical chest pain Was getting off protonix and GERD has been worse, has increased back to BID and continues to have GERD Has been having non exertional chest pain, worse after eating, no melena, no dysphagia Lifestyle changes discussed, avoid NSAIDS, ETOH Will schedule for EGD at [facility]. I discussed risks of EGD with patient today, including risk of sedation, bleeding or perforation.  Patient provides understanding and gave verbal consent to proceed. Will consider barium swallow if unremarkable.   Coronary artery disease involving native coronary artery of native heart without angina pectoris Negative troponin No exertional chest pain/SOB Has seen cardiology   Patient Care Team: Dulce Sellar, NP as PCP - General (Family Medicine) Tonny Bollman, MD as PCP - Cardiology (Cardiology) Erroll Luna, [facility] as Pharmacist (Pharmacist)  HISTORY OF PRESENT ILLNESS: 77 y.o. male with a past medical history of CAD status post DES 2018, personal history of adenomatous polyps, GERD and others listed below presents for evaluation of GERD.   2018 office visit with Dr. Myrtie Neither for reflux and history of colon polyps. 06/30/2018 colonoscopy and upper endoscopy with Dr. Myrtie Neither for reflux and personal history of colon polyps Endoscopy unremarkable,  Inadequate prep 2 diminutive polyps 08/25/2019 colonoscopy good bowel prep with 2 days Suprep/MiraLAX with 3 polyps 2 to 8 mm size, 5 mm polyp, redundant colon, internal hemorrhoids Pathology showed tubular adenomatous polyps, no recommended future surveillance secondary to age 43/25/2023 CT and pelvis without contrast for abdominal pain showed normal liver, normal gallbladder, normal pancreas scattered fecal matter throughout colon, no inflammatory obstructive  changes, normal stomach and small bowel  States in the last 2 weeks he was having very bad chest pain, substernal chest pain, tried mylanta, gavescon without help.  Called 911 and went to ER 05/18. Tried morphine, GI cocktail without help. Hydrocodone helped. Negative troponins, EKG normal.  He was having GERD for last 2 months,  He states he can be drinking water and occ feels his esophagus will close up on him. No odynphagia, no dysphagia. He will have lots of beltching. No melena, no AB pain.  He had stopped his protonix gradually taking it every other day and then every 3rd day.  He has increase back up to protonix 40 mg BID and states his GERD is much better and no more chest pain.  He will pick up air tanks and will occ feel chest discomfort.  He is not on NSAIDS, on tramadol.  He does drink wine 3 x a week, just white wine, red whine causes issues.  He has history of constipation, BM every other day, occ can be every 2-3 days. He is on metamucil only, nothing else. Was in ER 01/2022 with constipation.   12/22/2022 WBC 13.2, Hgb without anemia at 14, normal platelets, sodium 134, protein 6.2 Sister has stage 4 lung cancer and moved into the house.  He denies blood thinner use.  He  tobacco use.  He  drug use.    He  reports that he has never smoked. He has never used smokeless tobacco. He reports current alcohol use of about 3.0 standard drinks of alcohol per week. He reports that he does not use drugs.  RELEVANT LABS AND IMAGING: CBC    Component Value Date/Time   WBC 13.2 (H) 12/22/2022 0255   RBC 4.50 12/22/2022 0255  HGB 14.0 12/22/2022 0255   HCT 42.2 12/22/2022 0255   PLT 248 12/22/2022 0255   MCV 93.8 12/22/2022 0255   MCH 31.1 12/22/2022 0255   MCHC 33.2 12/22/2022 0255   RDW 13.0 12/22/2022 0255   LYMPHSABS 1.1 08/28/2016 0937   MONOABS 0.4 08/28/2016 0937   EOSABS 0.1 08/28/2016 0937   BASOSABS 0.0 08/28/2016 0937   Recent Labs    01/14/22 1037  01/27/22 2125 12/21/22 2213 12/22/22 0255  HGB 14.4 13.5 13.9 14.0    CMP     Component Value Date/Time   NA 134 (L) 12/22/2022 0255   K 4.1 12/22/2022 0255   CL 101 12/22/2022 0255   CO2 26 12/22/2022 0255   GLUCOSE 123 (H) 12/22/2022 0255   BUN 17 12/22/2022 0255   CREATININE 0.92 12/22/2022 0255   CREATININE 0.90 03/28/2020 1055   CALCIUM 8.7 (L) 12/22/2022 0255   PROT 6.2 (L) 12/22/2022 0255   PROT 6.3 01/21/2017 0904   ALBUMIN 3.6 12/22/2022 0255   ALBUMIN 4.1 01/21/2017 0904   AST 22 12/22/2022 0255   ALT 25 12/22/2022 0255   ALKPHOS 91 12/22/2022 0255   BILITOT 1.1 12/22/2022 0255   BILITOT 0.8 01/21/2017 0904   GFRNONAA >60 12/22/2022 0255   GFRAA >60 03/05/2017 1044      Latest Ref Rng & Units 12/22/2022    2:55 AM 09/05/2022    9:33 AM 01/27/2022    9:25 PM  Hepatic Function  Total Protein 6.5 - 8.1 g/dL 6.2  5.8  6.6   Albumin 3.5 - 5.0 g/dL 3.6  3.9  3.7   AST 15 - 41 U/L 22  16  25    ALT 0 - 44 U/L 25  28  34   Alk Phosphatase 38 - 126 U/L 91  101  74   Total Bilirubin 0.3 - 1.2 mg/dL 1.1  0.7  0.7       Current Medications:   Current Outpatient Medications (Endocrine & Metabolic):    SYNTHROID 88 MCG tablet, TAKE 1 TABLET BY MOUTH EVERY DAY  Current Outpatient Medications (Cardiovascular):    atorvastatin (LIPITOR) 80 MG tablet, Take 1 tablet (80 mg total) by mouth daily.   nitroGLYCERIN (NITROSTAT) 0.4 MG SL tablet, Place 1 tablet (0.4 mg total) under the tongue every 5 (five) minutes as needed for chest pain.  Current Outpatient Medications (Respiratory):    albuterol (VENTOLIN HFA) 108 (90 Base) MCG/ACT inhaler, Inhale 2 puffs into the lungs every 4 (four) hours as needed for wheezing.  Current Outpatient Medications (Analgesics):    aspirin EC 81 MG EC tablet, Take 1 tablet (81 mg total) by mouth daily.   traMADol (ULTRAM) 50 MG tablet, Take 50 mg by mouth every 6 (six) hours as needed for moderate pain.   Current Outpatient Medications  (Other):    bisacodyl (DULCOLAX) 5 MG EC tablet, Take 5 mg by mouth daily as needed for moderate constipation.   Coenzyme Q10 200 MG capsule, Take 200 mg by mouth daily.   cyclobenzaprine (FLEXERIL) 5 MG tablet, Take 1 tablet (5 mg total) by mouth 3 (three) times daily as needed for muscle spasms.   fluorouracil (EFUDEX) 5 % cream, Apply 1 application. topically daily as needed (skin cancer).   pantoprazole (PROTONIX) 20 MG tablet, Take 20 mg by mouth 2 (two) times daily before a meal.   polyethylene glycol powder (GLYCOLAX/MIRALAX) powder, Take 17 g by mouth as needed for mild constipation.    tamsulosin (FLOMAX) 0.4  MG CAPS capsule, TAKE 2 CAPSULES BY MOUTH EVERY DAY   escitalopram (LEXAPRO) 5 MG tablet, Take 1 tablet (5 mg total) by mouth daily. (Patient not taking: Reported on 01/06/2023)  Medical History:  Past Medical History:  Diagnosis Date   Allergy    seasonal   Anxiety    Arthritis    Asthma    Cataract    Coronary artery disease 12/08/2016   STEMI, DES Circumflex   GERD (gastroesophageal reflux disease)    Hyperlipidemia    Hypothyroidism    STEMI (ST elevation myocardial infarction) (HCC) 12/08/2016   Allergies:  Allergies  Allergen Reactions   Contrast Media [Iodinated Contrast Media] Shortness Of Breath and Swelling    Swelling of the throat    Erythromycin Nausea And Vomiting   Niacin And Related Swelling   Penicillins Hives    Has patient had a PCN reaction causing immediate rash, facial/tongue/throat swelling, SOB or lightheadedness with hypotension: Y Has patient had a PCN reaction causing severe rash involving mucus membranes or skin necrosis: No Has patient had a PCN reaction that required hospitalization: No Has patient had a PCN reaction occurring within the last 10 years: No If all of the above answers are "NO", then may proceed with Cephalosporin use.   Gabapentin Swelling    Severe swelling in legs     Surgical History:  He  has a past surgical  history that includes Colonoscopy (05/31/2004); Shoulder surgery (Right); Tonsillectomy; cataract (Bilateral, 03/2016); LEFT HEART CATH AND CORONARY ANGIOGRAPHY (N/A, 12/08/2016); epidural steroid shot; Colonoscopy (06/30/2018); Polypectomy; Upper gastrointestinal endoscopy; Eye surgery; and Lumbar laminectomy/decompression microdiscectomy (Bilateral, 01/21/2022). Family History:  His family history includes Diabetes in his mother; Diverticulosis in his mother and sister; Heart disease in his sister; Heart failure in his mother; Irritable bowel syndrome in his mother and sister; Lung cancer in his father.  REVIEW OF SYSTEMS  : All other systems reviewed and negative except where noted in the History of Present Illness.  PHYSICAL EXAM: BP 130/70   Pulse (!) 55   Ht 6' (1.829 m)   Wt 215 lb (97.5 kg)   BMI 29.16 kg/m  General Appearance: Well nourished, in no apparent distress. Head:   Normocephalic and atraumatic. Eyes:  sclerae anicteric,conjunctive pink  Respiratory: Respiratory effort normal, BS equal bilaterally without rales, rhonchi, wheezing. Cardio: RRR with no MRGs. Peripheral pulses intact.  Abdomen: Soft,  Obese ,ventral hernia, active bowel sounds. mild tenderness in the epigastrium. Without guarding and Without rebound. No masses. Rectal: Not evaluated Musculoskeletal: Full ROM, Normal gait. Without edema. Skin:  Dry and intact without significant lesions or rashes Neuro: Alert and  oriented x4;  No focal deficits. Psych:  Cooperative. Normal mood and affect.    Doree Albee, PA-C 2:46 PM

## 2023-01-06 ENCOUNTER — Ambulatory Visit (INDEPENDENT_AMBULATORY_CARE_PROVIDER_SITE_OTHER): Payer: Medicare Other | Admitting: Physician Assistant

## 2023-01-06 ENCOUNTER — Encounter: Payer: Self-pay | Admitting: Physician Assistant

## 2023-01-06 VITALS — BP 130/70 | HR 55 | Ht 72.0 in | Wt 215.0 lb

## 2023-01-06 DIAGNOSIS — R0789 Other chest pain: Secondary | ICD-10-CM

## 2023-01-06 DIAGNOSIS — K219 Gastro-esophageal reflux disease without esophagitis: Secondary | ICD-10-CM

## 2023-01-06 DIAGNOSIS — I2510 Atherosclerotic heart disease of native coronary artery without angina pectoris: Secondary | ICD-10-CM | POA: Diagnosis not present

## 2023-01-06 NOTE — Patient Instructions (Addendum)
You have been scheduled for an endoscopy. Please follow written instructions given to you at your visit today. If you use inhalers (even only as needed), please bring them with you on the day of your procedure.  Miralax is an osmotic laxative.  It only brings more water into the stool.  This is safe to take daily.  Can take up to 17 gram of miralax twice a day.  Mix with juice or coffee.  Start 1 capful at night for 3-4 days and reassess your response in 3-4 days.  You can increase and decrease the dose based on your response.  Remember, it can take up to 3-4 days to take effect OR for the effects to wear off.   I often pair this with benefiber or citracel in the morning to help assure the stool is not too loose.   If miralax does not help, can do linzess, call and we can leave samples up front for you.   Linzess  *IBS-C patients may begin to experience relief from belly pain and overall abdominal symptoms (pain, discomfort, and bloating) in about 1 week,  with symptoms typically improving over 12 weeks.  Take at least 30 minutes before the first meal of the day on an empty stomach You can have a loose stool if you eat a high-fat breakfast. Give it at least 7 days, may have more bowel movements during that time.   The diarrhea should go away and you should start having normal, complete, full bowel movements.  It may be helpful to start treatment when you can be near the comfort of your own bathroom, such as a weekend.  After you are out we can send in a prescription if you did well, there is a prescription card   Please take your proton pump inhibitor medication, twice daily  Please take this medication 30 minutes to 1 hour before meals- this makes it more effective.  Avoid spicy and acidic foods Avoid fatty foods Limit your intake of coffee, tea, alcohol, and carbonated drinks Work to maintain a healthy weight Keep the head of the bed elevated at least 3 inches with blocks or a  wedge pillow if you are having any nighttime symptoms Stay upright for 2 hours after eating Avoid meals and snacks three to four hours before bedtime

## 2023-01-07 ENCOUNTER — Encounter: Payer: Self-pay | Admitting: Gastroenterology

## 2023-01-07 ENCOUNTER — Ambulatory Visit (AMBULATORY_SURGERY_CENTER): Payer: Medicare Other | Admitting: Gastroenterology

## 2023-01-07 VITALS — BP 121/62 | HR 48 | Temp 97.5°F | Resp 10 | Ht 72.0 in | Wt 215.0 lb

## 2023-01-07 DIAGNOSIS — R0789 Other chest pain: Secondary | ICD-10-CM | POA: Diagnosis not present

## 2023-01-07 DIAGNOSIS — R1319 Other dysphagia: Secondary | ICD-10-CM

## 2023-01-07 DIAGNOSIS — K219 Gastro-esophageal reflux disease without esophagitis: Secondary | ICD-10-CM

## 2023-01-07 MED ORDER — SODIUM CHLORIDE 0.9 % IV SOLN
500.0000 mL | Freq: Once | INTRAVENOUS | Status: DC
Start: 2023-01-07 — End: 2023-01-07

## 2023-01-07 NOTE — Op Note (Signed)
[facility] Patient Name: Ryan [name] Procedure Date: 01/07/2023 3:23 PM MRN: [id number] Endoscopist: Ryan [name] , MD, [id number] Age: 77 Referring MD:  Date of Birth: 18-May-1946 Gender: Male Account #: [id number] Procedure:                Upper GI endoscopy Indications:              Esophageal dysphagia, Chest pain (non cardiac)                           clinical details in 01/06/23 office consult note                           exacerbation of chest pain after weaning off                            chronic PPI - slowly improving back on pantoprazole                            40mg  BID Medicines:                Monitored Anesthesia Care Procedure:                Pre-Anesthesia Assessment:                           - Prior to the procedure, a History and Physical                            was performed, and patient medications and                            allergies were reviewed. The patient's tolerance of                            previous anesthesia was also reviewed. The risks                            and benefits of the procedure and the sedation                            options and risks were discussed with the patient.                            All questions were answered, and informed consent                            was obtained. Prior Anticoagulants: The patient has                            taken no anticoagulant or antiplatelet agents. ASA                            Grade Assessment: III - A patient with severe  systemic disease. After reviewing the risks and                            benefits, the patient was deemed in satisfactory                            condition to undergo the procedure.                           After obtaining informed consent, the endoscope was                            passed under direct vision. Throughout the                            procedure, the patient's blood pressure, pulse, and                             oxygen saturations were monitored continuously. The                            GIF [id number] #[id number] was introduced through the                            mouth, and advanced to the second part of duodenum.                            The upper GI endoscopy was accomplished without                            difficulty. The patient tolerated the procedure                            well. Scope In: Scope Out: Findings:                 The larynx was normal.                           The esophagus was normal. No resistance passing                            scope through EGJ. No dilatation, esophagitis or                            stricture.                           The stomach was normal.                           The cardia and gastric fundus were normal on                            retroflexion.  The examined duodenum was normal. Complications:            No immediate complications. Estimated Blood Loss:     Estimated blood loss: none. Impression:               - Normal larynx.                           - Normal esophagus.                           - Normal stomach.                           - Normal examined duodenum.                           - No specimens collected.                           Recent flare of reflux causing chest pain and a                            likely reflux-related esophageal dysmotility - all                            reportedly improving on current medical treatment. Recommendation:           - Patient has a contact number available for                            emergencies. The signs and symptoms of potential                            delayed complications were discussed with the                            patient. Return to normal activities tomorrow.                            Written discharge instructions were provided to the                            patient.                           - Resume previous  diet.                           - Continue present medications.                           - Contact our office by call or portal message in                            3-4 weeks ( or sooner if needed) with an update on  symptoms. Then we will determine best plan for PPI                            management. Ryan Humm L. Myrtie Neither, MD 01/07/2023 3:48:11 PM This report has been signed electronically.

## 2023-01-07 NOTE — Progress Notes (Signed)
Pt's states no medical or surgical changes since previsit or office visit. 

## 2023-01-07 NOTE — Progress Notes (Unsigned)
No changes to clinical history since GI office visit on 01/06/23. Note reviewed in its entirety and patient seen/evaluated before procedure. The patient is appropriate for an endoscopic procedure in the ambulatory setting.  - Amada Jupiter, MD

## 2023-01-07 NOTE — Progress Notes (Signed)
Vss nad trans to pacu 

## 2023-01-07 NOTE — Patient Instructions (Addendum)
Continue present medications. Contact our office by call or portal message in 3-4 weeks ( or sooner if needed) with an update on symptoms. Then we will determine best plan for PPI  management.                                                                         YOU HAD AN ENDOSCOPIC PROCEDURE TODAY AT THE [facility]:   Refer to the procedure report that was given to you for any specific questions about what was found during the examination.  If the procedure report does not answer your questions, please call your gastroenterologist to clarify.  If you requested that your care partner not be given the details of your procedure findings, then the procedure report has been included in a sealed envelope for you to review at your convenience later.  YOU SHOULD EXPECT: Some feelings of bloating in the abdomen. Passage of more gas than usual.  Walking can help get rid of the air that was put into your GI tract during the procedure and reduce the bloating.   Please Note:  You might notice some irritation and congestion in your nose or some drainage.  This is from the oxygen used during your procedure.  There is no need for concern and it should clear up in a day or so.  SYMPTOMS TO REPORT IMMEDIATELY: Following upper endoscopy (EGD)  Vomiting of blood or coffee ground material  New chest pain or pain under the shoulder blades  Painful or persistently difficult swallowing  New shortness of breath  Fever of 100F or higher  Black, tarry-looking stools  For urgent or emergent issues, a gastroenterologist can be reached at any hour by calling [phone]. Do not use MyChart messaging for urgent concerns.    DIET:  We do recommend a small meal at first, but then you may proceed to your regular diet.  Drink plenty of fluids but you should avoid alcoholic beverages for 24 hours.  ACTIVITY:  You should plan to take it easy for the rest of today and you should NOT DRIVE or use heavy  machinery until tomorrow (because of the sedation medicines used during the test).    FOLLOW UP: Our staff will call the number listed on your records the next business day following your procedure.  We will call around 7:15- 8:00 am to check on you and address any questions or concerns that you may have regarding the information given to you following your procedure. If we do not reach you, we will leave a message.       SIGNATURES/CONFIDENTIALITY: You and/or your care partner have signed paperwork which will be entered into your electronic medical record.  These signatures attest to the fact that that the information above on your After Visit Summary has been reviewed and is understood.  Full responsibility of the confidentiality of this discharge information lies with you and/or your care-partner.

## 2023-01-08 ENCOUNTER — Telehealth: Payer: Self-pay | Admitting: *Deleted

## 2023-01-08 NOTE — Telephone Encounter (Signed)
  Follow up Call-     01/07/2023    3:12 PM  Call back number  Post procedure Call Back phone  # [phone]  Permission to leave phone message Yes     Patient questions:  Do you have a fever, pain , or abdominal swelling? No. Pain Score  0 *  Have you tolerated food without any problems? Yes.    Have you been able to return to your normal activities? Yes.    Do you have any questions about your discharge instructions: Diet   No. Medications  No. Follow up visit  No.  Do you have questions or concerns about your Care? No.  Actions: * If pain score is 4 or above: No action needed, pain <4.

## 2023-02-11 ENCOUNTER — Ambulatory Visit (INDEPENDENT_AMBULATORY_CARE_PROVIDER_SITE_OTHER): Payer: Medicare Other | Admitting: Family Medicine

## 2023-02-11 ENCOUNTER — Encounter: Payer: Self-pay | Admitting: Family Medicine

## 2023-02-11 VITALS — BP 135/77 | HR 59 | Temp 98.0°F | Resp 18 | Ht 72.0 in | Wt 219.1 lb

## 2023-02-11 DIAGNOSIS — R300 Dysuria: Secondary | ICD-10-CM | POA: Diagnosis not present

## 2023-02-11 DIAGNOSIS — J029 Acute pharyngitis, unspecified: Secondary | ICD-10-CM | POA: Diagnosis not present

## 2023-02-11 DIAGNOSIS — M48062 Spinal stenosis, lumbar region with neurogenic claudication: Secondary | ICD-10-CM | POA: Diagnosis not present

## 2023-02-11 LAB — POC URINALSYSI DIPSTICK (AUTOMATED)
Bilirubin, UA: NEGATIVE
Blood, UA: NEGATIVE
Glucose, UA: NEGATIVE
Ketones, UA: NEGATIVE
Leukocytes, UA: NEGATIVE
Nitrite, UA: NEGATIVE
Protein, UA: NEGATIVE
Spec Grav, UA: >=1.03 — AB (ref 1.010–1.025)
Urobilinogen, UA: 0.2 E.U./dL
pH, UA: 5 (ref 5.0–8.0)

## 2023-02-11 LAB — POCT RAPID STREP A (OFFICE): Rapid Strep A Screen: NEGATIVE

## 2023-02-11 MED ORDER — CEPHALEXIN 500 MG PO CAPS: 500.0000 mg | ORAL_CAPSULE | Freq: Two times a day (BID) | ORAL | 0 refills | Status: AC

## 2023-02-11 NOTE — Progress Notes (Signed)
Subjective:     Patient ID: Ryan [name], male    DOB: 1946-04-26, 77 y.o.   MRN: [id number]  Chief Complaint  Patient presents with   Sore Throat    Started last Friday Negative Covid yesterday   Nasal Congestion    HPI Sore throat - He complains of sore throat and slight cough starting Friday. He took an at home Covid test, result was negative. He has also felt feverish, possible tactile temperature. He notes his sore throat improves throughout the day but worsens again at nighttime and first am. Denies any congestion, rhinorrhea, or v/d.  Right lower abd pain - he reports having an episode of right lower abd pain while passing a kidney stone about 3 days ago. He complains of intermittent dysuria (burning sensation) with urination.  He is on Lexapro 5 mg daily. Needs tramadol but concerned about interactions  There are no preventive care reminders to display for this patient.   Past Medical History:  Diagnosis Date   Allergy    seasonal   Anxiety    Arthritis    Asthma    Cataract    Coronary artery disease 12/08/2016   STEMI, DES Circumflex   GERD (gastroesophageal reflux disease)    Hyperlipidemia    Hypothyroidism    STEMI (ST elevation myocardial infarction) (HCC) 12/08/2016    Past Surgical History:  Procedure Laterality Date   cataract Bilateral 03/2016   COLONOSCOPY  05/31/2004   COLONOSCOPY  06/30/2018   epidural steroid shot     x 3 , lumbar spine area   EYE SURGERY     bilateral cataracts   LEFT HEART CATH AND CORONARY ANGIOGRAPHY N/A 12/08/2016   Procedure: Left Heart Cath and Coronary Angiography;  Surgeon: Tonny Bollman, MD;  Location: [facility] INVASIVE CV LAB;  Service: Cardiovascular;  Laterality: N/A;   LUMBAR LAMINECTOMY/DECOMPRESSION MICRODISCECTOMY Bilateral 01/21/2022   Procedure: Lumbar Two-Three, Lumbar Three-Four/Lumbar Four-Five LAMINOTOMY/FORAMINOTOMY;  Surgeon: Tressie Stalker, MD;  Location: [facility] OR;  Service: Neurosurgery;  Laterality:  Bilateral;  3C   POLYPECTOMY     SHOULDER SURGERY Right    Rotator Cuff Tear Repair   TONSILLECTOMY     UPPER GASTROINTESTINAL ENDOSCOPY       Current Outpatient Medications:    albuterol (VENTOLIN HFA) 108 (90 Base) MCG/ACT inhaler, Inhale 2 puffs into the lungs every 4 (four) hours as needed for wheezing., Disp: 1 each, Rfl: 2   aspirin EC 81 MG EC tablet, Take 1 tablet (81 mg total) by mouth daily., Disp: 30 tablet, Rfl: 11   atorvastatin (LIPITOR) 80 MG tablet, Take 1 tablet (80 mg total) by mouth daily., Disp: 90 tablet, Rfl: 3   bisacodyl (DULCOLAX) 5 MG EC tablet, Take 5 mg by mouth daily as needed for moderate constipation., Disp: , Rfl:    cephALEXin (KEFLEX) 500 MG capsule, Take 1 capsule (500 mg total) by mouth 2 (two) times daily for 7 days. Take for 7 days, Disp: 14 capsule, Rfl: 0   Coenzyme Q10 200 MG capsule, Take 200 mg by mouth daily., Disp: , Rfl:    cyclobenzaprine (FLEXERIL) 5 MG tablet, Take 1 tablet (5 mg total) by mouth 3 (three) times daily as needed for muscle spasms., Disp: 50 tablet, Rfl: 0   escitalopram (LEXAPRO) 5 MG tablet, Take 1 tablet (5 mg total) by mouth daily., Disp: 90 tablet, Rfl: 1   fluorouracil (EFUDEX) 5 % cream, Apply 1 application. topically daily as needed (skin cancer)., Disp: , Rfl:  nitroGLYCERIN (NITROSTAT) 0.4 MG SL tablet, Place 1 tablet (0.4 mg total) under the tongue every 5 (five) minutes as needed for chest pain., Disp: 25 tablet, Rfl: 5   pantoprazole (PROTONIX) 20 MG tablet, Take 20 mg by mouth 2 (two) times daily before a meal., Disp: , Rfl:    polyethylene glycol powder (GLYCOLAX/MIRALAX) powder, Take 17 g by mouth as needed for mild constipation. , Disp: , Rfl:    SYNTHROID 88 MCG tablet, TAKE 1 TABLET BY MOUTH EVERY DAY, Disp: 90 tablet, Rfl: 0   tamsulosin (FLOMAX) 0.4 MG CAPS capsule, TAKE 2 CAPSULES BY MOUTH EVERY DAY, Disp: 180 capsule, Rfl: 0   traMADol (ULTRAM) 50 MG tablet, Take 50 mg by mouth every 6 (six) hours as  needed for moderate pain., Disp: , Rfl:   Allergies  Allergen Reactions   Contrast Media [Iodinated Contrast Media] Shortness Of Breath and Swelling    Swelling of the throat    Erythromycin Nausea And Vomiting   Niacin And Related Swelling   Penicillins Hives    Has patient had a PCN reaction causing immediate rash, facial/tongue/throat swelling, SOB or lightheadedness with hypotension: Y Has patient had a PCN reaction causing severe rash involving mucus membranes or skin necrosis: No Has patient had a PCN reaction that required hospitalization: No Has patient had a PCN reaction occurring within the last 10 years: No If all of the above answers are "NO", then may proceed with Cephalosporin use.   Gabapentin Swelling    Severe swelling in legs   Gadolinium Derivatives     Other Reaction(s): difficulty breathing   Penicillin G     Other Reaction(s): rash, nausea   ROS neg/noncontributory except as noted HPI/below      Objective:     BP 135/77   Pulse (!) 59   Temp 98 F (36.7 C) (Temporal)   Resp 18   Ht 6' (1.829 m)   Wt 219 lb 2 oz (99.4 kg)   SpO2 94%   BMI 29.72 kg/m  Wt Readings from Last 3 Encounters:  02/11/23 219 lb 2 oz (99.4 kg)  01/07/23 215 lb (97.5 kg)  01/06/23 215 lb (97.5 kg)    Physical Exam   Gen: WDWN NAD HEENT: NCAT, conjunctiva not injected, sclera nonicteric, TM WNL B, OP moist, no exudates. Neck: no nodes CARDIAC: RRR, S1S2+, no murmur.  LUNGS: CTAB. No wheezes ABDOMEN:  BS+, soft, NTND, No HSM, no masses. No cvat NEURO: A&O x3.  CN II-XII intact.  PSYCH: normal mood. Good eye contact  Results for orders placed or performed in visit on 02/11/23  POCT rapid strep A  Result Value Ref Range   Rapid Strep A Screen Negative Negative  POCT Urinalysis Dipstick (Automated)  Result Value Ref Range   Color, UA YELLOW    Clarity, UA CLOUDY    Glucose, UA Negative Negative   Bilirubin, UA NEG    Ketones, UA NEG    Spec Grav, UA >=1.030 (A)  1.010 - 1.025   Blood, UA NEG    pH, UA 5.0 5.0 - 8.0   Protein, UA Negative Negative   Urobilinogen, UA 0.2 0.2 or 1.0 E.U./dL   Nitrite, UA NEG    Leukocytes, UA Negative Negative       Assessment & Plan:  Sore throat -     POCT rapid strep A  Dysuria -     POCT Urinalysis Dipstick (Automated)  Other orders -     Cephalexin; Take  1 capsule (500 mg total) by mouth 2 (two) times daily for 7 days. Take for 7 days  Dispense: 14 capsule; Refill: 0  1.  Pharyngitis-possibly viral.  May also be GERD, dry ear, other.  Given age, intensity, comorbidities-will treat with Keflex 500 mg twice daily 2.  Dysuria-intermittent.  Sounds like he passed a kidney stone fairly recently.  Urinalysis unremarkable.  Keflex will cover possibilities of UTI.  Continue to monitor.  If pain recurs, symptoms continue-will need imaging. 3.  Chronic low back pain-due to CAD, was told not to take naproxen.  Tylenol does not help.  Tramadol helps, but he is concerned about being on Lexapro 5 mg.  Advised that he is probably okay taking tramadol 1-2 times a day.  Also take Tylenol as well.  If he is having a really bad day and needs to take 3, monitor closely-discussed symptoms of serotonin syndrome.  Return if symptoms worsen or fail to improve.   I,Rachel Rivera,acting as a scribe for Angelena Sole, MD.,have documented all relevant documentation on the behalf of Angelena Sole, MD,as directed by  Angelena Sole, MD while in the presence of Angelena Sole, MD.  I, Angelena Sole, MD, have reviewed all documentation for this visit. The documentation on 02/11/23 for the exam, diagnosis, procedures, and orders are all accurate and complete.   Angelena Sole, MD

## 2023-02-11 NOTE — Patient Instructions (Signed)
Keflex

## 2023-02-14 ENCOUNTER — Other Ambulatory Visit: Payer: Self-pay | Admitting: Family Medicine

## 2023-02-14 DIAGNOSIS — L820 Inflamed seborrheic keratosis: Secondary | ICD-10-CM

## 2023-02-14 DIAGNOSIS — E039 Hypothyroidism, unspecified: Secondary | ICD-10-CM

## 2023-02-14 DIAGNOSIS — E032 Hypothyroidism due to medicaments and other exogenous substances: Secondary | ICD-10-CM

## 2023-02-14 DIAGNOSIS — J302 Other seasonal allergic rhinitis: Secondary | ICD-10-CM

## 2023-02-14 DIAGNOSIS — E038 Other specified hypothyroidism: Secondary | ICD-10-CM

## 2023-03-12 ENCOUNTER — Other Ambulatory Visit: Payer: Self-pay | Admitting: Family

## 2023-03-12 DIAGNOSIS — F418 Other specified anxiety disorders: Secondary | ICD-10-CM

## 2023-03-13 ENCOUNTER — Ambulatory Visit (INDEPENDENT_AMBULATORY_CARE_PROVIDER_SITE_OTHER): Payer: Medicare Other | Admitting: Family

## 2023-03-13 VITALS — BP 147/83 | HR 55 | Temp 98.0°F | Ht 72.0 in | Wt 221.0 lb

## 2023-03-13 DIAGNOSIS — R222 Localized swelling, mass and lump, trunk: Secondary | ICD-10-CM | POA: Diagnosis not present

## 2023-03-13 NOTE — Progress Notes (Signed)
Patient ID: Ryan [name], male    DOB: September 01, 1945, 77 y.o.   MRN: [id number]  Chief Complaint  Patient presents with   Mass    Pt c/o mass on chest area for 6 days. Pt states mass is painful.     HPI:      Mass on chest:  approx. 6cm in diameter, tender to touch, reports first noticing about a week ago. States is moves a little when he pushes on it and he notices it more when he is lying down. States he has hx of a cyst on his back that was removed, but nothing else. He is very concerned about cancer especially as he is caring for his sister w/cancer.    Assessment & Plan:  1. Chest wall mass approx 6-7cm in diameter, slightly raised on skin, tender to palpation, fixed, just below right clavicle, no discoloration noted. Marked area with permanent marker to monitor any further growth as pt reports it has come on quickly.  Ordering U/S, will try to push through sooner than later, explained to pt about radiology delays.   - [place] CHEST SOFT TISSUE; Future   Subjective:    Outpatient Medications Prior to Visit  Medication Sig Dispense Refill   albuterol (VENTOLIN HFA) 108 (90 Base) MCG/ACT inhaler Inhale 2 puffs into the lungs every 4 (four) hours as needed for wheezing. 1 each 2   aspirin EC 81 MG EC tablet Take 1 tablet (81 mg total) by mouth daily. 30 tablet 11   atorvastatin (LIPITOR) 80 MG tablet Take 1 tablet (80 mg total) by mouth daily. 90 tablet 3   bisacodyl (DULCOLAX) 5 MG EC tablet Take 5 mg by mouth daily as needed for moderate constipation.     Coenzyme Q10 200 MG capsule Take 200 mg by mouth daily.     cyclobenzaprine (FLEXERIL) 5 MG tablet Take 1 tablet (5 mg total) by mouth 3 (three) times daily as needed for muscle spasms. 50 tablet 0   escitalopram (LEXAPRO) 5 MG tablet TAKE 1 TABLET (5 MG TOTAL) BY MOUTH DAILY. 90 tablet 1   fluorouracil (EFUDEX) 5 % cream Apply 1 application. topically daily as needed (skin cancer).     nitroGLYCERIN (NITROSTAT) 0.4 MG SL tablet Place  1 tablet (0.4 mg total) under the tongue every 5 (five) minutes as needed for chest pain. 25 tablet 5   pantoprazole (PROTONIX) 20 MG tablet Take 20 mg by mouth 2 (two) times daily before a meal.     polyethylene glycol powder (GLYCOLAX/MIRALAX) powder Take 17 g by mouth as needed for mild constipation.      SYNTHROID 88 MCG tablet TAKE 1 TABLET BY MOUTH EVERY DAY 90 tablet 1   tamsulosin (FLOMAX) 0.4 MG CAPS capsule TAKE 2 CAPSULES BY MOUTH EVERY DAY 180 capsule 0   traMADol (ULTRAM) 50 MG tablet Take 50 mg by mouth every 6 (six) hours as needed for moderate pain.     No facility-administered medications prior to visit.   Past Medical History:  Diagnosis Date   Allergy    seasonal   Anxiety    Arthritis    Asthma    Cataract    Coronary artery disease 12/08/2016   STEMI, DES Circumflex   GERD (gastroesophageal reflux disease)    Hyperlipidemia    Hypothyroidism    STEMI (ST elevation myocardial infarction) (HCC) 12/08/2016   Past Surgical History:  Procedure Laterality Date   cataract Bilateral 03/2016   COLONOSCOPY  05/31/2004  COLONOSCOPY  06/30/2018   epidural steroid shot     x 3 , lumbar spine area   EYE SURGERY     bilateral cataracts   LEFT HEART CATH AND CORONARY ANGIOGRAPHY N/A 12/08/2016   Procedure: Left Heart Cath and Coronary Angiography;  Surgeon: Tonny Bollman, MD;  Location: [facility] INVASIVE CV LAB;  Service: Cardiovascular;  Laterality: N/A;   LUMBAR LAMINECTOMY/DECOMPRESSION MICRODISCECTOMY Bilateral 01/21/2022   Procedure: Lumbar Two-Three, Lumbar Three-Four/Lumbar Four-Five LAMINOTOMY/FORAMINOTOMY;  Surgeon: Tressie Stalker, MD;  Location: [facility] OR;  Service: Neurosurgery;  Laterality: Bilateral;  3C   POLYPECTOMY     SHOULDER SURGERY Right    Rotator Cuff Tear Repair   TONSILLECTOMY     UPPER GASTROINTESTINAL ENDOSCOPY     Allergies  Allergen Reactions   Contrast Media [Iodinated Contrast Media] Shortness Of Breath and Swelling    Swelling of the throat     Erythromycin Nausea And Vomiting   Niacin And Related Swelling   Penicillins Hives    Has patient had a PCN reaction causing immediate rash, facial/tongue/throat swelling, SOB or lightheadedness with hypotension: Y Has patient had a PCN reaction causing severe rash involving mucus membranes or skin necrosis: No Has patient had a PCN reaction that required hospitalization: No Has patient had a PCN reaction occurring within the last 10 years: No If all of the above answers are "NO", then may proceed with Cephalosporin use.   Gabapentin Swelling    Severe swelling in legs   Gadolinium Derivatives     Other Reaction(s): difficulty breathing   Penicillin G     Other Reaction(s): rash, nausea      Objective:    Physical Exam Vitals and nursing note reviewed.  Constitutional:      General: He is not in acute distress.    Appearance: Normal appearance.  HENT:     Head: Normocephalic.  Cardiovascular:     Rate and Rhythm: Normal rate and regular rhythm.  Pulmonary:     Effort: Pulmonary effort is normal.     Breath sounds: Normal breath sounds.  Chest:     Chest wall: Mass (approx 6-7 cm in diameter, irregular border, raised in center above skin slightly, tender, no discoloration, fixed) present.    Musculoskeletal:        General: Normal range of motion.     Cervical back: Normal range of motion.  Skin:    General: Skin is warm and dry.  Neurological:     Mental Status: He is alert and oriented to person, place, and time.  Psychiatric:        Mood and Affect: Mood normal.    BP (!) 147/83   Pulse (!) 55   Temp 98 F (36.7 C) (Temporal)   Ht 6' (1.829 m)   Wt 221 lb (100.2 kg)   SpO2 97%   BMI 29.97 kg/m  Wt Readings from Last 3 Encounters:  03/13/23 221 lb (100.2 kg)  02/11/23 219 lb 2 oz (99.4 kg)  01/07/23 215 lb (97.5 kg)      Dulce Sellar, NP

## 2023-03-17 ENCOUNTER — Ambulatory Visit: Payer: Medicare Other | Admitting: Cardiovascular Disease

## 2023-03-18 ENCOUNTER — Ambulatory Visit
Admission: RE | Admit: 2023-03-18 | Discharge: 2023-03-18 | Disposition: A | Discharge status: Home / Self Care | Payer: Medicare Other | Source: Ambulatory Visit | Attending: Family | Admitting: Family

## 2023-03-18 DIAGNOSIS — R222 Localized swelling, mass and lump, trunk: Secondary | ICD-10-CM

## 2023-03-23 ENCOUNTER — Other Ambulatory Visit: Payer: Self-pay | Admitting: Family Medicine

## 2023-03-24 NOTE — Telephone Encounter (Signed)
I sent in refill- please let Ryan [name] know also I have not forgotten his chest U/S - I've asked our office manager to speed up radiology results. thx

## 2023-03-24 NOTE — Telephone Encounter (Signed)
Last ordered: 2 months ago (01/06/2023) by Historical Provider, MD   Last refill: 12/26/2022

## 2023-03-25 NOTE — Addendum Note (Signed)
Addended byDulce Sellar on: 03/25/2023 05:44 PM   Modules accepted: Orders

## 2023-03-25 NOTE — Telephone Encounter (Signed)
Pt gave a verbalized understanding. ?

## 2023-03-26 ENCOUNTER — Encounter: Payer: Self-pay | Admitting: Family

## 2023-03-26 ENCOUNTER — Encounter: Payer: Self-pay | Admitting: Cardiovascular Disease

## 2023-03-26 ENCOUNTER — Ambulatory Visit: Payer: Medicare Other | Attending: Cardiovascular Disease | Admitting: Cardiovascular Disease

## 2023-03-26 ENCOUNTER — Other Ambulatory Visit: Payer: Self-pay | Admitting: Family

## 2023-03-26 VITALS — BP 120/70 | HR 55 | Ht 72.0 in | Wt 218.6 lb

## 2023-03-26 DIAGNOSIS — I10 Essential (primary) hypertension: Secondary | ICD-10-CM | POA: Insufficient documentation

## 2023-03-26 DIAGNOSIS — R222 Localized swelling, mass and lump, trunk: Secondary | ICD-10-CM

## 2023-03-26 DIAGNOSIS — I2510 Atherosclerotic heart disease of native coronary artery without angina pectoris: Secondary | ICD-10-CM | POA: Diagnosis present

## 2023-03-26 DIAGNOSIS — E785 Hyperlipidemia, unspecified: Secondary | ICD-10-CM | POA: Diagnosis not present

## 2023-03-26 MED ORDER — NITROGLYCERIN 0.4 MG SL SUBL: SUBLINGUAL_TABLET | SUBLINGUAL | 5 refills | Status: AC

## 2023-03-26 MED ORDER — ATORVASTATIN CALCIUM 80 MG PO TABS: 80.0000 mg | ORAL_TABLET | Freq: Every day | ORAL | 3 refills | Status: DC

## 2023-03-26 NOTE — Progress Notes (Signed)
Cardiology Office Note:    Date:  03/26/2023   ID:  Ryan [name], DOB [birth year], MRN [id number]  PCP:  Dulce Sellar, NP   [facility] Providers Cardiologist:  Tonny Bollman, MD     Referring MD: Dulce Sellar, NP   Chief Complaint  Patient presents with   Coronary Artery Disease    History of Present Illness:    Ryan [name] is a 77 y.o. male presenting for follow-up of coronary artery disease.  The patient initially presented in 2018 with a STEMI involving the left circumflex, treated with primary PCI using drug-eluting stent.  The patient has been intolerant to beta-blockers because of bradycardia.  Lipids have been at goal on a high intensity statin drug.  He was last seen in our office in May 2024 by Jari Favre, PA.  The patient has had some atypical chest pain felt to be related to a large hiatal hernia.  Otherwise he has done well from a cardiovascular perspective with no recurrent ischemic events since his initial presentation in 2018.  From a cardiac perspective he is doing well. Today, he denies symptoms of palpitations, chest pain, shortness of breath, orthopnea, PND, lower extremity edema, dizziness, or syncope. He has a soft tissue nodule in the chest wall that's been recently evaluated by ultrasound and is planning on a surgical evaluation.   Past Medical History:  Diagnosis Date   Allergy    seasonal   Anxiety    Arthritis    Asthma    Cataract    Coronary artery disease 12/08/2016   STEMI, DES Circumflex   GERD (gastroesophageal reflux disease)    Hyperlipidemia    Hypothyroidism    STEMI (ST elevation myocardial infarction) (HCC) 12/08/2016    Past Surgical History:  Procedure Laterality Date   cataract Bilateral 03/2016   COLONOSCOPY  05/31/2004   COLONOSCOPY  06/30/2018   epidural steroid shot     x 3 , lumbar spine area   EYE SURGERY     bilateral cataracts   LEFT HEART CATH AND CORONARY ANGIOGRAPHY N/A 12/08/2016    Procedure: Left Heart Cath and Coronary Angiography;  Surgeon: Tonny Bollman, MD;  Location: [facility] INVASIVE CV LAB;  Service: Cardiovascular;  Laterality: N/A;   LUMBAR LAMINECTOMY/DECOMPRESSION MICRODISCECTOMY Bilateral 01/21/2022   Procedure: Lumbar Two-Three, Lumbar Three-Four/Lumbar Four-Five LAMINOTOMY/FORAMINOTOMY;  Surgeon: Tressie Stalker, MD;  Location: [facility] OR;  Service: Neurosurgery;  Laterality: Bilateral;  3C   POLYPECTOMY     SHOULDER SURGERY Right    Rotator Cuff Tear Repair   TONSILLECTOMY     UPPER GASTROINTESTINAL ENDOSCOPY      Current Medications: Current Meds  Medication Sig   albuterol (VENTOLIN HFA) 108 (90 Base) MCG/ACT inhaler Inhale 2 puffs into the lungs every 4 (four) hours as needed for wheezing.   ALPRAZolam (XANAX) 0.25 MG tablet Take 1 tablet by mouth 2 (two) times daily as needed.   aspirin EC 81 MG EC tablet Take 1 tablet (81 mg total) by mouth daily.   bisacodyl (DULCOLAX) 5 MG EC tablet Take 5 mg by mouth daily as needed for moderate constipation.   Coenzyme Q10 200 MG capsule Take 200 mg by mouth daily.   cyclobenzaprine (FLEXERIL) 5 MG tablet Take 1 tablet (5 mg total) by mouth 3 (three) times daily as needed for muscle spasms.   escitalopram (LEXAPRO) 5 MG tablet TAKE 1 TABLET (5 MG TOTAL) BY MOUTH DAILY.   fluorouracil (EFUDEX) 5 % cream Apply 1 application. topically  daily as needed (skin cancer).   pantoprazole (PROTONIX) 20 MG tablet TAKE 1 TABLET BY MOUTH EVERY DAY   polyethylene glycol powder (GLYCOLAX/MIRALAX) powder Take 17 g by mouth as needed for mild constipation.    SYNTHROID 88 MCG tablet TAKE 1 TABLET BY MOUTH EVERY DAY   tamsulosin (FLOMAX) 0.4 MG CAPS capsule TAKE 2 CAPSULES BY MOUTH EVERY DAY   traMADol (ULTRAM) 50 MG tablet Take 50 mg by mouth every 6 (six) hours as needed for moderate pain.   [DISCONTINUED] atorvastatin (LIPITOR) 80 MG tablet Take 1 tablet (80 mg total) by mouth daily.   [DISCONTINUED] nitroGLYCERIN (NITROSTAT) 0.4 MG  SL tablet Place 1 tablet (0.4 mg total) under the tongue every 5 (five) minutes as needed for chest pain.     Allergies:   Contrast media [iodinated contrast media], Erythromycin, Niacin and related, Penicillins, Gabapentin, Gadolinium derivatives, and Penicillin g   Social History   Socioeconomic History   Marital status: Married    Spouse name: Not on file   Number of children: 2   Years of education: Not on file   Highest education level: Not on file  Occupational History   Occupation: retired  Tobacco Use   Smoking status: Never   Smokeless tobacco: Never  Vaping Use   Vaping status: Never Used  Substance and Sexual Activity   Alcohol use: Yes    Alcohol/week: 3.0 standard drinks of alcohol    Types: 3 Glasses of wine per week    Comment: occasional   Drug use: No   Sexual activity: Not on file  Other Topics Concern   Not on file  Social History Narrative   Not on file   Social Determinants of Health   Financial Resource Strain: Low Risk  (07/15/2022)   Overall Financial Resource Strain (CARDIA)    Difficulty of Paying Living Expenses: Not hard at all  Food Insecurity: No Food Insecurity (07/15/2022)   Hunger Vital Sign    Worried About Running Out of Food in the Last Year: Never true    Ran Out of Food in the Last Year: Never true  Transportation Needs: No Transportation Needs (07/15/2022)   PRAPARE - Administrator, Civil Service (Medical): No    Lack of Transportation (Non-Medical): No  Physical Activity: Inactive (07/15/2022)   Exercise Vital Sign    Days of Exercise per Week: 0 days    Minutes of Exercise per Session: 0 min  Stress: No Stress Concern Present (07/15/2022)   [employer] of Occupational Health - Occupational Stress Questionnaire    Feeling of Stress : Not at all  Social Connections: Moderately Integrated (07/15/2022)   Social Connection and Isolation Panel [NHANES]    Frequency of Communication with Friends and Family: More  than three times a week    Frequency of Social Gatherings with Friends and Family: More than three times a week    Attends Religious Services: More than 4 times per year    Active Member of [employer] or Organizations: No    Attends Banker Meetings: Never    Marital Status: Married     Family History: The patient's family history includes Diabetes in his mother; Diverticulosis in his mother and sister; Heart disease in his sister; Heart failure in his mother; Irritable bowel syndrome in his mother and sister; Lung cancer in his father. There is no history of Colon cancer, Esophageal cancer, Pancreatic cancer, or Stomach cancer.  ROS:   Please see the  history of present illness.    All other systems reviewed and are negative.  EKGs/Labs/Other Studies Reviewed:         Recent Labs: 09/05/2022: TSH 4.45 12/22/2022: ALT 25; BUN 17; Creatinine, Ser 0.92; Hemoglobin 14.0; Platelets 248; Potassium 4.1; Sodium 134  Recent Lipid Panel    Component Value Date/Time   CHOL 116 09/05/2022 0933   CHOL 89 (L) 01/21/2017 0904   TRIG 68.0 09/05/2022 0933   HDL 41.10 09/05/2022 0933   HDL 31 (L) 01/21/2017 0904   CHOLHDL 3 09/05/2022 0933   VLDL 13.6 09/05/2022 0933   LDLCALC 62 09/05/2022 0933   LDLCALC 62 03/28/2020 1055   LDLDIRECT 144.0 05/13/2013 1036     Risk Assessment/Calculations:                Physical Exam:    VS:  BP 120/70   Pulse (!) 55   Ht 6' (1.829 m)   Wt 218 lb 9.6 oz (99.2 kg)   SpO2 95%   BMI 29.65 kg/m     Wt Readings from Last 3 Encounters:  03/26/23 218 lb 9.6 oz (99.2 kg)  03/13/23 221 lb (100.2 kg)  02/11/23 219 lb 2 oz (99.4 kg)     GEN:  Well nourished, well developed in no acute distress HEENT: Normal NECK: No JVD; No carotid bruits LYMPHATICS: No lymphadenopathy CARDIAC: RRR, no murmurs, rubs, gallops RESPIRATORY:  Clear to auscultation without rales, wheezing or rhonchi  ABDOMEN: Soft, non-tender, non-distended MUSCULOSKELETAL:   No edema; No deformity  SKIN: Warm and dry NEUROLOGIC:  Alert and oriented x 3 PSYCHIATRIC:  Normal affect   ASSESSMENT:    1. Coronary artery disease involving native coronary artery of native heart without angina pectoris   2. Essential hypertension   3. Hyperlipidemia LDL goal <70    PLAN:    In order of problems listed above:  The patient is stable without symptoms of angina.  He will continue aspirin for antiplatelet therapy and high intensity statin drug with atorvastatin 80 mg daily. Blood pressure well-controlled.  He is not currently on any antihypertensive therapy.  Most recent labs reviewed with potassium 4.1 and creatinine 0.92. Lipids reviewed with cholesterol 116, HDL 41, LDL 62.  Transaminases are normal.  Continue atorvastatin 80 mg daily.      Medication Adjustments/Labs and Tests Ordered: Current medicines are reviewed at length with the patient today.  Concerns regarding medicines are outlined above.  No orders of the defined types were placed in this encounter.  Meds ordered this encounter  Medications   nitroGLYCERIN (NITROSTAT) 0.4 MG SL tablet    Sig: Dissolve 1 tablet under the tongue every 5 minutes as needed for chest pain. Max of 3 doses, then 911.    Dispense:  25 tablet    Refill:  5   atorvastatin (LIPITOR) 80 MG tablet    Sig: Take 1 tablet (80 mg total) by mouth daily.    Dispense:  90 tablet    Refill:  3    Patient Instructions  Medication Instructions:  Your physician recommends that you continue on your current medications as directed. Please refer to the Current Medication list given to you today.  *If you need a refill on your cardiac medications before your next appointment, please call your pharmacy*   Lab Work: NONE If you have labs (blood work) drawn today and your tests are completely normal, you will receive your results only by: MyChart Message (if you have MyChart) OR  A paper copy in the mail If you have any lab test that  is abnormal or we need to change your treatment, we will call you to review the results.   Testing/Procedures: NONE   Follow-Up: At [facility], you and your health needs are our priority.  As part of our continuing mission to provide you with exceptional heart care, we have created designated Provider Care Teams.  These Care Teams include your primary Cardiologist (physician) and Advanced Practice Providers (APPs -  Physician Assistants and Nurse Practitioners) who all work together to provide you with the care you need, when you need it.  We recommend signing up for the patient portal called "MyChart".  Sign up information is provided on this After Visit Summary.  MyChart is used to connect with patients for Virtual Visits (Telemedicine).  Patients are able to view lab/test results, encounter notes, upcoming appointments, etc.  Non-urgent messages can be sent to your provider as well.   To learn more about what you can do with MyChart, go to [email or web address].    Your next appointment:   1 year(s)  Provider:   Tonny Bollman, MD        Signed, Tonny Bollman, MD  03/26/2023 4:54 PM    [facility]

## 2023-03-26 NOTE — Patient Instructions (Signed)

## 2023-04-01 ENCOUNTER — Other Ambulatory Visit: Payer: Self-pay

## 2023-06-27 ENCOUNTER — Other Ambulatory Visit: Payer: Self-pay | Admitting: Family

## 2023-06-27 DIAGNOSIS — N401 Enlarged prostate with lower urinary tract symptoms: Secondary | ICD-10-CM

## 2023-09-09 ENCOUNTER — Ambulatory Visit (INDEPENDENT_AMBULATORY_CARE_PROVIDER_SITE_OTHER): Payer: Medicare Other

## 2023-09-09 ENCOUNTER — Ambulatory Visit: Payer: Medicare Other | Admitting: Family

## 2023-09-09 ENCOUNTER — Encounter: Payer: Self-pay | Admitting: Family

## 2023-09-09 ENCOUNTER — Telehealth: Payer: Self-pay

## 2023-09-09 ENCOUNTER — Other Ambulatory Visit (HOSPITAL_COMMUNITY): Payer: Self-pay

## 2023-09-09 VITALS — Wt 218.0 lb

## 2023-09-09 VITALS — BP 147/79 | HR 60 | Temp 97.7°F | Ht 72.0 in | Wt 227.2 lb

## 2023-09-09 DIAGNOSIS — E7800 Pure hypercholesterolemia, unspecified: Secondary | ICD-10-CM | POA: Diagnosis not present

## 2023-09-09 DIAGNOSIS — Z0000 Encounter for general adult medical examination without abnormal findings: Secondary | ICD-10-CM

## 2023-09-09 DIAGNOSIS — E039 Hypothyroidism, unspecified: Secondary | ICD-10-CM | POA: Diagnosis not present

## 2023-09-09 DIAGNOSIS — R351 Nocturia: Secondary | ICD-10-CM | POA: Diagnosis not present

## 2023-09-09 DIAGNOSIS — F418 Other specified anxiety disorders: Secondary | ICD-10-CM | POA: Diagnosis not present

## 2023-09-09 DIAGNOSIS — K219 Gastro-esophageal reflux disease without esophagitis: Secondary | ICD-10-CM | POA: Diagnosis not present

## 2023-09-09 DIAGNOSIS — N401 Enlarged prostate with lower urinary tract symptoms: Secondary | ICD-10-CM | POA: Diagnosis not present

## 2023-09-09 DIAGNOSIS — E669 Obesity, unspecified: Secondary | ICD-10-CM | POA: Insufficient documentation

## 2023-09-09 DIAGNOSIS — R030 Elevated blood-pressure reading, without diagnosis of hypertension: Secondary | ICD-10-CM

## 2023-09-09 DIAGNOSIS — I10 Essential (primary) hypertension: Secondary | ICD-10-CM | POA: Insufficient documentation

## 2023-09-09 LAB — COMPREHENSIVE METABOLIC PANEL
ALT: 24 U/L (ref 0–53)
AST: 18 U/L (ref 0–37)
Albumin: 4 g/dL (ref 3.5–5.2)
Alkaline Phosphatase: 105 U/L (ref 39–117)
BUN: 11 mg/dL (ref 6–23)
CO2: 27 meq/L (ref 19–32)
Calcium: 8.8 mg/dL (ref 8.4–10.5)
Chloride: 103 meq/L (ref 96–112)
Creatinine, Ser: 0.87 mg/dL (ref 0.40–1.50)
GFR: 82.4 mL/min (ref >60.00)
Glucose, Bld: 84 mg/dL (ref 70–99)
Potassium: 4.2 meq/L (ref 3.5–5.1)
Sodium: 137 meq/L (ref 135–145)
Total Bilirubin: 0.8 mg/dL (ref 0.2–1.2)
Total Protein: 6.2 g/dL (ref 6.0–8.3)

## 2023-09-09 LAB — LIPID PANEL
Cholesterol: 117 mg/dL (ref 0–200)
HDL: 40.7 mg/dL (ref >39.00)
LDL Cholesterol: 63 mg/dL (ref 0–99)
NonHDL: 76.73
Total CHOL/HDL Ratio: 3
Triglycerides: 69 mg/dL (ref 0.0–149.0)
VLDL: 13.8 mg/dL (ref 0.0–40.0)

## 2023-09-09 LAB — PSA: PSA: 4.34 ng/mL — ABNORMAL HIGH (ref 0.10–4.00)

## 2023-09-09 LAB — T4, FREE: Free T4: 1.05 ng/dL (ref 0.60–1.60)

## 2023-09-09 LAB — TSH: TSH: 5.96 u[IU]/mL — ABNORMAL HIGH (ref 0.35–5.50)

## 2023-09-09 MED ORDER — PANTOPRAZOLE SODIUM 20 MG PO TBEC
20.0000 mg | DELAYED_RELEASE_TABLET | Freq: Every day | ORAL | 1 refills | Status: DC
Start: 2023-09-09 — End: 2024-03-17

## 2023-09-09 MED ORDER — ATORVASTATIN CALCIUM 80 MG PO TABS: 80.0000 mg | ORAL_TABLET | Freq: Every day | ORAL | 3 refills | Status: AC

## 2023-09-09 MED ORDER — SYNTHROID 88 MCG PO TABS
88.0000 ug | ORAL_TABLET | Freq: Every day | ORAL | 1 refills | Status: DC
Start: 2023-09-09 — End: 2024-02-26

## 2023-09-09 MED ORDER — TAMSULOSIN HCL 0.4 MG PO CAPS: 0.8000 mg | ORAL_CAPSULE | Freq: Every day | ORAL | 1 refills | Status: DC

## 2023-09-09 MED ORDER — ESCITALOPRAM OXALATE 5 MG PO TABS
5.0000 mg | ORAL_TABLET | Freq: Every day | ORAL | 1 refills | Status: DC
Start: 2023-09-09 — End: 2024-04-07

## 2023-09-09 NOTE — Assessment & Plan Note (Signed)
Chronic pt reports his sister passed in January, as well as several other deaths b/w friends & family taking Lexapro 5mg , still helping sending refill today f/u 6 mos

## 2023-09-09 NOTE — Patient Instructions (Addendum)
 [name] , Thank you for taking time to come for your Medicare Wellness Visit. I appreciate your ongoing commitment to your health goals. Please review the following plan we discussed and let me know if I can assist you in the future.   Referrals/Orders/Follow-Ups/Clinician Recommendations: Aim for 30 minutes of exercise or brisk walking, 6-8 glasses of water, and 5 servings of fruits and vegetables each day. Work on losing 25 lbs by the end of 2025   This is a list of the screening recommended for you and due dates:  Health Maintenance  Topic Date Due   COVID-19 Vaccine (3 - Pfizer risk series) 10/15/2019   Flu Shot  03/06/2023   Medicare Annual Wellness Visit  09/08/2024   DTaP/Tdap/Td vaccine (3 - Td or Tdap) 09/04/2027   Pneumonia Vaccine  Completed   Hepatitis C Screening  Completed   Zoster (Shingles) Vaccine  Completed   HPV Vaccine  Aged Out   Colon Cancer Screening  Discontinued    Advanced directives: (In Chart) A copy of your advanced directives are scanned into your chart should your provider ever need it.  Next Medicare Annual Wellness Visit scheduled for next year: Yes

## 2023-09-09 NOTE — Assessment & Plan Note (Signed)
Pt symptoms controlled on Flomax 0.8mg  qd. -Continue Flomax daily, sending refill. -Check PSA today. -F/U in 1 yr

## 2023-09-09 NOTE — Assessment & Plan Note (Signed)
Regular bowel movements with use of fiber and stool softener. -No changes to current management.  -Continue Protonix daily, refill sent. -F/U in 6 mos

## 2023-09-09 NOTE — Progress Notes (Signed)
 Patient ID: Ryan [name], male    DOB: 09/13/1945, 78 y.o.   MRN: [id number]  Chief Complaint  Patient presents with   Anxiety       History of Present Illness   Ryan [name] is a 78 year old male who presents for a follow-up visit and lab work.  He has been on Synthroid  for years and previously switched to a generic version, which was not effective according to his previous doctor. Recently, he received a notice from [facility] about a significant price increase and a requirement for a physician's note to continue with the brand name Synthroid . The cost has increased from $90 for a 90-day supply to $150 for a 30-day supply, causing concern about the cost and the need for a prior authorization. He is also taking Lexapro  5mg , Lipitor  80 mg, Protonix  20mg  for stomach acid, and tamsulosin  0.8mg  for bladder issues. He has experienced a recent weight gain of about 10 pounds, which he attributes to a difficult year, including the passing of his sister in January and a friend entering hospice care. He wants to manage his weight and is considering dietary changes and increased physical activity. He noticed an increase in his blood pressure reading today. Previous readings in normal range. He reports drinking plenty of water but does eat out frequently. He denies any headaches, dizziness, or flushed feelings associated with high blood pressure. He has a monitor for blood pressure at home.     Assessment & Plan:     Hypothyroidism - Stable on Synthroid  88mcg qd.  Noted increase in cost and need for prior authorization for brand name medication. -Submit prior authorization for brand name Synthroid . -Check thyroid  function tests today. -F/U in 1 year  Weight Gain/Obesity  - Noted 9 pound weight gain since last visit. Discussed potential impact on blood pressure and overall health. -Encouraged dietary modifications, increased water intake, and regular exercise.  Elevated blood pressure - Blood  pressure elevated today, but variable. No symptoms of hypertension reported. -Advised patient to monitor blood pressure at home and report if consistently over 140 systolic. -Consider initiation of antihypertensive medication if blood pressure remains elevated.  Hyperlipidemia - -Check lipid panel today. -Continue Lipitor  80mg  qd, sending refill. -F/U in 1 year  BPH -  Pt symptoms controlled on Flomax  0.8mg  qd. -Continue Flomax  daily, sending refill. -Check PSA today. -F/U in 1 yr  Situational anxiety -  Chronic pt reports his sister passed in January, as well as several other deaths b/w friends & family taking Lexapro  5mg , still helping sending refill today f/u 6 mos  GERD - Regular bowel movements with use of fiber and stool softener. -No changes to current management.  -Continue Protonix  daily 20mg  qd, refill sent. -F/U in 6 mos     Subjective:    Outpatient Medications Prior to Visit  Medication Sig Dispense Refill   albuterol  (VENTOLIN  HFA) 108 (90 Base) MCG/ACT inhaler Inhale 2 puffs into the lungs every 4 (four) hours as needed for wheezing. 1 each 2   ALPRAZolam  (XANAX ) 0.25 MG tablet Take 1 tablet by mouth 2 (two) times daily as needed.     aspirin  EC 81 MG EC tablet Take 1 tablet (81 mg total) by mouth daily. 30 tablet 11   atorvastatin  (LIPITOR ) 80 MG tablet Take 1 tablet (80 mg total) by mouth daily. 90 tablet 3   bisacodyl  (DULCOLAX) 5 MG EC tablet Take 5 mg by mouth daily as needed for moderate constipation.  Coenzyme Q10 200 MG capsule Take 200 mg by mouth daily.     cyclobenzaprine  (FLEXERIL ) 5 MG tablet Take 1 tablet (5 mg total) by mouth 3 (three) times daily as needed for muscle spasms. 50 tablet 0   escitalopram  (LEXAPRO ) 5 MG tablet TAKE 1 TABLET (5 MG TOTAL) BY MOUTH DAILY. 90 tablet 1   fluorouracil (EFUDEX) 5 % cream Apply 1 application. topically daily as needed (skin cancer).     neomycin-polymyxin b-dexamethasone  (MAXITROL) 3.5-10000-0.1 OINT  Place into the right eye.     nitroGLYCERIN  (NITROSTAT ) 0.4 MG SL tablet Dissolve 1 tablet under the tongue every 5 minutes as needed for chest pain. Max of 3 doses, then 911. 25 tablet 5   pantoprazole  (PROTONIX ) 20 MG tablet TAKE 1 TABLET BY MOUTH EVERY DAY 90 tablet 1   polyethylene glycol powder (GLYCOLAX /MIRALAX ) powder Take 17 g by mouth as needed for mild constipation.      SYNTHROID  88 MCG tablet TAKE 1 TABLET BY MOUTH EVERY DAY 90 tablet 1   tamsulosin  (FLOMAX ) 0.4 MG CAPS capsule TAKE 2 CAPSULES BY MOUTH EVERY DAY 180 capsule 0   No facility-administered medications prior to visit.   Past Medical History:  Diagnosis Date   Allergy    seasonal   Anxiety    Arthritis    Asthma    Cataract    Coronary artery disease 12/08/2016   STEMI, DES Circumflex   GERD (gastroesophageal reflux disease)    Hyperlipidemia    Hypothyroidism    STEMI (ST elevation myocardial infarction) (HCC) 12/08/2016   Past Surgical History:  Procedure Laterality Date   cataract Bilateral 03/2016   COLONOSCOPY  05/31/2004   COLONOSCOPY  06/30/2018   epidural steroid shot     x 3 , lumbar spine area   EYE SURGERY     bilateral cataracts   LEFT HEART CATH AND CORONARY ANGIOGRAPHY N/A 12/08/2016   Procedure: Left Heart Cath and Coronary Angiography;  Surgeon: Wonda Sharper, MD;  Location: [facility] INVASIVE CV LAB;  Service: Cardiovascular;  Laterality: N/A;   LUMBAR LAMINECTOMY/DECOMPRESSION MICRODISCECTOMY Bilateral 01/21/2022   Procedure: Lumbar Two-Three, Lumbar Three-Four/Lumbar Four-Five LAMINOTOMY/FORAMINOTOMY;  Surgeon: Mavis Purchase, MD;  Location: [facility] OR;  Service: Neurosurgery;  Laterality: Bilateral;  3C   POLYPECTOMY     SHOULDER SURGERY Right    Rotator Cuff Tear Repair   TONSILLECTOMY     UPPER GASTROINTESTINAL ENDOSCOPY     Allergies  Allergen Reactions   Contrast Media [Iodinated Contrast Media] Shortness Of Breath and Swelling    Swelling of the throat    Erythromycin Nausea And  Vomiting   Niacin And Related Swelling   Penicillins Hives    Has patient had a PCN reaction causing immediate rash, facial/tongue/throat swelling, SOB or lightheadedness with hypotension: Y Has patient had a PCN reaction causing severe rash involving mucus membranes or skin necrosis: No Has patient had a PCN reaction that required hospitalization: No Has patient had a PCN reaction occurring within the last 10 years: No If all of the above answers are NO, then may proceed with Cephalosporin use.   Gabapentin Swelling    Severe swelling in legs   Gadolinium Derivatives     Other Reaction(s): difficulty breathing   Penicillin G     Other Reaction(s): rash, nausea      Objective:    Physical Exam Vitals and nursing note reviewed.  Constitutional:      General: He is not in acute distress.    Appearance:  Normal appearance.  HENT:     Head: Normocephalic.     Right Ear: Tympanic membrane and external ear normal.     Left Ear: Tympanic membrane and external ear normal.     Nose: Nose normal.     Mouth/Throat:     Mouth: Mucous membranes are moist.  Eyes:     Extraocular Movements: Extraocular movements intact.  Cardiovascular:     Rate and Rhythm: Normal rate and regular rhythm.  Pulmonary:     Effort: Pulmonary effort is normal.     Breath sounds: Normal breath sounds.  Abdominal:     General: Abdomen is flat. There is no distension.     Palpations: Abdomen is soft.     Tenderness: There is no abdominal tenderness.  Musculoskeletal:        General: Normal range of motion.     Cervical back: Normal range of motion.  Skin:    General: Skin is warm and dry.  Neurological:     Mental Status: He is alert and oriented to person, place, and time.  Psychiatric:        Mood and Affect: Mood normal.        Behavior: Behavior normal.        Judgment: Judgment normal.   BP (!) 147/79 (BP Location: Left Arm, Patient Position: Sitting, Cuff Size: Large)   Pulse 60   Temp 97.7 F  (36.5 C) (Temporal)   Ht 6' (1.829 m)   Wt 227 lb 3.2 oz (103.1 kg)   SpO2 96%   BMI 30.81 kg/m  Wt Readings from Last 3 Encounters:  09/09/23 227 lb 3.2 oz (103.1 kg)  09/09/23 218 lb (98.9 kg)  03/26/23 218 lb 9.6 oz (99.2 kg)      Ryan Krabbe, NP

## 2023-09-09 NOTE — Patient Instructions (Signed)
 It was very nice to see you today!   I will review your lab results via MyChart in a few days.  I have sent over your medication refills.  See the attached handout and start working on what we discussed today regarding your weight loss goals:  1) Eat small portions, ok to eat 6 mini meals vs. 3 big meals if this controls your hunger  better. 2) Eat until full and then STOP! This is your body telling you it has had enough. 3) Drink at least 64 oz water = 2 liters = 8, 8oz cups DAILY. 4) Eat most of your calories earlier in the day (if you work during the day).  Want to eat within a 8-10hour window if possible. No eating after 7pm, only no calorie drinks or water from 7p until bedtime. 5) Look for low carb, high protein foods/recipes. Avoid simple carbs including sweets, white bread, rice, potatoes, pasta. Look for whole grain/whole wheat/or almond flour if eating these foods. 6) High protein foods: meats (limit red meat), including turkey, chicken, pork, FISH (best source) nuts, cheese, beans (black beans, soy/edamame beans are best). Protein drinks, bars are also good but must have low sugar, look for < 10 grams. 7) Avoid processed/refined sugar and artificial sweeteners if possible. Stevia, Erythritol, or Monk fruit sweetener are preferred, natural, no calorie sweeteners.  Natural sweeteners like Agave or honey in very small amounts are ok also.      PLEASE NOTE:  If you had any lab tests please let us  know if you have not heard back within a few days. You may see your results on MyChart before we have a chance to review them but we will give you a call once they are reviewed by us . If we ordered any referrals today, please let us  know if you have not heard from their office within the next week.

## 2023-09-09 NOTE — Assessment & Plan Note (Signed)
-  Check lipid panel today. -Continue Lipitor 80mg  qd, sending refill. -F/U in 1 year

## 2023-09-09 NOTE — Assessment & Plan Note (Signed)
Noted 9 pound weight gain since last visit. Discussed potential impact on blood pressure and overall health. -Encouraged dietary modifications, increased water intake, and regular exercise.

## 2023-09-09 NOTE — Assessment & Plan Note (Signed)
Stable on Synthroid qd.  Noted increase in cost and need for prior authorization for brand name medication. -Submit prior authorization for brand name Synthroid. -Check thyroid function tests today. -F/U in 1 year

## 2023-09-09 NOTE — Telephone Encounter (Signed)
 Pharmacy Patient Advocate Encounter   Received notification from Physician's Office that prior authorization for Synthroid  is required/requested.   Insurance verification completed.   The patient is insured through [facility] .   Per test claim: PA required; PA submitted to above mentioned insurance via CoverMyMeds Key/confirmation #/EOC [id number] Status is pending

## 2023-09-09 NOTE — Progress Notes (Signed)
 Subjective:   Ryan [name] is a 78 y.o. male who presents for Medicare Annual/Subsequent preventive examination.  Visit Complete: Virtual I connected with  Ryan [name] on 09/09/23 by a audio enabled telemedicine application and verified that I am speaking with the correct person using two identifiers.  Patient Location: Home  Provider Location: Office/Clinic  I discussed the limitations of evaluation and management by telemedicine. The patient expressed understanding and agreed to proceed.  Vital Signs: Because this visit was a virtual/telehealth visit, some criteria may be missing or patient reported. Any vitals not documented were not able to be obtained and vitals that have been documented are patient reported.   Cardiac Risk Factors include: advanced age (>63men, >12 women);dyslipidemia;male gender     Objective:    Today's Vitals   09/09/23 0819  Weight: 218 lb (98.9 kg)   Body mass index is 29.57 kg/m.     09/09/2023    8:24 AM 07/15/2022    9:25 AM 01/14/2022   10:16 AM 07/06/2021    9:42 AM 03/05/2017   10:39 AM 01/16/2017    3:49 PM 12/16/2016    3:48 PM  Advanced Directives  Does Patient Have a Medical Advance Directive? Yes Yes Yes Yes Yes Yes No  Type of Estate Agent of [place];Living will Healthcare Power of [place];Living will Healthcare Power of [place];Living will Healthcare Power of [place] of [place];Living will    Does patient want to make changes to medical advance directive? No - Patient declined No - Patient declined   No - Patient declined No - Patient declined   Copy of Healthcare Power of Attorney in Chart? Yes - validated most recent copy scanned in chart (See row information) Yes - validated most recent copy scanned in chart (See row information)  Yes - validated most recent copy scanned in chart (See row information) No - copy requested    Would patient like information on creating a medical advance  directive?       No - Patient declined    Current Medications (verified) Outpatient Encounter Medications as of 09/09/2023  Medication Sig   albuterol  (VENTOLIN  HFA) 108 (90 Base) MCG/ACT inhaler Inhale 2 puffs into the lungs every 4 (four) hours as needed for wheezing.   ALPRAZolam  (XANAX ) 0.25 MG tablet Take 1 tablet by mouth 2 (two) times daily as needed.   aspirin  EC 81 MG EC tablet Take 1 tablet (81 mg total) by mouth daily.   atorvastatin  (LIPITOR ) 80 MG tablet Take 1 tablet (80 mg total) by mouth daily.   bisacodyl  (DULCOLAX) 5 MG EC tablet Take 5 mg by mouth daily as needed for moderate constipation.   Coenzyme Q10 200 MG capsule Take 200 mg by mouth daily.   cyclobenzaprine  (FLEXERIL ) 5 MG tablet Take 1 tablet (5 mg total) by mouth 3 (three) times daily as needed for muscle spasms.   escitalopram  (LEXAPRO ) 5 MG tablet TAKE 1 TABLET (5 MG TOTAL) BY MOUTH DAILY.   fluorouracil (EFUDEX) 5 % cream Apply 1 application. topically daily as needed (skin cancer).   neomycin-polymyxin b-dexamethasone  (MAXITROL) 3.5-10000-0.1 OINT Place into the right eye.   nitroGLYCERIN  (NITROSTAT ) 0.4 MG SL tablet Dissolve 1 tablet under the tongue every 5 minutes as needed for chest pain. Max of 3 doses, then 911.   pantoprazole  (PROTONIX ) 20 MG tablet TAKE 1 TABLET BY MOUTH EVERY DAY   polyethylene glycol powder (GLYCOLAX /MIRALAX ) powder Take 17 g by mouth as needed for mild  constipation.    SYNTHROID  88 MCG tablet TAKE 1 TABLET BY MOUTH EVERY DAY   tamsulosin  (FLOMAX ) 0.4 MG CAPS capsule TAKE 2 CAPSULES BY MOUTH EVERY DAY   [DISCONTINUED] traMADol (ULTRAM) 50 MG tablet Take 50 mg by mouth every 6 (six) hours as needed for moderate pain.   No facility-administered encounter medications on file as of 09/09/2023.    Allergies (verified) Contrast media [iodinated contrast media], Erythromycin, Niacin and related, Penicillins, Gabapentin, Gadolinium derivatives, and Penicillin g   History: Past Medical  History:  Diagnosis Date   Allergy    seasonal   Anxiety    Arthritis    Asthma    Cataract    Coronary artery disease 12/08/2016   STEMI, DES Circumflex   GERD (gastroesophageal reflux disease)    Hyperlipidemia    Hypothyroidism    STEMI (ST elevation myocardial infarction) (HCC) 12/08/2016   Past Surgical History:  Procedure Laterality Date   cataract Bilateral 03/2016   COLONOSCOPY  05/31/2004   COLONOSCOPY  06/30/2018   epidural steroid shot     x 3 , lumbar spine area   EYE SURGERY     bilateral cataracts   LEFT HEART CATH AND CORONARY ANGIOGRAPHY N/A 12/08/2016   Procedure: Left Heart Cath and Coronary Angiography;  Surgeon: Wonda Sharper, MD;  Location: [facility] INVASIVE CV LAB;  Service: Cardiovascular;  Laterality: N/A;   LUMBAR LAMINECTOMY/DECOMPRESSION MICRODISCECTOMY Bilateral 01/21/2022   Procedure: Lumbar Two-Three, Lumbar Three-Four/Lumbar Four-Five LAMINOTOMY/FORAMINOTOMY;  Surgeon: Mavis Purchase, MD;  Location: [facility] OR;  Service: Neurosurgery;  Laterality: Bilateral;  3C   POLYPECTOMY     SHOULDER SURGERY Right    Rotator Cuff Tear Repair   TONSILLECTOMY     UPPER GASTROINTESTINAL ENDOSCOPY     Family History  Problem Relation Age of Onset   Diabetes Mother    Heart failure Mother    Irritable bowel syndrome Mother    Diverticulosis Mother    Lung cancer Father        worked in futures trader yard   Heart disease Sister    Irritable bowel syndrome Sister    Diverticulosis Sister    Colon cancer Neg Hx    Esophageal cancer Neg Hx    Pancreatic cancer Neg Hx    Stomach cancer Neg Hx    Social History   Socioeconomic History   Marital status: Married    Spouse name: Not on file   Number of children: 2   Years of education: Not on file   Highest education level: Not on file  Occupational History   Occupation: retired  Tobacco Use   Smoking status: Never   Smokeless tobacco: Never  Vaping Use   Vaping status: Never Used  Substance and Sexual  Activity   Alcohol use: Yes    Alcohol/week: 3.0 standard drinks of alcohol    Types: 3 Glasses of wine per week    Comment: occasional   Drug use: No   Sexual activity: Not on file  Other Topics Concern   Not on file  Social History Narrative   Not on file   Social Drivers of Health   Financial Resource Strain: Low Risk  (09/09/2023)   Overall Financial Resource Strain (CARDIA)    Difficulty of Paying Living Expenses: Not hard at all  Food Insecurity: No Food Insecurity (09/09/2023)   Hunger Vital Sign    Worried About Running Out of Food in the Last Year: Never true    Ran Out of Food  in the Last Year: Never true  Transportation Needs: No Transportation Needs (09/09/2023)   PRAPARE - Administrator, Civil Service (Medical): No    Lack of Transportation (Non-Medical): No  Physical Activity: Inactive (09/09/2023)   Exercise Vital Sign    Days of Exercise per Week: 0 days    Minutes of Exercise per Session: 0 min  Stress: No Stress Concern Present (09/09/2023)   [employer] of Occupational Health - Occupational Stress Questionnaire    Feeling of Stress : Only a little  Social Connections: Moderately Integrated (09/09/2023)   Social Connection and Isolation Panel [NHANES]    Frequency of Communication with Friends and Family: More than three times a week    Frequency of Social Gatherings with Friends and Family: More than three times a week    Attends Religious Services: More than 4 times per year    Active Member of [employer] or Organizations: No    Attends Engineer, Structural: Never    Marital Status: Married    Tobacco Counseling Counseling given: Not Answered   Clinical Intake:  Pre-visit preparation completed: Yes  Pain : No/denies pain     BMI - recorded: 29.57 Nutritional Status: BMI 25 -29 Overweight Diabetes: No  How often do you need to have someone help you when you read instructions, pamphlets, or other written materials from your doctor  or pharmacy?: 1 - Never  Interpreter Needed?: No  Information entered by :: Ellouise Haws, LPN   Activities of Daily Living    09/09/2023    8:37 AM  In your present state of health, do you have any difficulty performing the following activities:  Hearing? 0  Vision? 0  Difficulty concentrating or making decisions? 0  Walking or climbing stairs? 0  Dressing or bathing? 0  Doing errands, shopping? 0  Preparing Food and eating ? N  Using the Toilet? N  In the past six months, have you accidently leaked urine? N  Do you have problems with loss of bowel control? N  Managing your Medications? N  Managing your Finances? N  Housekeeping or managing your Housekeeping? N    Patient Care Team: Lucius Krabbe, NP as PCP - General (Family Medicine) Wonda Sharper, MD as PCP - Cardiology (Cardiology) Nicholaus Sherlean CROME, [facility] (Inactive) as Pharmacist (Pharmacist)  Indicate any recent Medical Services you may have received from other than [facility] providers in the past year (date may be approximate).     Assessment:   This is a routine wellness examination for Ryan [name].  Hearing/Vision screen Hearing Screening - Comments:: Pt denies any hearing issues  Vision Screening - Comments:: Pt follows up with Dr Octavia for annual eye exams, follows up with [facility] for eye concerns as well    Goals Addressed             This Visit's Progress    Patient Stated       Lose 25 lbs by end the year       Depression Screen    09/09/2023    8:25 AM 02/11/2023    1:25 PM 12/04/2022    9:11 AM 09/05/2022    8:45 AM 08/19/2022    9:39 AM 07/15/2022    9:25 AM 09/04/2021    9:05 AM  PHQ 2/9 Scores  PHQ - 2 Score 0 0 0 1 3 0 0  PHQ- 9 Score  7 0 2 8      Fall Risk  09/09/2023    8:29 AM 02/11/2023    1:24 PM 09/05/2022    8:45 AM 07/15/2022    9:26 AM 09/04/2021    9:05 AM  Fall Risk   Falls in the past year? 0 0 1 1 0  Number falls in past yr: 0 0 0 1 1  Injury with Fall? 0 0 0 0 0  Risk for  fall due to : Impaired balance/gait No Fall Risks No Fall Risks Impaired vision   Risk for fall due to: Comment with back issues      Follow up Falls prevention discussed Falls evaluation completed Falls evaluation completed;Education provided Falls prevention discussed     MEDICARE RISK AT HOME: Medicare Risk at Home Any stairs in or around the home?: Yes If so, are there any without handrails?: No Home free of loose throw rugs in walkways, pet beds, electrical cords, etc?: Yes Adequate lighting in your home to reduce risk of falls?: Yes Life alert?: No Use of a cane, walker or w/c?: No Grab bars in the bathroom?: Yes Shower chair or bench in shower?: Yes Elevated toilet seat or a handicapped toilet?: Yes  TIMED UP AND GO:  Was the test performed?  No    Cognitive Function:        09/09/2023    8:30 AM 07/15/2022    9:27 AM 07/06/2021    9:48 AM  6CIT Screen  What Year? 0 points 0 points 0 points  What month? 0 points 0 points 0 points  What time? 0 points 0 points 0 points  Count back from 20 0 points 0 points 0 points  Months in reverse 0 points 4 points 0 points  Repeat phrase 0 points 2 points 4 points  Total Score 0 points 6 points 4 points    Immunizations Immunization History  Administered Date(s) Administered   Influenza Split 04/29/2012   Influenza Whole 05/05/2009   Influenza, High Dose Seasonal PF 05/13/2013, 04/19/2019   Influenza-Unspecified 05/03/2014, 05/26/2015, 04/16/2016, 06/09/2023   PFIZER(Purple Top)SARS-COV-2 Vaccination 08/27/2019, 09/17/2019   Pneumococcal Conjugate-13 06/05/2015   Pneumococcal Polysaccharide-23 01/24/2010, 08/28/2016   Td 08/05/2005   Tdap 09/03/2017   Zoster Recombinant(Shingrix) 03/22/2017, 07/22/2017   Zoster, Live 05/28/2013    TDAP status: Up to date  Flu Vaccine status: Up to date  Pneumococcal vaccine status: Up to date  Covid-19 vaccine status: Information provided on how to obtain vaccines.   Qualifies for  Shingles Vaccine? Yes   Zostavax completed Yes   Shingrix Completed?: Yes  Screening Tests Health Maintenance  Topic Date Due   COVID-19 Vaccine (3 - Pfizer risk series) 10/15/2019   Medicare Annual Wellness (AWV)  09/08/2024   DTaP/Tdap/Td (3 - Td or Tdap) 09/04/2027   Pneumonia Vaccine 4+ Years old  Completed   INFLUENZA VACCINE  Completed   Hepatitis C Screening  Completed   Zoster Vaccines- Shingrix  Completed   HPV VACCINES  Aged Out   Colonoscopy  Discontinued    Health Maintenance  Health Maintenance Due  Topic Date Due   COVID-19 Vaccine (3 - Pfizer risk series) 10/15/2019    Colorectal cancer screening: No longer required.   Additional Screening:  Hepatitis C Screening:  Completed 09/20/19  Vision Screening: Recommended annual ophthalmology exams for early detection of glaucoma and other disorders of the eye. Is the patient up to date with their annual eye exam?  Yes  Who is the provider or what is the name of the office in which  the patient attends annual eye exams? Dr Octavia and [facility]  If pt is not established with a provider, would they like to be referred to a provider to establish care? No .   Dental Screening: Recommended annual dental exams for proper oral hygiene   Community Resource Referral / Chronic Care Management: CRR required this visit?  No   CCM required this visit?  No     Plan:     I have personally reviewed and noted the following in the patient's chart:   Medical and social history Use of alcohol, tobacco or illicit drugs  Current medications and supplements including opioid prescriptions. Patient is not currently taking opioid prescriptions. Functional ability and status Nutritional status Physical activity Advanced directives List of other physicians Hospitalizations, surgeries, and ER visits in previous 12 months Vitals Screenings to include cognitive, depression, and falls Referrals and appointments  In addition,  I have reviewed and discussed with patient certain preventive protocols, quality metrics, and best practice recommendations. A written personalized care plan for preventive services as well as general preventive health recommendations were provided to patient.     Ellouise VEAR Haws, LPN   02/06/7973   After Visit Summary: (MyChart) Due to this being a telephonic visit, the after visit summary with patients personalized plan was offered to patient via MyChart   Nurse Notes: none

## 2023-09-10 ENCOUNTER — Encounter: Payer: Self-pay | Admitting: Family

## 2023-09-11 ENCOUNTER — Telehealth: Payer: Self-pay

## 2023-09-11 ENCOUNTER — Other Ambulatory Visit (HOSPITAL_COMMUNITY): Payer: Self-pay

## 2023-09-11 NOTE — Telephone Encounter (Signed)
 error

## 2023-09-11 NOTE — Telephone Encounter (Signed)
 Pharmacy Patient Advocate Encounter  Received notification from [facility] that Prior Authorization for Synthroid  has been APPROVED from 09/09/23 to 09/08/24   PA #/Case ID/Reference #: [id number]  Left a voice message at [facility] to notify of the approval

## 2023-09-11 NOTE — Telephone Encounter (Signed)
 FYI,  I called pt in regards to approval, pt verbalized understanding.

## 2023-09-12 ENCOUNTER — Other Ambulatory Visit (HOSPITAL_COMMUNITY): Payer: Self-pay

## 2023-09-17 ENCOUNTER — Telehealth: Payer: Self-pay

## 2023-09-17 NOTE — Telephone Encounter (Signed)
I called pt and LVM in regards to letter pt mailed to [place]. I let pt know Synthroid medication has already been approved until 09/08/2024, so pt can disregard letter that was sent by insurance.

## 2023-12-22 ENCOUNTER — Ambulatory Visit: Payer: Self-pay

## 2023-12-22 ENCOUNTER — Ambulatory Visit: Admitting: Family

## 2023-12-22 VITALS — BP 152/90 | HR 63 | Temp 97.3°F | Ht 72.0 in | Wt 220.4 lb

## 2023-12-22 DIAGNOSIS — J069 Acute upper respiratory infection, unspecified: Secondary | ICD-10-CM | POA: Diagnosis not present

## 2023-12-22 DIAGNOSIS — R030 Elevated blood-pressure reading, without diagnosis of hypertension: Secondary | ICD-10-CM

## 2023-12-22 MED ORDER — METHYLPREDNISOLONE ACETATE 40 MG/ML IJ SUSP
60.0000 mg | Freq: Once | INTRAMUSCULAR | Status: AC
  Administered 2023-12-22: 60 mg via INTRAMUSCULAR

## 2023-12-22 MED ORDER — HYDROCODONE BIT-HOMATROP MBR 5-1.5 MG/5ML PO SOLN: 5.0000 mL | Freq: Four times a day (QID) | ORAL | 0 refills | Status: DC | PRN

## 2023-12-22 NOTE — Telephone Encounter (Signed)
 Appt today

## 2023-12-22 NOTE — Progress Notes (Signed)
 Patient ID: Ryan [name], male    DOB: [birth year], 78 y.o.   MRN: [id number]  Chief Complaint  Patient presents with   Cough    Pt c/o Cough, slight fever of 100.0 and wheezing, Present since last Thursday. Has tried Asprin, mucinex and hydromet which does help sx.   Discussed the use of AI scribe software for clinical note transcription with the patient, who gave verbal consent to proceed.  History of Present Illness Ryan [name] "Ryan [name]" is a 78 year old male who presents with a persistent cough and fever.  He has a persistent cough that began after attending a funeral where another attendee was coughing. The cough is severe, causing soreness, and produces yellow-green sputum. He uses a cough syrup with hydrocodone  at night and took Mucinex on Friday to help with mucus.  He has a low-grade fever, which was 66F last night and increased to 100F today. Aspirin  was taken to reduce the fever. He also has a sore throat and uses lozenges for relief. He drinks orange juice to stay hydrated.  His blood pressure seemed elevated, though he has never been on hypertension medication. He has no recent allergy symptoms and is not taking allergy medications. He is allergic to penicillin but tolerates prednisone .  Assessment & Plan Acute viral upper respiratory infection Suspected viral infection with cough, fever, sore throat, and yellow-green sputum for last few days. Symptomatic treatment initiated. Mild rhonchi noted on exam. - Administered Depo Medrol  60mg  injection. Noted potential sleep disturbance tonight and slight increase in blood pressure. - Refilled hydromet cough syrup. Instructed to take 1 teaspoon during day 2 tsp qhs prn. - Advised use of cough lozenges prn. - Encouraged increased water intake, 2L qd and vitamin C supplementation vs juice. - Advised 400mg  ibuprofen or 1-2 generic Aleve for fever, CP r/t coughing. - Instructed to call if symptoms persist or fever continues for  possible antibiotic consideration on Friday.  Elevated blood pressure Elevated blood pressure possibly related to illness and stress. No prior hypertension treatment. - Monitor blood pressure at home. Report if elevated. - Advised reduction of salt intake and increased water consumption. - Plan to recheck blood pressure in a couple of weeks.        Subjective:     Outpatient Medications Prior to Visit  Medication Sig Dispense Refill   albuterol  (VENTOLIN  HFA) 108 (90 Base) MCG/ACT inhaler Inhale 2 puffs into the lungs every 4 (four) hours as needed for wheezing. 1 each 2   ALPRAZolam  (XANAX ) 0.25 MG tablet Take 1 tablet by mouth 2 (two) times daily as needed.     aspirin  EC 81 MG EC tablet Take 1 tablet (81 mg total) by mouth daily. 30 tablet 11   atorvastatin  (LIPITOR ) 80 MG tablet Take 1 tablet (80 mg total) by mouth daily. 90 tablet 3   bisacodyl  (DULCOLAX) 5 MG EC tablet Take 5 mg by mouth daily as needed for moderate constipation.     Coenzyme Q10 200 MG capsule Take 200 mg by mouth daily.     cyclobenzaprine  (FLEXERIL ) 5 MG tablet Take 1 tablet (5 mg total) by mouth 3 (three) times daily as needed for muscle spasms. 50 tablet 0   escitalopram  (LEXAPRO ) 5 MG tablet Take 1 tablet (5 mg total) by mouth daily. 90 tablet 1   nitroGLYCERIN  (NITROSTAT ) 0.4 MG SL tablet Dissolve 1 tablet under the tongue every 5 minutes as needed for chest pain. Max of 3 doses, then 911. 25  tablet 5   pantoprazole  (PROTONIX ) 20 MG tablet Take 1 tablet (20 mg total) by mouth daily. 90 tablet 1   polyethylene glycol powder (GLYCOLAX /MIRALAX ) powder Take 17 g by mouth as needed for mild constipation.      SYNTHROID  88 MCG tablet Take 1 tablet (88 mcg total) by mouth daily. 90 tablet 1   tamsulosin  (FLOMAX ) 0.4 MG CAPS capsule Take 2 capsules (0.8 mg total) by mouth daily. 180 capsule 1   fluorouracil (EFUDEX) 5 % cream Apply 1 application. topically daily as needed (skin cancer).     neomycin-polymyxin  b-dexamethasone  (MAXITROL) 3.5-10000-0.1 OINT Place into the right eye.     No facility-administered medications prior to visit.   Past Medical History:  Diagnosis Date   Allergy    seasonal   Anxiety    Arthritis    Asthma    Cataract    Coronary artery disease 12/08/2016   STEMI, DES Circumflex   GERD (gastroesophageal reflux disease)    Hyperlipidemia    Hypothyroidism    STEMI (ST elevation myocardial infarction) (HCC) 12/08/2016   Past Surgical History:  Procedure Laterality Date   cataract Bilateral 03/2016   COLONOSCOPY  05/31/2004   COLONOSCOPY  06/30/2018   epidural steroid shot     x 3 , lumbar spine area   EYE SURGERY     bilateral cataracts   LEFT HEART CATH AND CORONARY ANGIOGRAPHY N/A 12/08/2016   Procedure: Left Heart Cath and Coronary Angiography;  Surgeon: Arnoldo Lapping, MD;  Location: [facility] INVASIVE CV LAB;  Service: Cardiovascular;  Laterality: N/A;   LUMBAR LAMINECTOMY/DECOMPRESSION MICRODISCECTOMY Bilateral 01/21/2022   Procedure: Lumbar Two-Three, Lumbar Three-Four/Lumbar Four-Five LAMINOTOMY/FORAMINOTOMY;  Surgeon: Garry Kansas, MD;  Location[facility] OR;  Service: Neurosurgery;  Laterality: Bilateral;  3C   POLYPECTOMY     SHOULDER SURGERY Right    Rotator Cuff Tear Repair   TONSILLECTOMY     UPPER GASTROINTESTINAL ENDOSCOPY     Allergies  Allergen Reactions   Contrast Media [Iodinated Contrast Media] Shortness Of Breath and Swelling    Swelling of the throat    Erythromycin Nausea And Vomiting   Niacin And Related Swelling   Penicillins Hives    Has patient had a PCN reaction causing immediate rash, facial/tongue/throat swelling, SOB or lightheadedness with hypotension: Y Has patient had a PCN reaction causing severe rash involving mucus membranes or skin necrosis: No Has patient had a PCN reaction that required hospitalization: No Has patient had a PCN reaction occurring within the last 10 years: No If all of the above answers are "NO", then may  proceed with Cephalosporin use.   Gabapentin Swelling    Severe swelling in legs   Gadolinium Derivatives     Other Reaction(s): difficulty breathing   Penicillin G     Other Reaction(s): rash, nausea      Objective:    Physical Exam Vitals and nursing note reviewed.  Constitutional:      General: He is not in acute distress.    Appearance: Normal appearance.  HENT:     Head: Normocephalic.  Cardiovascular:     Rate and Rhythm: Normal rate and regular rhythm.  Pulmonary:     Effort: Pulmonary effort is normal.     Breath sounds: Examination of the right-upper field reveals rhonchi. Examination of the left-upper field reveals rhonchi. Examination of the right-middle field reveals rhonchi. Examination of the left-middle field reveals rhonchi. Rhonchi present.  Musculoskeletal:        General: Normal  range of motion.     Cervical back: Normal range of motion.  Skin:    General: Skin is warm and dry.  Neurological:     Mental Status: He is alert and oriented to person, place, and time.  Psychiatric:        Mood and Affect: Mood normal.    BP (!) 152/90 (BP Location: Left Arm, Patient Position: Sitting, Cuff Size: Large)   Pulse 63   SpO2 95%  Wt Readings from Last 3 Encounters:  09/09/23 227 lb 3.2 oz (103.1 kg)  09/09/23 218 lb (98.9 kg)  03/26/23 218 lb 9.6 oz (99.2 kg)      Versa Gore, NP

## 2023-12-22 NOTE — Patient Instructions (Signed)
 It was very nice to see you today!   Given steroid injection to help sx. Refilled Hydromet cough syrup - 1 tsp during day, 2 tsp at bedtime as needed. Drink plenty of water. Continue Vitamin C and zinc  daily until feeling better to boost immune system. Call back Friday if sx are not improved.  Monitor blood pressure and sent message or call if continuing to run higher than 130/90 after feeling better.       PLEASE NOTE:  If you had any lab tests please let us  know if you have not heard back within a few days. You may see your results on MyChart before we have a chance to review them but we will give you a call once they are reviewed by us . If we ordered any referrals today, please let us  know if you have not heard from their office within the next week.

## 2023-12-22 NOTE — Telephone Encounter (Signed)
 Copied from CRM [id number]. Topic: Clinical - Red Word Triage >> Dec 22, 2023  7:52 AM Emylou G wrote: [facility] that prompted transfer to Nurse Triage: persistant cough.. in his throat.. low grade fever.. 99.9 or so  Chief Complaint: cough, wheezing, fever Symptoms: see above Frequency: constant Pertinent Negatives: Patient denies cp, sob, runny nose Disposition: [] ED /[] Urgent Care (no appt availability in office) / [x] Appointment(In office/virtual)/ []  [place] [facility]/ [] Home Care/ [] Refused Recommended Disposition /[] [place] [facility]/ []  Follow-up with PCP Additional Notes: apt made per protocol; care advice given, denies questions; instructed to go to ER if becomes worse.   Reason for Disposition  [1] MILD difficulty breathing (e.g., minimal/no SOB at rest, SOB with walking, pulse <100) AND [2] still present when not coughing  Answer Assessment - Initial Assessment Questions 1. ONSET: "When did the cough begin?"      Thursday am 2. SEVERITY: "How bad is the cough today?"      severe 3. SPUTUM: "Describe the color of your sputum" (none, dry cough; clear, white, yellow, green)     denies 4. HEMOPTYSIS: "Are you coughing up any blood?" If so ask: "How much?" (flecks, streaks, tablespoons, etc.)     denies 5. DIFFICULTY BREATHING: "Are you having difficulty breathing?" If Yes, ask: "How bad is it?" (e.g., mild, moderate, severe)    - MILD: No SOB at rest, mild SOB with walking, speaks normally in sentences, can lie down, no retractions, pulse < 100.    - MODERATE: SOB at rest, SOB with minimal exertion and prefers to sit, cannot lie down flat, speaks in phrases, mild retractions, audible wheezing, pulse 100-120.    - SEVERE: Very SOB at rest, speaks in single words, struggling to breathe, sitting hunched forward, retractions, pulse > 120      wheezing 6. FEVER: "Do you have a fever?" If Yes, ask: "What is your temperature, how was it measured, and when did it start?"      99.1 7. CARDIAC HISTORY: "Do you have any history of heart disease?" (e.g., heart attack, congestive heart failure)      Heart attack 8. LUNG HISTORY: "Do you have any history of lung disease?"  (e.g., pulmonary embolus, asthma, emphysema)     denies 9. PE RISK FACTORS: "Do you have a history of blood clots?" (or: recent major surgery, recent prolonged travel, bedridden)     denies 10. OTHER SYMPTOMS: "Do you have any other symptoms?" (e.g., runny nose, wheezing, chest pain)       Wheezing,  11. PREGNANCY: "Is there any chance you are pregnant?" "When was your last menstrual period?"       na 12. TRAVEL: "Have you traveled out of the country in the last month?" (e.g., travel history, exposures)       no  Protocols used: Cough - Acute Productive-A-AH

## 2024-01-30 ENCOUNTER — Ambulatory Visit (INDEPENDENT_AMBULATORY_CARE_PROVIDER_SITE_OTHER): Admitting: Family Medicine

## 2024-01-30 ENCOUNTER — Encounter: Payer: Self-pay | Admitting: Family Medicine

## 2024-01-30 VITALS — BP 122/72 | HR 72 | Temp 98.1°F | Resp 18 | Ht 72.0 in | Wt 213.5 lb

## 2024-01-30 DIAGNOSIS — R519 Headache, unspecified: Secondary | ICD-10-CM | POA: Diagnosis not present

## 2024-01-30 DIAGNOSIS — R509 Fever, unspecified: Secondary | ICD-10-CM

## 2024-01-30 DIAGNOSIS — R6883 Chills (without fever): Secondary | ICD-10-CM

## 2024-01-30 LAB — POCT RAPID STREP A (OFFICE): Rapid Strep A Screen: NEGATIVE

## 2024-01-30 LAB — POC COVID19 BINAXNOW: SARS Coronavirus 2 Ag: NEGATIVE

## 2024-01-30 MED ORDER — CEFDINIR 300 MG PO CAPS: 300.0000 mg | ORAL_CAPSULE | Freq: Two times a day (BID) | ORAL | 0 refills | Status: DC

## 2024-01-30 NOTE — Patient Instructions (Signed)
 Start the antibiotics Take fluids Take tylenol  if needed  Worse, ER  Stay home at least 24 hrs

## 2024-01-30 NOTE — Progress Notes (Unsigned)
 Subjective:     Patient ID: Ryan [name], male    DOB: 1945/10/21, 78 y.o.   MRN: [id number]  Chief Complaint  Patient presents with   Chills    Started this morning, woke up shivering, woke up from a nap around lunch time poured down in sweat   Fever    101, almost 102 this morning    Headache    HPI Discussed the use of AI scribe software for clinical note transcription with the patient, who gave verbal consent to proceed.  History of Present Illness Ryan [name] is a 78 year old male who presents with fever and generalized body aches.  He woke up at 6:30 AM with shivering and a fever of 101F, which nearly reached 102F. He took Tylenol  around 9 AM, which provided some relief. Later, he experienced significant sweating, soaking through multiple layers of clothing. He has generalized body aches, particularly in his back. No runny nose, congestion, cough, vomiting, or diarrhea.  He has been visiting the hospital daily since June 5th due to his wife's traumatic brain injury and subsequent surgery. His wife fell down 14 steps, resulting in a broken arm and a head injury, leading to a brain bleed. She was initially in ICU and is now in rehab, expected to be discharged soon.  He has a history of hypertension, typically experiencing high blood pressure, but noted an unusually low reading of 99/63 mmHg today, which is atypical for him. His heart rate was recorded at 72 bpm, higher than his usual rate in the 50s.  He is allergic to erythromycin and penicillin, with penicillin causing hives. He also cannot take gabapentin due to leg swelling. He has not taken a Z-Pak in a long time and does not recall his reaction to it.    There are no preventive care reminders to display for this patient.  Past Medical History:  Diagnosis Date   Allergy 1956   seasonal   Anxiety    Arthritis    Asthma    Cataract    Coronary artery disease 12/08/2016   STEMI, DES Circumflex   GERD  (gastroesophageal reflux disease)    Hyperlipidemia    Hypothyroidism    STEMI (ST elevation myocardial infarction) (HCC) 12/08/2016    Past Surgical History:  Procedure Laterality Date   cataract Bilateral 03/2016   COLONOSCOPY  05/31/2004   COLONOSCOPY  06/30/2018   epidural steroid shot     x 3 , lumbar spine area   EYE SURGERY  Cataracts   bilateral cataracts   LEFT HEART CATH AND CORONARY ANGIOGRAPHY N/A 12/08/2016   Procedure: Left Heart Cath and Coronary Angiography;  Surgeon: Wonda Sharper, MD;  Location: [facility] INVASIVE CV LAB;  Service: Cardiovascular;  Laterality: N/A;   LUMBAR LAMINECTOMY/DECOMPRESSION MICRODISCECTOMY Bilateral 01/21/2022   Procedure: Lumbar Two-Three, Lumbar Three-Four/Lumbar Four-Five LAMINOTOMY/FORAMINOTOMY;  Surgeon: Mavis Purchase, MD;  Location: [facility] OR;  Service: Neurosurgery;  Laterality: Bilateral;  3C   POLYPECTOMY     SHOULDER SURGERY Right    Rotator Cuff Tear Repair   SPINE SURGERY     TONSILLECTOMY     UPPER GASTROINTESTINAL ENDOSCOPY       Current Outpatient Medications:    albuterol  (VENTOLIN  HFA) 108 (90 Base) MCG/ACT inhaler, Inhale 2 puffs into the lungs every 4 (four) hours as needed for wheezing., Disp: 1 each, Rfl: 2   ALPRAZolam  (XANAX ) 0.25 MG tablet, Take 1 tablet by mouth 2 (two) times daily as needed., Disp: ,  Rfl:    aspirin  EC 81 MG EC tablet, Take 1 tablet (81 mg total) by mouth daily., Disp: 30 tablet, Rfl: 11   atorvastatin  (LIPITOR ) 80 MG tablet, Take 1 tablet (80 mg total) by mouth daily., Disp: 90 tablet, Rfl: 3   bisacodyl  (DULCOLAX) 5 MG EC tablet, Take 5 mg by mouth daily as needed for moderate constipation., Disp: , Rfl:    cefdinir (OMNICEF) 300 MG capsule, Take 1 capsule (300 mg total) by mouth 2 (two) times daily., Disp: 14 capsule, Rfl: 0   Coenzyme Q10 200 MG capsule, Take 200 mg by mouth daily., Disp: , Rfl:    cyclobenzaprine  (FLEXERIL ) 5 MG tablet, Take 1 tablet (5 mg total) by mouth 3 (three) times daily as  needed for muscle spasms., Disp: 50 tablet, Rfl: 0   escitalopram  (LEXAPRO ) 5 MG tablet, Take 1 tablet (5 mg total) by mouth daily., Disp: 90 tablet, Rfl: 1   HYDROcodone  bit-homatropine (HYDROMET) 5-1.5 MG/5ML syrup, Take 5 mLs by mouth every 6 (six) hours as needed for cough., Disp: 120 mL, Rfl: 0   nitroGLYCERIN  (NITROSTAT ) 0.4 MG SL tablet, Dissolve 1 tablet under the tongue every 5 minutes as needed for chest pain. Max of 3 doses, then 911., Disp: 25 tablet, Rfl: 5   pantoprazole  (PROTONIX ) 20 MG tablet, Take 1 tablet (20 mg total) by mouth daily., Disp: 90 tablet, Rfl: 1   polyethylene glycol powder (GLYCOLAX /MIRALAX ) powder, Take 17 g by mouth as needed for mild constipation. , Disp: , Rfl:    SYNTHROID  88 MCG tablet, Take 1 tablet (88 mcg total) by mouth daily., Disp: 90 tablet, Rfl: 1   tamsulosin  (FLOMAX ) 0.4 MG CAPS capsule, Take 2 capsules (0.8 mg total) by mouth daily., Disp: 180 capsule, Rfl: 1  Allergies  Allergen Reactions   Contrast Media [Iodinated Contrast Media] Shortness Of Breath and Swelling    Swelling of the throat    Erythromycin Nausea And Vomiting   Niacin And Related Swelling   Penicillins Hives and Other (See Comments)    Has patient had a PCN reaction causing immediate rash, facial/tongue/throat swelling, SOB or lightheadedness with hypotension: Y  Has patient had a PCN reaction causing severe rash involving mucus membranes or skin necrosis: No  Has patient had a PCN reaction that required hospitalization: No  Has patient had a PCN reaction occurring within the last 10 years: No  If all of the above answers are NO, then may proceed with Cephalosporin use.   Gabapentin Swelling    Severe swelling in legs   Gadolinium Derivatives     Other Reaction(s): difficulty breathing   Penicillin G     Other Reaction(s): rash, nausea   ROS neg/noncontributory except as noted HPI/below      Objective:     BP 122/72 (BP Location: Left Arm, Cuff Size: Normal)    Pulse 72   Temp 98.1 F (36.7 C) (Temporal)   Resp 18   Ht 6' (1.829 m)   Wt 213 lb 8 oz (96.8 kg)   SpO2 95%   BMI 28.96 kg/m  Wt Readings from Last 3 Encounters:  01/30/24 213 lb 8 oz (96.8 kg)  12/22/23 220 lb 6.4 oz (100 kg)  09/09/23 227 lb 3.2 oz (103.1 kg)    Physical Exam   Gen: WDWN NAD but tired appearing HEENT: NCAT, conjunctiva not injected, sclera nonicteric TM WNL B, OP moist, no exudates  NECK:  supple, no thyromegaly, no nodes CARDIAC: RRR, S1S2+, no murmur.  LUNGS: CTAB. No wheezes ABDOMEN:  BS+, soft, NTND, No HSM, no masses EXT:  no edema MSK: no gross abnormalities.  NEURO: A&O x3.  CN II-XII intact.  PSYCH: normal mood. Good eye contact     Assessment & Plan:  Fever, unspecified fever cause -     POC COVID-19 BinaxNow -     POCT rapid strep A -     CBC with Differential/Platelet -     Comprehensive metabolic panel with GFR  Chills -     POC COVID-19 BinaxNow -     POCT rapid strep A -     CBC with Differential/Platelet -     Comprehensive metabolic panel with GFR  Nonintractable headache, unspecified chronicity pattern, unspecified headache type -     POC COVID-19 BinaxNow -     POCT rapid strep A  Other orders -     Cefdinir; Take 1 capsule (300 mg total) by mouth 2 (two) times daily.  Dispense: 14 capsule; Refill: 0  Assessment and Plan Assessment & Plan Acute febrile illness   He presents with an acute febrile illness, experiencing high fever (101-102F), chills, and generalized myalgia, without respiratory or gastrointestinal symptoms. Differential diagnosis includes viral or bacterial infection. At 78 years old and with recent healthcare exposure, he is at high risk for infections. Initial hypotension resolved to baseline. Antibiotics were initiated due to high-risk status(omnicef 300mg  bid), despite potential viral etiology. There is a potential allergic reaction to Omnicef due to a penicillin allergy; he should discontinue if rash or  hives develop and take diphenhydramine  if needed. Administer Omnicef with these precautions. Order a CBC to assess for infection. Advise rest, hydration, and acetaminophen  for fever and myalgia as needed. He should isolate for at least 24 hours and avoid contact with his wife to prevent transmission. His son should avoid close contact if visiting his wife. Covid and strep negative  Hypertension   Hypertension is well-controlled with a current reading of 122/72 mmHg.  Goals of Care   He is concerned about transmitting illness to his wife, who is recovering from a traumatic brain injury and surgery. He should avoid visiting until on antibiotics for at least 24 hours and symptom-free. Advise wearing a mask when visiting his wife after recovery and ensure his son maintains distance to prevent potential transmission.    Return if symptoms worsen or fail to improve.  Jenkins CHRISTELLA Carrel, MD

## 2024-01-31 LAB — CBC WITH DIFFERENTIAL/PLATELET
Absolute Lymphocytes: 1045 {cells}/uL (ref 850–3900)
Absolute Monocytes: 1087 {cells}/uL — ABNORMAL HIGH (ref 200–950)
Basophils Absolute: 63 {cells}/uL (ref 0–200)
Basophils Relative: 0.3 %
Eosinophils Absolute: 0 {cells}/uL — ABNORMAL LOW (ref 15–500)
Eosinophils Relative: 0 %
HCT: 43.4 % (ref 38.5–50.0)
Hemoglobin: 14.1 g/dL (ref 13.2–17.1)
MCH: 31.3 pg (ref 27.0–33.0)
MCHC: 32.5 g/dL (ref 32.0–36.0)
MCV: 96.4 fL (ref 80.0–100.0)
MPV: 11.6 fL (ref 7.5–12.5)
Monocytes Relative: 5.2 %
Neutro Abs: 18706 {cells}/uL — ABNORMAL HIGH (ref 1500–7800)
Neutrophils Relative %: 89.5 %
Platelets: 290 10*3/uL (ref 140–400)
RBC: 4.5 10*6/uL (ref 4.20–5.80)
RDW: 12.6 % (ref 11.0–15.0)
Total Lymphocyte: 5 %
WBC: 20.9 10*3/uL — ABNORMAL HIGH (ref 3.8–10.8)

## 2024-01-31 LAB — COMPREHENSIVE METABOLIC PANEL WITH GFR
AG Ratio: 1.9 (calc) (ref 1.0–2.5)
ALT: 36 U/L (ref 9–46)
AST: 20 U/L (ref 10–35)
Albumin: 3.9 g/dL (ref 3.6–5.1)
Alkaline phosphatase (APISO): 95 U/L (ref 35–144)
BUN: 22 mg/dL (ref 7–25)
CO2: 26 mmol/L (ref 20–32)
Calcium: 9 mg/dL (ref 8.6–10.3)
Chloride: 104 mmol/L (ref 98–110)
Creat: 1.01 mg/dL (ref 0.70–1.28)
Globulin: 2.1 g/dL (ref 1.9–3.7)
Glucose, Bld: 130 mg/dL — ABNORMAL HIGH (ref 65–99)
Potassium: 4.2 mmol/L (ref 3.5–5.3)
Sodium: 139 mmol/L (ref 135–146)
Total Bilirubin: 0.7 mg/dL (ref 0.2–1.2)
Total Protein: 6 g/dL — ABNORMAL LOW (ref 6.1–8.1)
eGFR: 76 mL/min/{1.73_m2} (ref >=60)

## 2024-02-01 ENCOUNTER — Other Ambulatory Visit: Payer: Self-pay | Admitting: Family Medicine

## 2024-02-01 ENCOUNTER — Ambulatory Visit: Payer: Self-pay | Admitting: Family Medicine

## 2024-02-01 DIAGNOSIS — D729 Disorder of white blood cells, unspecified: Secondary | ICD-10-CM

## 2024-02-01 DIAGNOSIS — R7303 Prediabetes: Secondary | ICD-10-CM

## 2024-02-01 NOTE — Progress Notes (Signed)
 Lmam at 0845 about labs D/w pt at 1:15pm.  Pt taking omnicef and feeling much better.   No f/c.   Repeat cbcd 1 wk or so as was elevated in 2024 May.   Sugar sl up-not fasting.  Has had A1C predm for years.  Will check A1C.

## 2024-02-05 ENCOUNTER — Other Ambulatory Visit (INDEPENDENT_AMBULATORY_CARE_PROVIDER_SITE_OTHER)

## 2024-02-05 ENCOUNTER — Other Ambulatory Visit

## 2024-02-05 ENCOUNTER — Ambulatory Visit: Payer: Self-pay | Admitting: Family Medicine

## 2024-02-05 DIAGNOSIS — D729 Disorder of white blood cells, unspecified: Secondary | ICD-10-CM | POA: Diagnosis not present

## 2024-02-05 DIAGNOSIS — R7303 Prediabetes: Secondary | ICD-10-CM

## 2024-02-05 LAB — HEMOGLOBIN A1C: Hgb A1c MFr Bld: 6.3 % (ref 4.6–6.5)

## 2024-02-05 LAB — CBC WITH DIFFERENTIAL/PLATELET
Basophils Absolute: 0.1 K/uL (ref 0.0–0.1)
Basophils Relative: 0.9 % (ref 0.0–3.0)
Eosinophils Absolute: 0.1 K/uL (ref 0.0–0.7)
Eosinophils Relative: 1.7 % (ref 0.0–5.0)
HCT: 40.4 % (ref 39.0–52.0)
Hemoglobin: 13.7 g/dL (ref 13.0–17.0)
Lymphocytes Relative: 15.9 % (ref 12.0–46.0)
Lymphs Abs: 1.1 K/uL (ref 0.7–4.0)
MCHC: 33.9 g/dL (ref 30.0–36.0)
MCV: 93 fl (ref 78.0–100.0)
Monocytes Absolute: 0.5 K/uL (ref 0.1–1.0)
Monocytes Relative: 7 % (ref 3.0–12.0)
Neutro Abs: 5 K/uL (ref 1.4–7.7)
Neutrophils Relative %: 74.5 % (ref 43.0–77.0)
Platelets: 294 K/uL (ref 150.0–400.0)
RBC: 4.35 Mil/uL (ref 4.22–5.81)
RDW: 14 % (ref 11.5–15.5)
WBC: 6.8 K/uL (ref 4.0–10.5)

## 2024-02-05 NOTE — Progress Notes (Signed)
 Great!  Cbc back to normal! 2.  A1C(3 month average of sugars) is elevated.  This is considered PreDiabetes.  Work on diet-decrease sugars and starches and aim for 30 minutes of exercise 5 days/week to prevent progression to diabetes  3.  Needs to schedule his f/u w/Stephanie for August

## 2024-02-26 ENCOUNTER — Other Ambulatory Visit: Payer: Self-pay | Admitting: Family

## 2024-02-26 DIAGNOSIS — E039 Hypothyroidism, unspecified: Secondary | ICD-10-CM

## 2024-03-17 ENCOUNTER — Other Ambulatory Visit: Payer: Self-pay | Admitting: Family

## 2024-03-17 DIAGNOSIS — K219 Gastro-esophageal reflux disease without esophagitis: Secondary | ICD-10-CM

## 2024-04-07 ENCOUNTER — Other Ambulatory Visit: Payer: Self-pay | Admitting: Family

## 2024-04-07 DIAGNOSIS — F418 Other specified anxiety disorders: Secondary | ICD-10-CM

## 2024-05-04 NOTE — Progress Notes (Unsigned)
 Cardiology Office Note:    Date:  05/04/2024   ID:  Ryan [name], DOB 1946-05-14, MRN [id number]  PCP:  Lucius Krabbe, NP   [facility] Providers Cardiologist:  Ozell Fell, MD     Referring MD: Lucius Krabbe, NP   No chief complaint on file.   History of Present Illness:    Ryan [name] is a 78 y.o. male with a hx of CAD, presenting for follow-up evaluation.   The patient initially presented in 2018 with a STEMI involving the left circumflex, treated with primary PCI using drug-eluting stent. The patient has been intolerant to beta-blockers because of bradycardia. Lipids have been at goal on a high intensity statin drug.    Current Medications: No outpatient medications have been marked as taking for the 05/05/24 encounter (Appointment) with Fell Ozell, MD.     Allergies:   Contrast media [iodinated contrast media], Erythromycin, Niacin and related, Penicillins, Gabapentin, Gadolinium derivatives, and Penicillin g   ROS:   Please see the history of present illness.    All other systems reviewed and are negative.  EKGs/Labs/Other Studies Reviewed:    The following studies were reviewed today: Cardiac Studies & Procedures   ______________________________________________________________________________________________ CARDIAC CATHETERIZATION  CARDIAC CATHETERIZATION 12/08/2016  Conclusion 1. Severe thrombotic stenosis of the left circumflex treated successfully with Primary PCI using a 3.5x24 mm Promus DES 2. Moderate diffuse LAD, RCA, and ramus intermedius stenoses 3. Normal LV function  Recommend:  Aggressive medical therapy  The patient's residual CAD affects small caliber vessels and will be best managed medically  Aggrastat  x 6 hours  DAPT with ASA and brilinta  x 12 months without interruption  Anticipate discharge 48 hours if no complications  Findings Coronary Findings Diagnostic  Dominance: Right  Left Main Vessel is  angiographically normal.  Left Anterior Descending Vessel is small.  Ramus Intermedius Vessel is small.  Left Circumflex The lesion is eccentric.  Right Coronary Artery  Intervention  Prox Cx lesion Angioplasty Lesion crossed with guidewire using a WIRE COUGAR XT ST. 190CM. Pre-stent angioplasty was performed using a BALLOON MOZEC 2.50X14. A STENT PROMUS PREM MR 3.5X24 drug eluting stent was successfully placed. Post-stent angioplasty was not performed. The pre-interventional distal flow is normal (TIMI 3).  The post-interventional distal flow is normal (TIMI 3). The intervention was successful . No complications occurred at this lesion. An EBU guide is used. Heparin  and aggrastat  are used for anticoagulation. A therapeutic ACT is achieved. The lesion is dilated with a 2.5 mm balloon, then stented with a 3.5x24 mm Promus DES. There is a 0% residual stenosis post intervention.   STRESS TESTS  EXERCISE TOLERANCE TEST (ETT) 06/20/2017  Interpretation Summary  Blood pressure demonstrated a hypertensive response to exercise.  Hypertensive response and mildly reduced exercise tolerance. Otherwise normal ECG stress test.            ______________________________________________________________________________________________      EKG:        Recent Labs: 09/09/2023: TSH 5.96 01/30/2024: ALT 36; BUN 22; Creat 1.01; Potassium 4.2; Sodium 139 02/05/2024: Hemoglobin 13.7; Platelets 294.0  Recent Lipid Panel    Component Value Date/Time   CHOL 117 09/09/2023 1014   CHOL 89 (L) 01/21/2017 0904   TRIG 69.0 09/09/2023 1014   HDL 40.70 09/09/2023 1014   HDL 31 (L) 01/21/2017 0904   CHOLHDL 3 09/09/2023 1014   VLDL 13.8 09/09/2023 1014   LDLCALC 63 09/09/2023 1014   LDLCALC 62 03/28/2020 1055   LDLDIRECT 144.0 05/13/2013  1036     Risk Assessment/Calculations:      No BP recorded.  {Refresh Note OR Click here to enter BP  :1}***         Physical Exam:    VS:  There  were no vitals taken for this visit.    Wt Readings from Last 3 Encounters:  01/30/24 213 lb 8 oz (96.8 kg)  12/22/23 220 lb 6.4 oz (100 kg)  09/09/23 227 lb 3.2 oz (103.1 kg)     GEN: *** Well nourished, well developed in no acute distress HEENT: Normal NECK: No JVD; No carotid bruits LYMPHATICS: No lymphadenopathy CARDIAC: ***RRR, no murmurs, rubs, gallops RESPIRATORY:  Clear to auscultation without rales, wheezing or rhonchi  ABDOMEN: Soft, non-tender, non-distended MUSCULOSKELETAL:  No edema; No deformity  SKIN: Warm and dry NEUROLOGIC:  Alert and oriented x 3 PSYCHIATRIC:  Normal affect   Assessment & Plan Coronary artery disease involving native coronary artery of native heart without angina pectoris  Essential hypertension  Hyperlipidemia LDL goal <70        {Are you ordering a CV Procedure (e.g. stress test, cath, DCCV, TEE, etc)?   Press F2        :[id number]}    Medication Adjustments/Labs and Tests Ordered: Current medicines are reviewed at length with the patient today.  Concerns regarding medicines are outlined above.  No orders of the defined types were placed in this encounter.  No orders of the defined types were placed in this encounter.   There are no Patient Instructions on file for this visit.   Signed, Ozell Fell, MD  05/04/2024 4:19 PM    [facility]

## 2024-05-05 ENCOUNTER — Encounter: Payer: Self-pay | Admitting: Cardiovascular Disease

## 2024-05-05 ENCOUNTER — Ambulatory Visit: Attending: Cardiovascular Disease | Admitting: Cardiovascular Disease

## 2024-05-05 VITALS — BP 130/72 | HR 59 | Ht 72.0 in | Wt 219.8 lb

## 2024-05-05 DIAGNOSIS — E785 Hyperlipidemia, unspecified: Secondary | ICD-10-CM | POA: Diagnosis present

## 2024-05-05 DIAGNOSIS — I2510 Atherosclerotic heart disease of native coronary artery without angina pectoris: Secondary | ICD-10-CM | POA: Diagnosis not present

## 2024-05-05 DIAGNOSIS — I10 Essential (primary) hypertension: Secondary | ICD-10-CM | POA: Insufficient documentation

## 2024-05-05 NOTE — Patient Instructions (Signed)

## 2024-05-05 NOTE — Assessment & Plan Note (Addendum)
 Pt on ASA and atorvastatin . No angina. Exertional dyspnea likely weight-related and unchanged over the past few years.  Follow-up 1 year.

## 2024-05-06 ENCOUNTER — Ambulatory Visit

## 2024-05-06 ENCOUNTER — Ambulatory Visit (INDEPENDENT_AMBULATORY_CARE_PROVIDER_SITE_OTHER): Admitting: Student in an Organized Health Care Education/Training Program

## 2024-05-06 DIAGNOSIS — Z23 Encounter for immunization: Secondary | ICD-10-CM | POA: Diagnosis not present

## 2024-05-06 NOTE — Progress Notes (Signed)
 Pt in for high dose flu vaccine   Injection tolerated well  Vaccine handout given.

## 2024-05-10 ENCOUNTER — Telehealth: Payer: Self-pay

## 2024-05-10 ENCOUNTER — Telehealth: Payer: Self-pay | Admitting: Family

## 2024-05-10 NOTE — Telephone Encounter (Signed)
 Sorry to hear he had a reaction, but he should be better today. Please call Garrel, since original call was Friday night, and ask how he is doing today.

## 2024-05-10 NOTE — Telephone Encounter (Signed)
 Left message to patient letting him know I was calling to see how he is doing since Friday. Office number was left in case he wanted to return call.

## 2024-05-10 NOTE — Telephone Encounter (Signed)
 Copied from CRM [id number]. Topic: General - Other >> May 10, 2024 12:57 PM Ryan [name] HERO wrote: Reason for CRM: Patient returning call from [facility] checking to see how he is doing. Patient said he is doing fine and thanks for checking.  Noted

## 2024-05-10 NOTE — Telephone Encounter (Signed)
 Caller name: Edmar Blankenburg (Pt's emergency contact)  On DPR?: Yes  Call back number: [phone]  Provider they see: Lucius Krabbe, NP  Reason for call: Patient's wife called overnight triage on 10/3 at 8:10pm - Stated that pt came in on 10/2 for wife's appointment with Dr. Mahlon and received his flu vaccine. Pt was experiencing left arm pain with a red circle the size of a grapefruit on it and facial swelling on his left side, which is the same side pt received flu vaccine. Pt stated he did not want to go to the ER. Pt's wife placed some benadryl  cream on his forehead and also gave pt a benadryl  tablet orally. Wife was seeking medical advice from nurse triage and asked if there was anything else that should be done? Office hours were provided at the time of the call.

## 2024-07-05 ENCOUNTER — Encounter: Payer: Self-pay | Admitting: Student in an Organized Health Care Education/Training Program

## 2024-07-05 ENCOUNTER — Ambulatory Visit: Admitting: Student in an Organized Health Care Education/Training Program

## 2024-07-05 VITALS — BP 133/80 | HR 56 | Ht 71.0 in | Wt 222.0 lb

## 2024-07-05 DIAGNOSIS — R7303 Prediabetes: Secondary | ICD-10-CM | POA: Insufficient documentation

## 2024-07-05 DIAGNOSIS — I2510 Atherosclerotic heart disease of native coronary artery without angina pectoris: Secondary | ICD-10-CM

## 2024-07-05 DIAGNOSIS — F39 Unspecified mood [affective] disorder: Secondary | ICD-10-CM

## 2024-07-05 DIAGNOSIS — I10 Essential (primary) hypertension: Secondary | ICD-10-CM

## 2024-07-05 DIAGNOSIS — M48062 Spinal stenosis, lumbar region with neurogenic claudication: Secondary | ICD-10-CM | POA: Diagnosis not present

## 2024-07-05 DIAGNOSIS — E669 Obesity, unspecified: Secondary | ICD-10-CM

## 2024-07-05 DIAGNOSIS — E039 Hypothyroidism, unspecified: Secondary | ICD-10-CM

## 2024-07-05 DIAGNOSIS — R351 Nocturia: Secondary | ICD-10-CM | POA: Diagnosis not present

## 2024-07-05 DIAGNOSIS — N401 Enlarged prostate with lower urinary tract symptoms: Secondary | ICD-10-CM

## 2024-07-05 DIAGNOSIS — E7800 Pure hypercholesterolemia, unspecified: Secondary | ICD-10-CM

## 2024-07-05 DIAGNOSIS — K581 Irritable bowel syndrome with constipation: Secondary | ICD-10-CM

## 2024-07-05 DIAGNOSIS — K589 Irritable bowel syndrome without diarrhea: Secondary | ICD-10-CM | POA: Insufficient documentation

## 2024-07-05 LAB — HEMOGLOBIN A1C: Hgb A1c MFr Bld: 5.9 % (ref 4.6–6.5)

## 2024-07-05 LAB — PSA: PSA: 5.26 ng/mL — ABNORMAL HIGH (ref 0.10–4.00)

## 2024-07-05 LAB — TSH: TSH: 5.86 u[IU]/mL — ABNORMAL HIGH (ref 0.35–5.50)

## 2024-07-05 NOTE — Assessment & Plan Note (Signed)
 Occasional elevated blood pressure is noted, with no antihypertensives prescribed. Blood pressure will continue to be monitored regularly.  May benefit from an ARB in the future.

## 2024-07-05 NOTE — Assessment & Plan Note (Addendum)
 Chronic constipation is managed with Metamucil, Miralax , and occasional laxatives, with possible IBS. Regular bowel movements every 1-2 days are encouraged.  Last colonoscopy was in 2021.  Some small polyps, but no findings of IBD.  Some of this seems to be behavioral, he is not hopeful that changes in his bowel regimen would improve his regularity.  I think he would feel a lot better if we can get him to having bowel movements every 1-2 days.

## 2024-07-05 NOTE — Assessment & Plan Note (Signed)
 Pre-diabetes is monitored to prevent progression. A1c was checked today.

## 2024-07-05 NOTE — Patient Instructions (Signed)
  VISIT SUMMARY: Today, you had a new patient visit to discuss and manage several ongoing health issues, including chronic constipation, back pain, and other conditions. We reviewed your medical history, current medications, and symptoms. Blood tests were conducted to check your cholesterol, thyroid  function, and A1c levels. We also discussed potential changes to your treatment plan to better manage your symptoms.  YOUR PLAN: -CORONARY ARTERY DISEASE: Coronary artery disease is a condition where the blood vessels supplying your heart are narrowed or blocked. You should continue taking aspirin  and atorvastatin  as prescribed. Your cholesterol levels were checked today to ensure they are within the target range.  -CHRONIC CONSTIPATION: Chronic constipation is a condition where you have infrequent bowel movements or difficulty passing stools. You should continue using Metamucil and Miralax  daily, and try to have regular bowel movements every 1-2 days. If your symptoms persist, further evaluation will be considered.  -CHRONIC LOW BACK PAIN WITH LUMBAR SPINAL STENOSIS: Lumbar spinal stenosis is a narrowing of the spaces within your spine, which can cause back pain. Since your pain has increased, we may refer you to a spine specialist for further evaluation.  -BENIGN PROSTATIC HYPERPLASIA (BPH): BPH is an enlarged prostate gland that can cause urinary symptoms. Since Flomax  is not effectively managing your symptoms, we discussed the potential use of Finasteride or Cialis based on your PSA results, which were checked today.  -HYPOTHYROIDISM: Hypothyroidism is a condition where your thyroid  gland does not produce enough hormones. Your thyroid  function tests were checked today to ensure your medication is effectively managing your condition.  -VENTRAL ABDOMINAL HERNIA: A ventral abdominal hernia is a bulge of tissues through an opening in the muscles of your abdomen. Currently, no surgical intervention is  needed, but we will continue to monitor your symptoms.  -HYPERLIPIDEMIA: Hyperlipidemia is having high levels of fats (lipids) in your blood. You should continue taking atorvastatin , and your cholesterol levels were checked today to ensure they are within the target range.  -PRE-DIABETES: Pre-diabetes is a condition where your blood sugar levels are higher than normal but not high enough to be classified as diabetes. Your A1c levels were checked today to monitor your condition and prevent progression to diabetes.  -BORDERLINE HYPERTENSION: Borderline hypertension is when your blood pressure is occasionally higher than normal. We will continue to monitor your blood pressure regularly, but no medication is prescribed at this time.  -GASTROESOPHAGEAL REFLUX DISEASE (GERD): GERD is a condition where stomach acid frequently flows back into the tube connecting your mouth and stomach. You should continue taking pantoprazole  as needed to manage your symptoms.  -GENERAL HEALTH MAINTENANCE: We discussed routine health maintenance and weight ma nagement strategies. Consider weight loss medications like Wegovy or Zepbound if needed.  INSTRUCTIONS: Please follow up with the recommended blood tests and any referrals provided. Continue taking your medications as prescribed and monitor your symptoms. If you experience any new or worsening symptoms, please contact our office.

## 2024-07-05 NOTE — Assessment & Plan Note (Signed)
 Coronary artery disease is managed with aspirin  and atorvastatin , with no recent angina. Continue aspirin  and atorvastatin . Cholesterol levels were checked today.

## 2024-07-05 NOTE — Assessment & Plan Note (Signed)
 Long-standing hypothyroidism is managed with medication, with a family history noted. Thyroid  function tests were checked today.

## 2024-07-05 NOTE — Assessment & Plan Note (Signed)
 BPH presents with nocturia and incomplete emptying. Flomax  is inadequate. Finasteride, Cialis, and TURP risks were discussed. PSA level was checked today. Finasteride or Cialis will be considered based on PSA results.  Last PSA in February was elevated above 4.  Will check again today, if it is rising will get an MRI to look for high risk nodules as the cause of his worsening lower urinary tract symptoms.

## 2024-07-05 NOTE — Assessment & Plan Note (Signed)
 Chronic and stable.  Has had number of interventions on his low back including injections, ablation, and surgery.  Currently pretty functional but limited by intermittent low back discomfort.  I think of his back pain is worsening would recommend a round of physical therapy.

## 2024-07-05 NOTE — Progress Notes (Signed)
 Established Patient Office Visit  Patient ID: Ryan [name], male    DOB: 1946-02-23  Age: 78 y.o. MRN: [id number] PCP: Jerrell Cleatus Ned, MD  Chief Complaint  Patient presents with   Transitions Of Care    Patient is having pain on the right side of abdomin     Subjective:     HPI  Discussed the use of AI scribe software for clinical note transcription with the patient, who gave verbal consent to proceed.  History of Present Illness Ryan [name] is a 78 year old male who presents for a new patient visit and management of chronic constipation and back pain. He is accompanied by his wife.  He experiences persistent left-sided abdominal pain, which began around the time his wife fell. Initially, he thought it was a pulled muscle. He also has irregular bowel movements, often going a week without a bowel movement despite daily use of Metamucil and Miralax . Occasionally, he uses a laxative pill and a suppository to relieve constipation. He has a history of chronic constipation and underwent a colonoscopy in 2021, during which a few polyps were removed.  He has a history of coronary artery disease, having experienced a heart attack in May 2018, which led to the placement of a stent in his left circumflex artery. He recalls the event occurring while playing golf, initially mistaking the symptoms for indigestion. He is currently on aspirin  and atorvastatin  for management. No recent chest pain or pressure, but he reports indigestion and uses pantoprazole  daily, sometimes twice a day.  He underwent back surgery two to three years ago to relieve nerve pressure, which was successful. However, he reports ongoing back pain, particularly across the base of his spine, which limits his physical activities. He previously received injections and nerve ablation, which provided relief. He experiences significant limitations in walking and standing, impacting his ability to play golf.  He has  been on thyroid  medication since his twenties, with a family history of thyroid  issues. He experiences fatigue, often feeling sleepy during the day, which he attributes to his thyroid  condition.  He uses Flomax  for urinary symptoms but reports it is not effectively managing his symptoms, as he wakes up two to three times a night to urinate and does not feel he is emptying his bladder completely.  He has a history of elevated blood pressure, with a recent reading of 164/90 at a dental visit, although he notes it usually returns to the 130s range. He has not been on blood pressure medication.  He has a history of elevated cholesterol, which was managed with atorvastatin , resulting in improved levels. He also mentions a family history of lung cancer, with his father having died from it after working in shipyards during the war.     Objective:     BP 133/80   Pulse (!) 56   Ht 5' 11 (1.803 m)   Wt 222 lb (100.7 kg)   SpO2 99%   BMI 30.96 kg/m   Physical Exam  Gen: Well-appearing older man, mildly frail appearing Ears: Normal tympanic membranes Neck: Normal thyroid , no nodules or adenopathy Heart: Regular, no murmur Lungs: Unlabored, clear throughout Abd: Soft, large midline ventral hernia, no tenderness Ext: Warm, no edema, normal joints    Assessment & Plan:   Problem List Items Addressed This Visit       High   CAD (coronary artery disease) (Chronic)   Coronary artery disease is managed with aspirin  and atorvastatin , with no  recent angina. Continue aspirin  and atorvastatin . Cholesterol levels were checked today.      Spinal stenosis of lumbar region with neurogenic claudication - Primary (Chronic)   Chronic and stable.  Has had number of interventions on his low back including injections, ablation, and surgery.  Currently pretty functional but limited by intermittent low back discomfort.  I think of his back pain is worsening would recommend a round of physical therapy.       Mood disorder (Chronic)   Obesity (BMI 30-39.9) (Chronic)     Medium    Hypothyroidism (Chronic)   Long-standing hypothyroidism is managed with medication, with a family history noted. Thyroid  function tests were checked today.      Relevant Orders   TSH   BPH associated with nocturia (Chronic)   BPH presents with nocturia and incomplete emptying. Flomax  is inadequate. Finasteride, Cialis, and TURP risks were discussed. PSA level was checked today. Finasteride or Cialis will be considered based on PSA results.  Last PSA in February was elevated above 4.  Will check again today, if it is rising will get an MRI to look for high risk nodules as the cause of his worsening lower urinary tract symptoms.      Relevant Orders   PSA   Hypertension (Chronic)   Occasional elevated blood pressure is noted, with no antihypertensives prescribed. Blood pressure will continue to be monitored regularly.  May benefit from an ARB in the future.      Relevant Orders   Basic metabolic panel with GFR   IBS (irritable bowel syndrome) (Chronic)   Chronic constipation is managed with Metamucil, Miralax , and occasional laxatives, with possible IBS. Regular bowel movements every 1-2 days are encouraged.  Last colonoscopy was in 2021.  Some small polyps, but no findings of IBD.  Some of this seems to be behavioral, he is not hopeful that changes in his bowel regimen would improve his regularity.  I think he would feel a lot better if we can get him to having bowel movements every 1-2 days.      Hypercholesterolemia (Chronic)   Relevant Orders   Lipid panel   Prediabetes   Pre-diabetes is monitored to prevent progression. A1c was checked today.      Relevant Orders   Hemoglobin A1c    Return in about 3 months (around 10/03/2024).    Cleatus Debby Specking, MD [facility]

## 2024-07-06 ENCOUNTER — Ambulatory Visit: Payer: Self-pay | Admitting: Student in an Organized Health Care Education/Training Program

## 2024-07-06 DIAGNOSIS — R972 Elevated prostate specific antigen [PSA]: Secondary | ICD-10-CM

## 2024-07-06 LAB — BASIC METABOLIC PANEL WITH GFR
BUN: 18 mg/dL (ref 6–23)
CO2: 29 meq/L (ref 19–32)
Calcium: 9.4 mg/dL (ref 8.4–10.5)
Chloride: 101 meq/L (ref 96–112)
Creatinine, Ser: 0.83 mg/dL (ref 0.40–1.50)
GFR: 83.1 mL/min (ref >60.00)
Glucose, Bld: 76 mg/dL (ref 70–99)
Potassium: 4.3 meq/L (ref 3.5–5.1)
Sodium: 139 meq/L (ref 135–145)

## 2024-07-06 LAB — LIPID PANEL
Cholesterol: 107 mg/dL (ref 0–200)
HDL: 38.1 mg/dL — ABNORMAL LOW (ref >39.00)
LDL Cholesterol: 56 mg/dL (ref 0–99)
NonHDL: 69.12
Total CHOL/HDL Ratio: 3
Triglycerides: 64 mg/dL (ref 0.0–149.0)
VLDL: 12.8 mg/dL (ref 0.0–40.0)

## 2024-07-06 NOTE — Telephone Encounter (Signed)
 Patient would like to complete any further testing needed

## 2024-07-06 NOTE — Telephone Encounter (Signed)
 Patient has another question for you - please see message below.

## 2024-07-21 ENCOUNTER — Encounter (HOSPITAL_COMMUNITY): Payer: Self-pay

## 2024-07-21 ENCOUNTER — Ambulatory Visit (HOSPITAL_COMMUNITY)
Admission: RE | Admit: 2024-07-21 | Discharge: 2024-07-21 | Attending: Student in an Organized Health Care Education/Training Program

## 2024-07-21 DIAGNOSIS — R972 Elevated prostate specific antigen [PSA]: Secondary | ICD-10-CM

## 2024-07-26 ENCOUNTER — Telehealth: Payer: Self-pay

## 2024-07-26 NOTE — Telephone Encounter (Signed)
 Copied from CRM #[id number]. Topic: General - Other >> Jul 26, 2024  1:38 PM Victoria A wrote: Patient called back to speak with Manuelita however she was busy at the time. Patient said the MRI appointment is not scheduled due to him needing the Benadryl  prescribed. Please contact patient [phone]

## 2024-07-26 NOTE — Telephone Encounter (Signed)
 Copied from CRM #[id number]. Topic: General - Other >> Jul 26, 2024 10:44 AM Burnard DEL wrote: Reason for CRM: Patient called in stating that when he went to get his MRI they let him know that he is allergic to the contrast dye. He stated tat they told him to reach out to PCP to get prescribed some benadryl . Patient would like to now if he could have a prescription for this.  [id number] - [place], [place] - [address] AT [facility] [address]  Phone: [phone] Fax: [phone]

## 2024-07-26 NOTE — Telephone Encounter (Signed)
 Will defer to PCP since he ordered contrast MRI.  Typically they need to follow an entire protocol prior to contrast administration and not just benadryl .

## 2024-07-26 NOTE — Telephone Encounter (Signed)
 Patient is needing benadryl  prescribed before completing and scheduling MRI due to being allergic to contrast dye. Patient stated when trying to go have MRI completed they told him he could not have this done until the benadryl  prescription was prescribed and needed to reach out to pcp.   Can patient have benadryl  prescribed?

## 2024-07-27 NOTE — Progress Notes (Signed)
 Please see other phone note

## 2024-07-27 NOTE — Telephone Encounter (Signed)
 Contrast is not essential for this MRI of the prostate. I have changed the order to be an MRI without contrast, which should be effective and is recommended over pretreatment of contrast allergies. Please apologize for me to the patient for the confusion and rescheduling. I know he is anxious to complete this test.

## 2024-07-27 NOTE — Telephone Encounter (Signed)
Called patient and verbalized understanding  

## 2024-08-02 ENCOUNTER — Ambulatory Visit (HOSPITAL_COMMUNITY)
Admission: RE | Admit: 2024-08-02 | Discharge: 2024-08-02 | Disposition: A | Discharge status: Home / Self Care | Source: Ambulatory Visit | Attending: Student in an Organized Health Care Education/Training Program | Admitting: Student in an Organized Health Care Education/Training Program

## 2024-08-02 ENCOUNTER — Other Ambulatory Visit (HOSPITAL_COMMUNITY): Payer: Self-pay

## 2024-08-02 DIAGNOSIS — R972 Elevated prostate specific antigen [PSA]: Secondary | ICD-10-CM | POA: Diagnosis present

## 2024-08-11 ENCOUNTER — Ambulatory Visit: Payer: Self-pay | Admitting: Student in an Organized Health Care Education/Training Program

## 2024-08-11 DIAGNOSIS — R972 Elevated prostate specific antigen [PSA]: Secondary | ICD-10-CM

## 2024-08-15 ENCOUNTER — Other Ambulatory Visit: Payer: Self-pay | Admitting: Family

## 2024-08-15 DIAGNOSIS — N401 Enlarged prostate with lower urinary tract symptoms: Secondary | ICD-10-CM

## 2024-08-19 ENCOUNTER — Telehealth: Payer: Self-pay

## 2024-08-19 NOTE — Telephone Encounter (Signed)
 Copied from CRM [id number]. Topic: Appointments - Scheduling Inquiry for Clinic >> Aug 19, 2024 11:39 AM Ryan [name] wrote: Reason for CRM: Patient wants to schedule a physical and not a AWV. Requesting a call to get scheduled.  Patient can be reached at [phone]

## 2024-08-19 NOTE — Telephone Encounter (Signed)
 Called patient to discuss, no answer LM to call back. At this time, per Medicare, we cannot do a standard physical it will not be covered/ paid for but we can do an exam where we cover many of the same things and do lab work it just would not be coded to billing as a standard annual exam   If there is still question about this I would advise he reaches out to his insurance directly and verify with them.    Otherwise okay to schedule Office visit with comment medicare physical this should not be scheduled back to back with another standard physical, welcome to medicare or a hospital follow up.

## 2024-08-20 NOTE — Telephone Encounter (Signed)
 Called patient again to discuss previous message no answer, LM to call back

## 2024-08-24 NOTE — Telephone Encounter (Signed)
 Called patient and explained to him medicare do not cover regular physicals, just the AWV. He inquired about getting blood work. I explained to him when he see his doctor depend on what the visit is about the doctor will schedule blood work.

## 2024-08-30 ENCOUNTER — Other Ambulatory Visit: Payer: Self-pay | Admitting: Student in an Organized Health Care Education/Training Program

## 2024-08-30 DIAGNOSIS — E039 Hypothyroidism, unspecified: Secondary | ICD-10-CM

## 2024-09-14 ENCOUNTER — Ambulatory Visit: Payer: Medicare Other

## 2024-10-04 ENCOUNTER — Ambulatory Visit: Admitting: Student in an Organized Health Care Education/Training Program
